# Patient Record
Sex: Female | Born: 1945
Health system: Southern US, Community
[De-identification: ages and names within clinical notes are randomized; demographics above are authoritative.]

## PROBLEM LIST (undated history)

## (undated) DIAGNOSIS — R1310 Dysphagia, unspecified: Secondary | ICD-10-CM

## (undated) DIAGNOSIS — I10 Essential (primary) hypertension: Secondary | ICD-10-CM

## (undated) DIAGNOSIS — Z9889 Other specified postprocedural states: Secondary | ICD-10-CM

## (undated) DIAGNOSIS — K219 Gastro-esophageal reflux disease without esophagitis: Secondary | ICD-10-CM

## (undated) DIAGNOSIS — R7303 Prediabetes: Secondary | ICD-10-CM

## (undated) DIAGNOSIS — R112 Nausea with vomiting, unspecified: Secondary | ICD-10-CM

## (undated) DIAGNOSIS — R7301 Impaired fasting glucose: Secondary | ICD-10-CM

## (undated) DIAGNOSIS — Z923 Personal history of irradiation: Secondary | ICD-10-CM

## (undated) DIAGNOSIS — C159 Malignant neoplasm of esophagus, unspecified: Secondary | ICD-10-CM

## (undated) DIAGNOSIS — E785 Hyperlipidemia, unspecified: Secondary | ICD-10-CM

## (undated) DIAGNOSIS — C50919 Malignant neoplasm of unspecified site of unspecified female breast: Secondary | ICD-10-CM

## (undated) HISTORY — PX: DIRECT LARYNGOSCOPY WITH BOTOX INJECTION: SHX5327

## (undated) HISTORY — DX: Gastro-esophageal reflux disease without esophagitis: K21.9

## (undated) HISTORY — PX: EYE SURGERY: SHX253

## (undated) HISTORY — DX: Hyperlipidemia, unspecified: E78.5

## (undated) HISTORY — DX: Essential (primary) hypertension: I10

## (undated) HISTORY — DX: Impaired fasting glucose: R73.01

## (undated) HISTORY — PX: ESOPHAGUS SURGERY: SHX626

## (undated) HISTORY — DX: Malignant neoplasm of esophagus, unspecified: C15.9

---

## 2011-01-14 ENCOUNTER — Other Ambulatory Visit: Payer: Self-pay | Admitting: Hematology & Oncology

## 2011-01-14 ENCOUNTER — Ambulatory Visit (HOSPITAL_BASED_OUTPATIENT_CLINIC_OR_DEPARTMENT_OTHER): Payer: Medicare Other | Admitting: Hematology & Oncology

## 2011-01-14 ENCOUNTER — Ambulatory Visit (HOSPITAL_BASED_OUTPATIENT_CLINIC_OR_DEPARTMENT_OTHER)
Admission: RE | Admit: 2011-01-14 | Discharge: 2011-01-14 | Disposition: A | Discharge status: Home / Self Care | Payer: Medicare Other | Source: Ambulatory Visit | Attending: Hematology & Oncology | Admitting: Hematology & Oncology

## 2011-01-14 DIAGNOSIS — C155 Malignant neoplasm of lower third of esophagus: Secondary | ICD-10-CM

## 2011-01-14 DIAGNOSIS — N838 Other noninflammatory disorders of ovary, fallopian tube and broad ligament: Secondary | ICD-10-CM

## 2011-01-14 DIAGNOSIS — C159 Malignant neoplasm of esophagus, unspecified: Secondary | ICD-10-CM | POA: Insufficient documentation

## 2011-01-14 DIAGNOSIS — N839 Noninflammatory disorder of ovary, fallopian tube and broad ligament, unspecified: Secondary | ICD-10-CM

## 2011-01-14 DIAGNOSIS — C160 Malignant neoplasm of cardia: Secondary | ICD-10-CM

## 2011-01-14 LAB — CBC WITH DIFFERENTIAL (CANCER CENTER ONLY)
BASO#: 0 10*3/uL (ref 0.0–0.2)
BASO%: 0.3 % (ref 0.0–2.0)
EOS%: 1.1 % (ref 0.0–7.0)
Eosinophils Absolute: 0.1 10*3/uL (ref 0.0–0.5)
HCT: 34.3 % — ABNORMAL LOW (ref 34.8–46.6)
HGB: 11.1 g/dL — ABNORMAL LOW (ref 11.6–15.9)
LYMPH#: 1.8 10*3/uL (ref 0.9–3.3)
LYMPH%: 19.2 % (ref 14.0–48.0)
MCH: 26.6 pg (ref 26.0–34.0)
MCHC: 32.4 g/dL (ref 32.0–36.0)
MCV: 82 fL (ref 81–101)
MONO#: 1 10*3/uL — ABNORMAL HIGH (ref 0.1–0.9)
MONO%: 10.5 % (ref 0.0–13.0)
NEUT#: 6.5 10*3/uL (ref 1.5–6.5)
NEUT%: 68.9 % (ref 39.6–80.0)
Platelets: 353 10*3/uL (ref 145–400)
RBC: 4.17 10*6/uL (ref 3.70–5.32)
RDW: 15.2 % (ref 11.1–15.7)
WBC: 9.5 10*3/uL (ref 3.9–10.0)

## 2011-01-14 LAB — COMPREHENSIVE METABOLIC PANEL
ALT: 16 U/L (ref 0–35)
AST: 19 U/L (ref 0–37)
Albumin: 4.5 g/dL (ref 3.5–5.2)
Alkaline Phosphatase: 50 U/L (ref 39–117)
BUN: 19 mg/dL (ref 6–23)
CO2: 26 mEq/L (ref 19–32)
Calcium: 10.3 mg/dL (ref 8.4–10.5)
Chloride: 101 mEq/L (ref 96–112)
Creatinine, Ser: 0.89 mg/dL (ref 0.40–1.20)
Glucose, Bld: 127 mg/dL — ABNORMAL HIGH (ref 70–99)
Potassium: 3.9 mEq/L (ref 3.5–5.3)
Sodium: 137 mEq/L (ref 135–145)
Total Bilirubin: 0.3 mg/dL (ref 0.3–1.2)
Total Protein: 7.5 g/dL (ref 6.0–8.3)

## 2011-01-14 LAB — PREALBUMIN: Prealbumin: 26.1 mg/dL (ref 17.0–34.0)

## 2011-01-14 LAB — CA 125: CA 125: 12.6 U/mL (ref 0.0–30.2)

## 2011-01-14 LAB — LACTATE DEHYDROGENASE: LDH: 178 U/L (ref 94–250)

## 2011-01-14 LAB — CEA: CEA: 0.7 ng/mL (ref 0.0–5.0)

## 2011-01-20 ENCOUNTER — Encounter (HOSPITAL_COMMUNITY): Payer: Self-pay

## 2011-01-20 ENCOUNTER — Encounter (HOSPITAL_COMMUNITY)
Admission: RE | Admit: 2011-01-20 | Discharge: 2011-01-20 | Disposition: A | Discharge status: Home / Self Care | Payer: Medicare Other | Source: Ambulatory Visit | Attending: Hematology & Oncology | Admitting: Hematology & Oncology

## 2011-01-20 DIAGNOSIS — C159 Malignant neoplasm of esophagus, unspecified: Secondary | ICD-10-CM | POA: Insufficient documentation

## 2011-01-20 LAB — GLUCOSE, CAPILLARY: Glucose-Capillary: 118 mg/dL — ABNORMAL HIGH (ref 70–99)

## 2011-01-20 MED ORDER — FLUDEOXYGLUCOSE F - 18 (FDG) INJECTION
16.1000 | Freq: Once | INTRAVENOUS | Status: AC | PRN
  Administered 2011-01-20: 16.1 via INTRAVENOUS

## 2011-01-22 ENCOUNTER — Encounter (HOSPITAL_BASED_OUTPATIENT_CLINIC_OR_DEPARTMENT_OTHER): Payer: Medicare Other | Admitting: Oncology

## 2011-01-22 DIAGNOSIS — C155 Malignant neoplasm of lower third of esophagus: Secondary | ICD-10-CM

## 2011-01-27 ENCOUNTER — Telehealth: Payer: Self-pay

## 2011-01-27 DIAGNOSIS — C159 Malignant neoplasm of esophagus, unspecified: Secondary | ICD-10-CM

## 2011-01-27 NOTE — Telephone Encounter (Signed)
Spoke with the pt and she is aware of the EUS and meds were reviewed.  The instructions mistakenly was noted as being on Thurs but the pt ws instructed to arrive on 01/30/11 Friday.  She will call with any further questions

## 2011-01-27 NOTE — Telephone Encounter (Signed)
Pt scheduled for EUS need to instruct and review meds

## 2011-01-29 ENCOUNTER — Ambulatory Visit: Payer: Medicare Other | Attending: Radiation Oncology | Admitting: Radiation Oncology

## 2011-01-29 DIAGNOSIS — R1110 Vomiting, unspecified: Secondary | ICD-10-CM | POA: Insufficient documentation

## 2011-01-29 DIAGNOSIS — T63481A Toxic effect of venom of other arthropod, accidental (unintentional), initial encounter: Secondary | ICD-10-CM | POA: Insufficient documentation

## 2011-01-29 DIAGNOSIS — Z510 Encounter for antineoplastic radiation therapy: Secondary | ICD-10-CM | POA: Insufficient documentation

## 2011-01-29 DIAGNOSIS — C155 Malignant neoplasm of lower third of esophagus: Secondary | ICD-10-CM | POA: Insufficient documentation

## 2011-01-29 DIAGNOSIS — R21 Rash and other nonspecific skin eruption: Secondary | ICD-10-CM | POA: Insufficient documentation

## 2011-01-29 DIAGNOSIS — F411 Generalized anxiety disorder: Secondary | ICD-10-CM | POA: Insufficient documentation

## 2011-01-29 DIAGNOSIS — T6391XA Toxic effect of contact with unspecified venomous animal, accidental (unintentional), initial encounter: Secondary | ICD-10-CM | POA: Insufficient documentation

## 2011-01-30 ENCOUNTER — Encounter: Payer: Self-pay | Admitting: Gastroenterology

## 2011-01-30 ENCOUNTER — Ambulatory Visit (HOSPITAL_COMMUNITY)
Admission: RE | Admit: 2011-01-30 | Discharge: 2011-01-30 | Disposition: A | Discharge status: Home / Self Care | Payer: Medicare Other | Source: Ambulatory Visit | Attending: Gastroenterology | Admitting: Gastroenterology

## 2011-01-30 ENCOUNTER — Encounter: Payer: Medicare Other | Admitting: Gastroenterology

## 2011-01-30 DIAGNOSIS — C159 Malignant neoplasm of esophagus, unspecified: Secondary | ICD-10-CM

## 2011-01-30 DIAGNOSIS — K449 Diaphragmatic hernia without obstruction or gangrene: Secondary | ICD-10-CM

## 2011-01-30 DIAGNOSIS — Z79899 Other long term (current) drug therapy: Secondary | ICD-10-CM | POA: Insufficient documentation

## 2011-01-30 DIAGNOSIS — C155 Malignant neoplasm of lower third of esophagus: Secondary | ICD-10-CM | POA: Insufficient documentation

## 2011-02-02 ENCOUNTER — Encounter (HOSPITAL_BASED_OUTPATIENT_CLINIC_OR_DEPARTMENT_OTHER): Payer: Medicare Other | Admitting: Oncology

## 2011-02-02 DIAGNOSIS — R1310 Dysphagia, unspecified: Secondary | ICD-10-CM

## 2011-02-02 DIAGNOSIS — D500 Iron deficiency anemia secondary to blood loss (chronic): Secondary | ICD-10-CM

## 2011-02-02 DIAGNOSIS — K922 Gastrointestinal hemorrhage, unspecified: Secondary | ICD-10-CM

## 2011-02-02 DIAGNOSIS — C160 Malignant neoplasm of cardia: Secondary | ICD-10-CM

## 2011-02-03 NOTE — Procedures (Signed)
Summary: Endoscopic Ultrasound  Patient: Briana Briana Craig Briana Craig Note: All result statuses are Final unless otherwise noted.  Tests: (1) Endoscopic Ultrasound (EUS)  EUS Endoscopic Ultrasound                             DONE     [facility]     [address] [place], [place]  [address]          ENDOSCOPIC ULTRASOUND PROCEDURE REPORT          PATIENT:  Briana Briana Craig, Briana Craig  MR#:  [id number]     BIRTHDATE:  [birth year]  GENDER:  female     ENDOSCOPIST:  Rachael Fee, MD     REFERRED BY:  Lavada Mesi. Truett Perna, M.D.     PROCEDURE DATE:  01/30/2011     PROCEDURE:  Upper EUS     ASA CLASS:  Class II     INDICATIONS:  recently diagnosed esophageal adenocarcinoma by EGD     Dr. Nance Pew; PET and CT scan show no clear metastatic disease          MEDICATIONS:  Fentanyl 100 mcg IV, Versed 7 mg IV          DESCRIPTION OF PROCEDURE:   After the risks benefits and     alternatives of the procedure were  explained, informed consent     was obtained. The patient was then placed in the left, lateral,     decubitus postion and IV sedation was administered. Throughout the     procedure, the patient's blood pressure, pulse and oxygen     saturations were monitored continuously.  Under direct     visualization, the Pentax EUS Radial [id number] endoscope was     introduced through the mouth and advanced to the stomach body.     Water was used as necessary to provide an acoustic interface.  Upon     completion of the imaging, water was removed and the patient was     sent to the recovery room in satisfactory condition.          <<PROCEDUREIMAGES>>          Endoscopic findings:     1. 8cm long, circumferential, friable, bulky mass in esophagus     from GE junction (36cm) up to 28cm from incisors     2. 2cm hiatal hernia     3. Otherwise normal stomach          EUS findings:     1. The mass above corresponded with a 11mm thick heterogeneous,     predominantly hypoechoic mass that clearly  passes into and through     the muscularis propria layer of the esophageal wall (uT3).     2. The was a round, well circumscribed 10.55mm diameter,     homogeneous lymphnode abutting the esophagus at the proximal     aspect of the mass (uN1).     3. No celiac adenopathy.          Impression:     uT3N1 8cm long, 11mm thick, circumferential esophageal     adenocarcinoma that extends from GE junction to 28cm from     incisors. She will likely benefit from neoadjuvant chemo/xrt.          ______________________________     Rachael Fee, MD          cc: Hal Neer, MD; Mancel Bale,  MD; Nance Pew, MD          n.     eSIGNED:   Rachael Fee at 01/30/2011 12:36 PM          [name], [id number]  Note: An exclamation mark (!) indicates a result that was not dispersed into the flowsheet. Document Creation Date: 01/30/2011 12:47 PM _______________________________________________________________________  (1) Order result status: Final Collection or observation date-time: 01/30/2011 12:19 Requested date-time:  Receipt date-time:  Reported date-time:  Referring Physician:   Ordering Physician: Rob Bunting [phone]) Specimen Source:  Source: Launa Grill Order Number: [id number] Lab site:

## 2011-02-05 ENCOUNTER — Encounter: Payer: Self-pay | Admitting: Gastroenterology

## 2011-02-10 ENCOUNTER — Encounter (HOSPITAL_BASED_OUTPATIENT_CLINIC_OR_DEPARTMENT_OTHER): Payer: Medicare Other | Admitting: Oncology

## 2011-02-10 ENCOUNTER — Other Ambulatory Visit: Payer: Self-pay | Admitting: Oncology

## 2011-02-10 DIAGNOSIS — C155 Malignant neoplasm of lower third of esophagus: Secondary | ICD-10-CM

## 2011-02-10 LAB — COMPREHENSIVE METABOLIC PANEL
ALT: 18 U/L (ref 0–35)
AST: 19 U/L (ref 0–37)
Albumin: 4.3 g/dL (ref 3.5–5.2)
Alkaline Phosphatase: 43 U/L (ref 39–117)
BUN: 21 mg/dL (ref 6–23)
CO2: 26 mEq/L (ref 19–32)
Calcium: 10.2 mg/dL (ref 8.4–10.5)
Chloride: 102 mEq/L (ref 96–112)
Creatinine, Ser: 0.9 mg/dL (ref 0.40–1.20)
Glucose, Bld: 121 mg/dL — ABNORMAL HIGH (ref 70–99)
Potassium: 4.3 mEq/L (ref 3.5–5.3)
Sodium: 138 mEq/L (ref 135–145)
Total Bilirubin: 0.3 mg/dL (ref 0.3–1.2)
Total Protein: 7.2 g/dL (ref 6.0–8.3)

## 2011-02-10 LAB — CEA: CEA: 0.6 ng/mL (ref 0.0–5.0)

## 2011-02-12 LAB — FERRITIN: Ferritin: 14 ng/mL (ref 10–291)

## 2011-02-16 ENCOUNTER — Encounter (HOSPITAL_BASED_OUTPATIENT_CLINIC_OR_DEPARTMENT_OTHER): Payer: Medicare Other | Admitting: Oncology

## 2011-02-16 DIAGNOSIS — C160 Malignant neoplasm of cardia: Secondary | ICD-10-CM

## 2011-02-16 DIAGNOSIS — Z5111 Encounter for antineoplastic chemotherapy: Secondary | ICD-10-CM

## 2011-02-19 ENCOUNTER — Emergency Department (HOSPITAL_COMMUNITY): Payer: Medicare Other

## 2011-02-19 ENCOUNTER — Emergency Department (HOSPITAL_COMMUNITY)
Admission: EM | Admit: 2011-02-19 | Discharge: 2011-02-19 | Disposition: A | Discharge status: Home / Self Care | Payer: Medicare Other | Attending: Emergency Medicine | Admitting: Emergency Medicine

## 2011-02-19 DIAGNOSIS — C159 Malignant neoplasm of esophagus, unspecified: Secondary | ICD-10-CM | POA: Insufficient documentation

## 2011-02-19 DIAGNOSIS — I10 Essential (primary) hypertension: Secondary | ICD-10-CM | POA: Insufficient documentation

## 2011-02-19 DIAGNOSIS — Z79899 Other long term (current) drug therapy: Secondary | ICD-10-CM | POA: Insufficient documentation

## 2011-02-19 DIAGNOSIS — R000 Tachycardia, unspecified: Secondary | ICD-10-CM | POA: Insufficient documentation

## 2011-02-19 DIAGNOSIS — R079 Chest pain, unspecified: Secondary | ICD-10-CM | POA: Insufficient documentation

## 2011-02-19 DIAGNOSIS — M542 Cervicalgia: Secondary | ICD-10-CM | POA: Insufficient documentation

## 2011-02-19 DIAGNOSIS — M899 Disorder of bone, unspecified: Secondary | ICD-10-CM | POA: Insufficient documentation

## 2011-02-19 DIAGNOSIS — K219 Gastro-esophageal reflux disease without esophagitis: Secondary | ICD-10-CM | POA: Insufficient documentation

## 2011-02-19 DIAGNOSIS — F411 Generalized anxiety disorder: Secondary | ICD-10-CM | POA: Insufficient documentation

## 2011-02-19 LAB — DIFFERENTIAL
Basophils Absolute: 0 10*3/uL (ref 0.0–0.1)
Basophils Relative: 0 % (ref 0–1)
Eosinophils Absolute: 0.2 10*3/uL (ref 0.0–0.7)
Eosinophils Relative: 2 % (ref 0–5)
Lymphocytes Relative: 9 % — ABNORMAL LOW (ref 12–46)
Lymphs Abs: 0.8 10*3/uL (ref 0.7–4.0)
Monocytes Absolute: 0.6 10*3/uL (ref 0.1–1.0)
Monocytes Relative: 7 % (ref 3–12)
Neutro Abs: 7.4 10*3/uL (ref 1.7–7.7)
Neutrophils Relative %: 82 % — ABNORMAL HIGH (ref 43–77)

## 2011-02-19 LAB — POCT I-STAT, CHEM 8
BUN: 18 mg/dL (ref 6–23)
Calcium, Ion: 1.24 mmol/L (ref 1.12–1.32)
Chloride: 102 mEq/L (ref 96–112)
Creatinine, Ser: 0.9 mg/dL (ref 0.4–1.2)
Glucose, Bld: 175 mg/dL — ABNORMAL HIGH (ref 70–99)
HCT: 44 % (ref 36.0–46.0)
Hemoglobin: 15 g/dL (ref 12.0–15.0)
Potassium: 3.9 mEq/L (ref 3.5–5.1)
Sodium: 137 mEq/L (ref 135–145)
TCO2: 25 mmol/L (ref 0–100)

## 2011-02-19 LAB — CBC
HCT: 40 % (ref 36.0–46.0)
Hemoglobin: 13.4 g/dL (ref 12.0–15.0)
MCH: 27.3 pg (ref 26.0–34.0)
MCHC: 33.5 g/dL (ref 30.0–36.0)
MCV: 81.6 fL (ref 78.0–100.0)
Platelets: 325 10*3/uL (ref 150–400)
RBC: 4.9 MIL/uL (ref 3.87–5.11)
RDW: 17.1 % — ABNORMAL HIGH (ref 11.5–15.5)
WBC: 9 10*3/uL (ref 4.0–10.5)

## 2011-02-19 LAB — BRAIN NATRIURETIC PEPTIDE: Pro B Natriuretic peptide (BNP): <30 pg/mL (ref 0.0–100.0)

## 2011-02-19 LAB — COMPREHENSIVE METABOLIC PANEL
ALT: 25 U/L (ref 0–35)
AST: 21 U/L (ref 0–37)
Albumin: 3.8 g/dL (ref 3.5–5.2)
Alkaline Phosphatase: 50 U/L (ref 39–117)
BUN: 16 mg/dL (ref 6–23)
CO2: 26 mEq/L (ref 19–32)
Calcium: 9.7 mg/dL (ref 8.4–10.5)
Chloride: 100 mEq/L (ref 96–112)
Creatinine, Ser: 0.78 mg/dL (ref 0.4–1.2)
GFR calc Af Amer: >60 mL/min (ref >60)
GFR calc non Af Amer: >60 mL/min (ref >60)
Glucose, Bld: 166 mg/dL — ABNORMAL HIGH (ref 70–99)
Potassium: 3.8 mEq/L (ref 3.5–5.1)
Sodium: 135 mEq/L (ref 135–145)
Total Bilirubin: 0.7 mg/dL (ref 0.3–1.2)
Total Protein: 7.7 g/dL (ref 6.0–8.3)

## 2011-02-19 LAB — URINALYSIS, ROUTINE W REFLEX MICROSCOPIC
Bilirubin Urine: NEGATIVE
Glucose, UA: NEGATIVE mg/dL
Hgb urine dipstick: NEGATIVE
Ketones, ur: NEGATIVE mg/dL
Nitrite: NEGATIVE
Protein, ur: NEGATIVE mg/dL
Specific Gravity, Urine: 1.011 (ref 1.005–1.030)
Urobilinogen, UA: 0.2 mg/dL (ref 0.0–1.0)
pH: 6.5 (ref 5.0–8.0)

## 2011-02-19 LAB — CK TOTAL AND CKMB (NOT AT ARMC)
CK, MB: 1.2 ng/mL (ref 0.3–4.0)
CK, MB: 1 ng/mL (ref 0.3–4.0)
Relative Index: INVALID (ref 0.0–2.5)
Relative Index: INVALID (ref 0.0–2.5)
Total CK: 32 U/L (ref 7–177)
Total CK: 27 U/L (ref 7–177)

## 2011-02-19 LAB — POCT CARDIAC MARKERS
CKMB, poc: 1.9 ng/mL (ref 1.0–8.0)
Myoglobin, poc: 46.3 ng/mL (ref 12–200)
Troponin i, poc: <0.05 ng/mL (ref 0.00–0.09)

## 2011-02-19 LAB — D-DIMER, QUANTITATIVE: D-Dimer, Quant: 0.79 ug/mL-FEU — ABNORMAL HIGH (ref 0.00–0.48)

## 2011-02-19 LAB — TROPONIN I
Troponin I: 0.01 ng/mL (ref 0.00–0.06)
Troponin I: 0.01 ng/mL (ref 0.00–0.06)

## 2011-02-19 LAB — LIPASE, BLOOD: Lipase: 122 U/L — ABNORMAL HIGH (ref 11–59)

## 2011-02-19 MED ORDER — IOHEXOL 300 MG/ML  SOLN
100.0000 mL | Freq: Once | INTRAMUSCULAR | Status: AC | PRN
  Administered 2011-02-19: 100 mL via INTRAVENOUS

## 2011-02-23 ENCOUNTER — Encounter (HOSPITAL_BASED_OUTPATIENT_CLINIC_OR_DEPARTMENT_OTHER): Payer: Medicare Other | Admitting: Oncology

## 2011-02-23 ENCOUNTER — Other Ambulatory Visit: Payer: Self-pay | Admitting: Oncology

## 2011-02-23 DIAGNOSIS — C155 Malignant neoplasm of lower third of esophagus: Secondary | ICD-10-CM

## 2011-02-23 DIAGNOSIS — Z5111 Encounter for antineoplastic chemotherapy: Secondary | ICD-10-CM

## 2011-02-23 DIAGNOSIS — C160 Malignant neoplasm of cardia: Secondary | ICD-10-CM

## 2011-02-23 DIAGNOSIS — D500 Iron deficiency anemia secondary to blood loss (chronic): Secondary | ICD-10-CM

## 2011-02-23 LAB — CBC WITH DIFFERENTIAL/PLATELET
BASO%: 0.3 % (ref 0.0–2.0)
Basophils Absolute: 0 10*3/uL (ref 0.0–0.1)
EOS%: 5.1 % (ref 0.0–7.0)
Eosinophils Absolute: 0.2 10*3/uL (ref 0.0–0.5)
HCT: 34.9 % (ref 34.8–46.6)
HGB: 11.8 g/dL (ref 11.6–15.9)
LYMPH%: 16.8 % (ref 14.0–49.7)
MCH: 27.1 pg (ref 25.1–34.0)
MCHC: 33.8 g/dL (ref 31.5–36.0)
MCV: 80 fL (ref 79.5–101.0)
MONO#: 0.4 10*3/uL (ref 0.1–0.9)
MONO%: 9.9 % (ref 0.0–14.0)
NEUT#: 2.6 10*3/uL (ref 1.5–6.5)
NEUT%: 67.9 % (ref 38.4–76.8)
Platelets: 262 10*3/uL (ref 145–400)
RBC: 4.36 10*6/uL (ref 3.70–5.45)
RDW: 16.5 % — ABNORMAL HIGH (ref 11.2–14.5)
WBC: 3.8 10*3/uL — ABNORMAL LOW (ref 3.9–10.3)
lymph#: 0.6 10*3/uL — ABNORMAL LOW (ref 0.9–3.3)
nRBC: 0 % (ref 0–0)

## 2011-02-23 LAB — COMPREHENSIVE METABOLIC PANEL
ALT: 21 U/L (ref 0–35)
AST: 23 U/L (ref 0–37)
Albumin: 3.8 g/dL (ref 3.5–5.2)
Alkaline Phosphatase: 42 U/L (ref 39–117)
BUN: 19 mg/dL (ref 6–23)
CO2: 26 mEq/L (ref 19–32)
Calcium: 9.2 mg/dL (ref 8.4–10.5)
Chloride: 103 mEq/L (ref 96–112)
Creatinine, Ser: 0.9 mg/dL (ref 0.40–1.20)
Glucose, Bld: 170 mg/dL — ABNORMAL HIGH (ref 70–99)
Potassium: 3.9 mEq/L (ref 3.5–5.3)
Sodium: 136 mEq/L (ref 135–145)
Total Bilirubin: 0.7 mg/dL (ref 0.3–1.2)
Total Protein: 7.3 g/dL (ref 6.0–8.3)

## 2011-02-26 ENCOUNTER — Encounter: Payer: Medicare Other | Admitting: Cardiothoracic Surgery

## 2011-03-02 ENCOUNTER — Other Ambulatory Visit: Payer: Self-pay | Admitting: Oncology

## 2011-03-02 ENCOUNTER — Encounter (HOSPITAL_BASED_OUTPATIENT_CLINIC_OR_DEPARTMENT_OTHER): Payer: Medicare Other | Admitting: Oncology

## 2011-03-02 DIAGNOSIS — Z5111 Encounter for antineoplastic chemotherapy: Secondary | ICD-10-CM

## 2011-03-02 DIAGNOSIS — N839 Noninflammatory disorder of ovary, fallopian tube and broad ligament, unspecified: Secondary | ICD-10-CM

## 2011-03-02 DIAGNOSIS — C160 Malignant neoplasm of cardia: Secondary | ICD-10-CM

## 2011-03-02 DIAGNOSIS — C155 Malignant neoplasm of lower third of esophagus: Secondary | ICD-10-CM

## 2011-03-02 LAB — CBC WITH DIFFERENTIAL/PLATELET
BASO%: 0.4 % (ref 0.0–2.0)
Basophils Absolute: 0 10*3/uL (ref 0.0–0.1)
EOS%: 5.1 % (ref 0.0–7.0)
Eosinophils Absolute: 0.1 10*3/uL (ref 0.0–0.5)
HCT: 34.4 % — ABNORMAL LOW (ref 34.8–46.6)
HGB: 11.4 g/dL — ABNORMAL LOW (ref 11.6–15.9)
LYMPH%: 18 % (ref 14.0–49.7)
MCH: 27.1 pg (ref 25.1–34.0)
MCHC: 33.1 g/dL (ref 31.5–36.0)
MCV: 81.7 fL (ref 79.5–101.0)
MONO#: 0.4 10*3/uL (ref 0.1–0.9)
MONO%: 15.7 % — ABNORMAL HIGH (ref 0.0–14.0)
NEUT#: 1.6 10*3/uL (ref 1.5–6.5)
NEUT%: 60.8 % (ref 38.4–76.8)
Platelets: 198 10*3/uL (ref 145–400)
RBC: 4.21 10*6/uL (ref 3.70–5.45)
RDW: 16.8 % — ABNORMAL HIGH (ref 11.2–14.5)
WBC: 2.6 10*3/uL — ABNORMAL LOW (ref 3.9–10.3)
lymph#: 0.5 10*3/uL — ABNORMAL LOW (ref 0.9–3.3)
nRBC: 0 % (ref 0–0)

## 2011-03-09 ENCOUNTER — Encounter (HOSPITAL_BASED_OUTPATIENT_CLINIC_OR_DEPARTMENT_OTHER): Payer: Medicare Other | Admitting: Oncology

## 2011-03-09 ENCOUNTER — Other Ambulatory Visit: Payer: Self-pay | Admitting: Oncology

## 2011-03-09 DIAGNOSIS — N839 Noninflammatory disorder of ovary, fallopian tube and broad ligament, unspecified: Secondary | ICD-10-CM

## 2011-03-09 DIAGNOSIS — Z5111 Encounter for antineoplastic chemotherapy: Secondary | ICD-10-CM

## 2011-03-09 DIAGNOSIS — D500 Iron deficiency anemia secondary to blood loss (chronic): Secondary | ICD-10-CM

## 2011-03-09 DIAGNOSIS — C160 Malignant neoplasm of cardia: Secondary | ICD-10-CM

## 2011-03-09 DIAGNOSIS — C155 Malignant neoplasm of lower third of esophagus: Secondary | ICD-10-CM

## 2011-03-09 LAB — CBC WITH DIFFERENTIAL/PLATELET
BASO%: 0.2 % (ref 0.0–2.0)
Basophils Absolute: 0 10*3/uL (ref 0.0–0.1)
EOS%: 2.8 % (ref 0.0–7.0)
Eosinophils Absolute: 0.1 10*3/uL (ref 0.0–0.5)
HCT: 36.4 % (ref 34.8–46.6)
HGB: 12.3 g/dL (ref 11.6–15.9)
LYMPH%: 14.3 % (ref 14.0–49.7)
MCH: 27.5 pg (ref 25.1–34.0)
MCHC: 33.8 g/dL (ref 31.5–36.0)
MCV: 81.4 fL (ref 79.5–101.0)
MONO#: 0.4 10*3/uL (ref 0.1–0.9)
MONO%: 9.9 % (ref 0.0–14.0)
NEUT#: 3.2 10*3/uL (ref 1.5–6.5)
NEUT%: 72.8 % (ref 38.4–76.8)
Platelets: 156 10*3/uL (ref 145–400)
RBC: 4.47 10*6/uL (ref 3.70–5.45)
RDW: 17.2 % — ABNORMAL HIGH (ref 11.2–14.5)
WBC: 4.4 10*3/uL (ref 3.9–10.3)
lymph#: 0.6 10*3/uL — ABNORMAL LOW (ref 0.9–3.3)
nRBC: 0 % (ref 0–0)

## 2011-03-09 LAB — COMPREHENSIVE METABOLIC PANEL
ALT: 36 U/L — ABNORMAL HIGH (ref 0–35)
AST: 31 U/L (ref 0–37)
Albumin: 3.6 g/dL (ref 3.5–5.2)
Alkaline Phosphatase: 48 U/L (ref 39–117)
BUN: 15 mg/dL (ref 6–23)
CO2: 26 mEq/L (ref 19–32)
Calcium: 9.6 mg/dL (ref 8.4–10.5)
Chloride: 101 mEq/L (ref 96–112)
Creatinine, Ser: 0.69 mg/dL (ref 0.40–1.20)
Glucose, Bld: 136 mg/dL — ABNORMAL HIGH (ref 70–99)
Potassium: 3.6 mEq/L (ref 3.5–5.3)
Sodium: 134 mEq/L — ABNORMAL LOW (ref 135–145)
Total Bilirubin: 0.3 mg/dL (ref 0.3–1.2)
Total Protein: 7.5 g/dL (ref 6.0–8.3)

## 2011-03-16 ENCOUNTER — Encounter (HOSPITAL_BASED_OUTPATIENT_CLINIC_OR_DEPARTMENT_OTHER): Payer: Medicare Other | Admitting: Oncology

## 2011-03-16 ENCOUNTER — Other Ambulatory Visit: Payer: Self-pay | Admitting: Oncology

## 2011-03-16 DIAGNOSIS — N839 Noninflammatory disorder of ovary, fallopian tube and broad ligament, unspecified: Secondary | ICD-10-CM

## 2011-03-16 DIAGNOSIS — C155 Malignant neoplasm of lower third of esophagus: Secondary | ICD-10-CM

## 2011-03-16 DIAGNOSIS — Z5111 Encounter for antineoplastic chemotherapy: Secondary | ICD-10-CM

## 2011-03-16 DIAGNOSIS — C160 Malignant neoplasm of cardia: Secondary | ICD-10-CM

## 2011-03-16 DIAGNOSIS — D509 Iron deficiency anemia, unspecified: Secondary | ICD-10-CM

## 2011-03-16 LAB — CBC WITH DIFFERENTIAL/PLATELET
BASO%: 1 % (ref 0.0–2.0)
Basophils Absolute: 0 10*3/uL (ref 0.0–0.1)
EOS%: 5.1 % (ref 0.0–7.0)
Eosinophils Absolute: 0.2 10*3/uL (ref 0.0–0.5)
HCT: 36.2 % (ref 34.8–46.6)
HGB: 12.3 g/dL (ref 11.6–15.9)
LYMPH%: 17 % (ref 14.0–49.7)
MCH: 27.5 pg (ref 25.1–34.0)
MCHC: 34 g/dL (ref 31.5–36.0)
MCV: 80.8 fL (ref 79.5–101.0)
MONO#: 0.5 10*3/uL (ref 0.1–0.9)
MONO%: 16.7 % — ABNORMAL HIGH (ref 0.0–14.0)
NEUT#: 1.8 10*3/uL (ref 1.5–6.5)
NEUT%: 60.2 % (ref 38.4–76.8)
Platelets: 109 10*3/uL — ABNORMAL LOW (ref 145–400)
RBC: 4.48 10*6/uL (ref 3.70–5.45)
RDW: 17 % — ABNORMAL HIGH (ref 11.2–14.5)
WBC: 2.9 10*3/uL — ABNORMAL LOW (ref 3.9–10.3)
lymph#: 0.5 10*3/uL — ABNORMAL LOW (ref 0.9–3.3)
nRBC: 0 % (ref 0–0)

## 2011-03-20 NOTE — Consult Note (Signed)
Briana Craig NO.:  [id number]  MEDICAL RECORD NO.:  [id number]           PATIENT TYPE:  E  LOCATION:  WLED                         FACILITY:  [facility]  PHYSICIAN:  Andreas Blower, MD       DATE OF BIRTH:  09-28-1946  DATE OF CONSULTATION:  02/19/2011 DATE OF DISCHARGE:                                CONSULTATION   PRIMARY CARE PHYSICIAN:  At [facility].  ONCOLOGIST:  Dr. Rolm Baptise.  REASON FOR CONSULTATION:  Chest pain.  HISTORY OF PRESENT ILLNESS:  Briana Craig is a 65 year old female, currently undergoing treatment for esophageal cancer who presented to [facility] Emergency Room today with complaints of chest pain.  The patient states she woke at approximately midnight last evening with midsternal chest "tightness."  The patient states pain radiated to the right side of her chest, her right side of her back and both sides of her neck.  The patient denied any associated nausea, vomiting, diaphoresis, or shortness of breath.  She denies any recent abdominal pain.  The patient states the pain lasted approximately 10 minutes and relieved without any intervention.  The patient mentioned episode to oncologist who sent her here for further evaluation.  Of note, the patient is currently undergoing weekly chemotherapy on Mondays as well as radiation, Monday through Friday for esophageal cancer.  Risk factors for acute coronary syndrome include hypertension and hypercholesterolemia.  PAST MEDICAL HISTORY: 1. Hypertension. 2. Esophageal cancer, currently undergoing chemotherapy and radiation. 3. Hypercholesterolemia. 4. Hyperglycemia. 5. GERD. 6. Osteopenia.  MEDICATIONS: 1. Ferrous sulfate 325 mg p.o. daily. 2. Protonix 40 mg p.o. daily. 3. Senokot 1 to 2 tablets p.o. q.h.s. 4. Omega-3 acid over-the-counter p.o. daily. 5. Calcium 500 mg p.o. b.i.d. 6. Lipitor 20 mg p.o. q.h.s. 7. Losartan/HCTZ 100/12.5 mg p.o. q.a.m. 8.  Multivitamins p.o. daily. 9. CoQ10 enzyme p.o. daily.  ALLERGIES: 1. CODEINE. 2. PENICILLIN.  FAMILY HISTORY:  Father deceased at age 60 with COPD and colon cancer. Mother deceased at age 40 with abdominal aortic aneurysm.  The patient has 1 brother with hypertension.  She denies any family history of coronary disease.  SOCIAL HISTORY:  The patient is married.  She is currently retired.  She and her husband just recently moved to the area from [place].  She has a remote history of tobacco abuse.  She denies any EtOH use.  REVIEW OF SYSTEMS:  As stated in HPI, otherwise negative.  PHYSICAL EXAMINATION:  VITAL SIGNS:  Blood pressure 124/80, heart rate 91, respirations 18, temperature 98.0, and O2 saturation is 95% on room air. GENERAL:  This is a well-nourished and well-developed Caucasian female, sitting on side of bed in no acute distress. HEAD:  Normocephalic and atraumatic. EYES:  Extraocular movements are intact without scleral icterus or injection. EARS, NOSE, AND THROAT:  Mucous membranes are moist.  No oropharyngeal lesions are noted. NECK:  Supple with no thyromegaly or lymphadenopathy.  No JVD or carotid bruits. CHEST:  With symmetrical movement, nontender to palpation. CARDIOVASCULAR:  S1 and S2, regular rate and rhythm.  No murmur, rub, or gallop.  No lower  extremity edema. RESPIRATORY:  Lungs sounds are clear to auscultation bilaterally.  No wheezes, rales, or crackles.  No increased work of breathing. GI:  Abdomen is soft, nontender, and nondistended with positive bowel sounds.  No appreciated masses or hepatosplenomegaly. NEUROLOGICAL:  The patient is able to move all extremities x4 without motor sensory deficit on exam. PSYCHOLOGICAL:  The patient is alert and oriented x4 with very pleasant mood and affect.  PERTINENT LABORATORY DATA:  White cell count 9.0, platelet count 325, hemoglobin 13.4, and hematocrit 40.0.  Sodium 135, potassium 3.8, chloride 100, CO2  of 26, BUN 16, creatinine 0.78, and glucose 166. Liver function tests within normal limits.  Albumin 3.8.  Point of care marker negative x1.  BNP less than 30.  Lipase 122.  Chest x-ray with no acute findings.  EKG showing sinus rhythm at 103 beats per minute with no ischemic changes.  CT angio of the chest is negative for pulmonary emboli, small nodules in the right middle lobe seen previously on recent PET scan.  ASSESSMENT AND PLAN: 1. Chest pain.  Again, the patient presents with both typical and     atypical symptoms for acute coronary syndrome.  Risk factors     include hypertension and hypercholesterolemia.  The patient's TIMI     score is quite low.  As mentioned above, EKG and cardiac point of     care markers are unremarkable at the time of admission evaluation.     Given the patient's history of cancer, it was asked that the D-     dimer and subsequent CT angio of the chest was checked to rule out     pulmonary embolism.  These tests have been negative at this time.     The patient's pain occurred approximately 12 hours prior to     admission evaluation; therefore, I would expect elevated troponin     should the patient be experiencing an ischemic event. We will  ask emergency room staff to check a set of cardiac enzymes now and     again in 6 hours to determine if any elevation.  If workup done in     the emergency room remains negative, the patient is felt stable for     discharge home with followup with primary care physician.  The     patient has been instructed to follow up with her primary care     physician in approximately 1 to 2 weeks for possible outpatient 2-D     echocardiogram.  The patient's symptoms could be related to pain     from the esophageal mass and current radiation therapy. 2. Esophageal cancer.  Continue regimen per Dr. Truett Perna. 3. History of hypertension has remained stable.  Continue medications     as prior to this evaluation. 4. Elevated  lipase.  Suspect related to chemotherapy agent.  The     patient has not had any recent nausea, vomiting, or abdominal pain. 5. Disposition.  At this time, second set of cardiac enzymes are     pending.  Again, the patient is felt medically stable for discharge     home should second set of enzymes be negative.  The patient is     instructed to follow with primary care physician in 1 to 2 weeks.     The patient and husband verify understanding of these instructions.     Cordelia Pen, NP   ______________________________ Andreas Blower, MD    LE/MEDQ  D:  02/19/2011  T:  02/19/2011  Job:  [id number]  cc:   [facility].  Electronically Signed by Cordelia Pen NP on 03/19/2011 02:23:40 PM Electronically Signed by Wardell Heath Rawn Quiroa  on 03/20/2011 08:17:00 PM

## 2011-03-23 ENCOUNTER — Encounter (HOSPITAL_BASED_OUTPATIENT_CLINIC_OR_DEPARTMENT_OTHER): Payer: Medicare Other | Admitting: Oncology

## 2011-03-23 ENCOUNTER — Other Ambulatory Visit: Payer: Self-pay | Admitting: Oncology

## 2011-03-23 DIAGNOSIS — D6959 Other secondary thrombocytopenia: Secondary | ICD-10-CM

## 2011-03-23 DIAGNOSIS — R1310 Dysphagia, unspecified: Secondary | ICD-10-CM

## 2011-03-23 DIAGNOSIS — D500 Iron deficiency anemia secondary to blood loss (chronic): Secondary | ICD-10-CM

## 2011-03-23 DIAGNOSIS — C160 Malignant neoplasm of cardia: Secondary | ICD-10-CM

## 2011-03-23 DIAGNOSIS — D509 Iron deficiency anemia, unspecified: Secondary | ICD-10-CM

## 2011-03-23 DIAGNOSIS — C155 Malignant neoplasm of lower third of esophagus: Secondary | ICD-10-CM

## 2011-03-23 DIAGNOSIS — N839 Noninflammatory disorder of ovary, fallopian tube and broad ligament, unspecified: Secondary | ICD-10-CM

## 2011-03-23 LAB — CBC WITH DIFFERENTIAL/PLATELET
BASO%: 0.4 % (ref 0.0–2.0)
Basophils Absolute: 0 10*3/uL (ref 0.0–0.1)
EOS%: 2.4 % (ref 0.0–7.0)
Eosinophils Absolute: 0.1 10*3/uL (ref 0.0–0.5)
HCT: 36.4 % (ref 34.8–46.6)
HGB: 12.6 g/dL (ref 11.6–15.9)
LYMPH%: 10.8 % — ABNORMAL LOW (ref 14.0–49.7)
MCH: 27.9 pg (ref 25.1–34.0)
MCHC: 34.6 g/dL (ref 31.5–36.0)
MCV: 80.5 fL (ref 79.5–101.0)
MONO#: 0.3 10*3/uL (ref 0.1–0.9)
MONO%: 13.3 % (ref 0.0–14.0)
NEUT#: 1.8 10*3/uL (ref 1.5–6.5)
NEUT%: 73.1 % (ref 38.4–76.8)
Platelets: 73 10*3/uL — ABNORMAL LOW (ref 145–400)
RBC: 4.52 10*6/uL (ref 3.70–5.45)
RDW: 17.2 % — ABNORMAL HIGH (ref 11.2–14.5)
WBC: 2.5 10*3/uL — ABNORMAL LOW (ref 3.9–10.3)
lymph#: 0.3 10*3/uL — ABNORMAL LOW (ref 0.9–3.3)
nRBC: 0 % (ref 0–0)

## 2011-03-25 ENCOUNTER — Other Ambulatory Visit: Payer: Self-pay | Admitting: Oncology

## 2011-03-25 ENCOUNTER — Encounter: Payer: Medicare Other | Admitting: Oncology

## 2011-03-25 LAB — CBC WITH DIFFERENTIAL/PLATELET
BASO%: 0.4 % (ref 0.0–2.0)
Basophils Absolute: 0 10*3/uL (ref 0.0–0.1)
EOS%: 2.1 % (ref 0.0–7.0)
Eosinophils Absolute: 0.1 10*3/uL (ref 0.0–0.5)
HCT: 33.2 % — ABNORMAL LOW (ref 34.8–46.6)
HGB: 11.5 g/dL — ABNORMAL LOW (ref 11.6–15.9)
LYMPH%: 18.5 % (ref 14.0–49.7)
MCH: 27.9 pg (ref 25.1–34.0)
MCHC: 34.6 g/dL (ref 31.5–36.0)
MCV: 80.6 fL (ref 79.5–101.0)
MONO#: 0.3 10*3/uL (ref 0.1–0.9)
MONO%: 14.2 % — ABNORMAL HIGH (ref 0.0–14.0)
NEUT#: 1.5 10*3/uL (ref 1.5–6.5)
NEUT%: 64.8 % (ref 38.4–76.8)
Platelets: 73 10*3/uL — ABNORMAL LOW (ref 145–400)
RBC: 4.12 10*6/uL (ref 3.70–5.45)
RDW: 17.3 % — ABNORMAL HIGH (ref 11.2–14.5)
WBC: 2.3 10*3/uL — ABNORMAL LOW (ref 3.9–10.3)
lymph#: 0.4 10*3/uL — ABNORMAL LOW (ref 0.9–3.3)
nRBC: 0 % (ref 0–0)

## 2011-04-02 ENCOUNTER — Encounter (HOSPITAL_BASED_OUTPATIENT_CLINIC_OR_DEPARTMENT_OTHER): Payer: Medicare Other | Admitting: Oncology

## 2011-04-02 ENCOUNTER — Other Ambulatory Visit: Payer: Self-pay | Admitting: Oncology

## 2011-04-02 DIAGNOSIS — Z5111 Encounter for antineoplastic chemotherapy: Secondary | ICD-10-CM

## 2011-04-02 DIAGNOSIS — C155 Malignant neoplasm of lower third of esophagus: Secondary | ICD-10-CM

## 2011-04-02 LAB — CBC WITH DIFFERENTIAL/PLATELET
BASO%: 0.5 % (ref 0.0–2.0)
Basophils Absolute: 0 10*3/uL (ref 0.0–0.1)
EOS%: 3.3 % (ref 0.0–7.0)
Eosinophils Absolute: 0.1 10*3/uL (ref 0.0–0.5)
HCT: 34.9 % (ref 34.8–46.6)
HGB: 12 g/dL (ref 11.6–15.9)
LYMPH%: 26.6 % (ref 14.0–49.7)
MCH: 28.2 pg (ref 25.1–34.0)
MCHC: 34.4 g/dL (ref 31.5–36.0)
MCV: 81.9 fL (ref 79.5–101.0)
MONO#: 0.5 10*3/uL (ref 0.1–0.9)
MONO%: 28.3 % — ABNORMAL HIGH (ref 0.0–14.0)
NEUT#: 0.8 10*3/uL — ABNORMAL LOW (ref 1.5–6.5)
NEUT%: 41.3 % (ref 38.4–76.8)
Platelets: 114 10*3/uL — ABNORMAL LOW (ref 145–400)
RBC: 4.26 10*6/uL (ref 3.70–5.45)
RDW: 18.7 % — ABNORMAL HIGH (ref 11.2–14.5)
WBC: 1.8 10*3/uL — ABNORMAL LOW (ref 3.9–10.3)
lymph#: 0.5 10*3/uL — ABNORMAL LOW (ref 0.9–3.3)

## 2011-04-08 ENCOUNTER — Other Ambulatory Visit: Payer: Self-pay | Admitting: Oncology

## 2011-04-08 ENCOUNTER — Encounter (HOSPITAL_BASED_OUTPATIENT_CLINIC_OR_DEPARTMENT_OTHER): Payer: Medicare Other | Admitting: Oncology

## 2011-04-08 DIAGNOSIS — D509 Iron deficiency anemia, unspecified: Secondary | ICD-10-CM

## 2011-04-08 DIAGNOSIS — C160 Malignant neoplasm of cardia: Secondary | ICD-10-CM

## 2011-04-08 DIAGNOSIS — N839 Noninflammatory disorder of ovary, fallopian tube and broad ligament, unspecified: Secondary | ICD-10-CM

## 2011-04-08 DIAGNOSIS — D6959 Other secondary thrombocytopenia: Secondary | ICD-10-CM

## 2011-04-08 DIAGNOSIS — I10 Essential (primary) hypertension: Secondary | ICD-10-CM

## 2011-04-08 DIAGNOSIS — D500 Iron deficiency anemia secondary to blood loss (chronic): Secondary | ICD-10-CM

## 2011-04-08 DIAGNOSIS — C155 Malignant neoplasm of lower third of esophagus: Secondary | ICD-10-CM

## 2011-04-08 LAB — CBC WITH DIFFERENTIAL/PLATELET
BASO%: 0.2 % (ref 0.0–2.0)
Basophils Absolute: 0 10*3/uL (ref 0.0–0.1)
EOS%: 1.5 % (ref 0.0–7.0)
Eosinophils Absolute: 0.1 10*3/uL (ref 0.0–0.5)
HCT: 33.5 % — ABNORMAL LOW (ref 34.8–46.6)
HGB: 11.7 g/dL (ref 11.6–15.9)
LYMPH%: 32.9 % (ref 14.0–49.7)
MCH: 28.6 pg (ref 25.1–34.0)
MCHC: 34.9 g/dL (ref 31.5–36.0)
MCV: 81.9 fL (ref 79.5–101.0)
MONO#: 1.3 10*3/uL — ABNORMAL HIGH (ref 0.1–0.9)
MONO%: 23.8 % — ABNORMAL HIGH (ref 0.0–14.0)
NEUT#: 2.3 10*3/uL (ref 1.5–6.5)
NEUT%: 41.6 % (ref 38.4–76.8)
Platelets: 177 10*3/uL (ref 145–400)
RBC: 4.09 10*6/uL (ref 3.70–5.45)
RDW: 19.6 % — ABNORMAL HIGH (ref 11.2–14.5)
WBC: 5.4 10*3/uL (ref 3.9–10.3)
lymph#: 1.8 10*3/uL (ref 0.9–3.3)

## 2011-04-23 ENCOUNTER — Ambulatory Visit
Admission: RE | Admit: 2011-04-23 | Discharge: 2011-04-23 | Disposition: A | Discharge status: Home / Self Care | Payer: Medicare Other | Source: Ambulatory Visit | Attending: Radiation Oncology | Admitting: Radiation Oncology

## 2011-06-15 ENCOUNTER — Encounter (HOSPITAL_BASED_OUTPATIENT_CLINIC_OR_DEPARTMENT_OTHER): Payer: Medicare Other | Admitting: Oncology

## 2011-06-15 DIAGNOSIS — C160 Malignant neoplasm of cardia: Secondary | ICD-10-CM

## 2011-06-15 DIAGNOSIS — D500 Iron deficiency anemia secondary to blood loss (chronic): Secondary | ICD-10-CM

## 2011-06-15 DIAGNOSIS — I10 Essential (primary) hypertension: Secondary | ICD-10-CM

## 2011-06-15 DIAGNOSIS — D6959 Other secondary thrombocytopenia: Secondary | ICD-10-CM

## 2011-07-16 ENCOUNTER — Ambulatory Visit
Admission: RE | Admit: 2011-07-16 | Discharge: 2011-07-16 | Disposition: A | Discharge status: Home / Self Care | Payer: Medicare Other | Source: Ambulatory Visit | Attending: Radiation Oncology | Admitting: Radiation Oncology

## 2011-08-12 ENCOUNTER — Encounter (HOSPITAL_COMMUNITY)
Admission: RE | Admit: 2011-08-12 | Discharge: 2011-08-12 | Disposition: A | Discharge status: Home / Self Care | Payer: Medicare Other | Source: Ambulatory Visit | Attending: Otolaryngology | Admitting: Otolaryngology

## 2011-08-12 LAB — CBC
HCT: 36.5 % (ref 36.0–46.0)
Hemoglobin: 12.1 g/dL (ref 12.0–15.0)
MCH: 26.9 pg (ref 26.0–34.0)
MCHC: 33.2 g/dL (ref 30.0–36.0)
MCV: 81.3 fL (ref 78.0–100.0)
Platelets: 235 10*3/uL (ref 150–400)
RBC: 4.49 MIL/uL (ref 3.87–5.11)
RDW: 14.8 % (ref 11.5–15.5)
WBC: 4.4 10*3/uL (ref 4.0–10.5)

## 2011-08-12 LAB — BASIC METABOLIC PANEL
BUN: 12 mg/dL (ref 6–23)
CO2: 26 mEq/L (ref 19–32)
Calcium: 10.2 mg/dL (ref 8.4–10.5)
Chloride: 105 mEq/L (ref 96–112)
Creatinine, Ser: 0.73 mg/dL (ref 0.50–1.10)
GFR calc Af Amer: >90 mL/min (ref >90)
GFR calc non Af Amer: 87 mL/min — ABNORMAL LOW (ref >90)
Glucose, Bld: 111 mg/dL — ABNORMAL HIGH (ref 70–99)
Potassium: 4.4 mEq/L (ref 3.5–5.1)
Sodium: 140 mEq/L (ref 135–145)

## 2011-08-17 ENCOUNTER — Ambulatory Visit (HOSPITAL_COMMUNITY)
Admission: RE | Admit: 2011-08-17 | Discharge: 2011-08-17 | Disposition: A | Discharge status: Home / Self Care | Payer: Medicare Other | Source: Ambulatory Visit | Attending: Otolaryngology | Admitting: Otolaryngology

## 2011-08-17 DIAGNOSIS — R498 Other voice and resonance disorders: Secondary | ICD-10-CM | POA: Insufficient documentation

## 2011-08-17 DIAGNOSIS — J3801 Paralysis of vocal cords and larynx, unilateral: Secondary | ICD-10-CM | POA: Insufficient documentation

## 2011-08-17 DIAGNOSIS — Z01812 Encounter for preprocedural laboratory examination: Secondary | ICD-10-CM | POA: Insufficient documentation

## 2011-08-17 DIAGNOSIS — Z8501 Personal history of malignant neoplasm of esophagus: Secondary | ICD-10-CM | POA: Insufficient documentation

## 2011-08-17 LAB — GLUCOSE, CAPILLARY
Glucose-Capillary: 102 mg/dL — ABNORMAL HIGH (ref 70–99)
Glucose-Capillary: 118 mg/dL — ABNORMAL HIGH (ref 70–99)

## 2011-08-20 NOTE — Op Note (Signed)
  NAMESTACI, DACK NO.:  [id number]  MEDICAL RECORD NO.:  [id number]  LOCATION:  [facility]SC                         FACILITY:  [facility]  PHYSICIAN:  Antony Contras, MD     DATE OF BIRTH:  April 14, 1946  DATE OF PROCEDURE:  08/17/2011 DATE OF DISCHARGE:                              OPERATIVE REPORT   PREOPERATIVE DIAGNOSIS:  Left vocal cord paralysis and hoarseness.  POSTOPERATIVE DIAGNOSIS:  Left vocal cord paralysis and hoarseness.  PROCEDURE:  Suspended microdirect laryngoscopy with the radius vocal fold injections.  SURGEON:  Antony Contras, MD  ANESTHESIA:  General jet Venturi ventilation.  COMPLICATIONS:  None.  INDICATION:  The patient is a 65 year old white female who underwent an esophagectomy in July for esophageal cancer that included a stomach pull- up or gastric pull-up.  Ever since surgery, she has had a hoarse voice and was found to have paralysis of her left vocal fold.  Options for treatment were discussed as well as options for further evaluation.  We agreed to proceed with a vocal fold injection, which is not necessarily prominence, but will give her a better voice while waiting to see if the recurrent nerve returns function.  Thus, she presented to the operating room for surgical management.  FINDINGS:  The vocal folds appeared normal on laryngoscopy.  A 0.5 mL was injected in the left vocal fold in two positions with two-thirds of the being posterior and one third anterior, and 0.15 mL was injected in the right fold also with two-thirds posterior and one-third anterior.  DESCRIPTION OF PROCEDURE:  The patient was identified in the holding room and informed consent having been obtained including discussion of risks, benefits, alternatives.  The patient was brought to the operative suite and put on the table in supine position.  Anesthesia was induced and the patient was maintained via mask ventilation.  The bed was turned 90 degrees from  anesthesia and the eyes were taped closed.  The patient was given intravenous steroids during the case.  A tooth guard was placed and a Storz laryngoscope was then inserted into a supraglottic position and placed in suspension on Mayo stand using a Lewy arm.  Jet ventilation was then initiated and then maintained to the case.  After suctioning the airway, a 0 degree telescope was used to make a preoperative photograph.  The radius was then injected under the operating microscope, has described above in the findings.  After this was completed, the larynx was again suctioned and the postoperative photograph was made.  The laryngoscope was then taken out of suspension and removed from the patient's mouth while suctioning the airway, and the patient returned to mask ventilation.  She was turned back to anesthesia for wake-up and was moved to the recovery room in stable condition.     Antony Contras, MD     DDB/MEDQ  D:  08/17/2011  T:  08/18/2011  Job:  [id number]  Electronically Signed by Christia Reading MD on 08/20/2011 01:42:35 PM

## 2011-09-01 ENCOUNTER — Other Ambulatory Visit: Payer: Self-pay | Admitting: Otolaryngology

## 2011-09-01 DIAGNOSIS — R1313 Dysphagia, pharyngeal phase: Secondary | ICD-10-CM

## 2011-09-02 ENCOUNTER — Ambulatory Visit
Admission: RE | Admit: 2011-09-02 | Discharge: 2011-09-02 | Disposition: A | Discharge status: Home / Self Care | Payer: Medicare Other | Source: Ambulatory Visit | Attending: Otolaryngology | Admitting: Otolaryngology

## 2011-09-02 DIAGNOSIS — R1313 Dysphagia, pharyngeal phase: Secondary | ICD-10-CM

## 2011-09-03 ENCOUNTER — Telehealth: Payer: Self-pay | Admitting: Oncology

## 2011-09-03 ENCOUNTER — Encounter (HOSPITAL_BASED_OUTPATIENT_CLINIC_OR_DEPARTMENT_OTHER): Payer: Medicare Other | Admitting: Oncology

## 2011-09-03 DIAGNOSIS — C160 Malignant neoplasm of cardia: Secondary | ICD-10-CM

## 2011-09-03 DIAGNOSIS — R109 Unspecified abdominal pain: Secondary | ICD-10-CM

## 2011-09-03 DIAGNOSIS — I10 Essential (primary) hypertension: Secondary | ICD-10-CM

## 2011-09-03 DIAGNOSIS — R1310 Dysphagia, unspecified: Secondary | ICD-10-CM

## 2011-09-03 NOTE — Telephone Encounter (Signed)
gv pt appt schedule for jan. Per 11/1 pof appt w/dr mann. S/w tre @ dr Kenna Gilbert office + because pt has a previous gi history they will need records for review 1st. Sent copy of 11/1 pof w/fax coversheet to mr to send gi notes to dr Loreta Ave ASAP for review. Pt also signed release in case we need to send for records from [facility] in [place] where she was previously seen for gi hx. Pt aware she will be contacted w/appt. LT aware.

## 2011-09-04 ENCOUNTER — Telehealth: Payer: Self-pay | Admitting: Oncology

## 2011-09-04 NOTE — Telephone Encounter (Signed)
Received call from Briana Craig @ dr Kenna Gilbert office today stating that notes have been reviewed + per dr Loreta Ave she will not be able to see pt. Briana Craig informed.

## 2011-09-08 NOTE — Progress Notes (Deleted)
CC:   Nance Pew, MD Lurline Hare, M.D. Rachael Fee, MD Denyse Dago, MD Antony Contras, MD Jyothi Elsie Amis, MD, Clementeen Graham  HISTORY:  Ms. Mondry returns as scheduled.  The jejunostomy tube was removed several months ago.  Since then, she has had nausea and intermittent pain in the epigastric region after eating.  She occasionally vomits.  She is able to tolerate very small amounts of food.  After 4-5 bites food feels like "it is getting stuck."  She has lost about 5 pounds since her last visit in August.  She has intermittent hiccups.  She underwent a barium swallow on 09/02/2011.  There was no fixed esophageal narrowing or stricture.  There was no laryngeal penetration or tracheal aspiration.  On 08/17/2011, she underwent a vocal fold injection procedure by Dr. Jenne Pane for a left vocal cord paralysis and hoarseness.  She has noted improvement in the hoarseness since that procedure.  PHYSICAL EXAMINATION:  Vital Signs:  Temperature 97.1, heart rate 80, respirations 20, blood pressure 132/81, weight 121.1 pounds (126 pounds on 07/16/2011 and 128.6 pounds on 06/15/2011).  HEENT:  Oropharynx is without thrush or ulceration.  Neck:  No palpable cervical, supraclavicular or axillary lymph nodes.  Lungs:  Clear.  No wheezes or rales.  Heart:  Regular cardiac rhythm.  Abdomen:  Soft.  Fullness at the right upper abdomen.  Liver edge not palpable.  She has a midline abdominal scar.  To the left of the scar, there are 2 distinct areas of erythema on the skin.  The erythema is flat.  There is no induration. Each area measures 1.5 to 2 cm.  Extremities: Without edema.  LABORATORIES:  None.  IMPRESSION AND PLAN: 1. 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     A staging CT and PET scan revealed no evidence for distant     metastatic disease.  An endoscopic ultrasound on 01/30/2011     confirmed a uT3, uN1 tumor.  She began radiation and concurrent     weekly  Taxol/carboplatin chemotherapy on 02/16/2011.  She completed     the course of radiation on 03/25/2011.  The last chemotherapy was     given 03/16/2011.  She is status post an esophagogastrectomy on     05/14/2011 by Dr. Ewing Schlein at [facility].  Per Dr. Marlane Hatcher office note     dated 07/02/2011, pathology showed a 0.7 cm adenocarcinoma with     signet ring cell features, high grade.  All lymph nodes negative     and margins negative (T3 N0). 2. History of iron-deficiency anemia secondary to gastrointestinal     blood loss from the esophagus cancer. 3. History of solid dysphagia, improved following neoadjuvant therapy.     Over the past several months, she has developed recurrent solid     dysphagia.  She requests a referral to Dr. Loreta Ave for evaluation. 4. Odynophagia secondary to the esophagus tumor, improved following     neoadjuvant therapy. 5. History of hypertension. 6. Right ovarian cyst. 7. Skin rash following chemotherapy at week number 4 and week number 5     on Taxol/carboplatin.  We felt the skin rash was most likely     related to carboplatin though the rash could have also been due to     Taxol.  Both Taxol and carboplatin are now listed as allergies. 8. History of thrombocytopenia secondary to chemotherapy and     radiation. 9. History of subcutaneous emphysema at the upper chest bilaterally  following esophagogastrectomy. 10.Left vocal cord paralysis and hoarseness status post radius vocal     fold injections by Dr. Jenne Pane 08/17/2011. 11.Nausea and epigastric discomfort after eating.  As noted above, she     is also experiencing dysphagia.  We are making a referral to Dr.     Loreta Ave.  We are also making a referral to Zenovia Jarred, RD, LDN,     dietitian at the cancer center.  DISPOSITION:  Ms. Lisle continues to follow up with Dr. Ewing Schlein.  We are making a referral to Dr. Loreta Ave as above.  Ms. Freeze will return for a followup visit with Dr. Truett Perna on 11/12/2011.  She will  contact the office in the interim with any problems. The patient was seen with Dr. Truett Perna.    ______________________________ Arnaldo Natal, NP Dictating For Ladene Artist, M.D. LCT/MEDQ  D:  09/03/2011  T:  09/05/2011  Job:  [id number]

## 2011-09-09 NOTE — Progress Notes (Unsigned)
CC:   Lenin Peters, MD Stacy Wentworth, M.D. Daniel P. Jacobs, MD Boyce Keltner D'Amico, MD Dwight D Bates, MD Jyothi Nat Mann, MD, FACG  HISTORY:  Ms. Counterman returns as scheduled.  The jejunostomy tube was removed several months ago.  Since then, she has had nausea and intermittent pain in the epigastric region after eating.  She occasionally vomits.  She is able to tolerate very small amounts of food.  After 4-5 bites food feels like "it is getting stuck."  She has lost about 5 pounds since her last visit in August.  She has intermittent hiccups.  She underwent a barium swallow on 09/02/2011.  There was no fixed esophageal narrowing or stricture.  There was no laryngeal penetration or tracheal aspiration.  On 08/17/2011, she underwent a vocal fold injection procedure by Dr. Bates for a left vocal cord paralysis and hoarseness.  She has noted improvement in the hoarseness since that procedure.  PHYSICAL EXAMINATION:  Vital Signs:  Temperature 97.1, heart rate 80, respirations 20, blood pressure 132/81, weight 121.1 pounds (126 pounds on 07/16/2011 and 128.6 pounds on 06/15/2011).  HEENT:  Oropharynx is without thrush or ulceration.  Neck:  No palpable cervical, supraclavicular or axillary lymph nodes.  Lungs:  Clear.  No wheezes or rales.  Heart:  Regular cardiac rhythm.  Abdomen:  Soft.  Fullness at the right upper abdomen.  Liver edge not palpable.  She has a midline abdominal scar.  To the left of the scar, there are 2 distinct areas of erythema on the skin.  The erythema is flat.  There is no induration. Each area measures 1.5 to 2 cm.  Extremities: Without edema.  LABORATORIES:  None.  IMPRESSION AND PLAN: 1. 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     A staging CT and PET scan revealed no evidence for distant     metastatic disease.  An endoscopic ultrasound on 01/30/2011     confirmed a uT3, uN1 tumor.  She began radiation and concurrent     weekly  Taxol/carboplatin chemotherapy on 02/16/2011.  She completed     the course of radiation on 03/25/2011.  The last chemotherapy was     given 03/16/2011.  She is status post an esophagogastrectomy on     05/14/2011 by Dr. D'Amico at [facility].  Per Dr. D'Amico's office note     dated 07/02/2011, pathology showed a 0.7 cm adenocarcinoma with     signet ring cell features, high grade.  All lymph nodes negative     and margins negative (T3 N0). 2. History of iron-deficiency anemia secondary to gastrointestinal     blood loss from the esophagus cancer. 3. History of solid dysphagia, improved following neoadjuvant therapy.     Over the past several months, she has developed recurrent solid     dysphagia.  She requests a referral to Dr. Mann for evaluation. 4. Odynophagia secondary to the esophagus tumor, improved following     neoadjuvant therapy. 5. History of hypertension. 6. Right ovarian cyst. 7. Skin rash following chemotherapy at week number 4 and week number 5     on Taxol/carboplatin.  We felt the skin rash was most likely     related to carboplatin though the rash could have also been due to     Taxol.  Both Taxol and carboplatin are now listed as allergies. 8. History of thrombocytopenia secondary to chemotherapy and     radiation. 9. History of subcutaneous emphysema at the upper chest bilaterally       following esophagogastrectomy. 10.Left vocal cord paralysis and hoarseness status post radius vocal     fold injections by Dr. Bates 08/17/2011. 11.Nausea and epigastric discomfort after eating.  As noted above, she     is also experiencing dysphagia.  We are making a referral to Dr.     Mann.  We are also making a referral to Barbara Neff, RD, LDN,     dietitian at the cancer center.  DISPOSITION:  Ms. Lamere continues to follow up with Dr. D'Amico.  We are making a referral to Dr. Mann as above.  Ms. Scheid will return for a followup visit with Dr. Sherrill on 11/12/2011.  She will  contact the office in the interim with any problems. The patient was seen with Dr. Sherrill.    ______________________________ Onie Hayashi C Louisa Favaro, NP Dictating For Gary B Sherrill, M.D. LCT/MEDQ  D:  09/03/2011  T:  09/05/2011  Job:  [id number] 

## 2011-11-12 ENCOUNTER — Telehealth: Payer: Self-pay | Admitting: Oncology

## 2011-11-12 ENCOUNTER — Ambulatory Visit (HOSPITAL_BASED_OUTPATIENT_CLINIC_OR_DEPARTMENT_OTHER): Payer: Medicare Other | Admitting: Oncology

## 2011-11-12 ENCOUNTER — Telehealth: Payer: Self-pay

## 2011-11-12 VITALS — BP 130/79 | HR 77 | Temp 97.1°F | Ht 62.5 in | Wt 111.9 lb

## 2011-11-12 DIAGNOSIS — R1310 Dysphagia, unspecified: Secondary | ICD-10-CM | POA: Diagnosis not present

## 2011-11-12 DIAGNOSIS — C160 Malignant neoplasm of cardia: Secondary | ICD-10-CM | POA: Diagnosis not present

## 2011-11-12 DIAGNOSIS — D500 Iron deficiency anemia secondary to blood loss (chronic): Secondary | ICD-10-CM

## 2011-11-12 DIAGNOSIS — Z8719 Personal history of other diseases of the digestive system: Secondary | ICD-10-CM

## 2011-11-12 MED ORDER — PANTOPRAZOLE SODIUM 40 MG PO TBEC: 40.0000 mg | DELAYED_RELEASE_TABLET | Freq: Every day | ORAL | Status: DC

## 2011-11-12 NOTE — Telephone Encounter (Signed)
gve the pt her April 2013 appt calendar. Pt is aware she will be contacted with the appt to see dr Christella Hartigan if dr Truett Perna can fax over her records from dr Noe Gens office to dr Christella Hartigan for him to review prior to seeing the pt.

## 2011-11-12 NOTE — Telephone Encounter (Signed)
Briana Craig, She needs to be seen in next few days at our office. I have a spot tomorrow afternoon. If that doesn't work then sometime early next week (extender?) for dysphagia, history of esophageal cancer, resected last year.  Thanks   [name] take care of this.   ----- Message ----- From: Lucile Shutters, MD Sent: 11/12/2011 2:50 PM To: Rob Bunting, MD  [name], She has solid dysphagia. Ct at [facility] concerning for aspiration.  Will you see her to consider the need for an EGD?  Thanks,  Brad  Pt has been given an appointment for tomorrow on 11/13/11 at 2 pm.  Pt has been notified. She is calling to have her previous records sent here from [facility]

## 2011-11-12 NOTE — Progress Notes (Signed)
OFFICE PROGRESS NOTE   INTERVAL HISTORY:   She continues to have solid dysphagia. She clears the throat frequently and feels that food is "sticking" at the upper chest. She denies fever and shortness of breath. She does not have a significant cough.  She saw Dr. Ewing Schlein on 10/20/2011 and a CT of the chest revealed no evidence of recurrent cancer. Clusters of treat in bud opacities throughout the lungs were felt to represent aspiration. She is maintaining her weight by taking 3-4 cans of Ensure per day.   Objective:  Vital signs in last 24 hours:  Blood pressure 130/79, pulse 77, temperature 97.1 F (36.2 C), temperature source Oral, height 5' 2.5" (1.588 m), weight 111 lb 14.4 oz (50.758 kg). weight 121 pounds on 09/03/2011.    HEENT: Neck without mass Lymphatics: No cervical, supraclavicular, or axillary nodes Resp: Lungs clear bilaterally. No respiratory distress. Cardio: Regular rate and rhythm GI: No hepatomegaly. No mass Vascular: No leg edema     Medications: I have reviewed the patient's current medications.  Assessment/Plan: 1. 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  A staging CT and PET scan revealed no evidence for distant metastatic disease.  An endoscopic ultrasound on 01/30/2011 confirmed a uT3, uN1 tumor.  She began radiation and concurrent weekly Taxol/carboplatin chemotherapy on 02/16/2011.  She completed the course of radiation on 03/25/2011.  The last chemotherapy was given 03/16/2011.  She is status post an esophagogastrectomy on 05/14/2011 by Dr. Ewing Schlein at [facility].  Per Dr. Marlane Hatcher office note dated 07/02/2011, pathology showed a 0.7 cm adenocarcinoma with signet ring cell features, high grade.  All lymph nodes negative and margins negative (T3 N0).                 -restaging CT 10/20/2011 at [facility] showed no evidence of recurrent esophagus cancer. 2. History of iron-deficiency anemia secondary to gastrointestinal blood loss from the  esophagus cancer. 3. History of solid dysphagia, improved following neoadjuvant therapy.  Over the past several months, she has developed recurrent solid dysphagia.   4. Odynophagia secondary to the esophagus tumor, improved following neoadjuvant therapy. 5. History of hypertension. 6. Right ovarian cyst. 7. Skin rash following chemotherapy with week number 4 and week number 5 of Taxol/carboplatin.  We felt the skin rash was most likely related to carboplatin though the rash could have also been due to Taxol.  Both Taxol and carboplatin are now listed as allergies. 8. History of thrombocytopenia secondary to chemotherapy and radiation. 9. History of subcutaneous emphysema at the upper chest bilaterally following esophagogastrectomy. 10. Left vocal cord paralysis and hoarseness status post radius vocal fold injections by Dr. Jenne Pane 08/17/2011. 11. CT 10/20/2011 at [facility] with pulmonary parenchymal opacities concerning for aspiration   Disposition:  She remains in clinical remission from esophagus cancer. She has lost 10 pounds compared to when she was here in November of 2012. This is most likely related to the persistent solid dysphagia. No laryngeal penetration or tracheal aspiration were seen on a barium swallow 09/02/2011. There was no esophageal narrowing or stricture.  There is a question of aspiration on the 10/20/2011 CT. She knows to contact us for a fever, shortness of breath, or an increased amount of coughing. We will make a referral to Dr. Christella Hartigan to consider the indication for an upper endoscopy.  She will return for an office visit here in 4 months. She will contact us in the interim as needed.   Lucile Shutters, MD  11/12/2011  2:37 PM

## 2011-11-13 ENCOUNTER — Encounter: Payer: Self-pay | Admitting: Gastroenterology

## 2011-11-13 ENCOUNTER — Ambulatory Visit (INDEPENDENT_AMBULATORY_CARE_PROVIDER_SITE_OTHER): Payer: Medicare Other | Admitting: Gastroenterology

## 2011-11-13 DIAGNOSIS — C159 Malignant neoplasm of esophagus, unspecified: Secondary | ICD-10-CM | POA: Insufficient documentation

## 2011-11-13 DIAGNOSIS — R1310 Dysphagia, unspecified: Secondary | ICD-10-CM | POA: Diagnosis not present

## 2011-11-13 NOTE — Patient Instructions (Addendum)
You will be set up for an upper endoscopy next Tuesday morning at [facility] (7:30) for dysphagia. Keep taking protonix every day.  20-30 min before a meal. A copy of this information will be made available to Dr. Truett Perna.

## 2011-11-13 NOTE — Progress Notes (Signed)
Review of pertinent gastrointestinal problems: 1. 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 A staging CT and PET scan revealed no evidence for distant metastatic disease. An endoscopic ultrasound on 01/30/2011 confirmed a uT3, uN1 tumor. She began radiation and concurrent weekly Taxol/carboplatin chemotherapy on 02/16/2011. She completed the course of radiation on 03/25/2011. The last chemotherapy was given 03/16/2011. She is status post an esophagogastrectomy on 05/14/2011 by Dr. Ewing Schlein at [facility]. Per Dr. Marlane Hatcher office note dated 07/02/2011, pathology showed a 0.7 cm adenocarcinoma with signet ring cell features, high grade. All lymph nodes negative and margins negative (T3 N0). -restaging CT 10/20/2011 at [facility] showed no evidence of recurrent esophagus cancer   HPI: This is a   very pleasant 66 year old woman whom I last saw about 7 or 8 months ago at the time of an endoscopic ultrasound as a staging workup for her esophageal adenocarcinoma. She had surgery July 2012, esophagogastrectomy. Recently she has had difficulty with dysphasia.  October 2012 barium esophagram showed the anastomosis in her chest without fixed stricture.  Food seems to go down slowly.  Sounds like she had left recurrent laryngeal damage during treatment.  Had ENT inject near vocal cord 09/2010.  Feels like water goes down wrong tube sometimes.    Usually has globus sensation.  Sleeps on extra pillows.  Restarted protonix this week on advice of Dr. Truett Perna.  Has pyrosis at night.  She has stopped losing weight in the past month.  Probably lost 20-30 pounds around time of surgery.     Review of systems: Pertinent positive and negative review of systems were noted in the above HPI section. Complete review of systems was performed and was otherwise normal.    Past Medical History  Diagnosis Date  . Esophageal cancer   . Hypertension   . Hyperlipemia   . GERD (gastroesophageal reflux  disease)     Past Surgical History  Procedure Date  . Esophagus surgery     Current Outpatient Prescriptions  Medication Sig Dispense Refill  . CALCIUM-VITAMIN D PO Take by mouth. Takes the chewable once daily      . Multiple Vitamins-Minerals (MULTIVITAMIN PO) Takes chewable by mouth once daily      . Omega-3 Fatty Acids (FISH OIL PO) Take by mouth. Takes powder form once daily 2000      . pantoprazole (PROTONIX) 40 MG tablet Once daily or as needed        Allergies as of 11/13/2011 - Review Complete 11/13/2011  Allergen Reaction Noted  . Codeine  11/13/2011  . Compazine  11/13/2011  . Penicillins  11/13/2011    Family History  Problem Relation Age of Onset  . Colon cancer Neg Hx     History   Social History  . Marital Status: Married    Spouse Name: N/A    Number of Children: N/A  . Years of Education: N/A   Occupational History  . Retired    Social History Main Topics  . Smoking status: Former Games developer  . Smokeless tobacco: Never Used  . Alcohol Use: No  . Drug Use: No  . Sexually Active: Not on file   Other Topics Concern  . Not on file   Social History Narrative   Caffeine daily        Physical Exam: Wt 112 lb (50.803 kg) Constitutional: generally well-appearing Psychiatric: alert and oriented x3 Eyes: extraocular movements intact Mouth: oral pharynx moist, no lesions Neck: supple no lymphadenopathy Cardiovascular: heart regular rate and rhythm Lungs:  clear to auscultation bilaterally Abdomen: soft, nontender, nondistended, no obvious ascites, no peritoneal signs, normal bowel sounds, large midline scar Extremities: no lower extremity edema bilaterally Skin: no lesions on visible extremities    Assessment and plan: 66 y.o. female with  dysphasia, GERD, globus sensation following esophagogastrectomy July 2012 for esophageal adenocarcinoma  I did splint her that following eye surgery such as the one she had it is probably not reasonable to  expect that she will ever have normal eating again. That being said currently she is pretty miserable from dysphasia, GERD. I suspect this is an anastomotic issue, perhaps edema, perhaps ulceration. Local recurrence would be unusual at this early state but that can also occur. We will proceed with EGD at her soonest convenience. Hopefully early next week. She will stay on Protonix and I explained to her that she will need to be on a stronger antiacid medicine such as this for the rest of her life.

## 2011-11-16 ENCOUNTER — Encounter (HOSPITAL_COMMUNITY): Payer: Self-pay

## 2011-11-17 ENCOUNTER — Ambulatory Visit (HOSPITAL_COMMUNITY): Admit: 2011-11-17 | Payer: Self-pay | Admitting: Gastroenterology

## 2011-11-17 ENCOUNTER — Encounter (HOSPITAL_COMMUNITY): Payer: Self-pay

## 2011-11-17 ENCOUNTER — Encounter (HOSPITAL_COMMUNITY): Payer: Self-pay | Admitting: *Deleted

## 2011-11-17 ENCOUNTER — Encounter (HOSPITAL_COMMUNITY)
Admission: RE | Disposition: A | Discharge status: Home / Self Care | Payer: Self-pay | Source: Ambulatory Visit | Attending: Gastroenterology

## 2011-11-17 ENCOUNTER — Ambulatory Visit (HOSPITAL_COMMUNITY)
Admission: RE | Admit: 2011-11-17 | Discharge: 2011-11-17 | Disposition: A | Discharge status: Home / Self Care | Payer: Medicare Other | Source: Ambulatory Visit | Attending: Gastroenterology | Admitting: Gastroenterology

## 2011-11-17 DIAGNOSIS — I10 Essential (primary) hypertension: Secondary | ICD-10-CM | POA: Diagnosis not present

## 2011-11-17 DIAGNOSIS — C159 Malignant neoplasm of esophagus, unspecified: Secondary | ICD-10-CM | POA: Insufficient documentation

## 2011-11-17 DIAGNOSIS — E785 Hyperlipidemia, unspecified: Secondary | ICD-10-CM | POA: Diagnosis not present

## 2011-11-17 DIAGNOSIS — R1310 Dysphagia, unspecified: Secondary | ICD-10-CM | POA: Diagnosis not present

## 2011-11-17 DIAGNOSIS — K219 Gastro-esophageal reflux disease without esophagitis: Secondary | ICD-10-CM | POA: Insufficient documentation

## 2011-11-17 DIAGNOSIS — Z79899 Other long term (current) drug therapy: Secondary | ICD-10-CM | POA: Insufficient documentation

## 2011-11-17 HISTORY — DX: Nausea with vomiting, unspecified: R11.2

## 2011-11-17 HISTORY — DX: Other specified postprocedural states: Z98.890

## 2011-11-17 HISTORY — PX: ESOPHAGOGASTRODUODENOSCOPY: SHX5428

## 2011-11-17 SURGERY — EGD (ESOPHAGOGASTRODUODENOSCOPY)
Anesthesia: Moderate Sedation

## 2011-11-17 SURGERY — EGD (ESOPHAGOGASTRODUODENOSCOPY)
Anesthesia: Moderate Sedation | Laterality: Left

## 2011-11-17 MED ORDER — FAMOTIDINE 20 MG PO TABS: 20.0000 mg | ORAL_TABLET | Freq: Every day | ORAL | Status: DC

## 2011-11-17 MED ORDER — ONDANSETRON HCL 4 MG/2ML IJ SOLN
INTRAMUSCULAR | Status: DC | PRN
  Administered 2011-11-17: 4 mg via INTRAVENOUS

## 2011-11-17 MED ORDER — ONDANSETRON HCL 4 MG/2ML IJ SOLN
INTRAMUSCULAR | Status: AC
  Filled 2011-11-17: qty 2

## 2011-11-17 MED ORDER — BUTAMBEN-TETRACAINE-BENZOCAINE 2-2-14 % EX AERO
INHALATION_SPRAY | CUTANEOUS | Status: DC | PRN
  Administered 2011-11-17: 2 via TOPICAL

## 2011-11-17 MED ORDER — PANTOPRAZOLE SODIUM 40 MG PO TBEC: 40.0000 mg | DELAYED_RELEASE_TABLET | Freq: Two times a day (BID) | ORAL | Status: DC

## 2011-11-17 MED ORDER — FENTANYL NICU IV SYRINGE 50 MCG/ML
INJECTION | INTRAMUSCULAR | Status: DC | PRN
  Administered 2011-11-17 (×3): 25 ug via INTRAVENOUS

## 2011-11-17 MED ORDER — SODIUM CHLORIDE 0.9 % IV SOLN
Freq: Once | INTRAVENOUS | Status: AC
  Administered 2011-11-17: 500 mL via INTRAVENOUS

## 2011-11-17 MED ORDER — DIPHENHYDRAMINE HCL 50 MG/ML IJ SOLN
INTRAMUSCULAR | Status: AC
  Filled 2011-11-17: qty 1

## 2011-11-17 MED ORDER — MIDAZOLAM HCL 10 MG/2ML IJ SOLN
INTRAMUSCULAR | Status: AC
  Filled 2011-11-17: qty 2

## 2011-11-17 MED ORDER — MIDAZOLAM HCL 10 MG/2ML IJ SOLN
INTRAMUSCULAR | Status: DC | PRN
  Administered 2011-11-17 (×2): 2.5 mg via INTRAVENOUS

## 2011-11-17 MED ORDER — FENTANYL CITRATE 0.05 MG/ML IJ SOLN
INTRAMUSCULAR | Status: AC
  Filled 2011-11-17: qty 2

## 2011-11-17 NOTE — Op Note (Signed)
[facility] [address] [place], [place]  [address]  ENDOSCOPY PROCEDURE REPORT  PATIENT:  Briana, Craig  MR#:  [id number] BIRTHDATE:  [birth year], 65 yrs. old  GENDER:  female ENDOSCOPIST:  Rachael Fee, MD Referred by:  Lavada Mesi Truett Perna, M.D. PROCEDURE DATE:  11/17/2011 PROCEDURE:  EGD with balloon dilatation ASA CLASS:  Class II INDICATIONS:  esophageal adenocarcinoma, resected 2012 following neoadjuvant treatment.  now with dysphagia MEDICATIONS:  Fentanyl 75 mcg IV, Versed 5 mg IV, Zofran 4 mg IV TOPICAL ANESTHETIC:  Cetacaine Spray  DESCRIPTION OF PROCEDURE:   After the risks benefits and alternatives of the procedure were thoroughly explained, informed consent was obtained.  The  endoscope was introduced through the mouth and advanced to the second portion of the duodenum, without limitations.  The instrument was slowly withdrawn as the mucosa was fully examined. <<PROCEDUREIMAGES>>  The gastro-esophageal anastomosis is 18-19cm from incisors (proximal esophagus). There is no neoplastic mucosa at the site but there are some visible sutures, staples. The lumen through the anastomosis narrows very slightly (appearing like a mucosal ring). This was dilated with 20mm CRE TTS balloon but it didn't feel that even this largest size balloon dilated the anastomosis (balloon freely moved through the anastomosis) (see image3 and image4). Otherwise the examination was normal (see image2).    Retroflexed views revealed not done.    The scope was then withdrawn from the patient and the procedure completed. COMPLICATIONS:  None  ENDOSCOPIC IMPRESSION: 1) GE anastomosis is very proximal (18-19cm from incisors) and so there is a signficant amount of stomach in he chest. There was visible suture, staples at the rim like anastomosis which had a lumen >6mm and could not be dilated with the 20mm balloon.  I suspect the majority of her symptoms are related to GERD which  is unavoidable after this surgery. 2) Otherwise normal  post esophagectomy/gastric pull up anatomy.   RECOMMENDATIONS: Maximum medical acid reduction.  Will increase omeprazole o twice daily and add bedtime H2 blocker.  ______________________________ Rachael Fee, MD  cc: Dr. Ewing Schlein at [facility]  n. eSIGNED:   Rachael Fee at 11/17/2011 08:16 AM  Briana Craig, [id number]

## 2011-11-17 NOTE — Interval H&P Note (Signed)
History and Physical Interval Note:  11/17/2011 7:18 AM  Briana Craig  has presented today for surgery, with the diagnosis of Esophageal cancer [150.9] Dysphagia [787.20]  The various methods of treatment have been discussed with the patient and family. After consideration of risks, benefits and other options for treatment, the patient has consented to  Procedure(s): ESOPHAGOGASTRODUODENOSCOPY (EGD) as a surgical intervention .  The patients' history has been reviewed, patient examined, no change in status, stable for surgery.  I have reviewed the patients' chart and labs.  Questions were answered to the patient's satisfaction.     Rob Bunting

## 2011-11-17 NOTE — H&P (View-Only) (Signed)
Review of pertinent gastrointestinal problems: 1. 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 A staging CT and PET scan revealed no evidence for distant metastatic disease. An endoscopic ultrasound on 01/30/2011 confirmed a uT3, uN1 tumor. She began radiation and concurrent weekly Taxol/carboplatin chemotherapy on 02/16/2011. She completed the course of radiation on 03/25/2011. The last chemotherapy was given 03/16/2011. She is status post an esophagogastrectomy on 05/14/2011 by Dr. D'Amico at [facility]. Per Dr. D'Amico's office note dated 07/02/2011, pathology showed a 0.7 cm adenocarcinoma with signet ring cell features, high grade. All lymph nodes negative and margins negative (T3 N0). -restaging CT 10/20/2011 at [facility] showed no evidence of recurrent esophagus cancer   HPI: This is a   very pleasant 65-year-old woman whom I last saw about 7 or 8 months ago at the time of an endoscopic ultrasound as a staging workup for her esophageal adenocarcinoma. She had surgery July 2012, esophagogastrectomy. Recently she has had difficulty with dysphasia.  October 2012 barium esophagram showed the anastomosis in her chest without fixed stricture.  Food seems to go down slowly.  Sounds like she had left recurrent laryngeal damage during treatment.  Had ENT inject near vocal cord 09/2010.  Feels like water goes down wrong tube sometimes.    Usually has globus sensation.  Sleeps on extra pillows.  Restarted protonix this week on advice of Dr. Sherrill.  Has pyrosis at night.  She has stopped losing weight in the past month.  Probably lost 20-30 pounds around time of surgery.     Review of systems: Pertinent positive and negative review of systems were noted in the above HPI section. Complete review of systems was performed and was otherwise normal.    Past Medical History  Diagnosis Date  . Esophageal cancer   . Hypertension   . Hyperlipemia   . GERD (gastroesophageal reflux  disease)     Past Surgical History  Procedure Date  . Esophagus surgery     Current Outpatient Prescriptions  Medication Sig Dispense Refill  . CALCIUM-VITAMIN D PO Take by mouth. Takes the chewable once daily      . Multiple Vitamins-Minerals (MULTIVITAMIN PO) Takes chewable by mouth once daily      . Omega-3 Fatty Acids (FISH OIL PO) Take by mouth. Takes powder form once daily 2000      . pantoprazole (PROTONIX) 40 MG tablet Once daily or as needed        Allergies as of 11/13/2011 - Review Complete 11/13/2011  Allergen Reaction Noted  . Codeine  11/13/2011  . Compazine  11/13/2011  . Penicillins  11/13/2011    Family History  Problem Relation Age of Onset  . Colon cancer Neg Hx     History   Social History  . Marital Status: Married    Spouse Name: N/A    Number of Children: N/A  . Years of Education: N/A   Occupational History  . Retired    Social History Main Topics  . Smoking status: Former Smoker  . Smokeless tobacco: Never Used  . Alcohol Use: No  . Drug Use: No  . Sexually Active: Not on file   Other Topics Concern  . Not on file   Social History Narrative   Caffeine daily        Physical Exam: Wt 112 lb (50.803 kg) Constitutional: generally well-appearing Psychiatric: alert and oriented x3 Eyes: extraocular movements intact Mouth: oral pharynx moist, no lesions Neck: supple no lymphadenopathy Cardiovascular: heart regular rate and rhythm Lungs:   clear to auscultation bilaterally Abdomen: soft, nontender, nondistended, no obvious ascites, no peritoneal signs, normal bowel sounds, large midline scar Extremities: no lower extremity edema bilaterally Skin: no lesions on visible extremities    Assessment and plan: 65 y.o. female with  dysphasia, GERD, globus sensation following esophagogastrectomy July 2012 for esophageal adenocarcinoma  I did splint her that following eye surgery such as the one she had it is probably not reasonable to  expect that she will ever have normal eating again. That being said currently she is pretty miserable from dysphasia, GERD. I suspect this is an anastomotic issue, perhaps edema, perhaps ulceration. Local recurrence would be unusual at this early state but that can also occur. We will proceed with EGD at her soonest convenience. Hopefully early next week. She will stay on Protonix and I explained to her that she will need to be on a stronger antiacid medicine such as this for the rest of her life.     

## 2011-11-18 ENCOUNTER — Encounter (HOSPITAL_COMMUNITY): Payer: Self-pay | Admitting: Gastroenterology

## 2011-11-18 ENCOUNTER — Encounter (HOSPITAL_COMMUNITY): Payer: Self-pay

## 2011-11-19 ENCOUNTER — Telehealth: Payer: Self-pay | Admitting: Gastroenterology

## 2011-11-19 MED ORDER — PANTOPRAZOLE SODIUM 40 MG PO TBEC: 40.0000 mg | DELAYED_RELEASE_TABLET | Freq: Two times a day (BID) | ORAL | Status: DC

## 2011-11-19 NOTE — Telephone Encounter (Signed)
Pt would like protonix sent to mail order pharmacy,  This was sent and pt thanked me for calling

## 2011-12-01 ENCOUNTER — Telehealth: Payer: Self-pay | Admitting: Gastroenterology

## 2011-12-01 NOTE — Telephone Encounter (Signed)
Pt is having her pharmacy send a prior auth form

## 2012-01-12 ENCOUNTER — Telehealth: Payer: Self-pay | Admitting: Oncology

## 2012-01-12 NOTE — Telephone Encounter (Signed)
lmonvm adviisng the pt of her r/s appt from 03/15/2012 to 03/22/2012 due to the md is on pal week of 03/15/2012

## 2012-01-13 DIAGNOSIS — C159 Malignant neoplasm of esophagus, unspecified: Secondary | ICD-10-CM | POA: Insufficient documentation

## 2012-01-14 ENCOUNTER — Ambulatory Visit: Admission: RE | Admit: 2012-01-14 | Payer: Medicare Other | Source: Ambulatory Visit | Admitting: Radiation Oncology

## 2012-01-19 DIAGNOSIS — E119 Type 2 diabetes mellitus without complications: Secondary | ICD-10-CM | POA: Diagnosis not present

## 2012-01-19 DIAGNOSIS — D539 Nutritional anemia, unspecified: Secondary | ICD-10-CM | POA: Diagnosis not present

## 2012-01-19 DIAGNOSIS — E782 Mixed hyperlipidemia: Secondary | ICD-10-CM | POA: Diagnosis not present

## 2012-01-19 DIAGNOSIS — D649 Anemia, unspecified: Secondary | ICD-10-CM | POA: Diagnosis not present

## 2012-01-19 DIAGNOSIS — Z79899 Other long term (current) drug therapy: Secondary | ICD-10-CM | POA: Diagnosis not present

## 2012-02-11 ENCOUNTER — Ambulatory Visit
Admission: RE | Admit: 2012-02-11 | Discharge: 2012-02-11 | Disposition: A | Discharge status: Home / Self Care | Payer: Medicare Other | Source: Ambulatory Visit | Attending: Radiation Oncology | Admitting: Radiation Oncology

## 2012-02-11 ENCOUNTER — Encounter: Payer: Self-pay | Admitting: Radiation Oncology

## 2012-02-11 VITALS — BP 143/89 | HR 69 | Temp 97.3°F | Resp 18 | Wt 107.6 lb

## 2012-02-11 DIAGNOSIS — C159 Malignant neoplasm of esophagus, unspecified: Secondary | ICD-10-CM

## 2012-02-11 NOTE — Progress Notes (Signed)
HERE FOR FU OF ESOPHAGEAL CA.  SWALLOWING IS A LOT BETTER PER PT, EATING SOLIDS WITHOUT PROBLEMS.  ALSO SAYS SHE CAN DRINK LIQUIDS WITHOUT "CHOKING" ANYMORE.  NO PAIN BUT DOES HAVE OCCASIONAL NAUSEA.  HAS SOME TROUBLE WITH DAIRY PRODUCTS MAKING HER NAUSEATED SOMETIMES.  TASTE IS OK

## 2012-02-11 NOTE — Progress Notes (Signed)
  Radiation Oncology         [phone] ________________________________  Name: Briana Craig MRN: [id number]  Date: 02/11/2012  DOB: 01-Oct-1946  Follow-Up Visit Note  CC: [place], PA, PA  Ladene Artist, MD  Diagnosis:    Interval Since Last Radiation:  1 year  Narrative:  The patient returns today for routine follow-up.  She is feeling well and swallowing a lot better. Some things seem to happen about month ago and she can mostly what she wants. She sees Dr. Elnita Maxwell next month and has an appointment Dr. Stormy Card on June 6. She scheduled for a PET scan at that time. Her weight has stabilized and she usually runs between 104 and 107 pounds. Her energy and appetite are great.                              ALLERGIES:  is allergic to taxol; codeine; compazine; and penicillins.  Meds: Current Outpatient Prescriptions  Medication Sig Dispense Refill  . co-enzyme Q-10 30 MG capsule Take 30 mg by mouth 3 (three) times daily.      Marland Kitchen ezetimibe (ZETIA) 10 MG tablet Take 10 mg by mouth daily.      Marland Kitchen CALCIUM-VITAMIN D PO Take by mouth. Takes the chewable once daily      . famotidine (PEPCID) 20 MG tablet Take 1 tablet (20 mg total) by mouth at bedtime.  30 tablet  11  . Multiple Vitamins-Minerals (MULTIVITAMIN PO) Takes chewable by mouth once daily      . Omega-3 Fatty Acids (FISH OIL PO) Take by mouth. Takes powder form once daily 2000      . pantoprazole (PROTONIX) 40 MG tablet Take 1 tablet (40 mg total) by mouth 2 (two) times daily before a meal. Once daily or as needed  180 tablet  3    Physical Findings: The patient is in no acute distress. Patient is alert and oriented.   weight is 107 lb 9.6 oz (48.807 kg). Her oral temperature is 97.3 F (36.3 C). Her blood pressure is 143/89 and her pulse is 69. Her respiration is 18. .  No significant changes.  Lab Findings: Lab Results  Component Value Date   WBC 4.4 08/12/2011   HGB 12.1 08/12/2011   HCT 36.5 08/12/2011   MCV 81.3  08/12/2011   PLT 235 08/12/2011      Radiographic Findings: No results found.  Impression: .  Locally advanced esophageal cancer with no evidence of disease  Plan:  She looks great. I think that she is recovered from the acute effects of treatment and is adjusting to her post esophagectomy lifestyle. I have released her from followup with me. She is regular scheduled followup with Dr. Truett Perna and Dr. Stormy Card. She knows she can always call with any questions or concerns.   _____________________________________

## 2012-02-17 DIAGNOSIS — M19049 Primary osteoarthritis, unspecified hand: Secondary | ICD-10-CM | POA: Diagnosis not present

## 2012-02-17 DIAGNOSIS — G5600 Carpal tunnel syndrome, unspecified upper limb: Secondary | ICD-10-CM | POA: Diagnosis not present

## 2012-03-15 ENCOUNTER — Ambulatory Visit: Payer: Medicare Other | Admitting: Nurse Practitioner

## 2012-03-22 ENCOUNTER — Telehealth: Payer: Self-pay | Admitting: Oncology

## 2012-03-22 ENCOUNTER — Ambulatory Visit (HOSPITAL_BASED_OUTPATIENT_CLINIC_OR_DEPARTMENT_OTHER): Payer: Medicare Other | Admitting: Nurse Practitioner

## 2012-03-22 VITALS — BP 130/83 | HR 81 | Temp 97.2°F | Ht 62.5 in | Wt 108.5 lb

## 2012-03-22 DIAGNOSIS — C159 Malignant neoplasm of esophagus, unspecified: Secondary | ICD-10-CM

## 2012-03-22 NOTE — Progress Notes (Signed)
OFFICE PROGRESS NOTE  Interval history:  Briana Craig returns as scheduled. She underwent an EGD by Dr. Christella Hartigan on 11/17/2011. The GE anastomosis was very proximal and there was a significant amount stomach in the chest. There were visible sutures, staples at the right anastomosis which had a lumen greater than 20 mm. Dr. Christella Hartigan suspected the majority of her symptoms were related to GERD. Omeprazole was increased to twice daily and a bedtime H2 blocker was added.  Briana Craig feels well. She is swallowing without difficulty. She continues to note mild hoarseness. She is eating small frequent meals throughout the day. She has a good appetite. She denies pain. No shortness of breath, cough, fever.   Objective: Blood pressure 130/83, pulse 81, temperature 97.2 F (36.2 C), temperature source Oral, height 5' 2.5" (1.588 m), weight 108 lb 8 oz (49.215 kg).  Oropharynx is without thrush or ulceration. No palpable cervical, supraclavicular or axillary lymph nodes. Lungs are clear. No wheezes or rales. Regular cardiac rhythm. Abdomen is soft and nontender. No hepatomegaly. Extremities are without edema. Calves are soft and nontender.  Lab Results: Lab Results  Component Value Date   WBC 4.4 08/12/2011   HGB 12.1 08/12/2011   HCT 36.5 08/12/2011   MCV 81.3 08/12/2011   PLT 235 08/12/2011    Chemistry:    Chemistry      Component Value Date/Time   NA 140 08/12/2011 1016   K 4.4 08/12/2011 1016   CL 105 08/12/2011 1016   CO2 26 08/12/2011 1016   BUN 12 08/12/2011 1016   CREATININE 0.73 08/12/2011 1016      Component Value Date/Time   CALCIUM 10.2 08/12/2011 1016   ALKPHOS 48 03/09/2011 0838   AST 31 03/09/2011 0838   ALT 36* 03/09/2011 0838   BILITOT 0.3 03/09/2011 0838       Studies/Results: No results found.  Medications: I have reviewed the patient's current medications.  Assessment/Plan:  1. 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 A staging CT and PET scan  revealed no evidence for distant metastatic disease. An endoscopic ultrasound on 01/30/2011 confirmed a uT3, uN1 tumor. She began radiation and concurrent weekly Taxol/carboplatin chemotherapy on 02/16/2011. She completed the course of radiation on 03/25/2011. The last chemotherapy was given 03/16/2011. She is status post an esophagogastrectomy on 05/14/2011 by Dr. Ewing Schlein at [facility]. Per Dr. Marlane Hatcher office note dated 07/02/2011, pathology showed a 0.7 cm adenocarcinoma with signet ring cell features, high grade. All lymph nodes negative and margins negative (T3 N0). Restaging CT 10/20/2011 at [facility] showed no evidence of recurrent esophagus cancer. 2. History of iron-deficiency anemia secondary to gastrointestinal blood loss from the esophagus cancer. 3. History of solid dysphagia, improved following neoadjuvant therapy.  4. Odynophagia secondary to the esophagus tumor, improved following neoadjuvant therapy. 5. History of hypertension. 6. Right ovarian cyst. 7. Skin rash following chemotherapy with week number 4 and week number 5 of Taxol/carboplatin. We felt the skin rash was most likely related to carboplatin though the rash could have also been due to Taxol. Both Taxol and carboplatin are now listed as allergies. 8. History of thrombocytopenia secondary to chemotherapy and radiation. 9. History of subcutaneous emphysema at the upper chest bilaterally following esophagogastrectomy. 10. Left vocal cord paralysis and hoarseness status post radius vocal fold injections by Dr. Jenne Pane 08/17/2011. 11. CT 10/20/2011 at [facility] with pulmonary parenchymal opacities concerning for aspiration. 12. EGD on 11/17/2011. The GE anastomosis very proximal; significant amount of stomach in the chest; visible sutures, staples  at the rim like anastomosis. 13. Probable GERD. She continues omeprazole and Pepcid.  Disposition-Ms. Adrian appears stable. She remains in clinical remission from the esophagus cancer. She will return  for a followup visit in 6 months. She will contact the office in the interim with any problems. She has a followup visit with Dr. Ewing Schlein and a CT scan at [facility] in June.  Plan reviewed with Dr. Truett Perna.  Briana Craig ANP/GNP-BC

## 2012-03-22 NOTE — Telephone Encounter (Signed)
appts made and printed for pt aom °

## 2012-03-23 DIAGNOSIS — M19049 Primary osteoarthritis, unspecified hand: Secondary | ICD-10-CM | POA: Diagnosis not present

## 2012-04-07 DIAGNOSIS — C159 Malignant neoplasm of esophagus, unspecified: Secondary | ICD-10-CM | POA: Diagnosis not present

## 2012-04-20 DIAGNOSIS — D649 Anemia, unspecified: Secondary | ICD-10-CM | POA: Diagnosis not present

## 2012-04-20 DIAGNOSIS — R0602 Shortness of breath: Secondary | ICD-10-CM | POA: Diagnosis not present

## 2012-04-20 DIAGNOSIS — E782 Mixed hyperlipidemia: Secondary | ICD-10-CM | POA: Diagnosis not present

## 2012-04-20 DIAGNOSIS — D539 Nutritional anemia, unspecified: Secondary | ICD-10-CM | POA: Diagnosis not present

## 2012-04-20 DIAGNOSIS — E119 Type 2 diabetes mellitus without complications: Secondary | ICD-10-CM | POA: Diagnosis not present

## 2012-04-20 DIAGNOSIS — Z79899 Other long term (current) drug therapy: Secondary | ICD-10-CM | POA: Diagnosis not present

## 2012-05-19 DIAGNOSIS — H2510 Age-related nuclear cataract, unspecified eye: Secondary | ICD-10-CM | POA: Diagnosis not present

## 2012-05-19 DIAGNOSIS — H40019 Open angle with borderline findings, low risk, unspecified eye: Secondary | ICD-10-CM | POA: Diagnosis not present

## 2012-06-13 DIAGNOSIS — H40019 Open angle with borderline findings, low risk, unspecified eye: Secondary | ICD-10-CM | POA: Diagnosis not present

## 2012-06-13 DIAGNOSIS — H2510 Age-related nuclear cataract, unspecified eye: Secondary | ICD-10-CM | POA: Diagnosis not present

## 2012-06-17 DIAGNOSIS — H2510 Age-related nuclear cataract, unspecified eye: Secondary | ICD-10-CM | POA: Diagnosis not present

## 2012-06-17 DIAGNOSIS — H2589 Other age-related cataract: Secondary | ICD-10-CM | POA: Diagnosis not present

## 2012-07-14 DIAGNOSIS — H2510 Age-related nuclear cataract, unspecified eye: Secondary | ICD-10-CM | POA: Diagnosis not present

## 2012-07-21 DIAGNOSIS — Z79899 Other long term (current) drug therapy: Secondary | ICD-10-CM | POA: Diagnosis not present

## 2012-07-21 DIAGNOSIS — R5381 Other malaise: Secondary | ICD-10-CM | POA: Diagnosis not present

## 2012-07-21 DIAGNOSIS — E119 Type 2 diabetes mellitus without complications: Secondary | ICD-10-CM | POA: Diagnosis not present

## 2012-07-21 DIAGNOSIS — D539 Nutritional anemia, unspecified: Secondary | ICD-10-CM | POA: Diagnosis not present

## 2012-07-21 DIAGNOSIS — R5383 Other fatigue: Secondary | ICD-10-CM | POA: Diagnosis not present

## 2012-07-21 DIAGNOSIS — R0602 Shortness of breath: Secondary | ICD-10-CM | POA: Diagnosis not present

## 2012-07-21 DIAGNOSIS — E782 Mixed hyperlipidemia: Secondary | ICD-10-CM | POA: Diagnosis not present

## 2012-08-08 DIAGNOSIS — Z23 Encounter for immunization: Secondary | ICD-10-CM | POA: Diagnosis not present

## 2012-08-12 DIAGNOSIS — H2589 Other age-related cataract: Secondary | ICD-10-CM | POA: Diagnosis not present

## 2012-08-12 DIAGNOSIS — H2510 Age-related nuclear cataract, unspecified eye: Secondary | ICD-10-CM | POA: Diagnosis not present

## 2012-09-20 ENCOUNTER — Ambulatory Visit (HOSPITAL_BASED_OUTPATIENT_CLINIC_OR_DEPARTMENT_OTHER): Payer: Medicare Other | Admitting: Oncology

## 2012-09-20 ENCOUNTER — Telehealth: Payer: Self-pay | Admitting: Oncology

## 2012-09-20 ENCOUNTER — Other Ambulatory Visit: Payer: Self-pay | Admitting: *Deleted

## 2012-09-20 VITALS — BP 135/89 | HR 83 | Temp 97.3°F | Resp 20 | Ht 62.5 in | Wt 108.0 lb

## 2012-09-20 DIAGNOSIS — C159 Malignant neoplasm of esophagus, unspecified: Secondary | ICD-10-CM

## 2012-09-20 DIAGNOSIS — N83209 Unspecified ovarian cyst, unspecified side: Secondary | ICD-10-CM

## 2012-09-20 DIAGNOSIS — R21 Rash and other nonspecific skin eruption: Secondary | ICD-10-CM

## 2012-09-20 DIAGNOSIS — C153 Malignant neoplasm of upper third of esophagus: Secondary | ICD-10-CM

## 2012-09-20 MED ORDER — PANTOPRAZOLE SODIUM 40 MG PO TBEC: 40.0000 mg | DELAYED_RELEASE_TABLET | Freq: Two times a day (BID) | ORAL | Status: DC

## 2012-09-20 MED ORDER — FAMOTIDINE 20 MG PO TABS: 20.0000 mg | ORAL_TABLET | Freq: Every day | ORAL | Status: DC

## 2012-09-20 NOTE — Progress Notes (Signed)
   [facility]    OFFICE PROGRESS NOTE   INTERVAL HISTORY:   She returns as scheduled. She feels well. Occasional dysphagia when eating certain foods fast. No other complaint. She saw Dr. Ewing Schlein in June and a restaging CT of the chest revealed increased scattered tree and bud opacities raising the possibility of recurrent aspiration. A 0.9 cm nodular opacity in the lingula was also felt to likely represent aspiration or infection.  Objective:  Vital signs in last 24 hours:  Blood pressure 135/89, pulse 83, temperature 97.3 F (36.3 C), temperature source Oral, resp. rate 20, height 5' 2.5" (1.588 m), weight 108 lb (48.988 kg).    HEENT: Neck without mass Lymphatics: No cervical, supraclavicular, or axillary nodes Resp: Scattered end inspiratory rhonchi, good air movement bilaterally, no respiratory distress Cardio: Regular rate and rhythm GI: No hepatomegaly, nontender, no mass Vascular: No leg edema      Medications: I have reviewed the patient's current medications.  Assessment/Plan: 1. 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 A staging CT and PET scan revealed no evidence for distant metastatic disease. An endoscopic ultrasound on 01/30/2011 confirmed a uT3, uN1 tumor. She began radiation and concurrent weekly Taxol/carboplatin chemotherapy on 02/16/2011. She completed the course of radiation on 03/25/2011. The last chemotherapy was given 03/16/2011. She is status post an esophagogastrectomy on 05/14/2011 by Dr. Ewing Schlein at [facility]. Per Dr. Marlane Hatcher office note dated 07/02/2011, pathology showed a 0.7 cm adenocarcinoma with signet ring cell features, high grade. All lymph nodes negative and margins negative (T3 N0). Restaging CT 10/20/2011 at [facility] showed no evidence of recurrent esophagus cancer. CT at [facility] on 04/07/2012 revealed evidence of aspiration/inflammation with a 0.9 cm nodular opacity in the lingula 2. History of iron-deficiency  anemia secondary to gastrointestinal blood loss from the esophagus cancer. 3. History of solid dysphagia, improved following neoadjuvant therapy.  4. Odynophagia secondary to the esophagus tumor, improved following neoadjuvant therapy. 5. History of hypertension. 6. Right ovarian cyst. 7. Skin rash following chemotherapy with week number 4 and week number 5 of Taxol/carboplatin. We felt the skin rash was most likely related to carboplatin though the rash could have also been due to Taxol. Both Taxol and carboplatin are now listed as allergies. 8. History of thrombocytopenia secondary to chemotherapy and radiation. 9. History of subcutaneous emphysema at the upper chest bilaterally following esophagogastrectomy. 10. Left vocal cord paralysis and hoarseness status post radius vocal fold injections by Dr. Jenne Pane 08/17/2011. Improved. 11. CT 10/20/2011 at [facility] with pulmonary parenchymal opacities concerning for aspiration. Increased on a [facility] CT 04/07/2012  12. EGD on 11/17/2011. The GE anastomosis very proximal; significant amount of stomach in the chest; visible sutures, staples at the rim like anastomosis. 13. Probable GERD. She continues omeprazole and Pepcid.  Disposition:  She remains in clinical remission from the esophagus cancer. She is scheduled for an office visit and restaging CT at [facility] within the next few weeks. She will be sure a copy of the CT report is forwarded to [place].  Ms. Poma will return for an office visit in 6 months.   Thornton Papas, MD  09/20/2012  12:08 PM

## 2012-09-20 NOTE — Telephone Encounter (Signed)
appt made and printed for pt aom °

## 2012-10-06 DIAGNOSIS — C159 Malignant neoplasm of esophagus, unspecified: Secondary | ICD-10-CM | POA: Diagnosis not present

## 2012-10-06 DIAGNOSIS — R918 Other nonspecific abnormal finding of lung field: Secondary | ICD-10-CM | POA: Diagnosis not present

## 2012-10-06 DIAGNOSIS — Z9889 Other specified postprocedural states: Secondary | ICD-10-CM | POA: Diagnosis not present

## 2012-10-20 DIAGNOSIS — E782 Mixed hyperlipidemia: Secondary | ICD-10-CM | POA: Diagnosis not present

## 2012-10-20 DIAGNOSIS — D539 Nutritional anemia, unspecified: Secondary | ICD-10-CM | POA: Diagnosis not present

## 2012-10-20 DIAGNOSIS — I10 Essential (primary) hypertension: Secondary | ICD-10-CM | POA: Diagnosis not present

## 2012-10-20 DIAGNOSIS — E119 Type 2 diabetes mellitus without complications: Secondary | ICD-10-CM | POA: Diagnosis not present

## 2012-10-20 DIAGNOSIS — Z79899 Other long term (current) drug therapy: Secondary | ICD-10-CM | POA: Diagnosis not present

## 2012-11-03 ENCOUNTER — Other Ambulatory Visit (HOSPITAL_COMMUNITY): Payer: Self-pay | Admitting: Gastroenterology

## 2012-12-24 IMAGING — US US PELV - US TRANSVAGINAL
1 series · 14 of 25 positions shown · non-contrast
Comparison: None.

CLINICAL DATA: Right ovarian mass seen on outside CT scan.



[Series 1: us pelv - us transvaginal · 0.30mm/px · 14 of 49 slices shown]
[im 1/49]
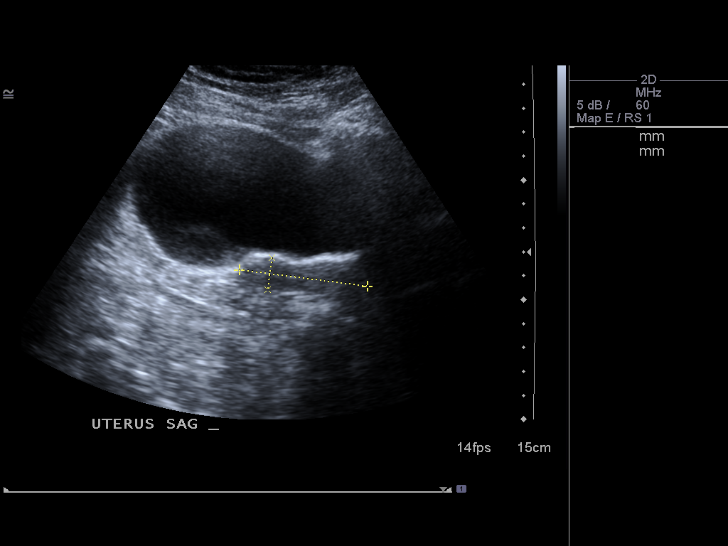
[im 5/49]
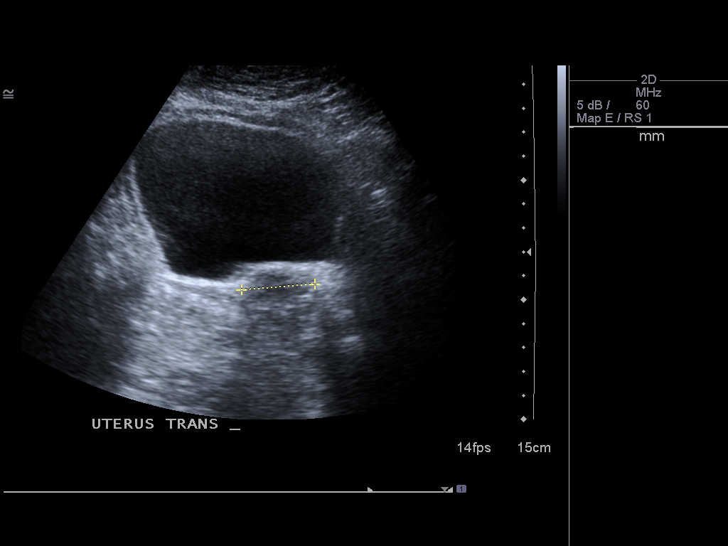
[im 9/49]
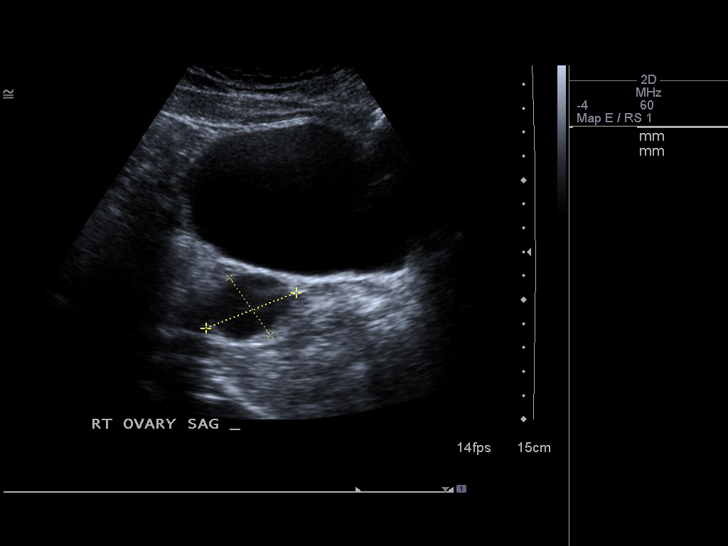
[im 13/49]
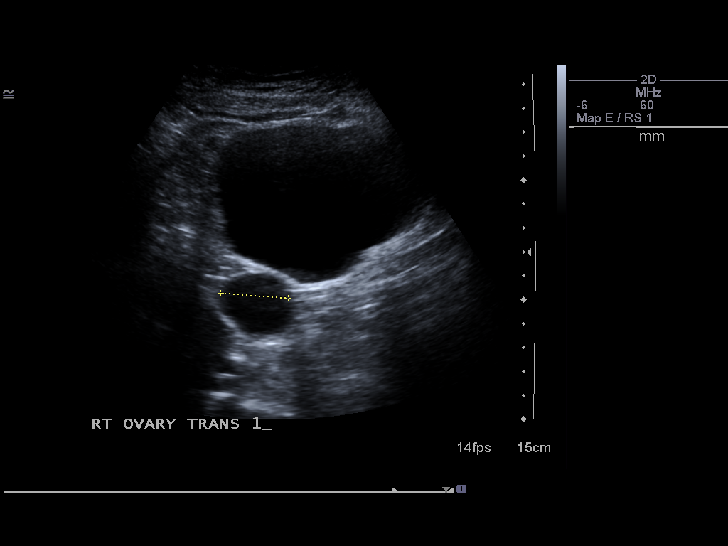
[im 17/49]
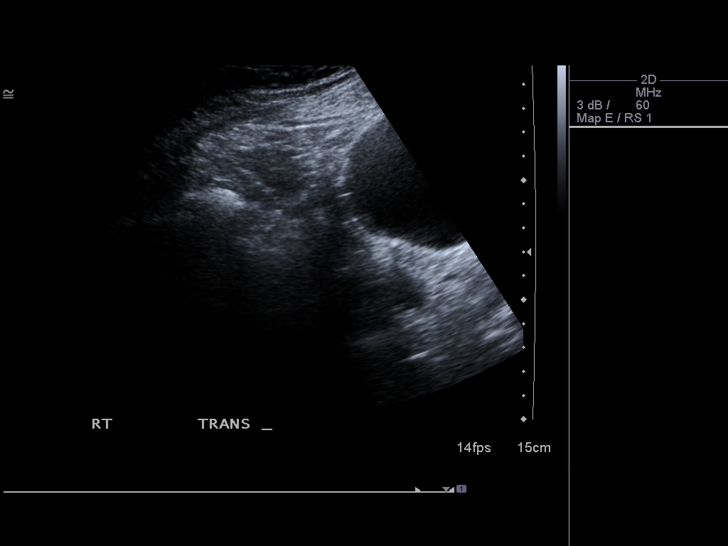
[im 19/49]
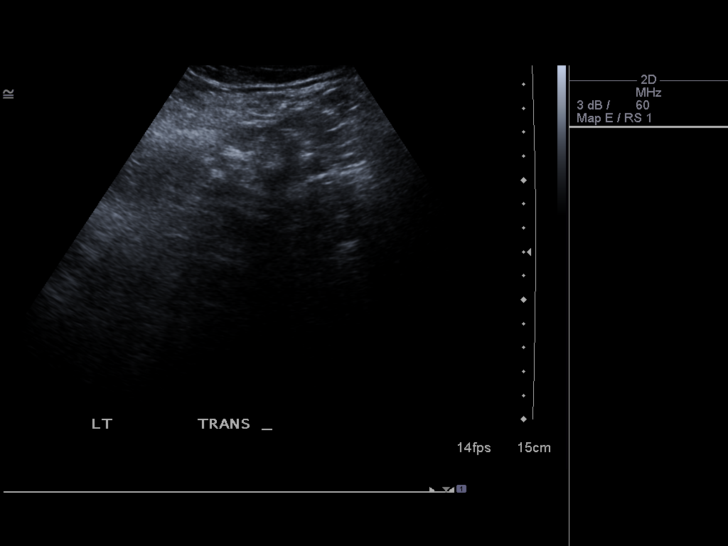
[im 23/49]
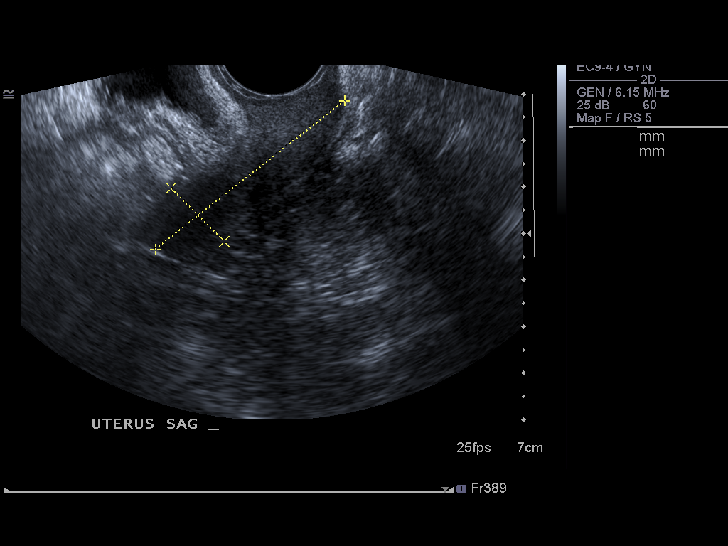
[im 27/49]
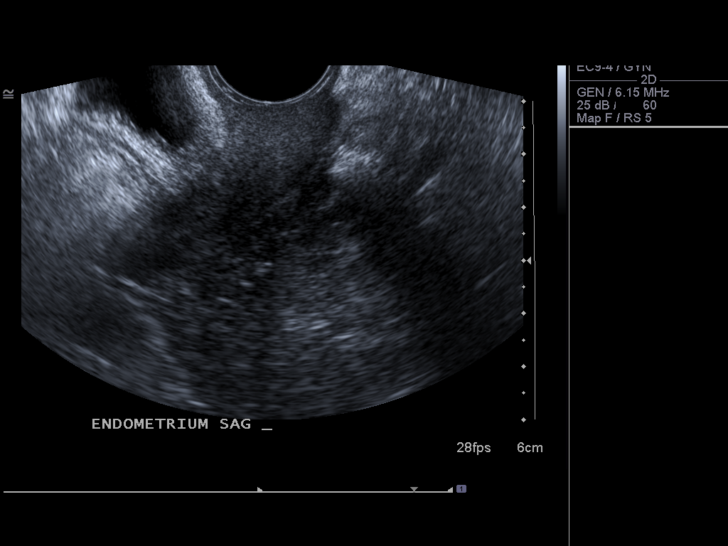
[im 31/49]
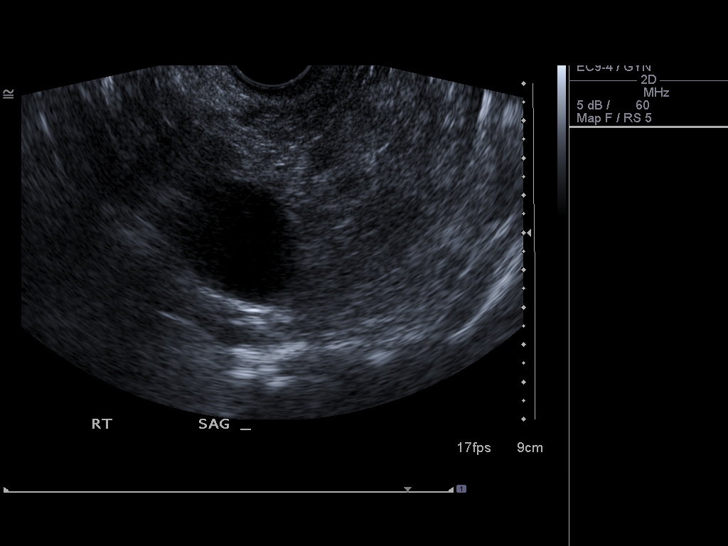
[im 33/49]
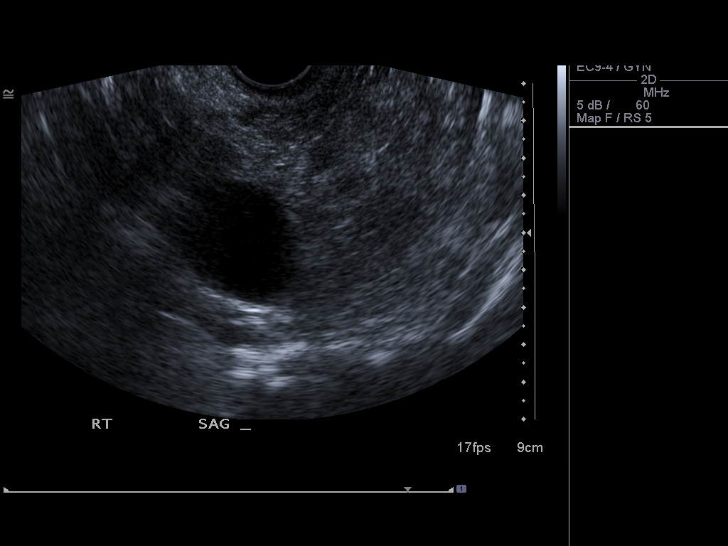
[im 37/49]
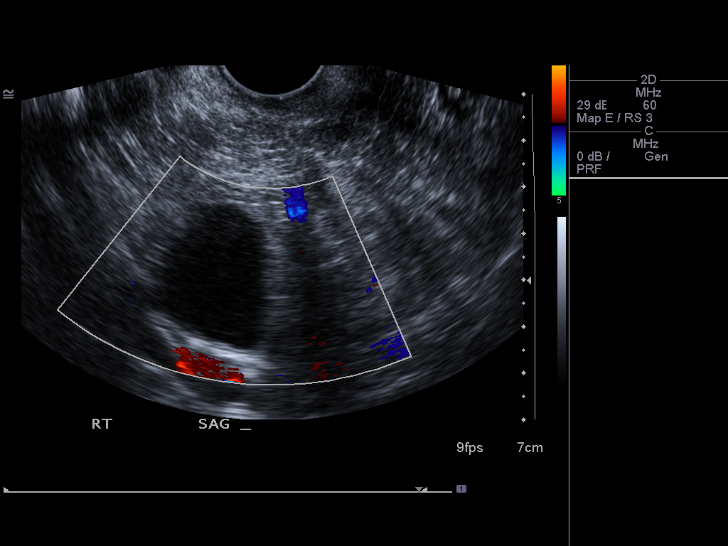
[im 41/49]
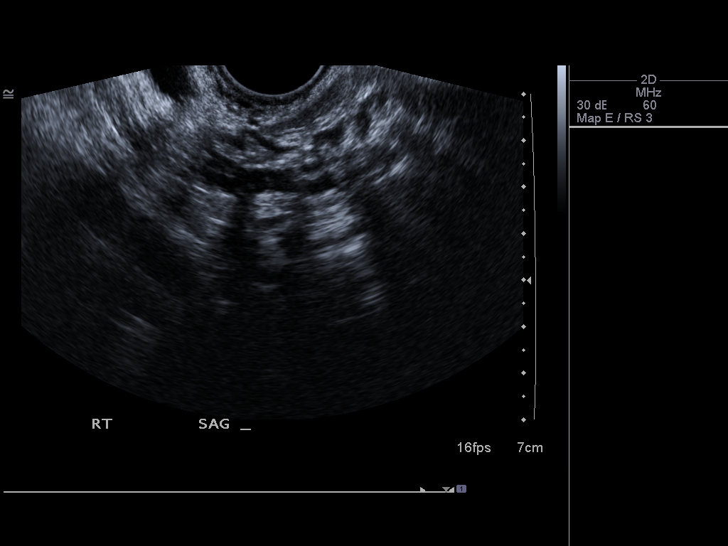
[im 45/49]
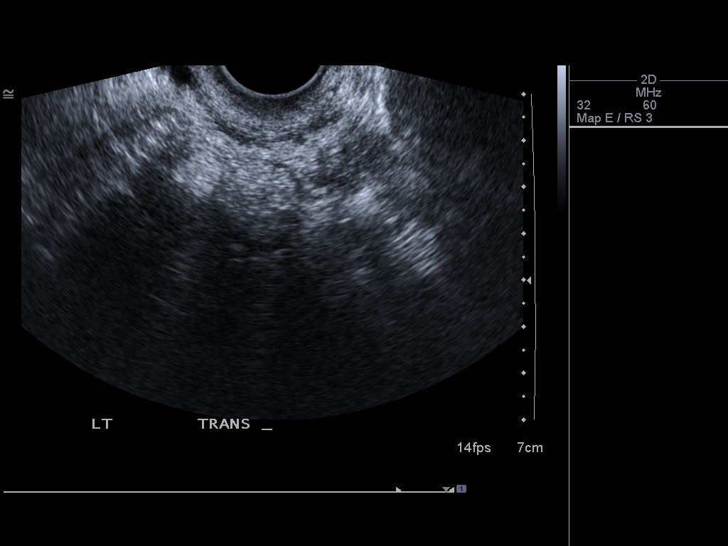
[im 49/49]
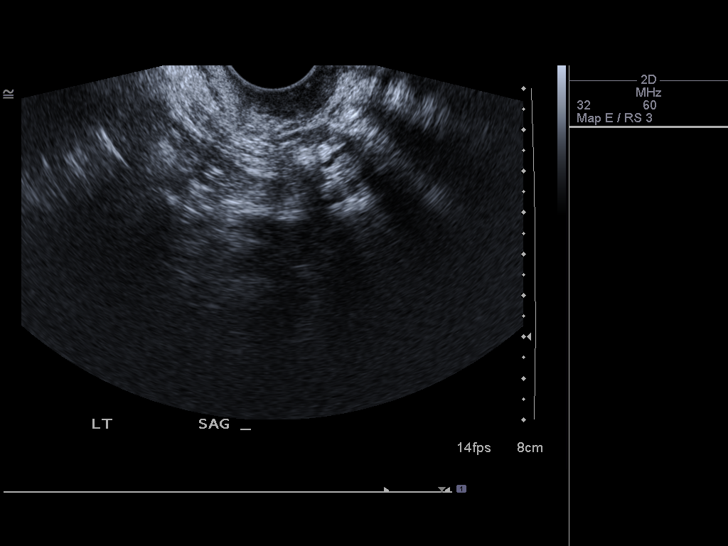

[14 of 25 positions shown; findings below may reference images not displayed]

FINDINGS: Uterus:  Normal.  5.2 x 1.6 x 2.7 cm.  No fibroids or other
uterine masses identified.

Endometrium:  2 mm in thickness.  Normal.

Right ovary:  There is a 3.4 x 2.1 x 2.5 cm hypoechoic lesion in
the right ovary with no internal septations or solid nodules.  This
most likely represents a benign cyst. The overall size of the
right ovary is 4.1 x 3.0 x 3.3 cm.

Left ovary:  Not visualized.
Other findings:  No free fluid.
IMPRESSION: Simple appearing cyst in the right ovary, most likely benign.  I
recommend follow-up transvaginal ultrasound in 2 months.

## 2012-12-26 DIAGNOSIS — H2510 Age-related nuclear cataract, unspecified eye: Secondary | ICD-10-CM | POA: Diagnosis not present

## 2012-12-26 DIAGNOSIS — H40019 Open angle with borderline findings, low risk, unspecified eye: Secondary | ICD-10-CM | POA: Diagnosis not present

## 2012-12-27 DIAGNOSIS — J309 Allergic rhinitis, unspecified: Secondary | ICD-10-CM | POA: Diagnosis not present

## 2012-12-27 DIAGNOSIS — I10 Essential (primary) hypertension: Secondary | ICD-10-CM | POA: Diagnosis not present

## 2012-12-27 DIAGNOSIS — J069 Acute upper respiratory infection, unspecified: Secondary | ICD-10-CM | POA: Diagnosis not present

## 2012-12-27 DIAGNOSIS — E119 Type 2 diabetes mellitus without complications: Secondary | ICD-10-CM | POA: Diagnosis not present

## 2012-12-30 IMAGING — PT NM PET TUM IMG INITIAL (PI) SKULL BASE T - THIGH
1 of 6 series · 1 of 25 positions shown · non-contrast
Comparison: Outside prior studies are not available for
comparison.  Please refer to outside diagnostic CT 01/06/2011 for
further evaluation.

CLINICAL DATA: Initial treatment strategy for esophageal cancer.
Staging.

NUCLEAR MEDICINE PET CT INITIAL (PI) SKULL BASE TO THIGH
TECHNIQUE: 16.1 mCi F-18 FDG was injected intravenously via the
right antecubital IV site.  Full-ring PET imaging was performed
from the skull base through the mid-thighs 100  minutes after
injection.  CT data was obtained and used for attenuation
correction and anatomic localization only.  (This was not acquired
as a diagnostic CT examination.)
Fasting Blood Glucose:  118

[Series 2: ct images · axial · 3.8mm · 0.98mm/px · 1 of 223 slices shown]
[im 223/223  brain]
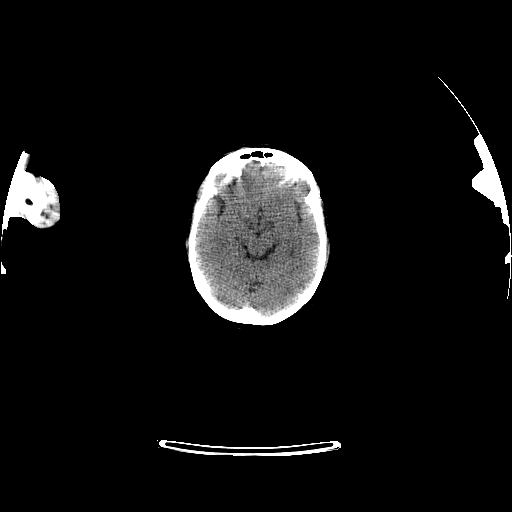

[1 of 25 positions shown; findings below may reference images not displayed]

FINDINGS: There is diffusely abnormal F D G uptake associated with
mass-like distal esophageal wall thickening, S U V maximum 16.9.

Physiologic uptake is noted within the base of brain and nondilated
renal collecting systems and bowel.  No other area of abnormal
uptake is seen in the neck, chest, abdomen, or pelvis. Areas of non
mass-like F D G uptake adjacent to endplate degenerative changes
most likely represent Schmorl's node formation in the thoracolumbar
spine.

Review of CT images obtained for attenuation correction purposes
demonstrates 0.8 cm pretracheal lymph node.  No mediastinal or
hilar lymphadenopathy.  No pericardial or pleural effusion.  5 mm
right lower lobe pleural parenchymal nodular opacity noted on image
89.  Motion artifact degrades imaging of the lungs.  Suggestion of
tree in bud nodular opacities peripherally within the basilar
segment right upper lobe.  Within the abdomen, 6 mm gastrohepatic
lymph node identified image 108.  Other small perigastric nodes
also identified.  No adrenal mass.  Liver is somewhat small in size
with a suggestion of minimally nodular contour.  Gallstone
identified at the gallbladder fundus image 1 of 37, gallbladder
otherwise unremarkable.  Atherosclerotic aortic calcification
without aneurysm.  3.3 cm right adnexal/ovarian cyst incidentally
noted.  Uterus and left ovary unremarkable. Scattered colonic
diverticuli noted without evidence for diverticulitis.
IMPRESSION: Malignant range F D G uptake associated with distal esophageal mass-
like wall thickening, compatible with the provided history of
primary esophageal cancer.  No FDG avid metastatic disease is
identified in the neck, chest, abdomen, or pelvis.

Please refer to outside CT report for further evaluation of
anatomic structures.  The current examination demonstrates a right
lower lobe pulmonary nodule.  If there has not been dedicated chest
CT imaging elsewhere, chest CT with IV contrast is recommended.
Suggestion of tree in bud nodular right upper lobe pulmonary
parenchymal opacities likely indicates small airways infectious
etiology.  Appearance would be atypical for metastasis.

Small gastrohepatic/perigastric lymph nodes.  No focal abnormal F D
G uptake is noted, but these may be below the size threshold for
detection by PET CT.

Small liver with suggestion of minimally nodular contour, does the
patient have a history of cirrhosis?

Right ovarian cyst, for which pelvic ultrasound is recommended for
internal characterization and exclusion of soft tissue component
but could be occult on this exam.

## 2013-01-09 ENCOUNTER — Other Ambulatory Visit: Payer: Self-pay | Admitting: Orthopedic Surgery

## 2013-01-09 DIAGNOSIS — M65839 Other synovitis and tenosynovitis, unspecified forearm: Secondary | ICD-10-CM | POA: Diagnosis not present

## 2013-01-09 DIAGNOSIS — M19049 Primary osteoarthritis, unspecified hand: Secondary | ICD-10-CM | POA: Diagnosis not present

## 2013-01-09 DIAGNOSIS — M65849 Other synovitis and tenosynovitis, unspecified hand: Secondary | ICD-10-CM | POA: Diagnosis not present

## 2013-01-09 DIAGNOSIS — M25532 Pain in left wrist: Secondary | ICD-10-CM

## 2013-01-12 ENCOUNTER — Ambulatory Visit
Admission: RE | Admit: 2013-01-12 | Discharge: 2013-01-12 | Disposition: A | Discharge status: Home / Self Care | Payer: Medicare Other | Source: Ambulatory Visit | Attending: Orthopedic Surgery | Admitting: Orthopedic Surgery

## 2013-01-12 DIAGNOSIS — R609 Edema, unspecified: Secondary | ICD-10-CM | POA: Diagnosis not present

## 2013-01-12 DIAGNOSIS — M65839 Other synovitis and tenosynovitis, unspecified forearm: Secondary | ICD-10-CM | POA: Diagnosis not present

## 2013-01-12 DIAGNOSIS — M65849 Other synovitis and tenosynovitis, unspecified hand: Secondary | ICD-10-CM | POA: Diagnosis not present

## 2013-01-12 DIAGNOSIS — M25532 Pain in left wrist: Secondary | ICD-10-CM

## 2013-01-18 DIAGNOSIS — Z79899 Other long term (current) drug therapy: Secondary | ICD-10-CM | POA: Diagnosis not present

## 2013-01-18 DIAGNOSIS — E782 Mixed hyperlipidemia: Secondary | ICD-10-CM | POA: Diagnosis not present

## 2013-01-18 DIAGNOSIS — D539 Nutritional anemia, unspecified: Secondary | ICD-10-CM | POA: Diagnosis not present

## 2013-01-18 DIAGNOSIS — D649 Anemia, unspecified: Secondary | ICD-10-CM | POA: Diagnosis not present

## 2013-01-18 DIAGNOSIS — E119 Type 2 diabetes mellitus without complications: Secondary | ICD-10-CM | POA: Diagnosis not present

## 2013-01-20 DIAGNOSIS — M19049 Primary osteoarthritis, unspecified hand: Secondary | ICD-10-CM | POA: Diagnosis not present

## 2013-01-20 DIAGNOSIS — M65839 Other synovitis and tenosynovitis, unspecified forearm: Secondary | ICD-10-CM | POA: Diagnosis not present

## 2013-01-20 DIAGNOSIS — M65849 Other synovitis and tenosynovitis, unspecified hand: Secondary | ICD-10-CM | POA: Diagnosis not present

## 2013-01-20 DIAGNOSIS — M6740 Ganglion, unspecified site: Secondary | ICD-10-CM | POA: Diagnosis not present

## 2013-01-23 ENCOUNTER — Encounter (HOSPITAL_BASED_OUTPATIENT_CLINIC_OR_DEPARTMENT_OTHER): Payer: Self-pay | Admitting: *Deleted

## 2013-01-23 NOTE — Progress Notes (Signed)
No labs needed Had esoph ca-surgery-had tx for paralyzed lt vocal coed, now better-less dysphagia

## 2013-01-24 ENCOUNTER — Other Ambulatory Visit: Payer: Self-pay | Admitting: Orthopedic Surgery

## 2013-01-26 ENCOUNTER — Encounter (HOSPITAL_BASED_OUTPATIENT_CLINIC_OR_DEPARTMENT_OTHER): Payer: Self-pay | Admitting: *Deleted

## 2013-01-26 ENCOUNTER — Ambulatory Visit (HOSPITAL_BASED_OUTPATIENT_CLINIC_OR_DEPARTMENT_OTHER)
Admission: RE | Admit: 2013-01-26 | Discharge: 2013-01-26 | Disposition: A | Discharge status: Home / Self Care | Payer: Medicare Other | Source: Ambulatory Visit | Attending: Orthopedic Surgery | Admitting: Orthopedic Surgery

## 2013-01-26 ENCOUNTER — Encounter (HOSPITAL_BASED_OUTPATIENT_CLINIC_OR_DEPARTMENT_OTHER): Payer: Self-pay | Admitting: Certified Registered Nurse Anesthetist

## 2013-01-26 ENCOUNTER — Ambulatory Visit (HOSPITAL_BASED_OUTPATIENT_CLINIC_OR_DEPARTMENT_OTHER): Payer: Medicare Other | Admitting: Certified Registered Nurse Anesthetist

## 2013-01-26 ENCOUNTER — Encounter (HOSPITAL_BASED_OUTPATIENT_CLINIC_OR_DEPARTMENT_OTHER)
Admission: RE | Disposition: A | Discharge status: Home / Self Care | Payer: Self-pay | Source: Ambulatory Visit | Attending: Orthopedic Surgery

## 2013-01-26 DIAGNOSIS — M6530 Trigger finger, unspecified finger: Secondary | ICD-10-CM | POA: Diagnosis not present

## 2013-01-26 DIAGNOSIS — E785 Hyperlipidemia, unspecified: Secondary | ICD-10-CM | POA: Insufficient documentation

## 2013-01-26 DIAGNOSIS — I10 Essential (primary) hypertension: Secondary | ICD-10-CM | POA: Diagnosis not present

## 2013-01-26 DIAGNOSIS — M25539 Pain in unspecified wrist: Secondary | ICD-10-CM | POA: Diagnosis not present

## 2013-01-26 DIAGNOSIS — K219 Gastro-esophageal reflux disease without esophagitis: Secondary | ICD-10-CM | POA: Insufficient documentation

## 2013-01-26 DIAGNOSIS — M65839 Other synovitis and tenosynovitis, unspecified forearm: Secondary | ICD-10-CM | POA: Insufficient documentation

## 2013-01-26 DIAGNOSIS — G8918 Other acute postprocedural pain: Secondary | ICD-10-CM | POA: Diagnosis not present

## 2013-01-26 DIAGNOSIS — M6740 Ganglion, unspecified site: Secondary | ICD-10-CM | POA: Insufficient documentation

## 2013-01-26 DIAGNOSIS — M65849 Other synovitis and tenosynovitis, unspecified hand: Secondary | ICD-10-CM | POA: Diagnosis not present

## 2013-01-26 HISTORY — DX: Dysphagia, unspecified: R13.10

## 2013-01-26 HISTORY — PX: GANGLION CYST EXCISION: SHX1691

## 2013-01-26 LAB — POCT HEMOGLOBIN-HEMACUE: Hemoglobin: 12.7 g/dL (ref 12.0–15.0)

## 2013-01-26 SURGERY — EXCISION, GANGLION CYST, WRIST
Anesthesia: General | Site: Wrist | Laterality: Left | Wound class: Clean

## 2013-01-26 MED ORDER — ONDANSETRON HCL 4 MG/2ML IJ SOLN
INTRAMUSCULAR | Status: DC | PRN
  Administered 2013-01-26: 4 mg via INTRAVENOUS

## 2013-01-26 MED ORDER — PROPOFOL 10 MG/ML IV BOLUS
INTRAVENOUS | Status: DC | PRN
  Administered 2013-01-26: 200 mg via INTRAVENOUS

## 2013-01-26 MED ORDER — MIDAZOLAM HCL 2 MG/2ML IJ SOLN
1.0000 mg | INTRAMUSCULAR | Status: DC | PRN
  Administered 2013-01-26: 2 mg via INTRAVENOUS

## 2013-01-26 MED ORDER — HYDROCODONE-ACETAMINOPHEN 5-325 MG PO TABS: ORAL_TABLET | ORAL | Status: DC

## 2013-01-26 MED ORDER — VANCOMYCIN HCL IN DEXTROSE 1-5 GM/200ML-% IV SOLN
1000.0000 mg | INTRAVENOUS | Status: AC
  Administered 2013-01-26: 1000 mg via INTRAVENOUS

## 2013-01-26 MED ORDER — FENTANYL CITRATE 0.05 MG/ML IJ SOLN
50.0000 ug | INTRAMUSCULAR | Status: DC | PRN
  Administered 2013-01-26: 50 ug via INTRAVENOUS

## 2013-01-26 MED ORDER — LACTATED RINGERS IV SOLN
INTRAVENOUS | Status: DC
  Administered 2013-01-26: 09:00:00 via INTRAVENOUS

## 2013-01-26 MED ORDER — EPHEDRINE SULFATE 50 MG/ML IJ SOLN
INTRAMUSCULAR | Status: DC | PRN
  Administered 2013-01-26 (×2): 10 mg via INTRAVENOUS

## 2013-01-26 MED ORDER — CHLORHEXIDINE GLUCONATE 4 % EX LIQD: 60.0000 mL | Freq: Once | CUTANEOUS | Status: DC

## 2013-01-26 MED ORDER — BUPIVACAINE-EPINEPHRINE PF 0.5-1:200000 % IJ SOLN
INTRAMUSCULAR | Status: DC | PRN
  Administered 2013-01-26: 24 mL

## 2013-01-26 MED ORDER — LIDOCAINE HCL (CARDIAC) 20 MG/ML IV SOLN
INTRAVENOUS | Status: DC | PRN
  Administered 2013-01-26: 30 mg via INTRAVENOUS

## 2013-01-26 MED ORDER — TRAMADOL HCL 50 MG PO TABS: ORAL_TABLET | ORAL | Status: DC

## 2013-01-26 MED ORDER — DEXAMETHASONE SODIUM PHOSPHATE 10 MG/ML IJ SOLN
INTRAMUSCULAR | Status: DC | PRN
  Administered 2013-01-26: 10 mg

## 2013-01-26 SURGICAL SUPPLY — 47 items
BANDAGE ELASTIC 3 VELCRO ST LF (GAUZE/BANDAGES/DRESSINGS) ×2 IMPLANT
BANDAGE GAUZE ELAST BULKY 4 IN (GAUZE/BANDAGES/DRESSINGS) ×2 IMPLANT
BENZOIN TINCTURE PRP APPL 2/3 (GAUZE/BANDAGES/DRESSINGS) IMPLANT
BLADE MINI RND TIP GREEN BEAV (BLADE) IMPLANT
BLADE SURG 15 STRL LF DISP TIS (BLADE) ×2 IMPLANT
BLADE SURG 15 STRL SS (BLADE) ×2
BNDG ELASTIC 2 VLCR STRL LF (GAUZE/BANDAGES/DRESSINGS) IMPLANT
BNDG ESMARK 4X9 LF (GAUZE/BANDAGES/DRESSINGS) ×2 IMPLANT
CHLORAPREP W/TINT 26ML (MISCELLANEOUS) ×2 IMPLANT
CLOTH BEACON ORANGE TIMEOUT ST (SAFETY) ×2 IMPLANT
CORDS BIPOLAR (ELECTRODE) ×2 IMPLANT
COVER MAYO STAND STRL (DRAPES) ×2 IMPLANT
COVER TABLE BACK 60X90 (DRAPES) ×2 IMPLANT
CUFF TOURNIQUET SINGLE 18IN (TOURNIQUET CUFF) ×2 IMPLANT
DRAPE EXTREMITY T 121X128X90 (DRAPE) ×2 IMPLANT
DRAPE SURG 17X23 STRL (DRAPES) ×2 IMPLANT
DRSG PAD ABDOMINAL 8X10 ST (GAUZE/BANDAGES/DRESSINGS) IMPLANT
GAUZE XEROFORM 1X8 LF (GAUZE/BANDAGES/DRESSINGS) ×2 IMPLANT
GLOVE BIO SURGEON STRL SZ 6.5 (GLOVE) ×2 IMPLANT
GLOVE BIO SURGEON STRL SZ7.5 (GLOVE) ×2 IMPLANT
GLOVE BIOGEL PI IND STRL 7.0 (GLOVE) ×2 IMPLANT
GLOVE BIOGEL PI INDICATOR 7.0 (GLOVE) ×2
GLOVE ECLIPSE 6.5 STRL STRAW (GLOVE) ×2 IMPLANT
GLOVE EXAM NITRILE EXT CUFF MD (GLOVE) ×2 IMPLANT
GOWN PREVENTION PLUS XLARGE (GOWN DISPOSABLE) ×2 IMPLANT
GOWN PREVENTION PLUS XXLARGE (GOWN DISPOSABLE) ×2 IMPLANT
NEEDLE HYPO 25X1 1.5 SAFETY (NEEDLE) IMPLANT
NS IRRIG 1000ML POUR BTL (IV SOLUTION) ×2 IMPLANT
PACK BASIN DAY SURGERY FS (CUSTOM PROCEDURE TRAY) ×2 IMPLANT
PAD CAST 3X4 CTTN HI CHSV (CAST SUPPLIES) ×1 IMPLANT
PADDING CAST ABS 4INX4YD NS (CAST SUPPLIES)
PADDING CAST ABS COTTON 4X4 ST (CAST SUPPLIES) IMPLANT
PADDING CAST COTTON 3X4 STRL (CAST SUPPLIES) ×1
SLEEVE SCD COMPRESS KNEE MED (MISCELLANEOUS) ×2 IMPLANT
SLING ULTRA II LARGE (SOFTGOODS) ×2 IMPLANT
SPLINT PLASTER CAST XFAST 3X15 (CAST SUPPLIES) ×10 IMPLANT
SPLINT PLASTER XTRA FASTSET 3X (CAST SUPPLIES) ×10
SPONGE GAUZE 4X4 12PLY (GAUZE/BANDAGES/DRESSINGS) ×2 IMPLANT
STOCKINETTE 4X48 STRL (DRAPES) ×2 IMPLANT
STRIP CLOSURE SKIN 1/2X4 (GAUZE/BANDAGES/DRESSINGS) IMPLANT
SUT ETHILON 4 0 PS 2 18 (SUTURE) ×2 IMPLANT
SUT MNCRL AB 4-0 PS2 18 (SUTURE) IMPLANT
SUT VIC AB 4-0 P2 18 (SUTURE) ×2 IMPLANT
SYR BULB 3OZ (MISCELLANEOUS) ×2 IMPLANT
SYR CONTROL 10ML LL (SYRINGE) IMPLANT
TOWEL OR 17X24 6PK STRL BLUE (TOWEL DISPOSABLE) ×2 IMPLANT
UNDERPAD 30X30 INCONTINENT (UNDERPADS AND DIAPERS) ×2 IMPLANT

## 2013-01-26 NOTE — Transfer of Care (Signed)
Immediate Anesthesia Transfer of Care Note  Patient: Briana Craig  Procedure(s) Performed: Procedure(s): LEFT FLEXOR CARPI RADIALIS  RELEASE AND SCAPHO TRAPEZIAL TRAPEZOID DEBRIDEMENT  (Left)  Patient Location: PACU  Anesthesia Type:GA combined with regional for post-op pain  Level of Consciousness: awake and patient cooperative  Airway & Oxygen Therapy: Patient Spontanous Breathing and Patient connected to face mask oxygen  Post-op Assessment: Report given to PACU RN and Post -op Vital signs reviewed and stable  Post vital signs: Reviewed and stable  Complications: No apparent anesthesia complications

## 2013-01-26 NOTE — H&P (Signed)
  Briana Craig is an 67 y.o. female.   Chief Complaint: left fcr tendonitis HPI: 67 yo rhd female with pain in the fcr tendon of the left wrist and volar ganglion.  These are bothersome to her.  She has tried splinting with continued pain.  She wishes to have a surgical release of the fcr tendon sheath and excision of the ganglion.  Past Medical History  Diagnosis Date  . Hypertension   . Hyperlipemia   . GERD (gastroesophageal reflux disease)   . PONV (postoperative nausea and vomiting)   . Dysphagia   . Esophageal cancer     Past Surgical History  Procedure Laterality Date  . Esophagus surgery    . Esophagogastroduodenoscopy  11/17/2011    Procedure: ESOPHAGOGASTRODUODENOSCOPY (EGD);  Surgeon: Rob Bunting, MD;  Location: [facility];  Service: Endoscopy;  Laterality: N/A;  . Eye surgery      both cataracts  . Direct laryngoscopy with botox injection      tx paralysed vocal cord    Family History  Problem Relation Age of Onset  . Colon cancer Neg Hx    Social History:  reports that she quit smoking about 25 years ago. She has never used smokeless tobacco. She reports that she does not drink alcohol. Her drug history is not on file.  Allergies:  Allergies  Allergen Reactions  . Taxol (Paclitaxel) Hives and Itching  . Carboplatin Hives and Itching  . Codeine   . Compazine   . Penicillins     Medications Prior to Admission  Medication Sig Dispense Refill  . CALCIUM-VITAMIN D PO Take by mouth. Takes the chewable once daily      . ezetimibe (ZETIA) 10 MG tablet Take 10 mg by mouth daily.      . famotidine (PEPCID) 20 MG tablet Take 1 tablet (20 mg total) by mouth at bedtime.  30 tablet  11  . Multiple Vitamins-Minerals (MULTIVITAMIN PO) Takes chewable by mouth once daily      . Omega-3 Fatty Acids (FISH OIL PO) Take by mouth. Takes powder form once daily 2000      . pantoprazole (PROTONIX) 40 MG tablet Take 1 tablet (40 mg total) by mouth 2 (two) times daily before a  meal. Once daily or as needed  180 tablet  3    No results found for this or any previous visit (from the past 48 hour(s)).  No results found.   A comprehensive review of systems was negative except for: Eyes: positive for contacts/glasses  Height 5' 2.5" (1.588 m), weight 49.896 kg (110 lb).  General appearance: alert, cooperative and appears stated age Head: Normocephalic, without obvious abnormality, atraumatic Neck: supple, symmetrical, trachea midline Resp: clear to auscultation bilaterally Cardio: regular rate and rhythm GI: non tender Extremities: intact sensation and capillary refill all digits.  +epl/fpl/io.  ttp over fcr tendon. Pulses: 2+ and symmetric Skin: Skin color, texture, turgor normal. No rashes or lesions Neurologic: Grossly normal Incision/Wound: na  Assessment/Plan Left wrist fcr tendonitis and volar ganglion.  Non operative and operative treatment options were discussed with the patient and patient wishes to proceed with operative treatment. Risks, benefits, and alternatives of surgery were discussed and the patient agrees with the plan of care.   Roxie Kreeger R 01/26/2013, 8:42 AM

## 2013-01-26 NOTE — Anesthesia Preprocedure Evaluation (Signed)
Anesthesia Evaluation  Patient identified by MRN, date of birth, ID band Patient awake    Reviewed: Allergy & Precautions, H&P , NPO status , Patient's Chart, lab work & pertinent test results  History of Anesthesia Complications (+) PONV  Airway Mallampati: I TM Distance: >3 FB Neck ROM: Full    Dental  (+) Teeth Intact and Dental Advisory Given   Pulmonary  breath sounds clear to auscultation        Cardiovascular hypertension, Pt. on medications Rhythm:Regular Rate:Normal     Neuro/Psych    GI/Hepatic   Endo/Other    Renal/GU      Musculoskeletal   Abdominal   Peds  Hematology   Anesthesia Other Findings Pt feels that Zofran is enough 'extra' medicine for her possible PONV.  Reproductive/Obstetrics                           Anesthesia Physical Anesthesia Plan  ASA: II  Anesthesia Plan: General   Post-op Pain Management:    Induction: Intravenous  Airway Management Planned: LMA  Additional Equipment:   Intra-op Plan:   Post-operative Plan: Extubation in OR  Informed Consent: I have reviewed the patients History and Physical, chart, labs and discussed the procedure including the risks, benefits and alternatives for the proposed anesthesia with the patient or authorized representative who has indicated his/her understanding and acceptance.   Dental advisory given  Plan Discussed with: CRNA, Anesthesiologist and Surgeon  Anesthesia Plan Comments:         Anesthesia Quick Evaluation

## 2013-01-26 NOTE — Progress Notes (Signed)
Assisted Dr. Crews with left, ultrasound guided, interscalene  block. Side rails up, monitors on throughout procedure. See vital signs in flow sheet. Tolerated Procedure well. 

## 2013-01-26 NOTE — Op Note (Signed)
[id number] 

## 2013-01-26 NOTE — Anesthesia Postprocedure Evaluation (Signed)
  Anesthesia Post-op Note  Patient: Briana Craig  Procedure(s) Performed: Procedure(s): LEFT FLEXOR CARPI RADIALIS  RELEASE AND SCAPHO TRAPEZIAL TRAPEZOID DEBRIDEMENT  (Left)  Patient Location: PACU  Anesthesia Type:GA combined with regional for post-op pain  Level of Consciousness: awake, alert  and oriented  Airway and Oxygen Therapy: Patient Spontanous Breathing and Patient connected to face mask oxygen  Post-op Pain: none  Post-op Assessment: Post-op Vital signs reviewed  Post-op Vital Signs: Reviewed  Complications: No apparent anesthesia complications

## 2013-01-26 NOTE — Anesthesia Procedure Notes (Addendum)
Anesthesia Regional Block:  Supraclavicular block  Pre-Anesthetic Checklist: ,, timeout performed, Correct Patient, Correct Site, Correct Laterality, Correct Procedure, Correct Position, site marked, Risks and benefits discussed,  Surgical consent,  Pre-op evaluation,  At surgeon's request and post-op pain management  Laterality: Left and Upper  Prep: chloraprep       Needles:  Injection technique: Single-shot  Needle Type: Echogenic Needle     Needle Length: 5cm 5 cm Needle Gauge: 21    Additional Needles:  Procedures: ultrasound guided (picture in chart) Supraclavicular block Narrative:  Start time: 01/26/2013 9:45 AM End time: 01/26/2013 9:52 AM Injection made incrementally with aspirations every 5 mL.  Performed by: Personally  Anesthesiologist: Sheldon Silvan  Supraclavicular block Procedure Name: LMA Insertion Performed by: Karyna Bessler D Pre-anesthesia Checklist: Patient identified, Emergency Drugs available, Suction available and Patient being monitored Patient Re-evaluated:Patient Re-evaluated prior to inductionOxygen Delivery Method: Circle System Utilized Preoxygenation: Pre-oxygenation with 100% oxygen Intubation Type: IV induction Ventilation: Mask ventilation without difficulty LMA: LMA inserted LMA Size: 4.0 Number of attempts: 1 Airway Equipment and Method: bite block Placement Confirmation: positive ETCO2 Tube secured with: Tape Dental Injury: Teeth and Oropharynx as per pre-operative assessment

## 2013-01-26 NOTE — Brief Op Note (Signed)
01/26/2013  11:00 AM  PATIENT:  Briana Craig  67 y.o. female  PRE-OPERATIVE DIAGNOSIS:  LEFT FLEXOR CARPI RADIALIS TENDONITIS AND SCAPHO TRAPEZIAL TRAPEZOID ARTHRITIS VOLAR GANGLION CYST  POST-OPERATIVE DIAGNOSIS:  LEFT FLEXOR CARPI RADIALIS TENDONITIS AND SCAPHO TRAPEZIAL TRAPEZOID ARTHRITIS VOLAR GANGLION CYST  PROCEDURE:  Procedure(s): LEFT FLEXOR CARPI RADIALIS  RELEASE AND SCAPHO TRAPEZIAL TRAPEZOID DEBRIDEMENT  (Left)  SURGEON:  Surgeon(s) and Role:    * Tami Ribas, MD - Primary  PHYSICIAN ASSISTANT:   ASSISTANTS: none   ANESTHESIA:   regional and general  EBL:  Total I/O In: 1000 [I.V.:1000] Out: -   BLOOD ADMINISTERED:none  DRAINS: none   LOCAL MEDICATIONS USED:  NONE  SPECIMEN:  No Specimen  DISPOSITION OF SPECIMEN:  N/A  COUNTS:  YES  TOURNIQUET:   Total Tourniquet Time Documented: Upper Arm (Left) - 28 minutes Total: Upper Arm (Left) - 28 minutes   DICTATION: .Other Dictation: Dictation Number [id number]  PLAN OF CARE: Discharge to home after PACU  PATIENT DISPOSITION:  PACU - hemodynamically stable.

## 2013-01-27 ENCOUNTER — Encounter (HOSPITAL_BASED_OUTPATIENT_CLINIC_OR_DEPARTMENT_OTHER): Payer: Self-pay | Admitting: Orthopedic Surgery

## 2013-01-27 NOTE — Op Note (Signed)
NAMEGREYDIS, Briana Craig NO.:  [id number]  MEDICAL RECORD NO.:  [id number]  LOCATION:                                 FACILITY:  PHYSICIAN:  Betha Loa, MD             DATE OF BIRTH:  DATE OF PROCEDURE:  01/26/2013 DATE OF DISCHARGE:                              OPERATIVE REPORT   PREOPERATIVE DIAGNOSIS:  Left flexor carpi radialis tendinosis and ganglion cyst.  POSTOPERATIVE DIAGNOSIS:  Left flexor carpi radialis tendinosis and ganglion cyst.  PROCEDURE:  Decompression of left FCR.  SURGEON:  Betha Loa, MD.  ASSISTANT:  None.  ANESTHESIA:  General with regional.  IV FLUIDS:  Per anesthesia flow sheet.  ESTIMATED BLOOD LOSS:  Minimal.  COMPLICATIONS:  None.  SPECIMENS:  None.  TOURNIQUET TIME:  20 minutes.  DISPOSITION:  Stable to PACU.  INDICATIONS:  Briana Craig is a 67 year old, right-hand dominant, female has been having pain over the left FCR.  She has tried splinting, anti- inflammatories without relief.  She would like her release of the FCR tendon sheath.  She also notes a mass in the area that she would like to excise as well.  Risks, benefits, alternatives of the surgery were discussed including risk of blood loss, infection, damage to nerves, vessels, tendons, ligaments, bone; failure of surgery; need for additional surgery, complications with wound healing, continued pain. She voiced understanding of these risks and elected to proceed.  OPERATIVE COURSE:  After being identified preoperatively by myself, the patient and I agreed upon procedure and site of procedure.  Surgical site was marked.  The risks, benefits, and alternatives of surgery were reviewed and she wished to proceed.  Surgical consent had been signed. She was given preoperative antibiotic prophylaxis.  She was given a regional block by anesthesia in preoperative holding.  She was transferred to the operating room, placed on the operating table in supine position with  left upper extremity on arm board.  General anesthesia was induced by anesthesiologist.  Left upper extremity was prepped and draped in normal sterile orthopedic fashion.  Surgical pause was performed between surgeons, anesthesia, and operating staff, and all were in agreement as to the patient, procedure, and site of procedure. Tourniquet at the proximal aspect of extremity was inflated to 250 mmHg after exsanguination of the limb with an Esmarch bandage.  A volar Henry- type incision was used centered over the FCR tendon sheath and just onto the thenar eminence.  This was carried into subcutaneous tissues by spreading technique.  Bipolar electrocautery was used to obtain hemostasis.  The FCR tendon was identified.  There was a ganglion coming from within the sheath.  The sheath was released.  Care was taken to protect the superficial branch of the radial artery.  The sheath was released to the distal edge of the trapezial ridge.  The ganglion seemed to be coming from the STT joint.  There was a hole in the capsule in this area.  The rongeurs were used to debride any sharp osteophytes. The FCR tendon was frayed.  Approximately half of the tendon had ruptured.  This was debrided.  The wound was copiously irrigated with sterile saline.  A 4-0 Vicryl suture was used to repair the hole in the capsule.  This provided soft tissue between the tendon and the carpus. The skin was then closed with 4-0 nylon in a horizontal mattress fashion.  The wound was dressed with sterile Xeroform, 4x4s, and wrapped with a Kerlix bandage.  A volar splint was placed and wrapped with Kerlix and Ace bandage.  The operative drapes were broken down. Tourniquet was deflated at 28 minutes.  Fingertips were pink with brisk capillary refill after deflation tourniquet.  Operative drapes were broken down.  The patient was awoken from anesthesia safely.  She was transferred back to stretcher and taken to PACU in stable  condition.  I will see her back in the office in 1 week for postoperative followup.  I will give her Norco 5/325, 1-2 p.o. q.6 hours p.r.n. pain, dispensed #40.     Betha Loa, MD     KK/MEDQ  D:  01/26/2013  T:  01/27/2013  Job:  [id number]

## 2013-03-23 ENCOUNTER — Telehealth: Payer: Self-pay | Admitting: Oncology

## 2013-03-23 ENCOUNTER — Ambulatory Visit (HOSPITAL_BASED_OUTPATIENT_CLINIC_OR_DEPARTMENT_OTHER): Payer: Medicare Other | Admitting: Nurse Practitioner

## 2013-03-23 VITALS — BP 132/81 | HR 72 | Temp 97.7°F | Resp 17 | Ht 62.0 in | Wt 116.6 lb

## 2013-03-23 DIAGNOSIS — R21 Rash and other nonspecific skin eruption: Secondary | ICD-10-CM | POA: Diagnosis not present

## 2013-03-23 DIAGNOSIS — R911 Solitary pulmonary nodule: Secondary | ICD-10-CM | POA: Diagnosis not present

## 2013-03-23 DIAGNOSIS — C159 Malignant neoplasm of esophagus, unspecified: Secondary | ICD-10-CM

## 2013-03-23 DIAGNOSIS — C155 Malignant neoplasm of lower third of esophagus: Secondary | ICD-10-CM

## 2013-03-23 NOTE — Progress Notes (Signed)
OFFICE PROGRESS NOTE  Interval history:  Briana Craig returns as scheduled. She feels well. She has a good appetite. She is gaining weight. Good energy level. No dysphagia. She has occasional nausea if she eats "too much or too fast". Bowels moving regularly. No pain except related to recent surgery on the left wrist. No shortness of breath or cough.   Objective: Blood pressure 132/81, pulse 72, temperature 97.7 F (36.5 C), temperature source Oral, resp. rate 17, height 5\' 2"  (1.575 m), weight 116 lb 9.6 oz (52.889 kg).  Oropharynx is without thrush or ulceration. No palpable cervical, supraclavicular, axillary or inguinal lymph nodes. Faint inspiratory rales at both lung bases. Regular cardiac rhythm. Abdomen soft. No hepatomegaly. Extremities without edema.  Lab Results: Lab Results  Component Value Date   WBC 4.4 08/12/2011   HGB 12.7 01/26/2013   HCT 36.5 08/12/2011   MCV 81.3 08/12/2011   PLT 235 08/12/2011    Chemistry:    Chemistry      Component Value Date/Time   NA 140 08/12/2011 1016   K 4.4 08/12/2011 1016   CL 105 08/12/2011 1016   CO2 26 08/12/2011 1016   BUN 12 08/12/2011 1016   CREATININE 0.73 08/12/2011 1016      Component Value Date/Time   CALCIUM 10.2 08/12/2011 1016   ALKPHOS 48 03/09/2011 0838   AST 31 03/09/2011 0838   ALT 36* 03/09/2011 0838   BILITOT 0.3 03/09/2011 0838       Studies/Results: No results found.  Medications: I have reviewed the patient's current medications.  Assessment/Plan:  1. 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 A staging CT and PET scan revealed no evidence for distant metastatic disease. An endoscopic ultrasound on 01/30/2011 confirmed a uT3, uN1 tumor. She began radiation and concurrent weekly Taxol/carboplatin chemotherapy on 02/16/2011. She completed the course of radiation on 03/25/2011. The last chemotherapy was given 03/16/2011. She is status post an esophagogastrectomy on 05/14/2011 by Dr. Ewing Schlein  at [facility]. Per Dr. Marlane Hatcher office note dated 07/02/2011, pathology showed a 0.7 cm adenocarcinoma with signet ring cell features, high grade. All lymph nodes negative and margins negative (T3 N0). Restaging CT 10/20/2011 at [facility] showed no evidence of recurrent esophagus cancer. CT at [facility] on 04/07/2012 revealed evidence of aspiration/inflammation with a 0.9 cm nodular opacity in the lingula. Restaging chest CT at [facility] on 10/06/2012 showed no evidence of locally recurrent or metastatic disease. Redemonstrated bilateral scattered pulmonary nodules with areas of clustered nodules. Some had decreased in size whereas others had increased. 2. History of iron-deficiency anemia secondary to gastrointestinal blood loss from the esophagus cancer. 3. History of solid dysphagia, improved following neoadjuvant therapy.  4. Odynophagia secondary to the esophagus tumor, improved following neoadjuvant therapy. 5. History of hypertension. 6. Right ovarian cyst. 7. Skin rash following chemotherapy with week number 4 and week number 5 of Taxol/carboplatin. We felt the skin rash was most likely related to carboplatin though the rash could have also been due to Taxol. Both Taxol and carboplatin are now listed as allergies. 8. History of thrombocytopenia secondary to chemotherapy and radiation. 9. History of subcutaneous emphysema at the upper chest bilaterally following esophagogastrectomy. 10. Left vocal cord paralysis and hoarseness status post radius vocal fold injections by Dr. Jenne Pane 08/17/2011. Improved. 11. CT 10/20/2011 at [facility] with pulmonary parenchymal opacities concerning for aspiration. Increased on a [facility] CT 04/07/2012.  12. EGD on 11/17/2011. The GE anastomosis very proximal; significant amount of stomach in the chest; visible sutures, staples at  the rim like anastomosis. 13. Probable GERD. She continues omeprazole and.  Disposition-Ms. Tafoya appears well. She remains in clinical remission from the esophagus  cancer. She continues followup with Dr. Ewing Schlein at [facility]. She will return for a followup visit here in 6 months. She will contact the office in the interim with any problems.  Plan reviewed with Dr. Truett Perna.  Lonna Cobb ANP/GNP-BC

## 2013-04-20 DIAGNOSIS — Z09 Encounter for follow-up examination after completed treatment for conditions other than malignant neoplasm: Secondary | ICD-10-CM | POA: Diagnosis not present

## 2013-04-20 DIAGNOSIS — C159 Malignant neoplasm of esophagus, unspecified: Secondary | ICD-10-CM | POA: Diagnosis not present

## 2013-04-20 DIAGNOSIS — Z8501 Personal history of malignant neoplasm of esophagus: Secondary | ICD-10-CM | POA: Diagnosis not present

## 2013-04-20 DIAGNOSIS — R918 Other nonspecific abnormal finding of lung field: Secondary | ICD-10-CM | POA: Diagnosis not present

## 2013-04-24 ENCOUNTER — Encounter: Payer: Self-pay | Admitting: Family Medicine

## 2013-04-24 ENCOUNTER — Ambulatory Visit (INDEPENDENT_AMBULATORY_CARE_PROVIDER_SITE_OTHER): Payer: Medicare Other | Admitting: Family Medicine

## 2013-04-24 VITALS — BP 142/83 | HR 81 | Temp 98.3°F | Resp 16 | Ht 62.5 in | Wt 117.0 lb

## 2013-04-24 DIAGNOSIS — R7301 Impaired fasting glucose: Secondary | ICD-10-CM

## 2013-04-24 DIAGNOSIS — Z5181 Encounter for therapeutic drug level monitoring: Secondary | ICD-10-CM | POA: Diagnosis not present

## 2013-04-24 DIAGNOSIS — E785 Hyperlipidemia, unspecified: Secondary | ICD-10-CM

## 2013-04-24 DIAGNOSIS — R5381 Other malaise: Secondary | ICD-10-CM | POA: Diagnosis not present

## 2013-04-24 DIAGNOSIS — K219 Gastro-esophageal reflux disease without esophagitis: Secondary | ICD-10-CM

## 2013-04-24 DIAGNOSIS — E559 Vitamin D deficiency, unspecified: Secondary | ICD-10-CM | POA: Diagnosis not present

## 2013-04-24 DIAGNOSIS — R5383 Other fatigue: Secondary | ICD-10-CM

## 2013-04-24 MED ORDER — FAMOTIDINE 20 MG PO TABS: 20.0000 mg | ORAL_TABLET | Freq: Two times a day (BID) | ORAL | Status: DC

## 2013-04-24 MED ORDER — EZETIMIBE 10 MG PO TABS: 10.0000 mg | ORAL_TABLET | Freq: Every day | ORAL | Status: DC

## 2013-04-24 NOTE — Progress Notes (Deleted)
  Subjective:    Patient ID: Briana Craig, female    DOB: 1946/08/27, 67 y.o.   MRN: [id number]  HPI    Review of Systems     Objective:   Physical Exam        Assessment & Plan:

## 2013-04-24 NOTE — Progress Notes (Signed)
       Patient ID: Briana Craig, female    DOB: [birth year], 67 y.o.   MRN: [id number]  HPI   Briana Craig is here today to get established with our office.  She is in need of a couple of medication refills.    She is currently taking Zetia 10 mg which she feels has worked well for her.  She would like to have her lipids and and A1C checked.  Review of Systems  Constitutional: Positive for fatigue. Negative for unexpected weight change.  HENT: Negative.   Respiratory: Negative for shortness of breath.   Cardiovascular: Negative for chest pain, palpitations and leg swelling.  Gastrointestinal:       GERD symptoms   Endocrine: Negative for polydipsia, polyphagia and polyuria.  Genitourinary: Negative for difficulty urinating.  Neurological: Negative.   Psychiatric/Behavioral: Negative.     Past Medical History  Diagnosis Date  . Hypertension   . Hyperlipemia   . GERD (gastroesophageal reflux disease)   . PONV (postoperative nausea and vomiting)   . Dysphagia   . Esophageal cancer    Past Surgical History  Procedure Laterality Date  . Esophagus surgery    . Esophagogastroduodenoscopy  11/17/2011    Procedure: ESOPHAGOGASTRODUODENOSCOPY (EGD);  Surgeon: Rob Bunting, MD;  Location: [facility];  Service: Endoscopy;  Laterality: N/A;  . Eye surgery      both cataracts  . Direct laryngoscopy with botox injection      tx paralysed vocal cord  . Ganglion cyst excision Left 01/26/2013    Procedure: LEFT FLEXOR CARPI RADIALIS  RELEASE AND SCAPHO TRAPEZIAL TRAPEZOID DEBRIDEMENT ;  Surgeon: Tami Ribas, MD;  Location: [facility];  Service: Orthopedics;  Laterality: Left;    Family History  Problem Relation Age of Onset  . Colon cancer Neg Hx   . Alcohol abuse Sister   . Hyperlipidemia Sister   . Hypertension Sister    History   Social History Narrative   Marital Status:  Married Dorene Sorrow)   Children:  None    Pets: Dog (1)    Living Situation: Lives with husband.    Occupation:  Retired Loss adjuster, chartered)    Education: Master's Degree    Tobacco Use/Exposure:  Formal Smoker    Alcohol Use:  Occasional   Drug Use:  None   Diet:  Regular   Exercise:  Walking 1 mile 5 times per week.    Hobbies:  Reading                 Objective:   Physical Exam  Vitals reviewed. Constitutional: She appears well-nourished. No distress.  HENT:  Head: Normocephalic.  Eyes: No scleral icterus.  Neck: No thyromegaly present.  Cardiovascular: Normal rate, regular rhythm and normal heart sounds.   Pulmonary/Chest: Effort normal and breath sounds normal.  Abdominal: There is no tenderness.  Musculoskeletal: She exhibits no edema and no tenderness.  Neurological: She is alert.  Skin: Skin is warm and dry.  Psychiatric: She has a normal mood and affect. Her behavior is normal. Judgment and thought content normal.          Assessment & Plan:

## 2013-04-25 DIAGNOSIS — Z5181 Encounter for therapeutic drug level monitoring: Secondary | ICD-10-CM | POA: Diagnosis not present

## 2013-04-25 DIAGNOSIS — E559 Vitamin D deficiency, unspecified: Secondary | ICD-10-CM | POA: Diagnosis not present

## 2013-04-25 DIAGNOSIS — R5383 Other fatigue: Secondary | ICD-10-CM | POA: Diagnosis not present

## 2013-04-25 DIAGNOSIS — R5381 Other malaise: Secondary | ICD-10-CM | POA: Diagnosis not present

## 2013-04-25 DIAGNOSIS — E785 Hyperlipidemia, unspecified: Secondary | ICD-10-CM | POA: Diagnosis not present

## 2013-04-25 DIAGNOSIS — K219 Gastro-esophageal reflux disease without esophagitis: Secondary | ICD-10-CM | POA: Diagnosis not present

## 2013-04-25 DIAGNOSIS — R7301 Impaired fasting glucose: Secondary | ICD-10-CM | POA: Diagnosis not present

## 2013-04-25 LAB — LIPID PANEL
Cholesterol: 226 mg/dL — ABNORMAL HIGH (ref 0–200)
HDL: 80 mg/dL (ref >39)
LDL Cholesterol: 122 mg/dL — ABNORMAL HIGH (ref 0–99)
Total CHOL/HDL Ratio: 2.8 Ratio
Triglycerides: 122 mg/dL (ref <150)
VLDL: 24 mg/dL (ref 0–40)

## 2013-04-25 LAB — CBC WITH DIFFERENTIAL/PLATELET
Basophils Absolute: 0 10*3/uL (ref 0.0–0.1)
Basophils Relative: 1 % (ref 0–1)
Eosinophils Absolute: 0.2 10*3/uL (ref 0.0–0.7)
Eosinophils Relative: 4 % (ref 0–5)
HCT: 39.4 % (ref 36.0–46.0)
Hemoglobin: 13.3 g/dL (ref 12.0–15.0)
Lymphocytes Relative: 28 % (ref 12–46)
Lymphs Abs: 1.3 10*3/uL (ref 0.7–4.0)
MCH: 28.6 pg (ref 26.0–34.0)
MCHC: 33.8 g/dL (ref 30.0–36.0)
MCV: 84.7 fL (ref 78.0–100.0)
Monocytes Absolute: 0.5 10*3/uL (ref 0.1–1.0)
Monocytes Relative: 11 % (ref 3–12)
Neutro Abs: 2.7 10*3/uL (ref 1.7–7.7)
Neutrophils Relative %: 56 % (ref 43–77)
Platelets: 244 10*3/uL (ref 150–400)
RBC: 4.65 MIL/uL (ref 3.87–5.11)
RDW: 15.8 % — ABNORMAL HIGH (ref 11.5–15.5)
WBC: 4.8 10*3/uL (ref 4.0–10.5)

## 2013-04-25 LAB — HEMOGLOBIN A1C
Hgb A1c MFr Bld: 6.3 % — ABNORMAL HIGH (ref <5.7)
Mean Plasma Glucose: 134 mg/dL — ABNORMAL HIGH (ref <117)

## 2013-04-26 LAB — COMPLETE METABOLIC PANEL WITH GFR
ALT: 14 U/L (ref 0–35)
AST: 19 U/L (ref 0–37)
Albumin: 4.4 g/dL (ref 3.5–5.2)
Alkaline Phosphatase: 60 U/L (ref 39–117)
BUN: 27 mg/dL — ABNORMAL HIGH (ref 6–23)
CO2: 27 mEq/L (ref 19–32)
Calcium: 9.7 mg/dL (ref 8.4–10.5)
Chloride: 106 mEq/L (ref 96–112)
Creat: 1.05 mg/dL (ref 0.50–1.10)
GFR, Est African American: 63 mL/min
GFR, Est Non African American: 55 mL/min — ABNORMAL LOW
Glucose, Bld: 114 mg/dL — ABNORMAL HIGH (ref 70–99)
Potassium: 4.6 mEq/L (ref 3.5–5.3)
Sodium: 140 mEq/L (ref 135–145)
Total Bilirubin: 0.6 mg/dL (ref 0.3–1.2)
Total Protein: 7.2 g/dL (ref 6.0–8.3)

## 2013-04-26 LAB — TSH: TSH: 2.147 u[IU]/mL (ref 0.350–4.500)

## 2013-04-26 LAB — H. PYLORI ANTIBODY, IGG: H Pylori IgG: 0.51 {ISR}

## 2013-04-26 LAB — VITAMIN D 25 HYDROXY (VIT D DEFICIENCY, FRACTURES): Vit D, 25-Hydroxy: 51 ng/mL (ref 30–89)

## 2013-05-01 ENCOUNTER — Encounter: Payer: Self-pay | Admitting: Family Medicine

## 2013-05-01 ENCOUNTER — Ambulatory Visit (INDEPENDENT_AMBULATORY_CARE_PROVIDER_SITE_OTHER): Payer: Medicare Other | Admitting: Family Medicine

## 2013-05-01 VITALS — BP 127/77 | HR 67 | Wt 117.0 lb

## 2013-05-01 DIAGNOSIS — Z23 Encounter for immunization: Secondary | ICD-10-CM | POA: Diagnosis not present

## 2013-05-01 DIAGNOSIS — R5383 Other fatigue: Secondary | ICD-10-CM

## 2013-05-01 DIAGNOSIS — E785 Hyperlipidemia, unspecified: Secondary | ICD-10-CM | POA: Diagnosis not present

## 2013-05-01 DIAGNOSIS — R7301 Impaired fasting glucose: Secondary | ICD-10-CM | POA: Diagnosis not present

## 2013-05-01 DIAGNOSIS — R5381 Other malaise: Secondary | ICD-10-CM

## 2013-05-01 MED ORDER — TETANUS-DIPHTH-ACELL PERTUSSIS 5-2.5-18.5 LF-MCG/0.5 IM SUSP: 0.5000 mL | Freq: Once | INTRAMUSCULAR | Status: DC

## 2013-05-01 NOTE — Patient Instructions (Addendum)
1)  Blood Sugar - Continue to watch your diet and exercise.  You may add some of the supplements listed below.  Look into the Reli On Glucometer for strips that are more affordable.    Complementary and Alternative Medical Therapies for Diabetes Complementary and alternative medicines are health care practices or products that are not always accepted as part of routine medicine. Complementary medicine is used along with routine medicine (medical therapy). Alternative medicine can sometimes be used instead of routine medicine. Some people use these methods to treat diabetes. While some of these therapies may be effective, others may not be. Some may even be harmful. Patients using these methods need to tell their caregiver. It is important to let your caregivers know what you are doing. Some of these therapies are discussed below. For more information, talk with your caregiver. THERAPIES Acupuncture Acupuncture is done by a professional who inserts needles into certain points on the skin. Some scientists believe that this triggers the release of the body's natural painkillers. It has been shown to relieve long-term (chronic) pain. This may help patients with painful nerve damage caused by diabetes. Biofeedback Biofeedback helps a person become more aware of the body's response to pain. It also helps you learn to deal with the pain. This alternative therapy focuses on relaxation and stress-reduction techniques. Thinking of peaceful mental images (guided imagery) is one technique. Some people believe these images can ease their condition. MEDICATIONS Chromium Several studies report that chromium supplements may improve diabetes control. Chromium helps insulin improve its action. Research is not yet certain. Supplements have not been recommended or approved. Caution is needed if you have kidney (renal) problems. Ginseng There are several types of ginseng plants. American ginseng is used for diabetes  studies. Those studies have shown some glucose-lowering effects. Those effects have been seen with fasting and after-meal blood glucose levels. They have also been seen in A1c levels (average blood glucose levels over a 67-month period). More long-term studies are needed before recommendations for use of ginseng can be made. Magnesium Experts have studied the relationship between magnesium and diabetes for many years. But it is not yet fully understood. Studies suggest that a low amount of magnesium may make blood glucose control worse in type 2 diabetes. Research also shows that a low amount may contribute to certain diabetes complications. One study showed that people who consume more magnesium had less risk of type 2 diabetes. Eating whole grains, nuts, and green leafy vegetables raises the magnesium level. Vanadium Vanadium is a compound found in tiny amounts in plants and animals. Early studies showed that vanadium improved blood glucose levels in animals with type 1 and type 2 diabetes. One study found that when given vanadium, those with diabetes were able to decrease their insulin dosage. Researchers still need to learn how it works in the body to discover any side effects, and to find safe dosages. Cinnamon There have been a couple of studies that seem to indicate cinnamon decreases insulin resistance and increases insulin production. By doing so, it may lower blood glucose. Exact doses are unknown, but it may work best when used in combination with other diabetes medicines. Document Released: 08/16/2007 Document Revised: 01/11/2012 Document Reviewed: 08/29/2009 [facility] Patient Information 2014 [place], [place].

## 2013-05-01 NOTE — Progress Notes (Signed)
  Subjective:    Patient ID: Briana Craig, female    DOB: August 02, 1946, 67 y.o.   MRN: [id number]  HPI  Briana Craig is here today to go over her most recent lab results.  She has done well since her last office visit.  She does not have any medical complaints today.   Review of Systems  Constitutional: Negative.   Gastrointestinal: Negative.  Negative for abdominal pain.  Psychiatric/Behavioral: Negative.     Past Medical History  Diagnosis Date  . Hypertension   . Hyperlipemia   . GERD (gastroesophageal reflux disease)   . PONV (postoperative nausea and vomiting)   . Dysphagia   . Esophageal cancer    Family History  Problem Relation Age of Onset  . Colon cancer Neg Hx   . Alcohol abuse Sister   . Hyperlipidemia Sister   . Hypertension Sister    History   Social History Narrative   Marital Status:  Married Dorene Sorrow)   Children:  None    Pets: Dog (01)    Living Situation: Lives with  Dorene Sorrow   Occupation:  Retired Loss adjuster, chartered)    Education: IT sales professional    Tobacco Use/Exposure:  Formal Smoker    Alcohol Use:  Occasional   Drug Use:  None   Diet:  Regular   Exercise:  Walking 1 mile 5 times per week.    Hobbies:  Reading                  Objective:   Physical Exam        Assessment & Plan:

## 2013-05-30 ENCOUNTER — Encounter: Payer: Self-pay | Admitting: Family Medicine

## 2013-05-30 DIAGNOSIS — R7301 Impaired fasting glucose: Secondary | ICD-10-CM | POA: Insufficient documentation

## 2013-05-30 DIAGNOSIS — R5381 Other malaise: Secondary | ICD-10-CM | POA: Insufficient documentation

## 2013-05-30 DIAGNOSIS — Z23 Encounter for immunization: Secondary | ICD-10-CM | POA: Insufficient documentation

## 2013-05-30 DIAGNOSIS — R5383 Other fatigue: Secondary | ICD-10-CM | POA: Insufficient documentation

## 2013-05-30 DIAGNOSIS — E559 Vitamin D deficiency, unspecified: Secondary | ICD-10-CM | POA: Insufficient documentation

## 2013-05-30 DIAGNOSIS — K219 Gastro-esophageal reflux disease without esophagitis: Secondary | ICD-10-CM | POA: Insufficient documentation

## 2013-05-30 DIAGNOSIS — Z5181 Encounter for therapeutic drug level monitoring: Secondary | ICD-10-CM | POA: Insufficient documentation

## 2013-05-30 DIAGNOSIS — E785 Hyperlipidemia, unspecified: Secondary | ICD-10-CM | POA: Insufficient documentation

## 2013-05-30 NOTE — Assessment & Plan Note (Signed)
Refilled her Zetia and checking a lipid panel.

## 2013-05-30 NOTE — Assessment & Plan Note (Signed)
Checking a CMET and A1c.

## 2013-05-30 NOTE — Assessment & Plan Note (Signed)
Labs were normal

## 2013-05-30 NOTE — Assessment & Plan Note (Signed)
Checking a TSH and CBC.   

## 2013-05-30 NOTE — Assessment & Plan Note (Signed)
She is to work harder on her diet and exercise.

## 2013-05-30 NOTE — Assessment & Plan Note (Signed)
Checking a Vitamin D level.   

## 2013-05-30 NOTE — Assessment & Plan Note (Signed)
Elevated for now.

## 2013-05-30 NOTE — Assessment & Plan Note (Addendum)
She was given a prescription for Pepcid to take as needed in addition to the Protonix.  We're also checking her for H. Pylori.

## 2013-06-27 DIAGNOSIS — Z961 Presence of intraocular lens: Secondary | ICD-10-CM | POA: Diagnosis not present

## 2013-06-27 DIAGNOSIS — H40019 Open angle with borderline findings, low risk, unspecified eye: Secondary | ICD-10-CM | POA: Diagnosis not present

## 2013-07-28 DIAGNOSIS — Z23 Encounter for immunization: Secondary | ICD-10-CM | POA: Diagnosis not present

## 2013-09-21 ENCOUNTER — Ambulatory Visit (HOSPITAL_BASED_OUTPATIENT_CLINIC_OR_DEPARTMENT_OTHER): Payer: Medicare Other | Admitting: Oncology

## 2013-09-21 ENCOUNTER — Telehealth: Payer: Self-pay | Admitting: Oncology

## 2013-09-21 VITALS — BP 133/79 | HR 73 | Temp 98.3°F | Resp 18 | Ht 62.5 in | Wt 120.9 lb

## 2013-09-21 DIAGNOSIS — C159 Malignant neoplasm of esophagus, unspecified: Secondary | ICD-10-CM

## 2013-09-21 DIAGNOSIS — C153 Malignant neoplasm of upper third of esophagus: Secondary | ICD-10-CM | POA: Diagnosis not present

## 2013-09-21 DIAGNOSIS — I10 Essential (primary) hypertension: Secondary | ICD-10-CM

## 2013-09-21 NOTE — Telephone Encounter (Signed)
Gave pt appt for md only on November 2015

## 2013-09-21 NOTE — Progress Notes (Signed)
   [facility]    OFFICE PROGRESS NOTE   INTERVAL HISTORY:   Briana Craig returns as scheduled. She feels well. Good appetite and energy level. No dyspnea or consistent cough. She occasionally coughs after drinking liquids she underwent a restaging CT at [facility] on 04/20/2013. No evidence of recurrent esophagus cancer. Stable scattered mixed centrilobular and tree and bud nodules.  Objective:  Vital signs in last 24 hours:  Blood pressure 133/79, pulse 73, temperature 98.3 F (36.8 C), temperature source Oral, resp. rate 18, height 5' 2.5" (1.588 m), weight 120 lb 14.4 oz (54.84 kg).    HEENT: Neck without mass Lymphatics: No cervical or supraclavicular nodes.? Less than 1 cm mobile high left axillary node. Resp: Lungs clear bilaterally Cardio: Regular rate and rhythm GI: No hepatomegaly, nontender, no mass Vascular: No leg edema Breasts: Bilateral breast without mass      Medications: I have reviewed the patient's current medications.  Assessment/Plan: 1. 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 A staging CT and PET scan revealed no evidence for distant metastatic disease. An endoscopic ultrasound on 01/30/2011 confirmed a uT3, uN1 tumor. She began radiation and concurrent weekly Taxol/carboplatin chemotherapy on 02/16/2011. She completed the course of radiation on 03/25/2011. The last chemotherapy was given 03/16/2011. She is status post an esophagogastrectomy on 05/14/2011 by Dr. Ewing Schlein at [facility]. Per Dr. Marlane Hatcher office note dated 07/02/2011, pathology showed a 0.7 cm adenocarcinoma with signet ring cell features, high grade. All lymph nodes negative and margins negative (T3 N0). Restaging CT 10/20/2011 at [facility] showed no evidence of recurrent esophagus cancer. CT at [facility] on 04/07/2012 revealed evidence of aspiration/inflammation with a 0.9 cm nodular opacity in the lingula. Restaging chest CT at [facility] on 10/06/2012 showed no evidence of locally  recurrent or metastatic disease. Restaging CT at [facility] on 04/20/2013 without evidence of recurrent esophagus cancer. 2. History of iron-deficiency anemia secondary to gastrointestinal blood loss from the esophagus cancer. 3. History of solid dysphagia, improved following neoadjuvant therapy.  4. Odynophagia secondary to the esophagus tumor, improved following neoadjuvant therapy. 5. History of hypertension. 6. Right ovarian cyst. 7. Skin rash following chemotherapy with week number 4 and week number 5 of Taxol/carboplatin. We felt the skin rash was most likely related to carboplatin though the rash could have also been due to Taxol. Both Taxol and carboplatin are now listed as allergies. 8. History of thrombocytopenia secondary to chemotherapy and radiation. 9. History of subcutaneous emphysema at the upper chest bilaterally following esophagogastrectomy. 10. Left vocal cord paralysis and hoarseness status post radius vocal fold injections by Dr. Jenne Pane 08/17/2011. Improved. 11. CT 10/20/2011 at [facility] with pulmonary parenchymal opacities concerning for aspiration. Increased on a [facility] CT 04/07/2012, stable 04/20/2013 12. EGD on 11/17/2011. The GE anastomosis very proximal; significant amount of stomach in the chest; visible sutures, staples at the rim like anastomosis. 13. Probable GERD. She continues omeprazole and Pepcid.  Disposition:  [name] remains in clinical remission from esophagus cancer. She will see Dr. Ewing Schlein in June of 2015. She will return for an office visit here in one year. [name] will contact us in the interim for new symptoms. She will seek medical attention for shortness of breath or a consistent cough.   Thornton Papas, MD  09/21/2013  10:11 AM

## 2013-10-18 ENCOUNTER — Ambulatory Visit (INDEPENDENT_AMBULATORY_CARE_PROVIDER_SITE_OTHER): Payer: Medicare Other | Admitting: Family Medicine

## 2013-10-18 ENCOUNTER — Encounter: Payer: Self-pay | Admitting: Family Medicine

## 2013-10-18 VITALS — BP 131/88 | HR 81 | Resp 16 | Wt 117.0 lb

## 2013-10-18 DIAGNOSIS — R7301 Impaired fasting glucose: Secondary | ICD-10-CM

## 2013-10-18 DIAGNOSIS — M25519 Pain in unspecified shoulder: Secondary | ICD-10-CM

## 2013-10-18 DIAGNOSIS — E119 Type 2 diabetes mellitus without complications: Secondary | ICD-10-CM

## 2013-10-18 DIAGNOSIS — M25511 Pain in right shoulder: Secondary | ICD-10-CM

## 2013-10-18 LAB — POCT GLYCOSYLATED HEMOGLOBIN (HGB A1C): Hemoglobin A1C: 6.5

## 2013-10-18 MED ORDER — DICLOFENAC SODIUM 1 % TD GEL: 4.0000 g | Freq: Four times a day (QID) | TRANSDERMAL | Status: DC

## 2013-10-18 MED ORDER — KETOROLAC TROMETHAMINE 60 MG/2ML IM SOLN
60.0000 mg | Freq: Once | INTRAMUSCULAR | Status: AC
  Administered 2013-10-18: 60 mg via INTRAMUSCULAR

## 2013-10-18 MED ORDER — SODIUM CHLORIDE 0.9 % IV SOLN
125.0000 mg | Freq: Once | INTRAVENOUS | Status: AC
  Administered 2013-10-18: 125 mg via INTRAMUSCULAR

## 2013-10-18 MED ORDER — METFORMIN HCL 500 MG PO TABS: 500.0000 mg | ORAL_TABLET | Freq: Two times a day (BID) | ORAL | Status: DC

## 2013-10-18 NOTE — Progress Notes (Signed)
Subjective:    Patient ID: Briana Craig, female    DOB: 17-Aug-1946, 67 y.o.   MRN: [id number]  Briana Craig is here today to discuss the conditions listed below:   Shoulder Pain  The pain is present in the right shoulder, right arm, right wrist and right hand. This is a new problem. The current episode started more than 1 month ago. There has been no history of extremity trauma. The problem has been gradually worsening. The pain is at a severity of 7/10. The pain is severe. Associated symptoms include numbness. Associated symptoms comments: Middle finger. The symptoms are aggravated by activity. She has tried acetaminophen for the symptoms.    2)  IFG:  She needs her A1C rechecked.     Review of Systems  Constitutional: Negative for activity change, fatigue and unexpected weight change.  HENT: Negative.   Eyes: Negative.   Respiratory: Negative for shortness of breath.   Cardiovascular: Negative for chest pain, palpitations and leg swelling.  Gastrointestinal: Negative for diarrhea and constipation.  Endocrine: Negative.   Genitourinary: Negative for difficulty urinating.  Musculoskeletal: Negative.   Skin: Negative.   Neurological: Positive for numbness.  Hematological: Negative for adenopathy. Does not bruise/bleed easily.  Psychiatric/Behavioral: Negative for sleep disturbance and dysphoric mood. The patient is not nervous/anxious.      Past Medical History  Diagnosis Date  . Hypertension   . Hyperlipemia   . GERD (gastroesophageal reflux disease)   . PONV (postoperative nausea and vomiting)   . Dysphagia   . Esophageal cancer      Past Surgical History  Procedure Laterality Date  . Esophagus surgery    . Esophagogastroduodenoscopy  11/17/2011    Procedure: ESOPHAGOGASTRODUODENOSCOPY (EGD);  Surgeon: Rob Bunting, MD;  Location: [facility];  Service: Endoscopy;  Laterality: N/A;  . Eye surgery      both cataracts  . Direct laryngoscopy with botox injection      tx  paralysed vocal cord  . Ganglion cyst excision Left 01/26/2013    Procedure: LEFT FLEXOR CARPI RADIALIS  RELEASE AND SCAPHO TRAPEZIAL TRAPEZOID DEBRIDEMENT ;  Surgeon: Tami Ribas, MD;  Location: [facility];  Service: Orthopedics;  Laterality: Left;     History   Social History Narrative   Marital Status:  Married Dorene Sorrow)   Children:  None    Pets: Dog (1)    Living Situation: Lives with husband.   Occupation:  Retired Loss adjuster, chartered)    Education: Master's Degree    Tobacco Use/Exposure:  Formal Smoker    Alcohol Use:  Occasional   Drug Use:  None   Diet:  Regular   Exercise:  Walking 1 mile 5 times per week.    Hobbies:  Reading                  Family History  Problem Relation Age of Onset  . Colon cancer Neg Hx   . Alcohol abuse Sister   . Hyperlipidemia Sister   . Hypertension Sister      Current Outpatient Prescriptions on File Prior to Visit  Medication Sig Dispense Refill  . CALCIUM-VITAMIN D PO Take 600 mg by mouth daily.       . cetirizine (ZYRTEC) 10 MG tablet Take 10 mg by mouth as needed for allergies.      Marland Kitchen ezetimibe (ZETIA) 10 MG tablet Take 1 tablet (10 mg total) by mouth daily.  90 tablet  3  . famotidine (PEPCID) 20 MG tablet Take  1 tablet (20 mg total) by mouth 2 (two) times daily.  180 tablet  3  . fluticasone (FLONASE) 50 MCG/ACT nasal spray Place 2 sprays into the nose daily as needed.       . Multiple Vitamins-Minerals (MULTIVITAMIN PO) Take 1 tablet by mouth daily.       . Omega-3 Fatty Acids (FISH OIL PO) Take 1,200 mg by mouth daily.        No current facility-administered medications on file prior to visit.     Allergies  Allergen Reactions  . Taxol [Paclitaxel] Hives and Itching  . Tramadol     "Sweaty, shaky and faint"  . Carboplatin Hives and Itching  . Codeine   . Compazine   . Penicillins      Immunization History  Administered Date(s) Administered  . Tdap 05/01/2013      Objective:   Physical Exam  Vitals  reviewed. Constitutional: She is oriented to person, place, and time.  Eyes: Conjunctivae are normal. Pupils are equal, round, and reactive to light. No scleral icterus.  Neck: Neck supple. No thyromegaly present.  Cardiovascular: Normal rate, regular rhythm and normal heart sounds.   Pulmonary/Chest: Effort normal and breath sounds normal.  Musculoskeletal: She exhibits no edema and no tenderness.  Lymphadenopathy:    She has no cervical adenopathy.  Neurological: She is alert and oriented to person, place, and time.  Skin: Skin is warm and dry.  Psychiatric: She has a normal mood and affect. Her behavior is normal. Judgment and thought content normal.      Assessment & Plan:    Briana Craig was seen today for shoulder pain and blood sugar problem.  Diagnoses and associated orders for this visit:  Type II or unspecified type diabetes mellitus without mention of complication, not stated as uncontrolled - POCT glycosylated hemoglobin (Hb A1C) - metFORMIN (GLUCOPHAGE) 500 MG tablet; Take 1 tablet (500 mg total) by mouth 2 (two) times daily with a meal.  Right shoulder pain - diclofenac sodium (VOLTAREN) 1 % GEL; Apply 4 g topically 4 (four) times daily. - ketorolac (TORADOL) injection 60 mg; Inject 2 mLs (60 mg total) into the muscle once. - methylPREDNISolone sodium succinate (SOLU-MEDROL) 130 mg in sodium chloride 0.9 % 50 mL IVPB; Inject 130 mg into the muscle once.

## 2013-10-18 NOTE — Patient Instructions (Signed)
1)  Shoulder Pain - Apply the Voltaren Gel 4 grams up to 4 x per day along with Tylenol 1000 mg up to 3 x per day.  You received injections of Solu-Medrol and Toradol.  You have an appointment with Dr. Ranell Patrick on January 8 th at 4 pm - be there at 3:30 to check in.  Consider wearing a sling to rest your right shoulder/arm.  If after doing these things you still have trouble then call [facility] to see if you can get worked in to see Dr. Ranell Patrick' PA.      Impingement Syndrome, Rotator Cuff, Bursitis with Rehab Impingement syndrome is a condition that involves inflammation of the tendons of the rotator cuff and the subacromial bursa, that causes pain in the shoulder. The rotator cuff consists of four tendons and muscles that control much of the shoulder and upper arm function. The subacromial bursa is a fluid filled sac that helps reduce friction between the rotator cuff and one of the bones of the shoulder (acromion). Impingement syndrome is usually an overuse injury that causes swelling of the bursa (bursitis), swelling of the tendon (tendonitis), and/or a tear of the tendon (strain). Strains are classified into three categories. Grade 1 strains cause pain, but the tendon is not lengthened. Grade 2 strains include a lengthened ligament, due to the ligament being stretched or partially ruptured. With grade 2 strains there is still function, although the function may be decreased. Grade 3 strains include a complete tear of the tendon or muscle, and function is usually impaired. SYMPTOMS   Pain around the shoulder, often at the outer portion of the upper arm.  Pain that gets worse with shoulder function, especially when reaching overhead or lifting.  Sometimes, aching when not using the arm.  Pain that wakes you up at night.  Sometimes, tenderness, swelling, warmth, or redness over the affected area.  Loss of strength.  Limited motion of the shoulder, especially reaching behind the back (to the  back pocket or to unhook bra) or across your body.  Crackling sound (crepitation) when moving the arm.  Biceps tendon pain and inflammation (in the front of the shoulder). Worse when bending the elbow or lifting. CAUSES  Impingement syndrome is often an overuse injury, in which chronic (repetitive) motions cause the tendons or bursa to become inflamed. A strain occurs when a force is paced on the tendon or muscle that is greater than it can withstand. Common mechanisms of injury include: Stress from sudden increase in duration, frequency, or intensity of training.  Direct hit (trauma) to the shoulder.  Aging, erosion of the tendon with normal use.  Bony bump on shoulder (acromial spur). RISK INCREASES WITH:  Contact sports (football, wrestling, boxing).  Throwing sports (baseball, tennis, volleyball).  Weightlifting and bodybuilding.  Heavy labor.  Previous injury to the rotator cuff, including impingement.  Poor shoulder strength and flexibility.  Failure to warm up properly before activity.  Inadequate protective equipment.  Old age.  Bony bump on shoulder (acromial spur). PREVENTION   Warm up and stretch properly before activity.  Allow for adequate recovery between workouts.  Maintain physical fitness:  Strength, flexibility, and endurance.  Cardiovascular fitness.  Learn and use proper exercise technique. PROGNOSIS  If treated properly, impingement syndrome usually goes away within 6 weeks. Sometimes surgery is required.  RELATED COMPLICATIONS   Longer healing time if not properly treated, or if not given enough time to heal.  Recurring symptoms, that result in a chronic  condition.  Shoulder stiffness, frozen shoulder, or loss of motion.  Rotator cuff tendon tear.  Recurring symptoms, especially if activity is resumed too soon, with overuse, with a direct blow, or when using poor technique. TREATMENT  Treatment first involves the use of ice and  medicine, to reduce pain and inflammation. The use of strengthening and stretching exercises may help reduce pain with activity. These exercises may be performed at home or with a therapist. If non-surgical treatment is unsuccessful after more than 6 months, surgery may be advised. After surgery and rehabilitation, activity is usually possible in 3 months.  MEDICATION  If pain medicine is needed, nonsteroidal anti-inflammatory medicines (aspirin and ibuprofen), or other minor pain relievers (acetaminophen), are often advised.  Do not take pain medicine for 7 days before surgery.  Prescription pain relievers may be given, if your caregiver thinks they are needed. Use only as directed and only as much as you need.  Corticosteroid injections may be given by your caregiver. These injections should be reserved for the most serious cases, because they may only be given a certain number of times. HEAT AND COLD  Cold treatment (icing) should be applied for 10 to 15 minutes every 2 to 3 hours for inflammation and pain, and immediately after activity that aggravates your symptoms. Use ice packs or an ice massage.  Heat treatment may be used before performing stretching and strengthening activities prescribed by your caregiver, physical therapist, or athletic trainer. Use a heat pack or a warm water soak. SEEK MEDICAL CARE IF:   Symptoms get worse or do not improve in 4 to 6 weeks, despite treatment.  New, unexplained symptoms develop. (Drugs used in treatment may produce side effects.) EXERCISES  RANGE OF MOTION (ROM) AND STRETCHING EXERCISES - Impingement Syndrome (Rotator Cuff  Tendinitis, Bursitis) These exercises may help you when beginning to rehabilitate your injury. Your symptoms may go away with or without further involvement from your physician, physical therapist or athletic trainer. While completing these exercises, remember:   Restoring tissue flexibility helps normal motion to return to  the joints. This allows healthier, less painful movement and activity.  An effective stretch should be held for at least 30 seconds.  A stretch should never be painful. You should only feel a gentle lengthening or release in the stretched tissue. STRETCH  Flexion, Standing  Stand with good posture. With an underhand grip on your right / left hand, and an overhand grip on the opposite hand, grasp a broomstick or cane so that your hands are a little more than shoulder width apart.  Keeping your right / left elbow straight and shoulder muscles relaxed, push the stick with your opposite hand, to raise your right / left arm in front of your body and then overhead. Raise your arm until you feel a stretch in your right / left shoulder, but before you have increased shoulder pain.  Try to avoid shrugging your right / left shoulder as your arm rises, by keeping your shoulder blade tucked down and toward your mid-back spine. Hold for __________ seconds.  Slowly return to the starting position. Repeat __________ times. Complete this exercise __________ times per day. STRETCH  Abduction, Supine  Lie on your back. With an underhand grip on your right / left hand and an overhand grip on the opposite hand, grasp a broomstick or cane so that your hands are a little more than shoulder width apart.  Keeping your right / left elbow straight and your shoulder muscles  relaxed, push the stick with your opposite hand, to raise your right / left arm out to the side of your body and then overhead. Raise your arm until you feel a stretch in your right / left shoulder, but before you have increased shoulder pain.  Try to avoid shrugging your right / left shoulder as your arm rises, by keeping your shoulder blade tucked down and toward your mid-back spine. Hold for __________ seconds.  Slowly return to the starting position. Repeat __________ times. Complete this exercise __________ times per day. ROM  Flexion,  Active-Assisted  Lie on your back. You may bend your knees for comfort.  Grasp a broomstick or cane so your hands are about shoulder width apart. Your right / left hand should grip the end of the stick, so that your hand is positioned "thumbs-up," as if you were about to shake hands.  Using your healthy arm to lead, raise your right / left arm overhead, until you feel a gentle stretch in your shoulder. Hold for __________ seconds.  Use the stick to assist in returning your right / left arm to its starting position. Repeat __________ times. Complete this exercise __________ times per day.  ROM - Internal Rotation, Supine   Lie on your back on a firm surface. Place your right / left elbow about 60 degrees away from your side. Elevate your elbow with a folded towel, so that the elbow and shoulder are the same height.  Using a broomstick or cane and your strong arm, pull your right / left hand toward your body until you feel a gentle stretch, but no increase in your shoulder pain. Keep your shoulder and elbow in place throughout the exercise.  Hold for __________ seconds. Slowly return to the starting position. Repeat __________ times. Complete this exercise __________ times per day. STRETCH - Internal Rotation  Place your right / left hand behind your back, palm up.  Throw a towel or belt over your opposite shoulder. Grasp the towel with your right / left hand.  While keeping an upright posture, gently pull up on the towel, until you feel a stretch in the front of your right / left shoulder.  Avoid shrugging your right / left shoulder as your arm rises, by keeping your shoulder blade tucked down and toward your mid-back spine.  Hold for __________ seconds. Release the stretch, by lowering your healthy hand. Repeat __________ times. Complete this exercise __________ times per day. ROM - Internal Rotation   Using an underhand grip, grasp a stick behind your back with both hands.  While  standing upright with good posture, slide the stick up your back until you feel a mild stretch in the front of your shoulder.  Hold for __________ seconds. Slowly return to your starting position. Repeat __________ times. Complete this exercise __________ times per day.  STRETCH  Posterior Shoulder Capsule   Stand or sit with good posture. Grasp your right / left elbow and draw it across your chest, keeping it at the same height as your shoulder.  Pull your elbow, so your upper arm comes in closer to your chest. Pull until you feel a gentle stretch in the back of your shoulder.  Hold for __________ seconds. Repeat __________ times. Complete this exercise __________ times per day. STRENGTHENING EXERCISES - Impingement Syndrome (Rotator Cuff Tendinitis, Bursitis) These exercises may help you when beginning to rehabilitate your injury. They may resolve your symptoms with or without further involvement from your physician, physical therapist  or Event organiser. While completing these exercises, remember:  Muscles can gain both the endurance and the strength needed for everyday activities through controlled exercises.  Complete these exercises as instructed by your physician, physical therapist or athletic trainer. Increase the resistance and repetitions only as guided.  You may experience muscle soreness or fatigue, but the pain or discomfort you are trying to eliminate should never worsen during these exercises. If this pain does get worse, stop and make sure you are following the directions exactly. If the pain is still present after adjustments, discontinue the exercise until you can discuss the trouble with your clinician.  During your recovery, avoid activity or exercises which involve actions that place your injured hand or elbow above your head or behind your back or head. These positions stress the tissues which you are trying to heal. STRENGTH - Scapular Depression and Adduction   With  good posture, sit on a firm chair. Support your arms in front of you, with pillows, arm rests, or on a table top. Have your elbows in line with the sides of your body.  Gently draw your shoulder blades down and toward your mid-back spine. Gradually increase the tension, without tensing the muscles along the top of your shoulders and the back of your neck.  Hold for __________ seconds. Slowly release the tension and relax your muscles completely before starting the next repetition.  After you have practiced this exercise, remove the arm support and complete the exercise in standing as well as sitting position. Repeat __________ times. Complete this exercise __________ times per day.  STRENGTH - Shoulder Abductors, Isometric  With good posture, stand or sit about 4-6 inches from a wall, with your right / left side facing the wall.  Bend your right / left elbow. Gently press your right / left elbow into the wall. Increase the pressure gradually, until you are pressing as hard as you can, without shrugging your shoulder or increasing any shoulder discomfort.  Hold for __________ seconds.  Release the tension slowly. Relax your shoulder muscles completely before you begin the next repetition. Repeat __________ times. Complete this exercise __________ times per day.  STRENGTH - External Rotators, Isometric  Keep your right / left elbow at your side and bend it 90 degrees.  Step into a door frame so that the outside of your right / left wrist can press against the door frame without your upper arm leaving your side.  Gently press your right / left wrist into the door frame, as if you were trying to swing the back of your hand away from your stomach. Gradually increase the tension, until you are pressing as hard as you can, without shrugging your shoulder or increasing any shoulder discomfort.  Hold for __________ seconds.  Release the tension slowly. Relax your shoulder muscles completely before  you begin the next repetition. Repeat __________ times. Complete this exercise __________ times per day.  STRENGTH - Supraspinatus   Stand or sit with good posture. Grasp a __________ weight, or an exercise band or tubing, so that your hand is "thumbs-up," like you are shaking hands.  Slowly lift your right / left arm in a "V" away from your thigh, diagonally into the space between your side and straight ahead. Lift your hand to shoulder height or as far as you can, without increasing any shoulder pain. At first, many people do not lift their hands above shoulder height.  Avoid shrugging your right / left shoulder as your arm  rises, by keeping your shoulder blade tucked down and toward your mid-back spine.  Hold for __________ seconds. Control the descent of your hand, as you slowly return to your starting position. Repeat __________ times. Complete this exercise __________ times per day.  STRENGTH - External Rotators  Secure a rubber exercise band or tubing to a fixed object (table, pole) so that it is at the same height as your right / left elbow when you are standing or sitting on a firm surface.  Stand or sit so that the secured exercise band is at your uninjured side.  Bend your right / left elbow 90 degrees. Place a folded towel or small pillow under your right / left arm, so that your elbow is a few inches away from your side.  Keeping the tension on the exercise band, pull it away from your body, as if pivoting on your elbow. Be sure to keep your body steady, so that the movement is coming only from your rotating shoulder.  Hold for __________ seconds. Release the tension in a controlled manner, as you return to the starting position. Repeat __________ times. Complete this exercise __________ times per day.  STRENGTH - Internal Rotators   Secure a rubber exercise band or tubing to a fixed object (table, pole) so that it is at the same height as your right / left elbow when you are  standing or sitting on a firm surface.  Stand or sit so that the secured exercise band is at your right / left side.  Bend your elbow 90 degrees. Place a folded towel or small pillow under your right / left arm so that your elbow is a few inches away from your side.  Keeping the tension on the exercise band, pull it across your body, toward your stomach. Be sure to keep your body steady, so that the movement is coming only from your rotating shoulder.  Hold for __________ seconds. Release the tension in a controlled manner, as you return to the starting position. Repeat __________ times. Complete this exercise __________ times per day.  STRENGTH - Scapular Protractors, Standing   Stand arms length away from a wall. Place your hands on the wall, keeping your elbows straight.  Begin by dropping your shoulder blades down and toward your mid-back spine.  To strengthen your protractors, keep your shoulder blades down, but slide them forward on your rib cage. It will feel as if you are lifting the back of your rib cage away from the wall. This is a subtle motion and can be challenging to complete. Ask your caregiver for further instruction, if you are not sure you are doing the exercise correctly.  Hold for __________ seconds. Slowly return to the starting position, resting the muscles completely before starting the next repetition. Repeat __________ times. Complete this exercise __________ times per day. STRENGTH - Scapular Protractors, Supine  Lie on your back on a firm surface. Extend your right / left arm straight into the air while holding a __________ weight in your hand.  Keeping your head and back in place, lift your shoulder off the floor.  Hold for __________ seconds. Slowly return to the starting position, and allow your muscles to relax completely before starting the next repetition. Repeat __________ times. Complete this exercise __________ times per day. STRENGTH - Scapular  Protractors, Quadruped  Get onto your hands and knees, with your shoulders directly over your hands (or as close as you can be, comfortably).  Keeping your elbows  locked, lift the back of your rib cage up into your shoulder blades, so your mid-back rounds out. Keep your neck muscles relaxed.  Hold this position for __________ seconds. Slowly return to the starting position and allow your muscles to relax completely before starting the next repetition. Repeat __________ times. Complete this exercise __________ times per day.  STRENGTH - Scapular Retractors  Secure a rubber exercise band or tubing to a fixed object (table, pole), so that it is at the height of your shoulders when you are either standing, or sitting on a firm armless chair.  With a palm down grip, grasp an end of the band in each hand. Straighten your elbows and lift your hands straight in front of you, at shoulder height. Step back, away from the secured end of the band, until it becomes tense.  Squeezing your shoulder blades together, draw your elbows back toward your sides, as you bend them. Keep your upper arms lifted away from your body throughout the exercise.  Hold for __________ seconds. Slowly ease the tension on the band, as you reverse the directions and return to the starting position. Repeat __________ times. Complete this exercise __________ times per day. STRENGTH - Shoulder Extensors   Secure a rubber exercise band or tubing to a fixed object (table, pole) so that it is at the height of your shoulders when you are either standing, or sitting on a firm armless chair.  With a thumbs-up grip, grasp an end of the band in each hand. Straighten your elbows and lift your hands straight in front of you, at shoulder height. Step back, away from the secured end of the band, until it becomes tense.  Squeezing your shoulder blades together, pull your hands down to the sides of your thighs. Do not allow your hands to go  behind you.  Hold for __________ seconds. Slowly ease the tension on the band, as you reverse the directions and return to the starting position. Repeat __________ times. Complete this exercise __________ times per day.  STRENGTH - Scapular Retractors and External Rotators   Secure a rubber exercise band or tubing to a fixed object (table, pole) so that it is at the height as your shoulders, when you are either standing, or sitting on a firm armless chair.  With a palm down grip, grasp an end of the band in each hand. Bend your elbows 90 degrees and lift your elbows to shoulder height, at your sides. Step back, away from the secured end of the band, until it becomes tense.  Squeezing your shoulder blades together, rotate your shoulders so that your upper arms and elbows remain stationary, but your fists travel upward to head height.  Hold for __________ seconds. Slowly ease the tension on the band, as you reverse the directions and return to the starting position. Repeat __________ times. Complete this exercise __________ times per day.  STRENGTH - Scapular Retractors and External Rotators, Rowing   Secure a rubber exercise band or tubing to a fixed object (table, pole) so that it is at the height of your shoulders, when you are either standing, or sitting on a firm armless chair.  With a palm down grip, grasp an end of the band in each hand. Straighten your elbows and lift your hands straight in front of you, at shoulder height. Step back, away from the secured end of the band, until it becomes tense.  Step 1: Squeeze your shoulder blades together. Bending your elbows, draw your hands  to your chest, as if you are rowing a boat. At the end of this motion, your hands and elbow should be at shoulder height and your elbows should be out to your sides.  Step 2: Rotate your shoulders, to raise your hands above your head. Your forearms should be vertical and your upper arms should be  horizontal.  Hold for __________ seconds. Slowly ease the tension on the band, as you reverse the directions and return to the starting position. Repeat __________ times. Complete this exercise __________ times per day.  STRENGTH  Scapular Depressors  Find a sturdy chair without wheels, such as a dining room chair.  Keeping your feet on the floor, and your hands on the chair arms, lift your bottom up from the seat, and lock your elbows.  Keeping your elbows straight, allow gravity to pull your body weight down. Your shoulders will rise toward your ears.  Raise your body against gravity by drawing your shoulder blades down your back, shortening the distance between your shoulders and ears. Although your feet should always maintain contact with the floor, your feet should progressively support less body weight, as you get stronger.  Hold for __________ seconds. In a controlled and slow manner, lower your body weight to begin the next repetition. Repeat __________ times. Complete this exercise __________ times per day.  Document Released: 10/19/2005 Document Revised: 01/11/2012 Document Reviewed: 01/31/2009 [facility] Patient Information 2014 [place], [place].

## 2013-10-31 ENCOUNTER — Other Ambulatory Visit: Payer: Medicare Other

## 2013-11-09 DIAGNOSIS — M25519 Pain in unspecified shoulder: Secondary | ICD-10-CM | POA: Diagnosis not present

## 2013-12-16 ENCOUNTER — Other Ambulatory Visit: Payer: Self-pay | Admitting: Oncology

## 2013-12-29 DIAGNOSIS — H40019 Open angle with borderline findings, low risk, unspecified eye: Secondary | ICD-10-CM | POA: Diagnosis not present

## 2013-12-30 ENCOUNTER — Encounter (HOSPITAL_BASED_OUTPATIENT_CLINIC_OR_DEPARTMENT_OTHER): Payer: Self-pay | Admitting: Emergency Medicine

## 2013-12-30 ENCOUNTER — Emergency Department (HOSPITAL_BASED_OUTPATIENT_CLINIC_OR_DEPARTMENT_OTHER)
Admission: EM | Admit: 2013-12-30 | Discharge: 2013-12-30 | Disposition: A | Discharge status: Home / Self Care | Payer: Medicare Other | Attending: Emergency Medicine | Admitting: Emergency Medicine

## 2013-12-30 DIAGNOSIS — Z87891 Personal history of nicotine dependence: Secondary | ICD-10-CM | POA: Diagnosis not present

## 2013-12-30 DIAGNOSIS — I10 Essential (primary) hypertension: Secondary | ICD-10-CM | POA: Diagnosis not present

## 2013-12-30 DIAGNOSIS — K219 Gastro-esophageal reflux disease without esophagitis: Secondary | ICD-10-CM | POA: Insufficient documentation

## 2013-12-30 DIAGNOSIS — IMO0002 Reserved for concepts with insufficient information to code with codable children: Secondary | ICD-10-CM | POA: Insufficient documentation

## 2013-12-30 DIAGNOSIS — Z880 Allergy status to penicillin: Secondary | ICD-10-CM | POA: Insufficient documentation

## 2013-12-30 DIAGNOSIS — N390 Urinary tract infection, site not specified: Secondary | ICD-10-CM | POA: Diagnosis not present

## 2013-12-30 DIAGNOSIS — Z8501 Personal history of malignant neoplasm of esophagus: Secondary | ICD-10-CM | POA: Insufficient documentation

## 2013-12-30 DIAGNOSIS — E785 Hyperlipidemia, unspecified: Secondary | ICD-10-CM | POA: Diagnosis not present

## 2013-12-30 DIAGNOSIS — Z79899 Other long term (current) drug therapy: Secondary | ICD-10-CM | POA: Insufficient documentation

## 2013-12-30 LAB — URINALYSIS, ROUTINE W REFLEX MICROSCOPIC
Bilirubin Urine: NEGATIVE
Glucose, UA: NEGATIVE mg/dL
Ketones, ur: NEGATIVE mg/dL
Nitrite: NEGATIVE
Protein, ur: 100 mg/dL — AB
Specific Gravity, Urine: 1.009 (ref 1.005–1.030)
Urobilinogen, UA: 0.2 mg/dL (ref 0.0–1.0)
pH: 6 (ref 5.0–8.0)

## 2013-12-30 LAB — URINE MICROSCOPIC-ADD ON

## 2013-12-30 MED ORDER — PHENAZOPYRIDINE HCL 200 MG PO TABS: 200.0000 mg | ORAL_TABLET | Freq: Three times a day (TID) | ORAL | Status: DC | PRN

## 2013-12-30 MED ORDER — CEPHALEXIN 500 MG PO CAPS: 500.0000 mg | ORAL_CAPSULE | Freq: Four times a day (QID) | ORAL | Status: DC

## 2013-12-30 NOTE — ED Notes (Signed)
MD at bedside. 

## 2013-12-30 NOTE — Discharge Instructions (Signed)
Urinary Tract Infection  Urinary tract infections (UTIs) can develop anywhere along your urinary tract. Your urinary tract is your body's drainage system for removing wastes and extra water. Your urinary tract includes two kidneys, two ureters, a bladder, and a urethra. Your kidneys are a pair of bean-shaped organs. Each kidney is about the size of your fist. They are located below your ribs, one on each side of your spine.  CAUSES  Infections are caused by microbes, which are microscopic organisms, including fungi, viruses, and bacteria. These organisms are so small that they can only be seen through a microscope. Bacteria are the microbes that most commonly cause UTIs.  SYMPTOMS   Symptoms of UTIs may vary by age and gender of the patient and by the location of the infection. Symptoms in young women typically include a frequent and intense urge to urinate and a painful, burning feeling in the bladder or urethra during urination. Older women and men are more likely to be tired, shaky, and weak and have muscle aches and abdominal pain. A fever may mean the infection is in your kidneys. Other symptoms of a kidney infection include pain in your back or sides below the ribs, nausea, and vomiting.  DIAGNOSIS  To diagnose a UTI, your caregiver will ask you about your symptoms. Your caregiver also will ask to provide a urine sample. The urine sample will be tested for bacteria and white blood cells. White blood cells are made by your body to help fight infection.  TREATMENT   Typically, UTIs can be treated with medication. Because most UTIs are caused by a bacterial infection, they usually can be treated with the use of antibiotics. The choice of antibiotic and length of treatment depend on your symptoms and the type of bacteria causing your infection.  HOME CARE INSTRUCTIONS   If you were prescribed antibiotics, take them exactly as your caregiver instructs you. Finish the medication even if you feel better after you  have only taken some of the medication.   Drink enough water and fluids to keep your urine clear or pale yellow.   Avoid caffeine, tea, and carbonated beverages. They tend to irritate your bladder.   Empty your bladder often. Avoid holding urine for long periods of time.   Empty your bladder before and after sexual intercourse.   After a bowel movement, women should cleanse from front to back. Use each tissue only once.  SEEK MEDICAL CARE IF:    You have back pain.   You develop a fever.   Your symptoms do not begin to resolve within 3 days.  SEEK IMMEDIATE MEDICAL CARE IF:    You have severe back pain or lower abdominal pain.   You develop chills.   You have nausea or vomiting.   You have continued burning or discomfort with urination.  MAKE SURE YOU:    Understand these instructions.   Will watch your condition.   Will get help right away if you are not doing well or get worse.  Document Released: 07/29/2005 Document Revised: 04/19/2012 Document Reviewed: 11/27/2011  [employer] Patient Information 2014 [employer].

## 2013-12-30 NOTE — ED Notes (Signed)
Patient here with dysuria and hematuria x 1 day, denies back pain, no nausea. Denies fever. Reports frequent urination as well

## 2013-12-30 NOTE — ED Provider Notes (Signed)
CSN: [id number]     Arrival date & time 12/30/13  5462 History   First MD Initiated Contact with Patient 12/30/13 1001     Chief Complaint  Patient presents with  . Dysuria     (Consider location/radiation/quality/duration/timing/severity/associated sxs/prior Treatment) HPI Comments: Patient presents to the ER for evaluation of urinary frequency, dysuria, hematuria which began yesterday. She has not had any nausea, fever. She denies back pain. She has had similar symptoms in the past with urinary tract infections.  Patient is a 68 y.o. female presenting with dysuria.  Dysuria   Past Medical History  Diagnosis Date  . Hypertension   . Hyperlipemia   . GERD (gastroesophageal reflux disease)   . PONV (postoperative nausea and vomiting)   . Dysphagia   . Esophageal cancer    Past Surgical History  Procedure Laterality Date  . Esophagus surgery    . Esophagogastroduodenoscopy  11/17/2011    Procedure: ESOPHAGOGASTRODUODENOSCOPY (EGD);  Surgeon: Owens Loffler, MD;  Location: [facility];  Service: Endoscopy;  Laterality: N/A;  . Eye surgery      both cataracts  . Direct laryngoscopy with botox injection      tx paralysed vocal cord  . Ganglion cyst excision Left 01/26/2013    Procedure: LEFT FLEXOR CARPI RADIALIS  RELEASE AND SCAPHO TRAPEZIAL TRAPEZOID DEBRIDEMENT ;  Surgeon: Tennis Must, MD;  Location: [place];  Service: Orthopedics;  Laterality: Left;   Family History  Problem Relation Age of Onset  . Colon cancer Neg Hx   . Alcohol abuse Sister   . Hyperlipidemia Sister   . Hypertension Sister    History  Substance Use Topics  . Smoking status: Former Smoker    Types: Cigarettes    Quit date: 08/30/1987  . Smokeless tobacco: Never Used  . Alcohol Use: No   OB History   Grav Para Term Preterm Abortions TAB SAB Ect Mult Living                 Review of Systems  Genitourinary: Positive for dysuria, frequency and hematuria.  All other systems  reviewed and are negative.      Allergies  Taxol; Tramadol; Carboplatin; Codeine; Compazine; and Penicillins  Home Medications   Current Outpatient Rx  Name  Route  Sig  Dispense  Refill  . CALCIUM-VITAMIN D PO   Oral   Take 600 mg by mouth daily.          . cetirizine (ZYRTEC) 10 MG tablet   Oral   Take 10 mg by mouth as needed for allergies.         Marland Kitchen diclofenac sodium (VOLTAREN) 1 % GEL   Topical   Apply 4 g topically 4 (four) times daily.   10 Tube   2   . ezetimibe (ZETIA) 10 MG tablet   Oral   Take 1 tablet (10 mg total) by mouth daily.   90 tablet   3   . famotidine (PEPCID) 20 MG tablet   Oral   Take 1 tablet (20 mg total) by mouth 2 (two) times daily.   180 tablet   3   . fluticasone (FLONASE) 50 MCG/ACT nasal spray   Nasal   Place 2 sprays into the nose daily as needed.          . metFORMIN (GLUCOPHAGE) 500 MG tablet   Oral   Take 1 tablet (500 mg total) by mouth 2 (two) times daily with a meal.   60  tablet   3   . Multiple Vitamins-Minerals (MULTIVITAMIN PO)   Oral   Take 1 tablet by mouth daily.          . Omega-3 Fatty Acids (FISH OIL PO)   Oral   Take 1,200 mg by mouth daily.          . pantoprazole (PROTONIX) 40 MG tablet      TAKE 1 TABLET TWICE A DAY   180 tablet   2    BP 158/91  Pulse 98  Temp(Src) 99.2 F (37.3 C)  Resp 18  SpO2 98% Physical Exam  Constitutional: She is oriented to person, place, and time. She appears well-developed and well-nourished. No distress.  HENT:  Head: Normocephalic and atraumatic.  Right Ear: Hearing normal.  Left Ear: Hearing normal.  Nose: Nose normal.  Mouth/Throat: Oropharynx is clear and moist and mucous membranes are normal.  Eyes: Conjunctivae and EOM are normal. Pupils are equal, round, and reactive to light.  Neck: Normal range of motion. Neck supple.  Cardiovascular: Regular rhythm, S1 normal and S2 normal.  Exam reveals no gallop and no friction rub.   No murmur  heard. Pulmonary/Chest: Effort normal and breath sounds normal. No respiratory distress. She exhibits no tenderness.  Abdominal: Soft. Normal appearance and bowel sounds are normal. There is no hepatosplenomegaly. There is no tenderness. There is no rebound, no guarding, no tenderness at McBurney's point and negative Murphy's sign. No hernia.  Musculoskeletal: Normal range of motion.  Neurological: She is alert and oriented to person, place, and time. She has normal strength. No cranial nerve deficit or sensory deficit. Coordination normal. GCS eye subscore is 4. GCS verbal subscore is 5. GCS motor subscore is 6.  Skin: Skin is warm, dry and intact. No rash noted. No cyanosis.  Psychiatric: She has a normal mood and affect. Her speech is normal and behavior is normal. Thought content normal.    ED Course  Procedures (including critical care time) Labs Review Labs Reviewed  URINALYSIS, ROUTINE W REFLEX MICROSCOPIC   Imaging Review No results found.   EKG Interpretation None      MDM   Final diagnoses:  None    Patient presents to the ER for evaluation of hematuria, dysuria, urinary frequency. She does not have any symptoms of pyelonephritis. Urinalysis, however, is obviously consistent with infection. The patient will be treated with Keflex, Pyridium. Return if symptoms worsen, especially fever and vomiting.    Orpah Greek, MD 12/30/13 1026

## 2014-01-25 ENCOUNTER — Ambulatory Visit (INDEPENDENT_AMBULATORY_CARE_PROVIDER_SITE_OTHER): Payer: Medicare Other | Admitting: Family Medicine

## 2014-01-25 ENCOUNTER — Encounter: Payer: Self-pay | Admitting: Family Medicine

## 2014-01-25 VITALS — BP 128/75 | HR 79 | Resp 16 | Wt 117.0 lb

## 2014-01-25 DIAGNOSIS — R300 Dysuria: Secondary | ICD-10-CM | POA: Insufficient documentation

## 2014-01-25 DIAGNOSIS — R7301 Impaired fasting glucose: Secondary | ICD-10-CM | POA: Insufficient documentation

## 2014-01-25 LAB — POCT URINALYSIS DIPSTICK
Bilirubin, UA: NEGATIVE
Blood, UA: NEGATIVE
Glucose, UA: NEGATIVE
Ketones, UA: NEGATIVE
Leukocytes, UA: NEGATIVE
Nitrite, UA: NEGATIVE
Protein, UA: NEGATIVE
Spec Grav, UA: 1.01
Urobilinogen, UA: NEGATIVE
pH, UA: 5

## 2014-01-25 LAB — POCT GLYCOSYLATED HEMOGLOBIN (HGB A1C): Hemoglobin A1C: 6.4

## 2014-01-25 NOTE — Patient Instructions (Addendum)
1)  Bladder Infection - Your urine is clear.    2)  Impaired Fasting Glucose - Stay on the metformin 500 mg in the evening.  Your A1c has decreased from 6.5% to 6.4%.  You might consider taking 2 metformin on days that you are going to be having a lot of carbs/sugar in your diet.

## 2014-01-25 NOTE — Progress Notes (Signed)
Subjective:    Patient ID: Briana Craig, female    DOB: [birth year], 68 y.o.   MRN: [id number]  HPI  Briana Craig is here today to follow up on her recent visit to the [facility].  She was seen on 12/30/13 for UTI symptoms.  Her urinalysis was positive for blood and white cells and she was given a round of Keflex and Pyridium and was instructed to return to her PCP.  She says that all of her urinary symptoms have resolved.   She would like to have her A1C rechecked today.     Review of Systems  Genitourinary: Negative for dysuria, urgency, frequency and difficulty urinating.     Past Medical History  Diagnosis Date  . Hypertension   . Hyperlipemia   . GERD (gastroesophageal reflux disease)   . PONV (postoperative nausea and vomiting)   . Dysphagia   . Esophageal cancer   . Impaired fasting blood sugar      Past Surgical History  Procedure Laterality Date  . Esophagus surgery    . Esophagogastroduodenoscopy  11/17/2011    Procedure: ESOPHAGOGASTRODUODENOSCOPY (EGD);  Surgeon: Owens Loffler, MD;  Location: [facility];  Service: Endoscopy;  Laterality: N/A;  . Eye surgery      both cataracts  . Direct laryngoscopy with botox injection      tx paralysed vocal cord  . Ganglion cyst excision Left 01/26/2013    Procedure: LEFT FLEXOR CARPI RADIALIS  RELEASE AND SCAPHO TRAPEZIAL TRAPEZOID DEBRIDEMENT ;  Surgeon: Tennis Must, MD;  Location: [place];  Service: Orthopedics;  Laterality: Left;     History   Social History Narrative   Marital Status:  Married Briana Craig)   Children:  None    Pets: Dog (1)    Living Situation: Lives with husband.   Occupation:  Retired Visual merchandiser)    Education: Master's Degree    Tobacco Use/Exposure:  Formal Smoker    Alcohol Use:  Occasional   Drug Use:  None   Diet:  Regular   Exercise:  Walking 1 mile 5 times per week.    Hobbies:  Reading                  Family History  Problem Relation Age of Onset  . Colon cancer  Neg Hx   . Alcohol abuse Sister   . Hyperlipidemia Sister   . Hypertension Sister   . AAA (abdominal aortic aneurysm) Mother   . COPD Father      Current Outpatient Prescriptions on File Prior to Visit  Medication Sig Dispense Refill  . CALCIUM-VITAMIN D PO Take 600 mg by mouth daily.       . cetirizine (ZYRTEC) 10 MG tablet Take 10 mg by mouth as needed for allergies.      Marland Kitchen ezetimibe (ZETIA) 10 MG tablet Take 1 tablet (10 mg total) by mouth daily.  90 tablet  3  . famotidine (PEPCID) 20 MG tablet Take 1 tablet (20 mg total) by mouth 2 (two) times daily.  180 tablet  3  . fluticasone (FLONASE) 50 MCG/ACT nasal spray Place 2 sprays into the nose daily as needed.       . metFORMIN (GLUCOPHAGE) 500 MG tablet Take 1 tablet (500 mg total) by mouth 2 (two) times daily with a meal.  60 tablet  3  . Multiple Vitamins-Minerals (MULTIVITAMIN PO) Take 1 tablet by mouth daily.       . Omega-3 Fatty Acids (FISH OIL  PO) Take 1,200 mg by mouth daily.       . pantoprazole (PROTONIX) 40 MG tablet TAKE 1 TABLET TWICE A DAY  180 tablet  2   No current facility-administered medications on file prior to visit.     Allergies  Allergen Reactions  . Taxol [Paclitaxel] Hives and Itching  . Tramadol     "Sweaty, shaky and faint"  . Carboplatin Hives and Itching  . Codeine   . Compazine   . Penicillins      Immunization History  Administered Date(s) Administered  . Tdap 05/01/2013      Objective:   Physical Exam  Vitals reviewed. Constitutional: She appears well-nourished. No distress.  Cardiovascular: Normal rate and regular rhythm.   Pulmonary/Chest: Effort normal and breath sounds normal.  Abdominal: There is no tenderness.  Skin: No rash noted.      Assessment & Plan:   Briana Craig was seen today for follow-up.  Diagnoses and associated orders for this visit:  IFG (impaired fasting glucose) Comments: Her A1c is slightly improved from 6.5% to 6.4%.   - POCT glycosylated hemoglobin (Hb  A1C)  Dysuria Comments: Her urinary symptoms have resolved.   - POCT urinalysis dipstick   TIME SPENT "FACE TO FACE" WITH PATIENT -  15 MINS

## 2014-04-13 ENCOUNTER — Other Ambulatory Visit: Payer: Self-pay | Admitting: *Deleted

## 2014-04-13 DIAGNOSIS — E785 Hyperlipidemia, unspecified: Secondary | ICD-10-CM

## 2014-04-16 ENCOUNTER — Other Ambulatory Visit: Payer: Medicare Other

## 2014-04-16 DIAGNOSIS — E785 Hyperlipidemia, unspecified: Secondary | ICD-10-CM | POA: Diagnosis not present

## 2014-04-16 LAB — LIPID PANEL
Cholesterol: 235 mg/dL — ABNORMAL HIGH (ref 0–200)
HDL: 88 mg/dL (ref >39)
LDL Cholesterol: 124 mg/dL — ABNORMAL HIGH (ref 0–99)
Total CHOL/HDL Ratio: 2.7 Ratio
Triglycerides: 114 mg/dL (ref <150)
VLDL: 23 mg/dL (ref 0–40)

## 2014-04-16 LAB — COMPLETE METABOLIC PANEL WITH GFR
ALT: 17 U/L (ref 0–35)
AST: 23 U/L (ref 0–37)
Albumin: 4.3 g/dL (ref 3.5–5.2)
Alkaline Phosphatase: 52 U/L (ref 39–117)
BUN: 22 mg/dL (ref 6–23)
CO2: 27 mEq/L (ref 19–32)
Calcium: 10 mg/dL (ref 8.4–10.5)
Chloride: 104 mEq/L (ref 96–112)
Creat: 1.07 mg/dL (ref 0.50–1.10)
GFR, Est African American: 61 mL/min
GFR, Est Non African American: 53 mL/min — ABNORMAL LOW
Glucose, Bld: 116 mg/dL — ABNORMAL HIGH (ref 70–99)
Potassium: 4.7 mEq/L (ref 3.5–5.3)
Sodium: 140 mEq/L (ref 135–145)
Total Bilirubin: 0.7 mg/dL (ref 0.2–1.2)
Total Protein: 7.3 g/dL (ref 6.0–8.3)

## 2014-04-23 ENCOUNTER — Ambulatory Visit (INDEPENDENT_AMBULATORY_CARE_PROVIDER_SITE_OTHER): Payer: Medicare Other | Admitting: Family Medicine

## 2014-04-23 ENCOUNTER — Encounter: Payer: Self-pay | Admitting: Family Medicine

## 2014-04-23 VITALS — BP 142/91 | HR 80 | Resp 16 | Ht 61.5 in | Wt 116.0 lb

## 2014-04-23 DIAGNOSIS — R7301 Impaired fasting glucose: Secondary | ICD-10-CM

## 2014-04-23 DIAGNOSIS — E785 Hyperlipidemia, unspecified: Secondary | ICD-10-CM | POA: Diagnosis not present

## 2014-04-23 DIAGNOSIS — K219 Gastro-esophageal reflux disease without esophagitis: Secondary | ICD-10-CM | POA: Diagnosis not present

## 2014-04-23 MED ORDER — FAMOTIDINE 20 MG PO TABS: 20.0000 mg | ORAL_TABLET | Freq: Two times a day (BID) | ORAL | Status: DC

## 2014-04-23 MED ORDER — EZETIMIBE 10 MG PO TABS: 10.0000 mg | ORAL_TABLET | Freq: Every day | ORAL | Status: DC

## 2014-04-23 NOTE — Progress Notes (Signed)
Subjective:    Patient ID: Briana Craig, female    DOB: Mar 03, 1946, 68 y.o.   MRN: [id number]  HPI   Briana Craig is here today to go over her recent lab results. She is needing some medication refills. She has been doing well since her last visit. She will be following up with her oncologist at [facility] this week for her 3 year CT scan.   1)  Hyperlipidemia:  She is doing well on the Zetia and the fish oil. She is needing refill.  2)  GERD:  She takes Protonix and Pepcid. She is needing refills on both these.   3)  IFG:  She stopped taking the metformin about 5 weeks ago because she stayed too nauseated on it.     Review of Systems  Constitutional: Negative for activity change, appetite change and fatigue.  Cardiovascular: Negative for chest pain, palpitations and leg swelling.  Endocrine: Negative for polydipsia, polyphagia and polyuria.  Psychiatric/Behavioral: Negative for behavioral problems. The patient is not nervous/anxious.   All other systems reviewed and are negative.   Past Medical History  Diagnosis Date  . Hypertension   . Hyperlipemia   . GERD (gastroesophageal reflux disease)   . PONV (postoperative nausea and vomiting)   . Dysphagia   . Esophageal cancer   . Impaired fasting blood sugar      Past Surgical History  Procedure Laterality Date  . Esophagus surgery    . Esophagogastroduodenoscopy  11/17/2011    Procedure: ESOPHAGOGASTRODUODENOSCOPY (EGD);  Surgeon: Owens Loffler, MD;  Location: [facility];  Service: Endoscopy;  Laterality: N/A;  . Eye surgery      both cataracts  . Direct laryngoscopy with botox injection      tx paralysed vocal cord  . Ganglion cyst excision Left 01/26/2013    Procedure: LEFT FLEXOR CARPI RADIALIS  RELEASE AND SCAPHO TRAPEZIAL TRAPEZOID DEBRIDEMENT ;  Surgeon: Tennis Must, MD;  Location: [place];  Service: Orthopedics;  Laterality: Left;     History   Social History Narrative   Marital Status:  Married  Sonia Side)   Children:  None    Pets: Dog (1)    Living Situation: Lives with husband.   Occupation:  Retired Visual merchandiser)    Education: Master's Degree    Tobacco Use/Exposure:  Formal Smoker    Alcohol Use:  Occasional   Drug Use:  None   Diet:  Regular   Exercise:  Walking 1 mile 5 times per week.    Hobbies:  Reading                  Family History  Problem Relation Age of Onset  . Colon cancer Neg Hx   . Alcohol abuse Sister   . Hyperlipidemia Sister   . Hypertension Sister   . AAA (abdominal aortic aneurysm) Mother   . COPD Father      Current Outpatient Prescriptions on File Prior to Visit  Medication Sig Dispense Refill  . CALCIUM-VITAMIN D PO Take 600 mg by mouth daily.       . cetirizine (ZYRTEC) 10 MG tablet Take 10 mg by mouth as needed for allergies.      . fluticasone (FLONASE) 50 MCG/ACT nasal spray Place 2 sprays into the nose daily as needed.       . Multiple Vitamins-Minerals (MULTIVITAMIN PO) Take 1 tablet by mouth daily.       . Omega-3 Fatty Acids (FISH OIL PO) Take 1,200 mg by  mouth daily.       . pantoprazole (PROTONIX) 40 MG tablet TAKE 1 TABLET TWICE A DAY  180 tablet  2   No current facility-administered medications on file prior to visit.     Allergies  Allergen Reactions  . Taxol [Paclitaxel] Hives and Itching  . Tramadol     "Sweaty, shaky and faint"  . Carboplatin Hives and Itching  . Codeine   . Compazine   . Penicillins      Immunization History  Administered Date(s) Administered  . Tdap 05/01/2013      Objective:   Physical Exam  Vitals reviewed. Constitutional: She appears well-nourished. No distress.  Cardiovascular: Normal rate and regular rhythm.   Pulmonary/Chest: Effort normal and breath sounds normal.  Abdominal: There is no tenderness.  Skin: No rash noted.  Psychiatric: She has a normal mood and affect. Her behavior is normal. Judgment and thought content normal.       Assessment & Plan:    Briana Craig was seen  today for medication management.  Diagnoses and associated orders for this visit:  Other and unspecified hyperlipidemia - ezetimibe (ZETIA) 10 MG tablet; Take 1 tablet (10 mg total) by mouth daily.  Impaired fasting glucose Comments: She is to work harder on limiting her sugar/carb intake.    Gastroesophageal reflux disease without esophagitis - famotidine (PEPCID) 20 MG tablet; Take 1 tablet (20 mg total) by mouth 2 (two) times daily.   TIME SPENT "FACE TO FACE" WITH PATIENT -  30 MINS

## 2014-04-26 DIAGNOSIS — R918 Other nonspecific abnormal finding of lung field: Secondary | ICD-10-CM | POA: Diagnosis not present

## 2014-04-26 DIAGNOSIS — J984 Other disorders of lung: Secondary | ICD-10-CM | POA: Diagnosis not present

## 2014-04-26 DIAGNOSIS — C159 Malignant neoplasm of esophagus, unspecified: Secondary | ICD-10-CM | POA: Diagnosis not present

## 2014-04-26 DIAGNOSIS — Z8501 Personal history of malignant neoplasm of esophagus: Secondary | ICD-10-CM | POA: Diagnosis not present

## 2014-04-26 DIAGNOSIS — Z09 Encounter for follow-up examination after completed treatment for conditions other than malignant neoplasm: Secondary | ICD-10-CM | POA: Diagnosis not present

## 2014-06-27 DIAGNOSIS — H40019 Open angle with borderline findings, low risk, unspecified eye: Secondary | ICD-10-CM | POA: Diagnosis not present

## 2014-08-16 DIAGNOSIS — Z23 Encounter for immunization: Secondary | ICD-10-CM | POA: Diagnosis not present

## 2014-09-03 ENCOUNTER — Encounter: Payer: Self-pay | Admitting: Family Medicine

## 2014-09-03 ENCOUNTER — Ambulatory Visit (INDEPENDENT_AMBULATORY_CARE_PROVIDER_SITE_OTHER): Payer: Medicare Other | Admitting: Family Medicine

## 2014-09-03 VITALS — BP 126/72 | HR 81 | Temp 98.0°F | Wt 120.4 lb

## 2014-09-03 DIAGNOSIS — Z23 Encounter for immunization: Secondary | ICD-10-CM

## 2014-09-03 DIAGNOSIS — C159 Malignant neoplasm of esophagus, unspecified: Secondary | ICD-10-CM

## 2014-09-03 DIAGNOSIS — E785 Hyperlipidemia, unspecified: Secondary | ICD-10-CM | POA: Diagnosis not present

## 2014-09-03 NOTE — Progress Notes (Signed)
Subjective:    Patient ID: Briana Craig, female    DOB: 1946/10/18, 68 y.o.   MRN: [id number]  HPI Pt is here with her husband to establish care.  She is 4 yrs out with esophageal CA stage 3.  She is followed by [facility] and [facility]. Pt is requesting labs be done because they have not been done in a while.  No new complaints.     Review of Systems    as above  Past Medical History  Diagnosis Date  . Hypertension   . Hyperlipemia   . GERD (gastroesophageal reflux disease)   . PONV (postoperative nausea and vomiting)   . Dysphagia   . Esophageal cancer   . Impaired fasting blood sugar    History   Social History  . Marital Status: Married    Spouse Name: Sonia Side    Number of Children: 0  . Years of Education: 18   Occupational History  . RETIRED  Other   Social History Main Topics  . Smoking status: Former Smoker    Types: Cigarettes    Quit date: 08/30/1987  . Smokeless tobacco: Never Used  . Alcohol Use: No  . Drug Use: Not on file  . Sexual Activity:    Partners: Male    Birth Control/ Protection: Post-menopausal   Other Topics Concern  . Not on file   Social History Narrative   Marital Status:  Married Sonia Side)   Children:  None    Pets: Dog (1)    Living Situation: Lives with husband.   Occupation:  Retired Visual merchandiser)    Education: Master's Degree    Tobacco Use/Exposure:  Formal Smoker    Alcohol Use:  Occasional   Drug Use:  None   Diet:  Regular   Exercise:  Walking 1 mile 5 times per week.    Hobbies:  Reading                Family History  Problem Relation Age of Onset  . Colon cancer Neg Hx   . Alcohol abuse Sister   . Hyperlipidemia Sister   . Hypertension Sister   . AAA (abdominal aortic aneurysm) Mother   . COPD Father    Current Outpatient Prescriptions  Medication Sig Dispense Refill  . CALCIUM-VITAMIN D PO Take 600 mg by mouth daily.     . cetirizine (ZYRTEC) 10 MG tablet Take 10 mg by mouth as needed for allergies.     Marland Kitchen ezetimibe (ZETIA) 10 MG tablet Take 1 tablet (10 mg total) by mouth daily. 90 tablet 3  . famotidine (PEPCID) 20 MG tablet Take 1 tablet (20 mg total) by mouth 2 (two) times daily. 180 tablet 3  . fluticasone (FLONASE) 50 MCG/ACT nasal spray Place 2 sprays into the nose daily as needed.     . Multiple Vitamins-Minerals (MULTIVITAMIN PO) Take 1 tablet by mouth daily.     . Omega-3 Fatty Acids (FISH OIL PO) Take 1,200 mg by mouth daily.     . pantoprazole (PROTONIX) 40 MG tablet TAKE 1 TABLET TWICE A DAY 180 tablet 2   No current facility-administered medications for this visit.   Past Surgical History  Procedure Laterality Date  . Esophagus surgery    . Esophagogastroduodenoscopy  11/17/2011    Procedure: ESOPHAGOGASTRODUODENOSCOPY (EGD);  Surgeon: Owens Loffler, MD;  Location: [facility];  Service: Endoscopy;  Laterality: N/A;  . Eye surgery      both cataracts  . Direct laryngoscopy  with botox injection      tx paralysed vocal cord  . Ganglion cyst excision Left 01/26/2013    Procedure: LEFT FLEXOR CARPI RADIALIS  RELEASE AND SCAPHO TRAPEZIAL TRAPEZOID DEBRIDEMENT ;  Surgeon: Tennis Must, MD;  Location: [place];  Service: Orthopedics;  Laterality: Left;   Allergies  Allergen Reactions  . Taxol [Paclitaxel] Hives and Itching  . Tramadol     "Sweaty, shaky and faint"  . Carboplatin Hives and Itching  . Codeine   . Compazine   . Penicillins     Objective:   Physical Exam BP 126/72 mmHg  Pulse 81  Temp(Src) 98 F (36.7 C) (Oral)  Wt 120 lb 6.4 oz (54.613 kg)  SpO2 99% General appearance: alert, cooperative, appears stated age and no distress Throat: lips, mucosa, and tongue normal; teeth and gums normal Neck: no adenopathy, supple, symmetrical, trachea midline and thyroid not enlarged, symmetric, no tenderness/mass/nodules Lungs: clear to auscultation bilaterally Heart: S1, S2 normal Extremities: extremities normal, atraumatic, no cyanosis or  edema        Assessment & Plan:  1. Hyperlipidemia Check labs Pt is on no meds - Basic metabolic panel; Future - CBC with Differential; Future - Hemoglobin A1c; Future - Hepatic function panel; Future - Lipid panel; Future - POCT urinalysis dipstick; Future  2. Esophageal cancer Per oncology --- [facility] and [facility] - Basic metabolic panel; Future - CBC with Differential; Future - Hemoglobin A1c; Future - Hepatic function panel; Future - Lipid panel; Future - POCT urinalysis dipstick; Future  3. Hyperglycemia-- con't diet      Watch sugars and starches

## 2014-09-03 NOTE — Progress Notes (Signed)
Pre visit review using our clinic review tool, if applicable. No additional management support is needed unless otherwise documented below in the visit note. 

## 2014-09-03 NOTE — Progress Notes (Signed)
Pt tolerated injection well.  No signs of reaction leaving the clinic.

## 2014-09-03 NOTE — Patient Instructions (Signed)

## 2014-09-04 ENCOUNTER — Telehealth: Payer: Self-pay | Admitting: Oncology

## 2014-09-04 NOTE — Telephone Encounter (Signed)
s/w pt pt re new appt for 11/24 moved from 11/20.

## 2014-09-21 ENCOUNTER — Ambulatory Visit: Payer: Medicare Other | Admitting: Oncology

## 2014-09-25 ENCOUNTER — Ambulatory Visit (HOSPITAL_BASED_OUTPATIENT_CLINIC_OR_DEPARTMENT_OTHER): Payer: Medicare Other | Admitting: Oncology

## 2014-09-25 ENCOUNTER — Telehealth: Payer: Self-pay | Admitting: Oncology

## 2014-09-25 VITALS — BP 139/84 | HR 78 | Temp 97.8°F | Resp 19 | Ht 61.5 in | Wt 121.0 lb

## 2014-09-25 DIAGNOSIS — Z8501 Personal history of malignant neoplasm of esophagus: Secondary | ICD-10-CM

## 2014-09-25 DIAGNOSIS — C159 Malignant neoplasm of esophagus, unspecified: Secondary | ICD-10-CM

## 2014-09-25 NOTE — Telephone Encounter (Signed)
gv and printed appt sched and avs for pt for NOV 2016

## 2014-09-25 NOTE — Progress Notes (Signed)
  [place] OFFICE PROGRESS NOTE   Diagnosis: Esophagus cancer  INTERVAL HISTORY:   Briana Craig returns as scheduled. She feels well. Good appetite. No significant dysphagia. She has reflux when supine. A restaging chest CT at [facility] on 04/26/2014 revealed 2 new pulmonary nodules felt to potentially related to underlying infection/aspiration. She is scheduled for a follow-up CT next month.  Objective:  Vital signs in last 24 hours:  Blood pressure 139/84, pulse 78, temperature 97.8 F (36.6 C), temperature source Oral, resp. rate 19, height 5' 1.5" (1.562 m), weight 121 lb (54.885 kg).    HEENT: Neck without mass Lymphatics: No cervical, supraclavicular, or axillary nodes Resp: Lungs clear bilaterally Cardio: Regular rate and rhythm GI: No hepatomegaly, nontender, no mass Vascular: No leg edema   Medications: I have reviewed the patient's current medications.  Assessment/Plan: 1. 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 A staging CT and PET scan revealed no evidence for distant metastatic disease. An endoscopic ultrasound on 01/30/2011 confirmed a uT3, uN1 tumor. She began radiation and concurrent weekly Taxol/carboplatin chemotherapy on 02/16/2011. She completed the course of radiation on 03/25/2011. The last chemotherapy was given 03/16/2011. She is status post an esophagogastrectomy on 05/14/2011 by Dr. Elenor Quinones at [facility]. Per Dr. Florentina Jenny office note dated 07/02/2011, pathology showed a 0.7 cm adenocarcinoma with signet ring cell features, high grade. All lymph nodes negative and margins negative (T3 N0). Restaging CT 10/20/2011 at [facility] showed no evidence of recurrent esophagus cancer. CT at [facility] on 04/07/2012 revealed evidence of aspiration/inflammation with a 0.9 cm nodular opacity in the lingula. Restaging chest CT at [facility] on 10/06/2012 showed no evidence of locally recurrent or metastatic disease. Restaging CT at [facility] on 04/20/2013 without  evidence of recurrent esophagus cancer.  Chest CT at [facility] 04/26/2014 with stable treated in bud opacities, 2 new nodules 2. History of iron-deficiency anemia secondary to gastrointestinal blood loss from the esophagus cancer. 3. History of solid dysphagia, improved following neoadjuvant therapy.  4. Odynophagia secondary to the esophagus tumor, improved following neoadjuvant therapy. 5. History of hypertension. 6. Right ovarian cyst. 7. Skin rash following chemotherapy with week number 4 and week number 5 of Taxol/carboplatin. We felt the skin rash was most likely related to carboplatin though the rash could have also been due to Taxol. Both Taxol and carboplatin are now listed as allergies. 8. History of thrombocytopenia secondary to chemotherapy and radiation. 9. History of subcutaneous emphysema at the upper chest bilaterally following esophagogastrectomy. 10. Left vocal cord paralysis and hoarseness status post radius vocal fold injections by Dr. Redmond Baseman 08/17/2011. Improved. 11. CT 10/20/2011 at [facility] with pulmonary parenchymal opacities concerning for aspiration. Increased on a [facility] CT 04/07/2012, stable 04/20/2013 12. EGD on 11/17/2011. The GE anastomosis very proximal; significant amount of stomach in the chest; visible sutures, staples at the rim like anastomosis. 13. Probable GERD. She continues Protonix and Pepcid.   Disposition:  Ms. Karapetian remains in clinical remission from esophagus cancer. She will see Dr. Elenor Quinones with a repeat CT next month. She will contact us if there are significant findings on the CT. Ms. Perdue would like to continue follow-up at the [facility]. She will be scheduled for a return visit in one year.  Betsy Coder, MD  09/25/2014  3:17 PM

## 2014-10-18 DIAGNOSIS — Z9889 Other specified postprocedural states: Secondary | ICD-10-CM | POA: Diagnosis not present

## 2014-10-18 DIAGNOSIS — C159 Malignant neoplasm of esophagus, unspecified: Secondary | ICD-10-CM | POA: Diagnosis not present

## 2014-10-18 DIAGNOSIS — Z8501 Personal history of malignant neoplasm of esophagus: Secondary | ICD-10-CM | POA: Diagnosis not present

## 2014-10-18 DIAGNOSIS — R918 Other nonspecific abnormal finding of lung field: Secondary | ICD-10-CM | POA: Diagnosis not present

## 2014-10-18 DIAGNOSIS — I2510 Atherosclerotic heart disease of native coronary artery without angina pectoris: Secondary | ICD-10-CM | POA: Diagnosis not present

## 2014-10-18 DIAGNOSIS — J984 Other disorders of lung: Secondary | ICD-10-CM | POA: Diagnosis not present

## 2014-10-22 ENCOUNTER — Telehealth: Payer: Self-pay | Admitting: Family Medicine

## 2014-10-22 DIAGNOSIS — K219 Gastro-esophageal reflux disease without esophagitis: Secondary | ICD-10-CM

## 2014-10-22 MED ORDER — FAMOTIDINE 20 MG PO TABS: 20.0000 mg | ORAL_TABLET | Freq: Two times a day (BID) | ORAL | Status: DC

## 2014-10-22 NOTE — Telephone Encounter (Signed)
Pt states to sent it [facility]. Thank you.

## 2014-10-22 NOTE — Telephone Encounter (Signed)
Caller name: Ireanna Finlayson Relation to pt: Call back number: [phone] Pharmacy:  Reason for call: Pt came in office asking for rx Famotidine Tabs 20 mg 1 tablet twice a day. Pt states has only 1 wk supply left. Pt states has appt set for 12-06-14. Please advice.

## 2014-10-22 NOTE — Telephone Encounter (Signed)
Rx sent      KP 

## 2014-11-13 ENCOUNTER — Other Ambulatory Visit (INDEPENDENT_AMBULATORY_CARE_PROVIDER_SITE_OTHER): Payer: Medicare Other

## 2014-11-13 DIAGNOSIS — I10 Essential (primary) hypertension: Secondary | ICD-10-CM

## 2014-11-13 DIAGNOSIS — Z79899 Other long term (current) drug therapy: Secondary | ICD-10-CM

## 2014-11-13 DIAGNOSIS — C159 Malignant neoplasm of esophagus, unspecified: Secondary | ICD-10-CM

## 2014-11-13 DIAGNOSIS — E785 Hyperlipidemia, unspecified: Secondary | ICD-10-CM | POA: Diagnosis not present

## 2014-11-13 LAB — HEPATIC FUNCTION PANEL
ALT: 15 U/L (ref 0–35)
AST: 23 U/L (ref 0–37)
Albumin: 4 g/dL (ref 3.5–5.2)
Alkaline Phosphatase: 49 U/L (ref 39–117)
Bilirubin, Direct: 0 mg/dL (ref 0.0–0.3)
Total Bilirubin: 0.6 mg/dL (ref 0.2–1.2)
Total Protein: 7.2 g/dL (ref 6.0–8.3)

## 2014-11-13 LAB — BASIC METABOLIC PANEL
BUN: 20 mg/dL (ref 6–23)
CO2: 24 mEq/L (ref 19–32)
Calcium: 9.4 mg/dL (ref 8.4–10.5)
Chloride: 107 mEq/L (ref 96–112)
Creatinine, Ser: 1.1 mg/dL (ref 0.4–1.2)
GFR: 55.06 mL/min — ABNORMAL LOW (ref >60.00)
Glucose, Bld: 109 mg/dL — ABNORMAL HIGH (ref 70–99)
Potassium: 4.2 mEq/L (ref 3.5–5.1)
Sodium: 137 mEq/L (ref 135–145)

## 2014-11-13 LAB — CBC WITH DIFFERENTIAL/PLATELET
Basophils Absolute: 0 10*3/uL (ref 0.0–0.1)
Basophils Relative: 0.6 % (ref 0.0–3.0)
Eosinophils Absolute: 0.2 10*3/uL (ref 0.0–0.7)
Eosinophils Relative: 3.8 % (ref 0.0–5.0)
HCT: 39.2 % (ref 36.0–46.0)
Hemoglobin: 12.9 g/dL (ref 12.0–15.0)
Lymphocytes Relative: 31.8 % (ref 12.0–46.0)
Lymphs Abs: 1.7 10*3/uL (ref 0.7–4.0)
MCHC: 33 g/dL (ref 30.0–36.0)
MCV: 89.6 fl (ref 78.0–100.0)
Monocytes Absolute: 0.7 10*3/uL (ref 0.1–1.0)
Monocytes Relative: 13 % — ABNORMAL HIGH (ref 3.0–12.0)
Neutro Abs: 2.7 10*3/uL (ref 1.4–7.7)
Neutrophils Relative %: 50.8 % (ref 43.0–77.0)
Platelets: 233 10*3/uL (ref 150.0–400.0)
RBC: 4.37 Mil/uL (ref 3.87–5.11)
RDW: 15.1 % (ref 11.5–15.5)
WBC: 5.4 10*3/uL (ref 4.0–10.5)

## 2014-11-13 LAB — LIPID PANEL
Cholesterol: 213 mg/dL — ABNORMAL HIGH (ref 0–200)
HDL: 77.3 mg/dL (ref >39.00)
LDL Cholesterol: 119 mg/dL — ABNORMAL HIGH (ref 0–99)
NonHDL: 135.7
Total CHOL/HDL Ratio: 3
Triglycerides: 83 mg/dL (ref 0.0–149.0)
VLDL: 16.6 mg/dL (ref 0.0–40.0)

## 2014-11-13 LAB — HEMOGLOBIN A1C: Hgb A1c MFr Bld: 7.1 % — ABNORMAL HIGH (ref 4.6–6.5)

## 2014-12-06 ENCOUNTER — Encounter: Payer: Self-pay | Admitting: Family Medicine

## 2014-12-06 ENCOUNTER — Telehealth: Payer: Self-pay | Admitting: Family Medicine

## 2014-12-06 ENCOUNTER — Ambulatory Visit (INDEPENDENT_AMBULATORY_CARE_PROVIDER_SITE_OTHER): Payer: Medicare Other | Admitting: Family Medicine

## 2014-12-06 VITALS — BP 128/88 | HR 71 | Temp 98.1°F | Wt 119.2 lb

## 2014-12-06 DIAGNOSIS — E785 Hyperlipidemia, unspecified: Secondary | ICD-10-CM

## 2014-12-06 DIAGNOSIS — IMO0002 Reserved for concepts with insufficient information to code with codable children: Secondary | ICD-10-CM

## 2014-12-06 DIAGNOSIS — E1165 Type 2 diabetes mellitus with hyperglycemia: Secondary | ICD-10-CM

## 2014-12-06 DIAGNOSIS — E1151 Type 2 diabetes mellitus with diabetic peripheral angiopathy without gangrene: Secondary | ICD-10-CM | POA: Insufficient documentation

## 2014-12-06 MED ORDER — GLUCOSE BLOOD VI STRP: ORAL_STRIP | Status: DC

## 2014-12-06 NOTE — Progress Notes (Signed)
Pre visit review using our clinic review tool, if applicable. No additional management support is needed unless otherwise documented below in the visit note. 

## 2014-12-06 NOTE — Assessment & Plan Note (Signed)
Recheck labs in april

## 2014-12-06 NOTE — Assessment & Plan Note (Signed)
Recheck labs in end march/ April con't to check glucose few x a week

## 2014-12-06 NOTE — Telephone Encounter (Signed)
Caller name: Vicente Males from [facility] [address] Relation to pt: Call back number: [phone] Pharmacy:  Reason for call:   Need ICD10 dx code for one touch ultra test strips

## 2014-12-06 NOTE — Progress Notes (Signed)
   Subjective:    Patient ID: Briana Craig, female    DOB: 09-Dec-1945, 69 y.o.   MRN: [id number]  HPI  Patient here for dm , htn, and cholesterol f/u.  Past Medical History  Diagnosis Date  . Hypertension   . Hyperlipemia   . GERD (gastroesophageal reflux disease)   . PONV (postoperative nausea and vomiting)   . Dysphagia   . Esophageal cancer   . Impaired fasting blood sugar     Review of Systems  Constitutional: Positive for fatigue. Negative for activity change, appetite change and unexpected weight change.  Respiratory: Negative for cough and shortness of breath.   Cardiovascular: Negative for chest pain, palpitations and leg swelling.  Psychiatric/Behavioral: Negative for suicidal ideas, hallucinations, behavioral problems, confusion, sleep disturbance, self-injury, dysphoric mood, decreased concentration and agitation. The patient is not nervous/anxious and is not hyperactive.        Objective:    Physical Exam  Constitutional: She is oriented to person, place, and time. She appears well-developed and well-nourished.  HENT:  Head: Normocephalic and atraumatic.  Eyes: Conjunctivae and EOM are normal.  Neck: Normal range of motion. Neck supple. No JVD present. Carotid bruit is not present. No thyromegaly present.  Cardiovascular: Normal rate, regular rhythm and normal heart sounds.   No murmur heard. Pulmonary/Chest: Effort normal and breath sounds normal. No respiratory distress. She has no wheezes. She has no rales. She exhibits no tenderness.  Musculoskeletal: She exhibits no edema or tenderness.  Sensory exam of the foot is normal, tested with the monofilament. Good pulses, no lesions or ulcers, good peripheral pulses.   Neurological: She is alert and oriented to person, place, and time.  Psychiatric: She has a normal mood and affect. Her behavior is normal. Judgment and thought content normal.    BP 128/88 mmHg  Pulse 71  Temp(Src) 98.1 F (36.7 C) (Oral)   Wt 119 lb 3.2 oz (54.069 kg)  SpO2 98% Wt Readings from Last 3 Encounters:  12/06/14 119 lb 3.2 oz (54.069 kg)  09/25/14 121 lb (54.885 kg)  09/03/14 120 lb 6.4 oz (54.613 kg)     Lab Results  Component Value Date   WBC 5.4 11/13/2014   HGB 12.9 11/13/2014   HCT 39.2 11/13/2014   PLT 233.0 11/13/2014   GLUCOSE 109* 11/13/2014   CHOL 213* 11/13/2014   TRIG 83.0 11/13/2014   HDL 77.30 11/13/2014   LDLCALC 119* 11/13/2014   ALT 15 11/13/2014   AST 23 11/13/2014   NA 137 11/13/2014   K 4.2 11/13/2014   CL 107 11/13/2014   CREATININE 1.1 11/13/2014   BUN 20 11/13/2014   CO2 24 11/13/2014   TSH 2.147 04/24/2013   HGBA1C 7.1* 11/13/2014    No results found.     Assessment & Plan:   Problem List Items Addressed This Visit    Hyperlipidemia    Recheck labs in april      Relevant Orders   Hepatic function panel   Lipid panel   Microalbumin / creatinine urine ratio   POCT urinalysis dipstick    Other Visit Diagnoses    Diabetes mellitus type II, uncontrolled    -  Primary    Relevant Orders    Basic metabolic panel    Hemoglobin A1c    Microalbumin / creatinine urine ratio    POCT urinalysis dipstick        Garnet Koyanagi, DO

## 2014-12-06 NOTE — Telephone Encounter (Signed)
Rx re-faxed    KP 

## 2014-12-06 NOTE — Patient Instructions (Signed)
Diabetes and Standards of Medical Care Diabetes is complicated. You may find that your diabetes team includes a dietitian, nurse, diabetes educator, eye doctor, and more. To help everyone know what is going on and to help you get the care you deserve, the following schedule of care was developed to help keep you on track. Below are the tests, exams, vaccines, medicines, education, and plans you will need. HbA1c test This test shows how well you have controlled your glucose over the past 2-3 months. It is used to see if your diabetes management plan needs to be adjusted.   It is performed at least 2 times a year if you are meeting treatment goals.  It is performed 4 times a year if therapy has changed or if you are not meeting treatment goals. Blood pressure test  This test is performed at every routine medical visit. The goal is less than 140/90 mm Hg for most people, but 130/80 mm Hg in some cases. Ask your health care provider about your goal. Dental exam  Follow up with the dentist regularly. Eye exam  If you are diagnosed with type 1 diabetes as a child, get an exam upon reaching the age of 37 years or older and have had diabetes for 3-5 years. Yearly eye exams are recommended after that initial eye exam.  If you are diagnosed with type 1 diabetes as an adult, get an exam within 5 years of diagnosis and then yearly.  If you are diagnosed with type 2 diabetes, get an exam as soon as possible after the diagnosis and then yearly. Foot care exam  Visual foot exams are performed at every routine medical visit. The exams check for cuts, injuries, or other problems with the feet.  A comprehensive foot exam should be done yearly. This includes visual inspection as well as assessing foot pulses and testing for loss of sensation.  Check your feet nightly for cuts, injuries, or other problems with your feet. Tell your health care provider if anything is not healing. Kidney function test (urine  microalbumin)  This test is performed once a year.  Type 1 diabetes: The first test is performed 5 years after diagnosis.  Type 2 diabetes: The first test is performed at the time of diagnosis.  A serum creatinine and estimated glomerular filtration rate (eGFR) test is done once a year to assess the level of chronic kidney disease (CKD), if present. Lipid profile (cholesterol, HDL, LDL, triglycerides)  Performed every 5 years for most people.  The goal for LDL is less than 100 mg/dL. If you are at high risk, the goal is less than 70 mg/dL.  The goal for HDL is 40 mg/dL-50 mg/dL for men and 50 mg/dL-60 mg/dL for women. An HDL cholesterol of 60 mg/dL or higher gives some protection against heart disease.  The goal for triglycerides is less than 150 mg/dL. Influenza vaccine, pneumococcal vaccine, and hepatitis B vaccine  The influenza vaccine is recommended yearly.  It is recommended that people with diabetes who are over 24 years old get the pneumonia vaccine. In some cases, two separate shots may be given. Ask your health care provider if your pneumonia vaccination is up to date.  The hepatitis B vaccine is also recommended for adults with diabetes. Diabetes self-management education  Education is recommended at diagnosis and ongoing as needed. Treatment plan  Your treatment plan is reviewed at every medical visit. Document Released: 08/16/2009 Document Revised: 03/05/2014 Document Reviewed: 03/21/2013 [facility] Patient Information 2015 [place],  [employer]. This information is not intended to replace advice given to you by your health care provider. Make sure you discuss any questions you have with your health care provider.  

## 2014-12-25 DIAGNOSIS — H40023 Open angle with borderline findings, high risk, bilateral: Secondary | ICD-10-CM | POA: Diagnosis not present

## 2015-02-04 ENCOUNTER — Other Ambulatory Visit (INDEPENDENT_AMBULATORY_CARE_PROVIDER_SITE_OTHER): Payer: Medicare Other

## 2015-02-04 DIAGNOSIS — E785 Hyperlipidemia, unspecified: Secondary | ICD-10-CM

## 2015-02-04 DIAGNOSIS — IMO0002 Reserved for concepts with insufficient information to code with codable children: Secondary | ICD-10-CM

## 2015-02-04 DIAGNOSIS — E1165 Type 2 diabetes mellitus with hyperglycemia: Secondary | ICD-10-CM

## 2015-02-04 LAB — LIPID PANEL
Cholesterol: 220 mg/dL — ABNORMAL HIGH (ref 0–200)
HDL: 82 mg/dL (ref >39.00)
LDL Cholesterol: 120 mg/dL — ABNORMAL HIGH (ref 0–99)
NonHDL: 138
Total CHOL/HDL Ratio: 3
Triglycerides: 92 mg/dL (ref 0.0–149.0)
VLDL: 18.4 mg/dL (ref 0.0–40.0)

## 2015-02-04 LAB — BASIC METABOLIC PANEL
BUN: 21 mg/dL (ref 6–23)
CO2: 29 mEq/L (ref 19–32)
Calcium: 9.7 mg/dL (ref 8.4–10.5)
Chloride: 105 mEq/L (ref 96–112)
Creatinine, Ser: 0.96 mg/dL (ref 0.40–1.20)
GFR: 61.01 mL/min (ref >60.00)
Glucose, Bld: 104 mg/dL — ABNORMAL HIGH (ref 70–99)
Potassium: 4 mEq/L (ref 3.5–5.1)
Sodium: 139 mEq/L (ref 135–145)

## 2015-02-04 LAB — MICROALBUMIN / CREATININE URINE RATIO
Creatinine,U: 105.8 mg/dL
Microalb Creat Ratio: 0.7 mg/g (ref 0.0–30.0)
Microalb, Ur: <0.7 mg/dL (ref 0.0–1.9)

## 2015-02-04 LAB — HEPATIC FUNCTION PANEL
ALT: 13 U/L (ref 0–35)
AST: 18 U/L (ref 0–37)
Albumin: 4 g/dL (ref 3.5–5.2)
Alkaline Phosphatase: 47 U/L (ref 39–117)
Bilirubin, Direct: 0.1 mg/dL (ref 0.0–0.3)
Total Bilirubin: 0.6 mg/dL (ref 0.2–1.2)
Total Protein: 7.3 g/dL (ref 6.0–8.3)

## 2015-02-04 LAB — HEMOGLOBIN A1C: Hgb A1c MFr Bld: 6.9 % — ABNORMAL HIGH (ref 4.6–6.5)

## 2015-02-11 ENCOUNTER — Other Ambulatory Visit: Payer: Self-pay

## 2015-02-11 MED ORDER — SIMVASTATIN 20 MG PO TABS: 20.0000 mg | ORAL_TABLET | Freq: Every day | ORAL | Status: DC

## 2015-02-13 ENCOUNTER — Telehealth: Payer: Self-pay | Admitting: Family Medicine

## 2015-02-13 DIAGNOSIS — E785 Hyperlipidemia, unspecified: Secondary | ICD-10-CM

## 2015-02-13 NOTE — Telephone Encounter (Signed)
Caller name:Mirsky Mayling Relation to BT:DVVO Call back [place]:  Reason for call: pt would like for you to give her a call, states she has a question regarding her lab results on my chart

## 2015-02-13 NOTE — Telephone Encounter (Signed)
Discussed with patient and she stated that she advised you that she would not take as statin along with the Zetia, she wants to know which one you would like for her to take. She said she has changed her diet and is doing everything she could to get her cholesterol down and wanted to know if you recommend that she see a nutritionist. Please advise     KP

## 2015-02-13 NOTE — Telephone Encounter (Signed)
Will defer to PCP on this.

## 2015-02-14 NOTE — Telephone Encounter (Signed)
She is at increase d risk for MI and stroke----if genetic --- diet will only help so much.  Ok to refer to nutrition

## 2015-02-15 NOTE — Telephone Encounter (Signed)
The patient has been made aware adn she wanted to know since she will not be taking both which do you prefer Zetia or Zocor?

## 2015-02-18 NOTE — Telephone Encounter (Signed)
zocor  

## 2015-02-18 NOTE — Telephone Encounter (Signed)
Patient has been made aware and verbalized understanding.     KP

## 2015-02-25 ENCOUNTER — Encounter: Payer: Self-pay | Admitting: *Deleted

## 2015-02-25 ENCOUNTER — Encounter: Payer: Medicare Other | Attending: Family Medicine | Admitting: *Deleted

## 2015-02-25 DIAGNOSIS — Z713 Dietary counseling and surveillance: Secondary | ICD-10-CM | POA: Diagnosis not present

## 2015-02-25 DIAGNOSIS — E785 Hyperlipidemia, unspecified: Secondary | ICD-10-CM | POA: Diagnosis not present

## 2015-02-25 NOTE — Progress Notes (Signed)
  Medical Nutrition Therapy:  Appt start time: 1030 end time:  1130.   Assessment:  Primary concerns today: Pt referred for hyperlipidemia (Total cholesterol: 220, HDL 82, LDL 120)  Pt here with husband. Pt wants to know about foods she can eat to decrease her cholesterol. Pt states she becomes hypoglycemic in the afternoon/evenings. Pt and husband eats mostly at home they bake, broil, grill. Eats out a few times a week but will make healthy substitutions.  Pt frustrated because she eats very healthy despite the high cholesterol.  Pt and husband do not eat much bread.  Pt has limitations to foods she can eat due to esophogeal surgery, she get full very quickly and has to avoid certain dry foods that will stick.  Preferred Learning Style:   No preference indicated   Learning Readiness:   Change in progress   MEDICATIONS: see list   DIETARY INTAKE:  Usual eating pattern includes 2 meals and 1-2 snacks per day.  Everyday foods include grilled shrimp, salmon, and tuna fish.  Usually grill, baked very little fried, use olive oil when frying.  Eat cheese (sargents, brie, port wine cheese, shredded cheese)  Avoided foods include really sweet foods, french fries/fried foods.    24-hr recall:  B ( AM): 1/2 cup oatmeal w/ fruit and a boiled egg and coffee  Snk ( AM): none L ( PM): meat, vegetable, starch Snk ( PM): apple OR popcorn OR yogurt OR peanut butter and celery OR pretzels OR grapes D ( PM): snacks because of reflux Snk ( PM):  Beverages: water, coffee, un sweet tea  Eats out a couple times a week ([employer], [employer]era)  Usual physical activity: walk a little over a mile almost every day about 20-30 minutes.  Walked 4 miles/day and hand weights pre cancer  Estimated energy needs: 1600-1800 calories 180-200g carbohydrates 120-135 g protein 44-50 g fat  Progress Towards Goal(s):  In progress.   Nutritional Diagnosis:  NI-5.7.1 Inadequate protein intake As related to high  carbohydrate snacks with out pairing with protein .  As evidenced by reactive hypoglycemia and inadequate satitey .    Intervention:  Nutrition counseling provided.  Discussed increasing fiber and decreasing saturated fats.  Discussed adding in proteins with snacks to control blood sugars.  Talked about adding more physical activity, adding in weight training and balance.   -Pair carbohydrate snacks with a protein choice -Add an extra walk a day and do some weights -Choose low-fat cheeses and high fiber foods -Consider talking with doctor about a different diabetes medicine   Teaching Method Utilized:  Auditory  Handouts given during visit include:  1 5g Carb snack sheet  Barriers to learning/adherence to lifestyle change: none  Demonstrated degree of understanding via:  Teach Back   Monitoring/Evaluation:  Dietary intake, exercise, HbA1c, lipid profile and body weight prn.

## 2015-02-25 NOTE — Patient Instructions (Signed)
-  Pair carbohydrate snacks with a protein choice -Add an extra walk a day and do some weights -Choose low-fat cheeses and high fiber foods -Consider talking with doctor about a different diabetes medicine

## 2015-04-04 ENCOUNTER — Ambulatory Visit (INDEPENDENT_AMBULATORY_CARE_PROVIDER_SITE_OTHER): Payer: Medicare Other | Admitting: Medical

## 2015-04-04 ENCOUNTER — Encounter: Payer: Self-pay | Admitting: Medical

## 2015-04-04 VITALS — BP 152/90 | HR 86 | Temp 97.7°F | Ht 61.5 in | Wt 119.4 lb

## 2015-04-04 DIAGNOSIS — N390 Urinary tract infection, site not specified: Secondary | ICD-10-CM | POA: Diagnosis not present

## 2015-04-04 DIAGNOSIS — R82998 Other abnormal findings in urine: Secondary | ICD-10-CM

## 2015-04-04 DIAGNOSIS — N3001 Acute cystitis with hematuria: Secondary | ICD-10-CM

## 2015-04-04 LAB — POCT URINALYSIS DIPSTICK
Bilirubin, UA: NEGATIVE
Blood, UA: POSITIVE
Glucose, UA: NEGATIVE
Ketones, UA: NEGATIVE
Nitrite, UA: NEGATIVE
Protein, UA: 30
Spec Grav, UA: <=1.005
Urobilinogen, UA: 0.2
pH, UA: 5

## 2015-04-04 MED ORDER — PHENAZOPYRIDINE HCL 200 MG PO TABS: 200.0000 mg | ORAL_TABLET | Freq: Three times a day (TID) | ORAL | Status: DC | PRN

## 2015-04-04 MED ORDER — CIPROFLOXACIN HCL 500 MG PO TABS: 500.0000 mg | ORAL_TABLET | Freq: Two times a day (BID) | ORAL | Status: DC

## 2015-04-04 NOTE — Patient Instructions (Addendum)
UTI (urinary tract infection) Your appear to have a urinary tract infection. I am prescribing cipro  antibiotic for the probable infection. Hydrate well. I am sending out a urine culture. During the interim if your signs and symptoms worsen rather than improving please notify us. We will notify your when the culture results are back.  Follow up in 7 days or as needed.     Follow up in 7-10 days. I want to repeat urine to see if blood still present post uti tx. If still present on dip would get microscopy to confirm.  Depending on how she does may refer to urologist(if blood persisted post infection.) Note hx of smoking year back.

## 2015-04-04 NOTE — Assessment & Plan Note (Signed)
Your appear to have a urinary tract infection. I am prescribing cipro  antibiotic for the probable infection. Hydrate well. I am sending out a urine culture. During the interim if your signs and symptoms worsen rather than improving please notify us. We will notify your when the culture results are back.  Follow up in 7 days or as needed. 

## 2015-04-04 NOTE — Progress Notes (Signed)
Pre visit review using our clinic review tool, if applicable. No additional management support is needed unless otherwise documented below in the visit note. 

## 2015-04-04 NOTE — Progress Notes (Signed)
Subjective:    Patient ID: Briana Craig, female    DOB: July 07, 1946, 69 y.o.   MRN: [id number]  HPI  Pt in today reporting urinary symptoms.  Dysuria- this am. Frequent urination- today more frequently. Hesitancy-no Suprapubic pressure-yes today when woke. Fever-no chills-no Nausea-no Vomiting-no CVA pain-no History of UTI-4 uti in life.  Gross hematuria-yes per pt.   Hx of smoking many years ago. Stopped in 1985.    Review of Systems  Constitutional: Negative for fever, chills and fatigue.  Respiratory: Negative for cough, chest tightness, shortness of breath and wheezing.   Cardiovascular: Negative for chest pain and palpitations.  Genitourinary: Positive for dysuria, urgency and frequency. Negative for decreased urine volume.  Musculoskeletal: Negative for back pain.  Neurological: Negative for dizziness and headaches.  Hematological: Negative for adenopathy. Does not bruise/bleed easily.   Past Medical History  Diagnosis Date  . Hypertension   . Hyperlipemia   . GERD (gastroesophageal reflux disease)   . PONV (postoperative nausea and vomiting)   . Dysphagia   . Esophageal cancer   . Impaired fasting blood sugar     History   Social History  . Marital Status: Married    Spouse Name: Sonia Side  . Number of Children: 0  . Years of Education: 18   Occupational History  . RETIRED  Other   Social History Main Topics  . Smoking status: Former Smoker    Types: Cigarettes    Quit date: 08/30/1987  . Smokeless tobacco: Never Used  . Alcohol Use: No  . Drug Use: Not on file  . Sexual Activity:    Partners: Male    Birth Control/ Protection: Post-menopausal   Other Topics Concern  . Not on file   Social History Narrative   Marital Status:  Married Sonia Side)   Children:  None    Pets: Dog (1)    Living Situation: Lives with husband.   Occupation:  Retired Visual merchandiser)    Education: Master's Degree    Tobacco Use/Exposure:  Formal Smoker    Alcohol Use:   Occasional   Drug Use:  None   Diet:  Regular   Exercise:  Walking 1 mile 5 times per week.    Hobbies:  Reading                 Past Surgical History  Procedure Laterality Date  . Esophagus surgery    . Esophagogastroduodenoscopy  11/17/2011    Procedure: ESOPHAGOGASTRODUODENOSCOPY (EGD);  Surgeon: Owens Loffler, MD;  Location: [facility];  Service: Endoscopy;  Laterality: N/A;  . Eye surgery      both cataracts  . Direct laryngoscopy with botox injection      tx paralysed vocal cord  . Ganglion cyst excision Left 01/26/2013    Procedure: LEFT FLEXOR CARPI RADIALIS  RELEASE AND SCAPHO TRAPEZIAL TRAPEZOID DEBRIDEMENT ;  Surgeon: Tennis Must, MD;  Location: [facility];  Service: Orthopedics;  Laterality: Left;    Family History  Problem Relation Age of Onset  . Colon cancer Neg Hx   . Alcohol abuse Sister   . AAA (abdominal aortic aneurysm) Mother   . COPD Father     Allergies  Allergen Reactions  . Taxol [Paclitaxel] Hives and Itching  . Tramadol     "Sweaty, shaky and faint"  . Carboplatin Hives and Itching  . Codeine   . Compazine   . Compazine  [Prochlorperazine Maleate] Other (See Comments)    Severe chest pain  .  Penicillins     Current Outpatient Prescriptions on File Prior to Visit  Medication Sig Dispense Refill  . CALCIUM-VITAMIN D PO Take 600 mg by mouth daily.     . cetirizine (ZYRTEC) 10 MG tablet Take 10 mg by mouth as needed for allergies.    . famotidine (PEPCID) 20 MG tablet Take 1 tablet (20 mg total) by mouth 2 (two) times daily. 180 tablet 3  . fluticasone (FLONASE) 50 MCG/ACT nasal spray Place 2 sprays into the nose daily as needed.     Marland Kitchen glucose blood test strip Use as instructed--  Check glucose qd-- one touch ultra mini 100 each 12  . Multiple Vitamins-Minerals (MULTIVITAMIN PO) Take 1 tablet by mouth daily.     . Omega-3 Fatty Acids (FISH OIL PO) Take 1,200 mg by mouth daily.     . pantoprazole (PROTONIX) 40 MG tablet TAKE  1 TABLET TWICE A DAY 180 tablet 2  . simvastatin (ZOCOR) 20 MG tablet Take 1 tablet (20 mg total) by mouth at bedtime. 30 tablet 2   No current facility-administered medications on file prior to visit.    BP 152/90 mmHg  Pulse 86  Temp(Src) 97.7 F (36.5 C) (Oral)  Ht 5' 1.5" (1.562 m)  Wt 119 lb 6.4 oz (54.159 kg)  BMI 22.20 kg/m2  SpO2 99%       Objective:   Physical Exam   General  Mental Status- Alert. Orientation- Orientation x 4.   Skin General:- Normal. Moisture- Dry. Temperature- Warm.  HEENT Head- normal.  Neck Neck- Supple.  Heart Ausculation-RRR  Lungs Ausculation- Clear, even, unlabored bilaterlly.    Abdomen Palpation/Percussion: Palpation and Percussion of the abdomen reveal- suprapubic faint Tender, No Rebound tenderness, No Rigidity(guarding), No Palpable abdominal masses and No jar tenderness. No suprapubic tenderness. Liver:-Normal. Spleen:- Normal. Other Characteristics- No Costovertebral angle tenderness- Left or Costovertebral angle tenderness- Right.  Auscultation: Auscultation of the abdomen reveals- Bowel Sounds normal.  Back- no cva tenderness.     Assessment & Plan:

## 2015-04-05 LAB — URINE CULTURE
Colony Count: NO GROWTH
Organism ID, Bacteria: NO GROWTH

## 2015-04-08 ENCOUNTER — Encounter: Payer: Self-pay | Admitting: Family Medicine

## 2015-04-08 ENCOUNTER — Ambulatory Visit (INDEPENDENT_AMBULATORY_CARE_PROVIDER_SITE_OTHER): Payer: Medicare Other | Admitting: Family Medicine

## 2015-04-08 VITALS — BP 128/78 | HR 99 | Temp 98.2°F | Resp 18 | Ht 62.0 in | Wt 117.0 lb

## 2015-04-08 DIAGNOSIS — R300 Dysuria: Secondary | ICD-10-CM | POA: Diagnosis not present

## 2015-04-08 DIAGNOSIS — R319 Hematuria, unspecified: Secondary | ICD-10-CM | POA: Diagnosis not present

## 2015-04-08 LAB — POCT URINALYSIS DIPSTICK
Bilirubin, UA: NEGATIVE
Blood, UA: NEGATIVE
Glucose, UA: NEGATIVE
Ketones, UA: NEGATIVE
Leukocytes, UA: NEGATIVE
Nitrite, UA: NEGATIVE
Protein, UA: NEGATIVE
Spec Grav, UA: 1.015
Urobilinogen, UA: 2
pH, UA: 6

## 2015-04-08 NOTE — Progress Notes (Signed)
Patient ID: Briana Craig, female    DOB: 04-08-1946  Age: 69 y.o. MRN: [id number]    Subjective:  Subjective HPI CELISSE CIULLA presents for f/u UTI and hematuria.    Review of Systems  Constitutional: Negative for diaphoresis, appetite change, fatigue and unexpected weight change.  Eyes: Negative for pain, redness and visual disturbance.  Respiratory: Negative for cough, chest tightness, shortness of breath and wheezing.   Cardiovascular: Negative for chest pain, palpitations and leg swelling.  Endocrine: Negative for cold intolerance, heat intolerance, polydipsia, polyphagia and polyuria.  Genitourinary: Negative for dysuria, frequency, hematuria, decreased urine volume and difficulty urinating.  Neurological: Negative for dizziness, light-headedness, numbness and headaches.    History Past Medical History  Diagnosis Date  . Hypertension   . Hyperlipemia   . GERD (gastroesophageal reflux disease)   . PONV (postoperative nausea and vomiting)   . Dysphagia   . Esophageal cancer   . Impaired fasting blood sugar     She has past surgical history that includes Esophagus surgery; Esophagogastroduodenoscopy (11/17/2011); Eye surgery; Direct laryngoscopy with botox injection; and Ganglion cyst excision (Left, 01/26/2013).   Her family history includes AAA (abdominal aortic aneurysm) in her mother; Alcohol abuse in her sister; COPD in her father. There is no history of Colon cancer.She reports that she quit smoking about 27 years ago. Her smoking use included Cigarettes. She has never used smokeless tobacco. She reports that she does not drink alcohol. Her drug history is not on file.  Current Outpatient Prescriptions on File Prior to Visit  Medication Sig Dispense Refill  . CALCIUM-VITAMIN D PO Take 600 mg by mouth daily.     . cetirizine (ZYRTEC) 10 MG tablet Take 10 mg by mouth as needed for allergies.    . ciprofloxacin (CIPRO) 500 MG tablet Take 1 tablet (500 mg total) by mouth 2  (two) times daily. 14 tablet 0  . famotidine (PEPCID) 20 MG tablet Take 1 tablet (20 mg total) by mouth 2 (two) times daily. 180 tablet 3  . fluticasone (FLONASE) 50 MCG/ACT nasal spray Place 2 sprays into the nose daily as needed.     Marland Kitchen glucose blood test strip Use as instructed--  Check glucose qd-- one touch ultra mini 100 each 12  . Multiple Vitamins-Minerals (MULTIVITAMIN PO) Take 1 tablet by mouth daily.     . Omega-3 Fatty Acids (FISH OIL PO) Take 1,200 mg by mouth daily.     . pantoprazole (PROTONIX) 40 MG tablet TAKE 1 TABLET TWICE A DAY 180 tablet 2  . simvastatin (ZOCOR) 20 MG tablet Take 1 tablet (20 mg total) by mouth at bedtime. 30 tablet 2   No current facility-administered medications on file prior to visit.     Objective:  Objective Physical Exam  Constitutional: She is oriented to person, place, and time. She appears well-developed and well-nourished.  Abdominal: Soft. She exhibits no distension. There is no tenderness. There is no rebound, no guarding and no CVA tenderness.  Genitourinary: Pelvic exam was performed with patient supine. There is no rash, tenderness, lesion or injury on the right labia. There is no rash, tenderness, lesion or injury on the left labia. No erythema or tenderness in the vagina. No vaginal discharge found.  Neurological: She is alert and oriented to person, place, and time.  Psychiatric: She has a normal mood and affect. Her behavior is normal. Judgment and thought content normal.   BP 128/78 mmHg  Pulse 99  Temp(Src) 98.2 F (36.8 C) (  Oral)  Resp 18  Ht 5\' 2"  (1.575 m)  Wt 117 lb (53.071 kg)  BMI 21.39 kg/m2  SpO2 96% Wt Readings from Last 3 Encounters:  04/08/15 117 lb (53.071 kg)  04/04/15 119 lb 6.4 oz (54.159 kg)  12/06/14 119 lb 3.2 oz (54.069 kg)     Lab Results  Component Value Date   WBC 5.4 11/13/2014   HGB 12.9 11/13/2014   HCT 39.2 11/13/2014   PLT 233.0 11/13/2014   GLUCOSE 104* 02/04/2015   CHOL 220* 02/04/2015     TRIG 92.0 02/04/2015   HDL 82.00 02/04/2015   LDLCALC 120* 02/04/2015   ALT 13 02/04/2015   AST 18 02/04/2015   NA 139 02/04/2015   K 4.0 02/04/2015   CL 105 02/04/2015   CREATININE 0.96 02/04/2015   BUN 21 02/04/2015   CO2 29 02/04/2015   TSH 2.147 04/24/2013   HGBA1C 6.9* 02/04/2015   MICROALBUR <0.7 02/04/2015    No results found.   Assessment & Plan:  Plan I have discontinued Ms. Diego's phenazopyridine. I am also having her maintain her CALCIUM-VITAMIN D PO, Multiple Vitamins-Minerals (MULTIVITAMIN PO), Omega-3 Fatty Acids (FISH OIL PO), cetirizine, fluticasone, pantoprazole, famotidine, glucose blood, simvastatin, and ciprofloxacin.  No orders of the defined types were placed in this encounter.    Problem List Items Addressed This Visit    Hematuria - Primary    Resolved Urine sent for culture If returns -- may need urology referral      Relevant Orders   POCT urinalysis dipstick (Completed)   Dysuria   Relevant Orders   POCT urinalysis dipstick (Completed)      Follow-up: Return if symptoms worsen or fail to improve.  Garnet Koyanagi, DO

## 2015-04-08 NOTE — Patient Instructions (Signed)

## 2015-04-08 NOTE — Assessment & Plan Note (Signed)
Resolved Urine sent for culture If returns -- may need urology referral

## 2015-04-10 ENCOUNTER — Encounter: Payer: Self-pay | Admitting: Family Medicine

## 2015-05-10 ENCOUNTER — Other Ambulatory Visit: Payer: Self-pay | Admitting: Family Medicine

## 2015-05-13 ENCOUNTER — Ambulatory Visit: Payer: BLUE CROSS/BLUE SHIELD | Admitting: Family Medicine

## 2015-05-13 ENCOUNTER — Telehealth: Payer: Self-pay | Admitting: *Deleted

## 2015-05-13 NOTE — Telephone Encounter (Signed)
"  I need a refill on Pantoprazole I get refilled by Dr. Benay Spice.  My PCP will not refill until I say I'm no longer under Dr. Gearldine Shown care.  This needs to go to [facility].  Please call me at [phone] so I'll know it was sent."   Next F/U 09-24-2015.

## 2015-05-13 NOTE — Telephone Encounter (Signed)
ok 

## 2015-05-14 ENCOUNTER — Other Ambulatory Visit: Payer: Self-pay | Admitting: *Deleted

## 2015-05-14 DIAGNOSIS — C159 Malignant neoplasm of esophagus, unspecified: Secondary | ICD-10-CM

## 2015-05-14 MED ORDER — PANTOPRAZOLE SODIUM 40 MG PO TBEC: 40.0000 mg | DELAYED_RELEASE_TABLET | Freq: Two times a day (BID) | ORAL | Status: DC

## 2015-06-10 DIAGNOSIS — R300 Dysuria: Secondary | ICD-10-CM | POA: Diagnosis not present

## 2015-06-10 DIAGNOSIS — R312 Other microscopic hematuria: Secondary | ICD-10-CM | POA: Diagnosis not present

## 2015-06-26 DIAGNOSIS — H40023 Open angle with borderline findings, high risk, bilateral: Secondary | ICD-10-CM | POA: Diagnosis not present

## 2015-08-08 ENCOUNTER — Other Ambulatory Visit: Payer: Self-pay | Admitting: Family Medicine

## 2015-08-13 DIAGNOSIS — Z23 Encounter for immunization: Secondary | ICD-10-CM | POA: Diagnosis not present

## 2015-09-08 ENCOUNTER — Other Ambulatory Visit: Payer: Self-pay | Admitting: Family Medicine

## 2015-09-24 ENCOUNTER — Ambulatory Visit (HOSPITAL_BASED_OUTPATIENT_CLINIC_OR_DEPARTMENT_OTHER): Payer: Medicare Other | Admitting: Oncology

## 2015-09-24 VITALS — BP 144/81 | HR 73 | Temp 98.2°F | Resp 16 | Ht 62.0 in | Wt 118.4 lb

## 2015-09-24 DIAGNOSIS — C159 Malignant neoplasm of esophagus, unspecified: Secondary | ICD-10-CM | POA: Diagnosis not present

## 2015-09-24 NOTE — Progress Notes (Signed)
[place] OFFICE PROGRESS NOTE   Diagnosis: Esophagus cancer  INTERVAL HISTORY:   Briana Craig returns as scheduled. She saw Dr. Elenor Quinones in December 2015. A CT revealed scattered pulmonary nodules, not significant change, felt to be secondary to chronic aspiration or prior infection. She is scheduled for a follow-up appointment at [facility] next month. She feels well. Good appetite and energy level. No dysphagia. She has reflux symptoms on we went supine. She sleeps on extra pillows and this helps.  She reports being up-to-date on the influenza and pneumonia vaccines.  Objective:  Vital signs in last 24 hours:  Blood pressure 144/81, pulse 73, temperature 98.2 F (36.8 C), temperature source Oral, resp. rate 16, height 5\' 2"  (1.575 m), weight 118 lb 6.4 oz (53.706 kg), SpO2 100 %.    HEENT: Neck without mass Lymphatics: No cervical, supra-clavicular, or axillary nodes Resp: Lungs with coarse rhonchi at the posterior basis, no respiratory distress Cardio: Regular rate and rhythm GI: No hepatomegaly, no mass, nontender Vascular: No leg edema   Lab Results:  Lab Results  Component Value Date   WBC 5.4 11/13/2014   HGB 12.9 11/13/2014   HCT 39.2 11/13/2014   MCV 89.6 11/13/2014   PLT 233.0 11/13/2014   NEUTROABS 2.7 11/13/2014     Medications: I have reviewed the patient's current medications.  Assessment/Plan: 1. 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 A staging CT and PET scan revealed no evidence for distant metastatic disease. An endoscopic ultrasound on 01/30/2011 confirmed a uT3, uN1 tumor. She began radiation and concurrent weekly Taxol/carboplatin chemotherapy on 02/16/2011. She completed the course of radiation on 03/25/2011. The last chemotherapy was given 03/16/2011. She is status post an esophagogastrectomy on 05/14/2011 by Dr. Elenor Quinones at [facility]. Per Dr. Florentina Jenny office note dated 07/02/2011, pathology showed a 0.7 cm  adenocarcinoma with signet ring cell features, high grade. All lymph nodes negative and margins negative (T3 N0). Restaging CT 10/20/2011 at [facility] showed no evidence of recurrent esophagus cancer. CT at [facility] on 04/07/2012 revealed evidence of aspiration/inflammation with a 0.9 cm nodular opacity in the lingula. Restaging chest CT at [facility] on 10/06/2012 showed no evidence of locally recurrent or metastatic disease. Restaging CT at [facility] on 10/18/2014 without evidence of recurrent esophagus cancer.  Chest CT at [facility] 04/26/2014 with stable treated in bud opacities, 2 new nodules 2. History of iron-deficiency anemia secondary to gastrointestinal blood loss from the esophagus cancer. 3. History of solid dysphagia, improved following neoadjuvant therapy.  4. Odynophagia secondary to the esophagus tumor, improved following neoadjuvant therapy. 5. History of hypertension. 6. Right ovarian cyst. 7. Skin rash following chemotherapy with week number 4 and week number 5 of Taxol/carboplatin. We felt the skin rash was most likely related to carboplatin though the rash could have also been due to Taxol. Both Taxol and carboplatin are now listed as allergies. 8. History of thrombocytopenia secondary to chemotherapy and radiation. 9. History of subcutaneous emphysema at the upper chest bilaterally following esophagogastrectomy. 10. Left vocal cord paralysis and hoarseness status post radius vocal fold injections by Dr. Redmond Baseman 08/17/2011. Improved. 11. CT 10/20/2011 at [facility] with pulmonary parenchymal opacities concerning for aspiration. Increased on a [facility] CT 04/07/2012, stable 04/20/2013 12. EGD on 11/17/2011. The GE anastomosis very proximal; significant amount of stomach in the chest; visible sutures, staples at the rim like anastomosis. 13. GERD. She continues Protonix and Pepcid.    Disposition:  [name] remains in clinical remission from esophagus cancer. She will return for  an office visit in one year. She  is scheduled to see Dr. Elenor Quinones with a restaging CT next month.  Briana Coder, MD  09/24/2015  3:40 PM

## 2015-10-07 ENCOUNTER — Telehealth: Payer: Self-pay | Admitting: Oncology

## 2015-10-07 NOTE — Telephone Encounter (Signed)
apointments completed by AR per 11/22 pof. Left message for patient confirming appointment and mailed schedule.

## 2015-10-17 DIAGNOSIS — Z9049 Acquired absence of other specified parts of digestive tract: Secondary | ICD-10-CM | POA: Diagnosis not present

## 2015-10-17 DIAGNOSIS — C159 Malignant neoplasm of esophagus, unspecified: Secondary | ICD-10-CM | POA: Diagnosis not present

## 2015-10-17 DIAGNOSIS — K769 Liver disease, unspecified: Secondary | ICD-10-CM | POA: Diagnosis not present

## 2015-10-17 DIAGNOSIS — Z8501 Personal history of malignant neoplasm of esophagus: Secondary | ICD-10-CM | POA: Diagnosis not present

## 2015-10-17 DIAGNOSIS — Z08 Encounter for follow-up examination after completed treatment for malignant neoplasm: Secondary | ICD-10-CM | POA: Diagnosis not present

## 2015-10-17 DIAGNOSIS — Z85118 Personal history of other malignant neoplasm of bronchus and lung: Secondary | ICD-10-CM | POA: Diagnosis not present

## 2015-10-17 DIAGNOSIS — K432 Incisional hernia without obstruction or gangrene: Secondary | ICD-10-CM | POA: Diagnosis not present

## 2015-10-17 DIAGNOSIS — R918 Other nonspecific abnormal finding of lung field: Secondary | ICD-10-CM | POA: Diagnosis not present

## 2015-11-10 ENCOUNTER — Other Ambulatory Visit: Payer: Self-pay | Admitting: Family Medicine

## 2015-11-10 DIAGNOSIS — E785 Hyperlipidemia, unspecified: Secondary | ICD-10-CM

## 2015-11-10 DIAGNOSIS — E119 Type 2 diabetes mellitus without complications: Secondary | ICD-10-CM

## 2015-11-11 ENCOUNTER — Encounter: Payer: Medicare Other | Admitting: Family Medicine

## 2015-11-11 NOTE — Telephone Encounter (Signed)
Please schedule a follow up apt/ Fasting and forward back to me.      KP

## 2015-11-12 NOTE — Telephone Encounter (Signed)
Jan 23 at 11:30   KP

## 2015-11-12 NOTE — Telephone Encounter (Signed)
Patient would like physical due to her physical being canceled due to the snow may I use a hospital follow up slot ?

## 2015-11-12 NOTE — Telephone Encounter (Signed)
Patient scheduled for physical for 11/25/15 at 11:30am and stated she has only 4 pills left (simvastatin)

## 2015-11-25 ENCOUNTER — Other Ambulatory Visit: Payer: Self-pay | Admitting: Family Medicine

## 2015-11-25 ENCOUNTER — Encounter: Payer: Self-pay | Admitting: Family Medicine

## 2015-11-25 ENCOUNTER — Ambulatory Visit (INDEPENDENT_AMBULATORY_CARE_PROVIDER_SITE_OTHER): Payer: Medicare Other | Admitting: Family Medicine

## 2015-11-25 ENCOUNTER — Other Ambulatory Visit (INDEPENDENT_AMBULATORY_CARE_PROVIDER_SITE_OTHER): Payer: Medicare Other

## 2015-11-25 VITALS — BP 122/82 | HR 78 | Temp 97.8°F | Ht 62.0 in | Wt 117.6 lb

## 2015-11-25 DIAGNOSIS — E1151 Type 2 diabetes mellitus with diabetic peripheral angiopathy without gangrene: Secondary | ICD-10-CM

## 2015-11-25 DIAGNOSIS — Z1159 Encounter for screening for other viral diseases: Secondary | ICD-10-CM

## 2015-11-25 DIAGNOSIS — I10 Essential (primary) hypertension: Secondary | ICD-10-CM | POA: Diagnosis not present

## 2015-11-25 DIAGNOSIS — Z0000 Encounter for general adult medical examination without abnormal findings: Secondary | ICD-10-CM

## 2015-11-25 DIAGNOSIS — C159 Malignant neoplasm of esophagus, unspecified: Secondary | ICD-10-CM

## 2015-11-25 DIAGNOSIS — E119 Type 2 diabetes mellitus without complications: Secondary | ICD-10-CM | POA: Diagnosis not present

## 2015-11-25 DIAGNOSIS — E2839 Other primary ovarian failure: Secondary | ICD-10-CM

## 2015-11-25 DIAGNOSIS — E785 Hyperlipidemia, unspecified: Secondary | ICD-10-CM | POA: Diagnosis not present

## 2015-11-25 LAB — COMPREHENSIVE METABOLIC PANEL
ALT: 17 U/L (ref 0–35)
AST: 22 U/L (ref 0–37)
Albumin: 4.4 g/dL (ref 3.5–5.2)
Alkaline Phosphatase: 52 U/L (ref 39–117)
BUN: 22 mg/dL (ref 6–23)
CO2: 29 mEq/L (ref 19–32)
Calcium: 9.6 mg/dL (ref 8.4–10.5)
Chloride: 101 mEq/L (ref 96–112)
Creatinine, Ser: 1.02 mg/dL (ref 0.40–1.20)
GFR: 56.76 mL/min — ABNORMAL LOW (ref >60.00)
Glucose, Bld: 119 mg/dL — ABNORMAL HIGH (ref 70–99)
Potassium: 4.1 mEq/L (ref 3.5–5.1)
Sodium: 138 mEq/L (ref 135–145)
Total Bilirubin: 0.7 mg/dL (ref 0.2–1.2)
Total Protein: 7.6 g/dL (ref 6.0–8.3)

## 2015-11-25 LAB — CBC WITH DIFFERENTIAL/PLATELET
Basophils Absolute: 0 10*3/uL (ref 0.0–0.1)
Basophils Relative: 0.4 % (ref 0.0–3.0)
Eosinophils Absolute: 0.1 10*3/uL (ref 0.0–0.7)
Eosinophils Relative: 1.3 % (ref 0.0–5.0)
HCT: 40 % (ref 36.0–46.0)
Hemoglobin: 13.1 g/dL (ref 12.0–15.0)
Lymphocytes Relative: 20.1 % (ref 12.0–46.0)
Lymphs Abs: 1.2 10*3/uL (ref 0.7–4.0)
MCHC: 32.8 g/dL (ref 30.0–36.0)
MCV: 90.9 fl (ref 78.0–100.0)
Monocytes Absolute: 0.6 10*3/uL (ref 0.1–1.0)
Monocytes Relative: 10.7 % (ref 3.0–12.0)
Neutro Abs: 4 10*3/uL (ref 1.4–7.7)
Neutrophils Relative %: 67.5 % (ref 43.0–77.0)
Platelets: 239 10*3/uL (ref 150.0–400.0)
RBC: 4.4 Mil/uL (ref 3.87–5.11)
RDW: 15 % (ref 11.5–15.5)
WBC: 5.9 10*3/uL (ref 4.0–10.5)

## 2015-11-25 LAB — HEMOGLOBIN A1C: Hgb A1c MFr Bld: 6.7 % — ABNORMAL HIGH (ref 4.6–6.5)

## 2015-11-25 LAB — LIPID PANEL
Cholesterol: 204 mg/dL — ABNORMAL HIGH (ref 0–200)
HDL: 98.4 mg/dL (ref >39.00)
LDL Cholesterol: 92 mg/dL (ref 0–99)
NonHDL: 105.49
Total CHOL/HDL Ratio: 2
Triglycerides: 66 mg/dL (ref 0.0–149.0)
VLDL: 13.2 mg/dL (ref 0.0–40.0)

## 2015-11-25 LAB — MICROALBUMIN / CREATININE URINE RATIO
Creatinine,U: 20 mg/dL
Microalb Creat Ratio: 3.5 mg/g (ref 0.0–30.0)
Microalb, Ur: <0.7 mg/dL (ref 0.0–1.9)

## 2015-11-25 MED ORDER — SIMVASTATIN 20 MG PO TABS: ORAL_TABLET | ORAL | Status: DC

## 2015-11-25 NOTE — Assessment & Plan Note (Signed)
Per onc and GI

## 2015-11-25 NOTE — Progress Notes (Signed)
Pre visit review using our clinic review tool, if applicable. No additional management support is needed unless otherwise documented below in the visit note. 

## 2015-11-25 NOTE — Patient Instructions (Signed)
Preventive Care for Adults, Female A healthy lifestyle and preventive care can promote health and wellness. Preventive health guidelines for women include the following key practices.  A routine yearly physical is a good way to check with your health care provider about your health and preventive screening. It is a chance to share any concerns and updates on your health and to receive a thorough exam.  Visit your dentist for a routine exam and preventive care every 6 months. Brush your teeth twice a day and floss once a day. Good oral hygiene prevents tooth decay and gum disease.  The frequency of eye exams is based on your age, health, family medical history, use of contact lenses, and other factors. Follow your health care provider's recommendations for frequency of eye exams.  Eat a healthy diet. Foods like vegetables, fruits, whole grains, low-fat dairy products, and lean protein foods contain the nutrients you need without too many calories. Decrease your intake of foods high in solid fats, added sugars, and salt. Eat the right amount of calories for you.Get information about a proper diet from your health care provider, if necessary.  Regular physical exercise is one of the most important things you can do for your health. Most adults should get at least 150 minutes of moderate-intensity exercise (any activity that increases your heart rate and causes you to sweat) each week. In addition, most adults need muscle-strengthening exercises on 2 or more days a week.  Maintain a healthy weight. The body mass index (BMI) is a screening tool to identify possible weight problems. It provides an estimate of body fat based on height and weight. Your health care provider can find your BMI and can help you achieve or maintain a healthy weight.For adults 20 years and older:  A BMI below 18.5 is considered underweight.  A BMI of 18.5 to 24.9 is normal.  A BMI of 25 to 29.9 is considered overweight.  A  BMI of 30 and above is considered obese.  Maintain normal blood lipids and cholesterol levels by exercising and minimizing your intake of saturated fat. Eat a balanced diet with plenty of fruit and vegetables. Blood tests for lipids and cholesterol should begin at age 45 and be repeated every 5 years. If your lipid or cholesterol levels are high, you are over 50, or you are at high risk for heart disease, you may need your cholesterol levels checked more frequently.Ongoing high lipid and cholesterol levels should be treated with medicines if diet and exercise are not working.  If you smoke, find out from your health care provider how to quit. If you do not use tobacco, do not start.  Lung cancer screening is recommended for adults aged 45-80 years who are at high risk for developing lung cancer because of a history of smoking. A yearly low-dose CT scan of the lungs is recommended for people who have at least a 30-pack-year history of smoking and are a current smoker or have quit within the past 15 years. A pack year of smoking is smoking an average of 1 pack of cigarettes a day for 1 year (for example: 1 pack a day for 30 years or 2 packs a day for 15 years). Yearly screening should continue until the smoker has stopped smoking for at least 15 years. Yearly screening should be stopped for people who develop a health problem that would prevent them from having lung cancer treatment.  If you are pregnant, do not drink alcohol. If you are  breastfeeding, be very cautious about drinking alcohol. If you are not pregnant and choose to drink alcohol, do not have more than 1 drink per day. One drink is considered to be 12 ounces (355 mL) of beer, 5 ounces (148 mL) of wine, or 1.5 ounces (44 mL) of liquor.  Avoid use of street drugs. Do not share needles with anyone. Ask for help if you need support or instructions about stopping the use of drugs.  High blood pressure causes heart disease and increases the risk  of stroke. Your blood pressure should be checked at least every 1 to 2 years. Ongoing high blood pressure should be treated with medicines if weight loss and exercise do not work.  If you are 55-79 years old, ask your health care provider if you should take aspirin to prevent strokes.  Diabetes screening is done by taking a blood sample to check your blood glucose level after you have not eaten for a certain period of time (fasting). If you are not overweight and you do not have risk factors for diabetes, you should be screened once every 3 years starting at age 45. If you are overweight or obese and you are 40-70 years of age, you should be screened for diabetes every year as part of your cardiovascular risk assessment.  Breast cancer screening is essential preventive care for women. You should practice "breast self-awareness." This means understanding the normal appearance and feel of your breasts and may include breast self-examination. Any changes detected, no matter how small, should be reported to a health care provider. Women in their 20s and 30s should have a clinical breast exam (CBE) by a health care provider as part of a regular health exam every 1 to 3 years. After age 40, women should have a CBE every year. Starting at age 40, women should consider having a mammogram (breast X-ray test) every year. Women who have a family history of breast cancer should talk to their health care provider about genetic screening. Women at a high risk of breast cancer should talk to their health care providers about having an MRI and a mammogram every year.  Breast cancer gene (BRCA)-related cancer risk assessment is recommended for women who have family members with BRCA-related cancers. BRCA-related cancers include breast, ovarian, tubal, and peritoneal cancers. Having family members with these cancers may be associated with an increased risk for harmful changes (mutations) in the breast cancer genes BRCA1 and  BRCA2. Results of the assessment will determine the need for genetic counseling and BRCA1 and BRCA2 testing.  Your health care provider may recommend that you be screened regularly for cancer of the pelvic organs (ovaries, uterus, and vagina). This screening involves a pelvic examination, including checking for microscopic changes to the surface of your cervix (Pap test). You may be encouraged to have this screening done every 3 years, beginning at age 21.  For women ages 30-65, health care providers may recommend pelvic exams and Pap testing every 3 years, or they may recommend the Pap and pelvic exam, combined with testing for human papilloma virus (HPV), every 5 years. Some types of HPV increase your risk of cervical cancer. Testing for HPV may also be done on women of any age with unclear Pap test results.  Other health care providers may not recommend any screening for nonpregnant women who are considered low risk for pelvic cancer and who do not have symptoms. Ask your health care provider if a screening pelvic exam is right for   you.  If you have had past treatment for cervical cancer or a condition that could lead to cancer, you need Pap tests and screening for cancer for at least 20 years after your treatment. If Pap tests have been discontinued, your risk factors (such as having a new sexual partner) need to be reassessed to determine if screening should resume. Some women have medical problems that increase the chance of getting cervical cancer. In these cases, your health care provider may recommend more frequent screening and Pap tests.  Colorectal cancer can be detected and often prevented. Most routine colorectal cancer screening begins at the age of 50 years and continues through age 75 years. However, your health care provider may recommend screening at an earlier age if you have risk factors for colon cancer. On a yearly basis, your health care provider may provide home test kits to check  for hidden blood in the stool. Use of a small camera at the end of a tube, to directly examine the colon (sigmoidoscopy or colonoscopy), can detect the earliest forms of colorectal cancer. Talk to your health care provider about this at age 50, when routine screening begins. Direct exam of the colon should be repeated every 5-10 years through age 75 years, unless early forms of precancerous polyps or small growths are found.  People who are at an increased risk for hepatitis B should be screened for this virus. You are considered at high risk for hepatitis B if:  You were born in a country where hepatitis B occurs often. Talk with your health care provider about which countries are considered high risk.  Your parents were born in a high-risk country and you have not received a shot to protect against hepatitis B (hepatitis B vaccine).  You have HIV or AIDS.  You use needles to inject street drugs.  You live with, or have sex with, someone who has hepatitis B.  You get hemodialysis treatment.  You take certain medicines for conditions like cancer, organ transplantation, and autoimmune conditions.  Hepatitis C blood testing is recommended for all people born from 1945 through 1965 and any individual with known risks for hepatitis C.  Practice safe sex. Use condoms and avoid high-risk sexual practices to reduce the spread of sexually transmitted infections (STIs). STIs include gonorrhea, chlamydia, syphilis, trichomonas, herpes, HPV, and human immunodeficiency virus (HIV). Herpes, HIV, and HPV are viral illnesses that have no cure. They can result in disability, cancer, and death.  You should be screened for sexually transmitted illnesses (STIs) including gonorrhea and chlamydia if:  You are sexually active and are younger than 24 years.  You are older than 24 years and your health care provider tells you that you are at risk for this type of infection.  Your sexual activity has changed  since you were last screened and you are at an increased risk for chlamydia or gonorrhea. Ask your health care provider if you are at risk.  If you are at risk of being infected with HIV, it is recommended that you take a prescription medicine daily to prevent HIV infection. This is called preexposure prophylaxis (PrEP). You are considered at risk if:  You are sexually active and do not regularly use condoms or know the HIV status of your partner(s).  You take drugs by injection.  You are sexually active with a partner who has HIV.  Talk with your health care provider about whether you are at high risk of being infected with HIV. If   you choose to begin PrEP, you should first be tested for HIV. You should then be tested every 3 months for as long as you are taking PrEP.  Osteoporosis is a disease in which the bones lose minerals and strength with aging. This can result in serious bone fractures or breaks. The risk of osteoporosis can be identified using a bone density scan. Women ages 67 years and over and women at risk for fractures or osteoporosis should discuss screening with their health care providers. Ask your health care provider whether you should take a calcium supplement or vitamin D to reduce the rate of osteoporosis.  Menopause can be associated with physical symptoms and risks. Hormone replacement therapy is available to decrease symptoms and risks. You should talk to your health care provider about whether hormone replacement therapy is right for you.  Use sunscreen. Apply sunscreen liberally and repeatedly throughout the day. You should seek shade when your shadow is shorter than you. Protect yourself by wearing long sleeves, pants, a wide-brimmed hat, and sunglasses year round, whenever you are outdoors.  Once a month, do a whole body skin exam, using a mirror to look at the skin on your back. Tell your health care provider of new moles, moles that have irregular borders, moles that  are larger than a pencil eraser, or moles that have changed in shape or color.  Stay current with required vaccines (immunizations).  Influenza vaccine. All adults should be immunized every year.  Tetanus, diphtheria, and acellular pertussis (Td, Tdap) vaccine. Pregnant women should receive 1 dose of Tdap vaccine during each pregnancy. The dose should be obtained regardless of the length of time since the last dose. Immunization is preferred during the 27th-36th week of gestation. An adult who has not previously received Tdap or who does not know her vaccine status should receive 1 dose of Tdap. This initial dose should be followed by tetanus and diphtheria toxoids (Td) booster doses every 10 years. Adults with an unknown or incomplete history of completing a 3-dose immunization series with Td-containing vaccines should begin or complete a primary immunization series including a Tdap dose. Adults should receive a Td booster every 10 years.  Varicella vaccine. An adult without evidence of immunity to varicella should receive 2 doses or a second dose if she has previously received 1 dose. Pregnant females who do not have evidence of immunity should receive the first dose after pregnancy. This first dose should be obtained before leaving the health care facility. The second dose should be obtained 4-8 weeks after the first dose.  Human papillomavirus (HPV) vaccine. Females aged 13-26 years who have not received the vaccine previously should obtain the 3-dose series. The vaccine is not recommended for use in pregnant females. However, pregnancy testing is not needed before receiving a dose. If a female is found to be pregnant after receiving a dose, no treatment is needed. In that case, the remaining doses should be delayed until after the pregnancy. Immunization is recommended for any person with an immunocompromised condition through the age of 61 years if she did not get any or all doses earlier. During the  3-dose series, the second dose should be obtained 4-8 weeks after the first dose. The third dose should be obtained 24 weeks after the first dose and 16 weeks after the second dose.  Zoster vaccine. One dose is recommended for adults aged 30 years or older unless certain conditions are present.  Measles, mumps, and rubella (MMR) vaccine. Adults born  before 1957 generally are considered immune to measles and mumps. Adults born in 1957 or later should have 1 or more doses of MMR vaccine unless there is a contraindication to the vaccine or there is laboratory evidence of immunity to each of the three diseases. A routine second dose of MMR vaccine should be obtained at least 28 days after the first dose for students attending postsecondary schools, health care workers, or international travelers. People who received inactivated measles vaccine or an unknown type of measles vaccine during 1963-1967 should receive 2 doses of MMR vaccine. People who received inactivated mumps vaccine or an unknown type of mumps vaccine before 1979 and are at high risk for mumps infection should consider immunization with 2 doses of MMR vaccine. For females of childbearing age, rubella immunity should be determined. If there is no evidence of immunity, females who are not pregnant should be vaccinated. If there is no evidence of immunity, females who are pregnant should delay immunization until after pregnancy. Unvaccinated health care workers born before 1957 who lack laboratory evidence of measles, mumps, or rubella immunity or laboratory confirmation of disease should consider measles and mumps immunization with 2 doses of MMR vaccine or rubella immunization with 1 dose of MMR vaccine.  Pneumococcal 13-valent conjugate (PCV13) vaccine. When indicated, a person who is uncertain of his immunization history and has no record of immunization should receive the PCV13 vaccine. All adults 65 years of age and older should receive this  vaccine. An adult aged 19 years or older who has certain medical conditions and has not been previously immunized should receive 1 dose of PCV13 vaccine. This PCV13 should be followed with a dose of pneumococcal polysaccharide (PPSV23) vaccine. Adults who are at high risk for pneumococcal disease should obtain the PPSV23 vaccine at least 8 weeks after the dose of PCV13 vaccine. Adults older than 72 years of age who have normal immune system function should obtain the PPSV23 vaccine dose at least 1 year after the dose of PCV13 vaccine.  Pneumococcal polysaccharide (PPSV23) vaccine. When PCV13 is also indicated, PCV13 should be obtained first. All adults aged 65 years and older should be immunized. An adult younger than age 65 years who has certain medical conditions should be immunized. Any person who resides in a nursing home or long-term care facility should be immunized. An adult smoker should be immunized. People with an immunocompromised condition and certain other conditions should receive both PCV13 and PPSV23 vaccines. People with human immunodeficiency virus (HIV) infection should be immunized as soon as possible after diagnosis. Immunization during chemotherapy or radiation therapy should be avoided. Routine use of PPSV23 vaccine is not recommended for American Indians, [place] Natives, or people younger than 65 years unless there are medical conditions that require PPSV23 vaccine. When indicated, people who have unknown immunization and have no record of immunization should receive PPSV23 vaccine. One-time revaccination 5 years after the first dose of PPSV23 is recommended for people aged 19-64 years who have chronic kidney failure, nephrotic syndrome, asplenia, or immunocompromised conditions. People who received 1-2 doses of PPSV23 before age 65 years should receive another dose of PPSV23 vaccine at age 65 years or later if at least 5 years have passed since the previous dose. Doses of PPSV23 are not  needed for people immunized with PPSV23 at or after age 65 years.  Meningococcal vaccine. Adults with asplenia or persistent complement component deficiencies should receive 2 doses of quadrivalent meningococcal conjugate (MenACWY-D) vaccine. The doses should be obtained   at least 2 months apart. Microbiologists working with certain meningococcal bacteria, [place] recruits, people at risk during an outbreak, and people who travel to or live in countries with a high rate of meningitis should be immunized. A first-year college student up through age 34 years who is living in a residence hall should receive a dose if she did not receive a dose on or after her 16th birthday. Adults who have certain high-risk conditions should receive one or more doses of vaccine.  Hepatitis A vaccine. Adults who wish to be protected from this disease, have certain high-risk conditions, work with hepatitis A-infected animals, work in hepatitis A research labs, or travel to or work in countries with a high rate of hepatitis A should be immunized. Adults who were previously unvaccinated and who anticipate close contact with an international adoptee during the first 60 days after arrival in the [place] from a country with a high rate of hepatitis A should be immunized.  Hepatitis B vaccine. Adults who wish to be protected from this disease, have certain high-risk conditions, may be exposed to blood or other infectious body fluids, are household contacts or sex partners of hepatitis B positive people, are clients or workers in certain care facilities, or travel to or work in countries with a high rate of hepatitis B should be immunized.  Haemophilus influenzae type b (Hib) vaccine. A previously unvaccinated person with asplenia or sickle cell disease or having a scheduled splenectomy should receive 1 dose of Hib vaccine. Regardless of previous immunization, a recipient of a hematopoietic stem cell transplant should receive a  3-dose series 6-12 months after her successful transplant. Hib vaccine is not recommended for adults with HIV infection. Preventive Services / Frequency Ages 35 to 4 years  Blood pressure check.** / Every 3-5 years.  Lipid and cholesterol check.** / Every 5 years beginning at age 60.  Clinical breast exam.** / Every 3 years for women in their 71s and 10s.  BRCA-related cancer risk assessment.** / For women who have family members with a BRCA-related cancer (breast, ovarian, tubal, or peritoneal cancers).  Pap test.** / Every 2 years from ages 76 through 26. Every 3 years starting at age 61 through age 76 or 93 with a history of 3 consecutive normal Pap tests.  HPV screening.** / Every 3 years from ages 37 through ages 60 to 51 with a history of 3 consecutive normal Pap tests.  Hepatitis C blood test.** / For any individual with known risks for hepatitis C.  Skin self-exam. / Monthly.  Influenza vaccine. / Every year.  Tetanus, diphtheria, and acellular pertussis (Tdap, Td) vaccine.** / Consult your health care provider. Pregnant women should receive 1 dose of Tdap vaccine during each pregnancy. 1 dose of Td every 10 years.  Varicella vaccine.** / Consult your health care provider. Pregnant females who do not have evidence of immunity should receive the first dose after pregnancy.  HPV vaccine. / 3 doses over 6 months, if 93 and younger. The vaccine is not recommended for use in pregnant females. However, pregnancy testing is not needed before receiving a dose.  Measles, mumps, rubella (MMR) vaccine.** / You need at least 1 dose of MMR if you were born in 1957 or later. You may also need a 2nd dose. For females of childbearing age, rubella immunity should be determined. If there is no evidence of immunity, females who are not pregnant should be vaccinated. If there is no evidence of immunity, females who are  pregnant should delay immunization until after pregnancy.  Pneumococcal  13-valent conjugate (PCV13) vaccine.** / Consult your health care provider.  Pneumococcal polysaccharide (PPSV23) vaccine.** / 1 to 2 doses if you smoke cigarettes or if you have certain conditions.  Meningococcal vaccine.** / 1 dose if you are age 68 to 8 years and a Market researcher living in a residence hall, or have one of several medical conditions, you need to get vaccinated against meningococcal disease. You may also need additional booster doses.  Hepatitis A vaccine.** / Consult your health care provider.  Hepatitis B vaccine.** / Consult your health care provider.  Haemophilus influenzae type b (Hib) vaccine.** / Consult your health care provider. Ages 7 to 53 years  Blood pressure check.** / Every year.  Lipid and cholesterol check.** / Every 5 years beginning at age 25 years.  Lung cancer screening. / Every year if you are aged 11-80 years and have a 30-pack-year history of smoking and currently smoke or have quit within the past 15 years. Yearly screening is stopped once you have quit smoking for at least 15 years or develop a health problem that would prevent you from having lung cancer treatment.  Clinical breast exam.** / Every year after age 48 years.  BRCA-related cancer risk assessment.** / For women who have family members with a BRCA-related cancer (breast, ovarian, tubal, or peritoneal cancers).  Mammogram.** / Every year beginning at age 41 years and continuing for as long as you are in good health. Consult with your health care provider.  Pap test.** / Every 3 years starting at age 65 years through age 37 or 70 years with a history of 3 consecutive normal Pap tests.  HPV screening.** / Every 3 years from ages 72 years through ages 60 to 40 years with a history of 3 consecutive normal Pap tests.  Fecal occult blood test (FOBT) of stool. / Every year beginning at age 21 years and continuing until age 5 years. You may not need to do this test if you get  a colonoscopy every 10 years.  Flexible sigmoidoscopy or colonoscopy.** / Every 5 years for a flexible sigmoidoscopy or every 10 years for a colonoscopy beginning at age 35 years and continuing until age 48 years.  Hepatitis C blood test.** / For all people born from [birth year] through 1965 and any individual with known risks for hepatitis C.  Skin self-exam. / Monthly.  Influenza vaccine. / Every year.  Tetanus, diphtheria, and acellular pertussis (Tdap/Td) vaccine.** / Consult your health care provider. Pregnant women should receive 1 dose of Tdap vaccine during each pregnancy. 1 dose of Td every 10 years.  Varicella vaccine.** / Consult your health care provider. Pregnant females who do not have evidence of immunity should receive the first dose after pregnancy.  Zoster vaccine.** / 1 dose for adults aged 30 years or older.  Measles, mumps, rubella (MMR) vaccine.** / You need at least 1 dose of MMR if you were born in 1957 or later. You may also need a second dose. For females of childbearing age, rubella immunity should be determined. If there is no evidence of immunity, females who are not pregnant should be vaccinated. If there is no evidence of immunity, females who are pregnant should delay immunization until after pregnancy.  Pneumococcal 13-valent conjugate (PCV13) vaccine.** / Consult your health care provider.  Pneumococcal polysaccharide (PPSV23) vaccine.** / 1 to 2 doses if you smoke cigarettes or if you have certain conditions.  Meningococcal vaccine.** /  Consult your health care provider.  Hepatitis A vaccine.** / Consult your health care provider.  Hepatitis B vaccine.** / Consult your health care provider.  Haemophilus influenzae type b (Hib) vaccine.** / Consult your health care provider. Ages 64 years and over  Blood pressure check.** / Every year.  Lipid and cholesterol check.** / Every 5 years beginning at age 23 years.  Lung cancer screening. / Every year if you  are aged 16-80 years and have a 30-pack-year history of smoking and currently smoke or have quit within the past 15 years. Yearly screening is stopped once you have quit smoking for at least 15 years or develop a health problem that would prevent you from having lung cancer treatment.  Clinical breast exam.** / Every year after age 74 years.  BRCA-related cancer risk assessment.** / For women who have family members with a BRCA-related cancer (breast, ovarian, tubal, or peritoneal cancers).  Mammogram.** / Every year beginning at age 44 years and continuing for as long as you are in good health. Consult with your health care provider.  Pap test.** / Every 3 years starting at age 58 years through age 22 or 39 years with 3 consecutive normal Pap tests. Testing can be stopped between 65 and 70 years with 3 consecutive normal Pap tests and no abnormal Pap or HPV tests in the past 10 years.  HPV screening.** / Every 3 years from ages 64 years through ages 70 or 61 years with a history of 3 consecutive normal Pap tests. Testing can be stopped between 65 and 70 years with 3 consecutive normal Pap tests and no abnormal Pap or HPV tests in the past 10 years.  Fecal occult blood test (FOBT) of stool. / Every year beginning at age 40 years and continuing until age 27 years. You may not need to do this test if you get a colonoscopy every 10 years.  Flexible sigmoidoscopy or colonoscopy.** / Every 5 years for a flexible sigmoidoscopy or every 10 years for a colonoscopy beginning at age 7 years and continuing until age 32 years.  Hepatitis C blood test.** / For all people born from [birth year] through 1965 and any individual with known risks for hepatitis C.  Osteoporosis screening.** / A one-time screening for women ages 30 years and over and women at risk for fractures or osteoporosis.  Skin self-exam. / Monthly.  Influenza vaccine. / Every year.  Tetanus, diphtheria, and acellular pertussis (Tdap/Td)  vaccine.** / 1 dose of Td every 10 years.  Varicella vaccine.** / Consult your health care provider.  Zoster vaccine.** / 1 dose for adults aged 35 years or older.  Pneumococcal 13-valent conjugate (PCV13) vaccine.** / Consult your health care provider.  Pneumococcal polysaccharide (PPSV23) vaccine.** / 1 dose for all adults aged 46 years and older.  Meningococcal vaccine.** / Consult your health care provider.  Hepatitis A vaccine.** / Consult your health care provider.  Hepatitis B vaccine.** / Consult your health care provider.  Haemophilus influenzae type b (Hib) vaccine.** / Consult your health care provider. ** Family history and personal history of risk and conditions may change your health care provider's recommendations.   This information is not intended to replace advice given to you by your health care provider. Make sure you discuss any questions you have with your health care provider.   Document Released: 12/15/2001 Document Revised: 11/09/2014 Document Reviewed: 03/16/2011 Elsevier Interactive Patient Education [employer].

## 2015-11-25 NOTE — Progress Notes (Signed)
Subjective:   Briana Craig is a 70 y.o. female who presents for Medicare Annual (Subsequent) preventive examination.  Review of Systems:   Review of Systems  Constitutional: Negative for activity change, appetite change and fatigue.  HENT: Negative for hearing loss, congestion, tinnitus and ear discharge.   Eyes: Negative for visual disturbance (see optho q1y -- vision corrected to 20/20 with glasses).  Respiratory: Negative for cough, chest tightness and shortness of breath.   Cardiovascular: Negative for chest pain, palpitations and leg swelling.  Gastrointestinal: Negative for abdominal pain, diarrhea, constipation and abdominal distention.  Genitourinary: Negative for urgency, frequency, decreased urine volume and difficulty urinating.  Musculoskeletal: Negative for back pain, arthralgias and gait problem.  Skin: Negative for color change, pallor and rash.  Neurological: Negative for dizziness, light-headedness, numbness and headaches.  Hematological: Negative for adenopathy. Does not bruise/bleed easily.  Psychiatric/Behavioral: Negative for suicidal ideas, confusion, sleep disturbance, self-injury, dysphoric mood, decreased concentration and agitation.  Pt is able to read and write and can do all ADLs No risk for falling No abuse/ violence in home           Objective:     Vitals: BP 122/82 mmHg  Pulse 78  Temp(Src) 97.8 F (36.6 C) (Oral)  Ht 5\' 2"  (1.575 m)  Wt 117 lb 9.6 oz (53.343 kg)  BMI 21.50 kg/m2  SpO2 96% BP 122/82 mmHg  Pulse 78  Temp(Src) 97.8 F (36.6 C) (Oral)  Ht 5\' 2"  (1.575 m)  Wt 117 lb 9.6 oz (53.343 kg)  BMI 21.50 kg/m2  SpO2 96% General appearance: alert, cooperative, appears stated age and no distress Head: Normocephalic, without obvious abnormality, atraumatic Eyes: negative findings: lids and lashes normal, conjunctivae and sclerae normal and pupils equal, round, reactive to light and accomodation Ears: normal TM's and external ear  canals both ears Nose: Nares normal. Septum midline. Mucosa normal. No drainage or sinus tenderness. Throat: lips, mucosa, and tongue normal; teeth and gums normal Neck: no adenopathy, supple, symmetrical, trachea midline and thyroid not enlarged, symmetric, no tenderness/mass/nodules Back: symmetric, no curvature. ROM normal. No CVA tenderness. Lungs: clear to auscultation bilaterally Breasts: normal appearance, no masses or tenderness Heart: regular rate and rhythm, S1, S2 normal, no murmur, click, rub or gallop Abdomen: soft, non-tender; bowel sounds normal; no masses,  no organomegaly Pelvic: not indicated; post-menopausal, no abnormal Pap smears in past Extremities: extremities normal, atraumatic, no cyanosis or edema Pulses: 2+ and symmetric Skin: Skin color, texture, turgor normal. No rashes or lesions Lymph nodes: Cervical, supraclavicular, and axillary nodes normal. Neurologic: Alert and oriented X 3, normal strength and tone. Normal symmetric reflexes. Normal coordination and gait Psych- no depression, no anxiety Tobacco History  Smoking status  . Former Smoker  . Types: Cigarettes  . Quit date: 08/30/1987  Smokeless tobacco  . Never Used     Counseling given: Not Answered   Past Medical History  Diagnosis Date  . Hypertension   . Hyperlipemia   . GERD (gastroesophageal reflux disease)   . PONV (postoperative nausea and vomiting)   . Dysphagia   . Esophageal cancer ([place])   . Impaired fasting blood sugar    Past Surgical History  Procedure Laterality Date  . Esophagus surgery    . Esophagogastroduodenoscopy  11/17/2011    Procedure: ESOPHAGOGASTRODUODENOSCOPY (EGD);  Surgeon: Owens Loffler, MD;  Location: [facility];  Service: Endoscopy;  Laterality: N/A;  . Eye surgery      both cataracts  . Direct laryngoscopy with  botox injection      tx paralysed vocal cord  . Ganglion cyst excision Left 01/26/2013    Procedure: LEFT FLEXOR CARPI RADIALIS  RELEASE AND  SCAPHO TRAPEZIAL TRAPEZOID DEBRIDEMENT ;  Surgeon: Tennis Must, MD;  Location: [facility];  Service: Orthopedics;  Laterality: Left;   Family History  Problem Relation Age of Onset  . Colon cancer Neg Hx   . Alcohol abuse Sister   . AAA (abdominal aortic aneurysm) Mother   . COPD Father    History  Sexual Activity  . Sexual Activity:  . Partners: Male  . Birth Control/ Protection: Post-menopausal    Outpatient Encounter Prescriptions as of 11/25/2015  Medication Sig  . CALCIUM-VITAMIN D PO Take 600 mg by mouth daily.   . cetirizine (ZYRTEC) 10 MG tablet Take 10 mg by mouth as needed for allergies.  . famotidine (PEPCID) 20 MG tablet Take 1 tablet (20 mg total) by mouth 2 (two) times daily. (Patient taking differently: Take 20 mg by mouth daily. )  . fluticasone (FLONASE) 50 MCG/ACT nasal spray Place 2 sprays into the nose daily as needed.   Marland Kitchen glucose blood test strip Use as instructed--  Check glucose qd-- one touch ultra mini  . Multiple Vitamins-Minerals (MULTIVITAMIN PO) Take 1 tablet by mouth daily.   . Omega-3 Fatty Acids (FISH OIL PO) Take 1,200 mg by mouth daily.   . pantoprazole (PROTONIX) 40 MG tablet Take 1 tablet (40 mg total) by mouth 2 (two) times daily. (Patient taking differently: Take 40 mg by mouth daily. )  . simvastatin (ZOCOR) 20 MG tablet TAKE 1 TABLET (20 MG TOTAL) BY MOUTH DAILY AT 6 PM. REPEAT LABS ARE DUE NOW  . [DISCONTINUED] simvastatin (ZOCOR) 20 MG tablet TAKE 1 TABLET (20 MG TOTAL) BY MOUTH DAILY AT 6 PM. REPEAT LABS ARE DUE NOW   No facility-administered encounter medications on file as of 11/25/2015.    Activities of Daily Living In your present state of health, do you have any difficulty performing the following activities: 11/25/2015 11/25/2015  Hearing? N N  Vision? N N  Difficulty concentrating or making decisions? N N  Walking or climbing stairs? N N  Dressing or bathing? N N  Doing errands, shopping? N N    Patient Care  Team: Rosalita Chessman, DO as PCP - General (Family Medicine) Melida Quitter, MD as Consulting Physician (Otolaryngology) Ladell Pier, MD as Consulting Physician (Oncology)    Assessment:    cpe Exercise Activities and Dietary recommendations    Goals    None     Fall Risk Fall Risk  11/25/2015 11/25/2015 02/25/2015 12/06/2014 09/25/2014  Falls in the past year? No No No No No   Depression Screen PHQ 2/9 Scores 11/25/2015 11/25/2015 02/25/2015 12/06/2014  PHQ - 2 Score 0 0 0 0     Cognitive Testing mmse 30/30  Immunization History  Administered Date(s) Administered  . Influenza, High Dose Seasonal PF 08/16/2014, 08/13/2015  . Influenza-Unspecified 08/13/2015  . Pneumococcal Conjugate-13 09/03/2014  . Pneumococcal Polysaccharide-23 12/03/2010  . Tdap 05/01/2013   Screening Tests Health Maintenance  Topic Date Due  . Hepatitis C Screening  May 21, 1946  . FOOT EXAM  10/12/1954  . DEXA SCAN  02/23/2016 (Originally 10/12/2009)  . MAMMOGRAM  11/24/2016 (Originally 10/12/1994)  . URINE MICROALBUMIN  02/04/2016  . HEMOGLOBIN A1C  05/24/2016  . OPHTHALMOLOGY EXAM  05/24/2016  . INFLUENZA VACCINE  06/02/2016  . COLONOSCOPY  01/04/2021  . TETANUS/TDAP  05/02/2023  . ZOSTAVAX  Addressed  . PNA vac Low Risk Adult  Completed      Plan:   see AVS During the course of the visit the patient was educated and counseled about the following appropriate screening and preventive services:   Vaccines to include Pneumoccal, Influenza, Hepatitis B, Td, Zostavax, HCV  Electrocardiogram  Cardiovascular Disease  Colorectal cancer screening  Bone density screening  Diabetes screening  Glaucoma screening  Mammography/PAP  Nutrition counseling   Patient Instructions (the written plan) was given to the patient.  1. Estrogen deficiency  - DG Bone Density; Future  2. Preventative health care  - DG Bone Density; Future  3. Need for hepatitis C screening test  - Hepatitis C  antibody  4. DM (diabetes mellitus) type II controlled peripheral vascular disorder (HCC) Diet controlled - CBC with Differential/Platelet - POCT urinalysis dipstick - Microalbumin / creatinine urine ratio  5. Hyperlipidemia con't pravastatin Check labs - DG Bone Density; Future - CBC with Differential/Platelet - simvastatin (ZOCOR) 20 MG tablet; TAKE 1 TABLET (20 MG TOTAL) BY MOUTH DAILY AT 6 PM. REPEAT LABS ARE DUE NOW  Dispense: 90 tablet; Refill: 1  6. Essential hypertension  - DG Bone Density; Future - CBC with Differential/Platelet  Garnet Koyanagi, DO  11/25/2015

## 2015-11-26 LAB — HEPATITIS C ANTIBODY: HCV Ab: NEGATIVE

## 2015-11-29 ENCOUNTER — Ambulatory Visit (HOSPITAL_BASED_OUTPATIENT_CLINIC_OR_DEPARTMENT_OTHER)
Admission: RE | Admit: 2015-11-29 | Discharge: 2015-11-29 | Disposition: A | Discharge status: Home / Self Care | Payer: Medicare Other | Source: Ambulatory Visit | Attending: Family Medicine | Admitting: Family Medicine

## 2015-11-29 DIAGNOSIS — M8580 Other specified disorders of bone density and structure, unspecified site: Secondary | ICD-10-CM | POA: Insufficient documentation

## 2015-11-29 DIAGNOSIS — E785 Hyperlipidemia, unspecified: Secondary | ICD-10-CM | POA: Insufficient documentation

## 2015-11-29 DIAGNOSIS — I10 Essential (primary) hypertension: Secondary | ICD-10-CM | POA: Insufficient documentation

## 2015-11-29 DIAGNOSIS — E2839 Other primary ovarian failure: Secondary | ICD-10-CM | POA: Insufficient documentation

## 2015-11-29 DIAGNOSIS — M810 Age-related osteoporosis without current pathological fracture: Secondary | ICD-10-CM | POA: Diagnosis present

## 2015-11-29 DIAGNOSIS — Z0000 Encounter for general adult medical examination without abnormal findings: Secondary | ICD-10-CM | POA: Insufficient documentation

## 2015-11-29 DIAGNOSIS — Z87891 Personal history of nicotine dependence: Secondary | ICD-10-CM | POA: Insufficient documentation

## 2015-11-29 DIAGNOSIS — M8589 Other specified disorders of bone density and structure, multiple sites: Secondary | ICD-10-CM | POA: Diagnosis not present

## 2015-12-18 DIAGNOSIS — H40023 Open angle with borderline findings, high risk, bilateral: Secondary | ICD-10-CM | POA: Diagnosis not present

## 2015-12-20 DIAGNOSIS — M9902 Segmental and somatic dysfunction of thoracic region: Secondary | ICD-10-CM | POA: Diagnosis not present

## 2015-12-20 DIAGNOSIS — M542 Cervicalgia: Secondary | ICD-10-CM | POA: Diagnosis not present

## 2015-12-20 DIAGNOSIS — M9904 Segmental and somatic dysfunction of sacral region: Secondary | ICD-10-CM | POA: Diagnosis not present

## 2015-12-20 DIAGNOSIS — M9901 Segmental and somatic dysfunction of cervical region: Secondary | ICD-10-CM | POA: Diagnosis not present

## 2015-12-23 DIAGNOSIS — M542 Cervicalgia: Secondary | ICD-10-CM | POA: Diagnosis not present

## 2015-12-23 DIAGNOSIS — M9901 Segmental and somatic dysfunction of cervical region: Secondary | ICD-10-CM | POA: Diagnosis not present

## 2015-12-23 DIAGNOSIS — M9902 Segmental and somatic dysfunction of thoracic region: Secondary | ICD-10-CM | POA: Diagnosis not present

## 2015-12-23 DIAGNOSIS — M9904 Segmental and somatic dysfunction of sacral region: Secondary | ICD-10-CM | POA: Diagnosis not present

## 2015-12-25 DIAGNOSIS — M542 Cervicalgia: Secondary | ICD-10-CM | POA: Diagnosis not present

## 2015-12-25 DIAGNOSIS — M9901 Segmental and somatic dysfunction of cervical region: Secondary | ICD-10-CM | POA: Diagnosis not present

## 2015-12-25 DIAGNOSIS — M9904 Segmental and somatic dysfunction of sacral region: Secondary | ICD-10-CM | POA: Diagnosis not present

## 2015-12-25 DIAGNOSIS — M9902 Segmental and somatic dysfunction of thoracic region: Secondary | ICD-10-CM | POA: Diagnosis not present

## 2015-12-27 DIAGNOSIS — M9902 Segmental and somatic dysfunction of thoracic region: Secondary | ICD-10-CM | POA: Diagnosis not present

## 2015-12-27 DIAGNOSIS — M9904 Segmental and somatic dysfunction of sacral region: Secondary | ICD-10-CM | POA: Diagnosis not present

## 2015-12-27 DIAGNOSIS — M9901 Segmental and somatic dysfunction of cervical region: Secondary | ICD-10-CM | POA: Diagnosis not present

## 2015-12-27 DIAGNOSIS — M542 Cervicalgia: Secondary | ICD-10-CM | POA: Diagnosis not present

## 2015-12-30 DIAGNOSIS — M9904 Segmental and somatic dysfunction of sacral region: Secondary | ICD-10-CM | POA: Diagnosis not present

## 2015-12-30 DIAGNOSIS — M542 Cervicalgia: Secondary | ICD-10-CM | POA: Diagnosis not present

## 2015-12-30 DIAGNOSIS — M9901 Segmental and somatic dysfunction of cervical region: Secondary | ICD-10-CM | POA: Diagnosis not present

## 2015-12-30 DIAGNOSIS — M9902 Segmental and somatic dysfunction of thoracic region: Secondary | ICD-10-CM | POA: Diagnosis not present

## 2016-01-01 DIAGNOSIS — M9901 Segmental and somatic dysfunction of cervical region: Secondary | ICD-10-CM | POA: Diagnosis not present

## 2016-01-01 DIAGNOSIS — M9904 Segmental and somatic dysfunction of sacral region: Secondary | ICD-10-CM | POA: Diagnosis not present

## 2016-01-01 DIAGNOSIS — M542 Cervicalgia: Secondary | ICD-10-CM | POA: Diagnosis not present

## 2016-01-01 DIAGNOSIS — M9902 Segmental and somatic dysfunction of thoracic region: Secondary | ICD-10-CM | POA: Diagnosis not present

## 2016-01-03 DIAGNOSIS — M9904 Segmental and somatic dysfunction of sacral region: Secondary | ICD-10-CM | POA: Diagnosis not present

## 2016-01-03 DIAGNOSIS — M9901 Segmental and somatic dysfunction of cervical region: Secondary | ICD-10-CM | POA: Diagnosis not present

## 2016-01-03 DIAGNOSIS — M9902 Segmental and somatic dysfunction of thoracic region: Secondary | ICD-10-CM | POA: Diagnosis not present

## 2016-01-03 DIAGNOSIS — M542 Cervicalgia: Secondary | ICD-10-CM | POA: Diagnosis not present

## 2016-01-06 DIAGNOSIS — M9904 Segmental and somatic dysfunction of sacral region: Secondary | ICD-10-CM | POA: Diagnosis not present

## 2016-01-06 DIAGNOSIS — M542 Cervicalgia: Secondary | ICD-10-CM | POA: Diagnosis not present

## 2016-01-06 DIAGNOSIS — M9901 Segmental and somatic dysfunction of cervical region: Secondary | ICD-10-CM | POA: Diagnosis not present

## 2016-01-06 DIAGNOSIS — M9902 Segmental and somatic dysfunction of thoracic region: Secondary | ICD-10-CM | POA: Diagnosis not present

## 2016-01-08 DIAGNOSIS — M9904 Segmental and somatic dysfunction of sacral region: Secondary | ICD-10-CM | POA: Diagnosis not present

## 2016-01-08 DIAGNOSIS — M542 Cervicalgia: Secondary | ICD-10-CM | POA: Diagnosis not present

## 2016-01-08 DIAGNOSIS — M9901 Segmental and somatic dysfunction of cervical region: Secondary | ICD-10-CM | POA: Diagnosis not present

## 2016-01-08 DIAGNOSIS — M9902 Segmental and somatic dysfunction of thoracic region: Secondary | ICD-10-CM | POA: Diagnosis not present

## 2016-01-13 DIAGNOSIS — M9901 Segmental and somatic dysfunction of cervical region: Secondary | ICD-10-CM | POA: Diagnosis not present

## 2016-01-13 DIAGNOSIS — M542 Cervicalgia: Secondary | ICD-10-CM | POA: Diagnosis not present

## 2016-01-13 DIAGNOSIS — M9902 Segmental and somatic dysfunction of thoracic region: Secondary | ICD-10-CM | POA: Diagnosis not present

## 2016-01-13 DIAGNOSIS — M9904 Segmental and somatic dysfunction of sacral region: Secondary | ICD-10-CM | POA: Diagnosis not present

## 2016-01-15 DIAGNOSIS — M9901 Segmental and somatic dysfunction of cervical region: Secondary | ICD-10-CM | POA: Diagnosis not present

## 2016-01-15 DIAGNOSIS — M9902 Segmental and somatic dysfunction of thoracic region: Secondary | ICD-10-CM | POA: Diagnosis not present

## 2016-01-15 DIAGNOSIS — M9904 Segmental and somatic dysfunction of sacral region: Secondary | ICD-10-CM | POA: Diagnosis not present

## 2016-01-15 DIAGNOSIS — M542 Cervicalgia: Secondary | ICD-10-CM | POA: Diagnosis not present

## 2016-01-20 DIAGNOSIS — M9902 Segmental and somatic dysfunction of thoracic region: Secondary | ICD-10-CM | POA: Diagnosis not present

## 2016-01-20 DIAGNOSIS — M542 Cervicalgia: Secondary | ICD-10-CM | POA: Diagnosis not present

## 2016-01-20 DIAGNOSIS — M9904 Segmental and somatic dysfunction of sacral region: Secondary | ICD-10-CM | POA: Diagnosis not present

## 2016-01-20 DIAGNOSIS — M9901 Segmental and somatic dysfunction of cervical region: Secondary | ICD-10-CM | POA: Diagnosis not present

## 2016-01-22 DIAGNOSIS — M542 Cervicalgia: Secondary | ICD-10-CM | POA: Diagnosis not present

## 2016-01-22 DIAGNOSIS — M9902 Segmental and somatic dysfunction of thoracic region: Secondary | ICD-10-CM | POA: Diagnosis not present

## 2016-01-22 DIAGNOSIS — M9904 Segmental and somatic dysfunction of sacral region: Secondary | ICD-10-CM | POA: Diagnosis not present

## 2016-01-22 DIAGNOSIS — M9901 Segmental and somatic dysfunction of cervical region: Secondary | ICD-10-CM | POA: Diagnosis not present

## 2016-01-27 DIAGNOSIS — M9901 Segmental and somatic dysfunction of cervical region: Secondary | ICD-10-CM | POA: Diagnosis not present

## 2016-01-27 DIAGNOSIS — M9902 Segmental and somatic dysfunction of thoracic region: Secondary | ICD-10-CM | POA: Diagnosis not present

## 2016-01-27 DIAGNOSIS — M9904 Segmental and somatic dysfunction of sacral region: Secondary | ICD-10-CM | POA: Diagnosis not present

## 2016-01-27 DIAGNOSIS — M542 Cervicalgia: Secondary | ICD-10-CM | POA: Diagnosis not present

## 2016-01-29 DIAGNOSIS — M9902 Segmental and somatic dysfunction of thoracic region: Secondary | ICD-10-CM | POA: Diagnosis not present

## 2016-01-29 DIAGNOSIS — M542 Cervicalgia: Secondary | ICD-10-CM | POA: Diagnosis not present

## 2016-01-29 DIAGNOSIS — M9901 Segmental and somatic dysfunction of cervical region: Secondary | ICD-10-CM | POA: Diagnosis not present

## 2016-01-29 DIAGNOSIS — M9904 Segmental and somatic dysfunction of sacral region: Secondary | ICD-10-CM | POA: Diagnosis not present

## 2016-02-03 DIAGNOSIS — M9902 Segmental and somatic dysfunction of thoracic region: Secondary | ICD-10-CM | POA: Diagnosis not present

## 2016-02-03 DIAGNOSIS — M9904 Segmental and somatic dysfunction of sacral region: Secondary | ICD-10-CM | POA: Diagnosis not present

## 2016-02-03 DIAGNOSIS — M542 Cervicalgia: Secondary | ICD-10-CM | POA: Diagnosis not present

## 2016-02-03 DIAGNOSIS — M9901 Segmental and somatic dysfunction of cervical region: Secondary | ICD-10-CM | POA: Diagnosis not present

## 2016-02-05 DIAGNOSIS — M9904 Segmental and somatic dysfunction of sacral region: Secondary | ICD-10-CM | POA: Diagnosis not present

## 2016-02-05 DIAGNOSIS — M542 Cervicalgia: Secondary | ICD-10-CM | POA: Diagnosis not present

## 2016-02-05 DIAGNOSIS — M9901 Segmental and somatic dysfunction of cervical region: Secondary | ICD-10-CM | POA: Diagnosis not present

## 2016-02-05 DIAGNOSIS — M9902 Segmental and somatic dysfunction of thoracic region: Secondary | ICD-10-CM | POA: Diagnosis not present

## 2016-02-10 DIAGNOSIS — M542 Cervicalgia: Secondary | ICD-10-CM | POA: Diagnosis not present

## 2016-02-10 DIAGNOSIS — M9904 Segmental and somatic dysfunction of sacral region: Secondary | ICD-10-CM | POA: Diagnosis not present

## 2016-02-10 DIAGNOSIS — M9901 Segmental and somatic dysfunction of cervical region: Secondary | ICD-10-CM | POA: Diagnosis not present

## 2016-02-10 DIAGNOSIS — M9902 Segmental and somatic dysfunction of thoracic region: Secondary | ICD-10-CM | POA: Diagnosis not present

## 2016-02-12 DIAGNOSIS — M9902 Segmental and somatic dysfunction of thoracic region: Secondary | ICD-10-CM | POA: Diagnosis not present

## 2016-02-12 DIAGNOSIS — M9904 Segmental and somatic dysfunction of sacral region: Secondary | ICD-10-CM | POA: Diagnosis not present

## 2016-02-12 DIAGNOSIS — M542 Cervicalgia: Secondary | ICD-10-CM | POA: Diagnosis not present

## 2016-02-12 DIAGNOSIS — M9901 Segmental and somatic dysfunction of cervical region: Secondary | ICD-10-CM | POA: Diagnosis not present

## 2016-02-19 DIAGNOSIS — M542 Cervicalgia: Secondary | ICD-10-CM | POA: Diagnosis not present

## 2016-02-19 DIAGNOSIS — M9901 Segmental and somatic dysfunction of cervical region: Secondary | ICD-10-CM | POA: Diagnosis not present

## 2016-02-19 DIAGNOSIS — M9904 Segmental and somatic dysfunction of sacral region: Secondary | ICD-10-CM | POA: Diagnosis not present

## 2016-02-19 DIAGNOSIS — M9902 Segmental and somatic dysfunction of thoracic region: Secondary | ICD-10-CM | POA: Diagnosis not present

## 2016-02-26 DIAGNOSIS — M9901 Segmental and somatic dysfunction of cervical region: Secondary | ICD-10-CM | POA: Diagnosis not present

## 2016-02-26 DIAGNOSIS — M542 Cervicalgia: Secondary | ICD-10-CM | POA: Diagnosis not present

## 2016-02-26 DIAGNOSIS — M9902 Segmental and somatic dysfunction of thoracic region: Secondary | ICD-10-CM | POA: Diagnosis not present

## 2016-02-26 DIAGNOSIS — M9904 Segmental and somatic dysfunction of sacral region: Secondary | ICD-10-CM | POA: Diagnosis not present

## 2016-03-06 DIAGNOSIS — M9902 Segmental and somatic dysfunction of thoracic region: Secondary | ICD-10-CM | POA: Diagnosis not present

## 2016-03-06 DIAGNOSIS — M542 Cervicalgia: Secondary | ICD-10-CM | POA: Diagnosis not present

## 2016-03-06 DIAGNOSIS — M9901 Segmental and somatic dysfunction of cervical region: Secondary | ICD-10-CM | POA: Diagnosis not present

## 2016-03-06 DIAGNOSIS — M9904 Segmental and somatic dysfunction of sacral region: Secondary | ICD-10-CM | POA: Diagnosis not present

## 2016-03-18 DIAGNOSIS — M9901 Segmental and somatic dysfunction of cervical region: Secondary | ICD-10-CM | POA: Diagnosis not present

## 2016-03-18 DIAGNOSIS — M542 Cervicalgia: Secondary | ICD-10-CM | POA: Diagnosis not present

## 2016-03-18 DIAGNOSIS — M9902 Segmental and somatic dysfunction of thoracic region: Secondary | ICD-10-CM | POA: Diagnosis not present

## 2016-03-18 DIAGNOSIS — M9904 Segmental and somatic dysfunction of sacral region: Secondary | ICD-10-CM | POA: Diagnosis not present

## 2016-03-25 DIAGNOSIS — M542 Cervicalgia: Secondary | ICD-10-CM | POA: Diagnosis not present

## 2016-03-25 DIAGNOSIS — M9904 Segmental and somatic dysfunction of sacral region: Secondary | ICD-10-CM | POA: Diagnosis not present

## 2016-03-25 DIAGNOSIS — M9901 Segmental and somatic dysfunction of cervical region: Secondary | ICD-10-CM | POA: Diagnosis not present

## 2016-03-25 DIAGNOSIS — M9902 Segmental and somatic dysfunction of thoracic region: Secondary | ICD-10-CM | POA: Diagnosis not present

## 2016-04-01 DIAGNOSIS — M9902 Segmental and somatic dysfunction of thoracic region: Secondary | ICD-10-CM | POA: Diagnosis not present

## 2016-04-01 DIAGNOSIS — M542 Cervicalgia: Secondary | ICD-10-CM | POA: Diagnosis not present

## 2016-04-01 DIAGNOSIS — M9904 Segmental and somatic dysfunction of sacral region: Secondary | ICD-10-CM | POA: Diagnosis not present

## 2016-04-01 DIAGNOSIS — M9901 Segmental and somatic dysfunction of cervical region: Secondary | ICD-10-CM | POA: Diagnosis not present

## 2016-04-02 ENCOUNTER — Telehealth: Payer: Self-pay | Admitting: Family Medicine

## 2016-04-02 DIAGNOSIS — K219 Gastro-esophageal reflux disease without esophagitis: Secondary | ICD-10-CM

## 2016-04-02 MED ORDER — FAMOTIDINE 20 MG PO TABS: 20.0000 mg | ORAL_TABLET | Freq: Two times a day (BID) | ORAL | Status: DC

## 2016-04-02 NOTE — Telephone Encounter (Signed)
Relation to [id number] Call back [phone] Pharmacy: [facility], [place] [phone] (Phone) [phone] (Fax)         Reason for call:  Patient requesting a refill famotidine (PEPCID) 20 MG tablet. Patient would like PCP to send a few pills to retail  [id number] - [place], [place] - [address] AT [address] [phone] (Phone) [phone] (Fax)      And the rest to mail order.

## 2016-04-02 NOTE — Telephone Encounter (Signed)
Rx faxed to both places.             KP

## 2016-04-08 DIAGNOSIS — M9904 Segmental and somatic dysfunction of sacral region: Secondary | ICD-10-CM | POA: Diagnosis not present

## 2016-04-08 DIAGNOSIS — M9901 Segmental and somatic dysfunction of cervical region: Secondary | ICD-10-CM | POA: Diagnosis not present

## 2016-04-08 DIAGNOSIS — M9902 Segmental and somatic dysfunction of thoracic region: Secondary | ICD-10-CM | POA: Diagnosis not present

## 2016-04-08 DIAGNOSIS — M542 Cervicalgia: Secondary | ICD-10-CM | POA: Diagnosis not present

## 2016-04-15 ENCOUNTER — Other Ambulatory Visit: Payer: Self-pay | Admitting: Family Medicine

## 2016-04-22 DIAGNOSIS — M9904 Segmental and somatic dysfunction of sacral region: Secondary | ICD-10-CM | POA: Diagnosis not present

## 2016-04-22 DIAGNOSIS — M9902 Segmental and somatic dysfunction of thoracic region: Secondary | ICD-10-CM | POA: Diagnosis not present

## 2016-04-22 DIAGNOSIS — M542 Cervicalgia: Secondary | ICD-10-CM | POA: Diagnosis not present

## 2016-04-22 DIAGNOSIS — M9901 Segmental and somatic dysfunction of cervical region: Secondary | ICD-10-CM | POA: Diagnosis not present

## 2016-04-23 ENCOUNTER — Other Ambulatory Visit: Payer: Self-pay | Admitting: Family Medicine

## 2016-05-06 DIAGNOSIS — M9902 Segmental and somatic dysfunction of thoracic region: Secondary | ICD-10-CM | POA: Diagnosis not present

## 2016-05-06 DIAGNOSIS — M9904 Segmental and somatic dysfunction of sacral region: Secondary | ICD-10-CM | POA: Diagnosis not present

## 2016-05-06 DIAGNOSIS — M542 Cervicalgia: Secondary | ICD-10-CM | POA: Diagnosis not present

## 2016-05-06 DIAGNOSIS — M9901 Segmental and somatic dysfunction of cervical region: Secondary | ICD-10-CM | POA: Diagnosis not present

## 2016-05-11 ENCOUNTER — Other Ambulatory Visit (INDEPENDENT_AMBULATORY_CARE_PROVIDER_SITE_OTHER): Payer: Medicare Other

## 2016-05-11 ENCOUNTER — Other Ambulatory Visit: Payer: Self-pay

## 2016-05-11 DIAGNOSIS — E119 Type 2 diabetes mellitus without complications: Secondary | ICD-10-CM | POA: Diagnosis not present

## 2016-05-11 DIAGNOSIS — E785 Hyperlipidemia, unspecified: Secondary | ICD-10-CM

## 2016-05-11 LAB — COMPREHENSIVE METABOLIC PANEL
ALT: 17 U/L (ref 0–35)
AST: 22 U/L (ref 0–37)
Albumin: 4.2 g/dL (ref 3.5–5.2)
Alkaline Phosphatase: 43 U/L (ref 39–117)
BUN: 23 mg/dL (ref 6–23)
CO2: 27 mEq/L (ref 19–32)
Calcium: 9.8 mg/dL (ref 8.4–10.5)
Chloride: 105 mEq/L (ref 96–112)
Creatinine, Ser: 1.09 mg/dL (ref 0.40–1.20)
GFR: 52.51 mL/min — ABNORMAL LOW (ref >60.00)
Glucose, Bld: 111 mg/dL — ABNORMAL HIGH (ref 70–99)
Potassium: 4.2 mEq/L (ref 3.5–5.1)
Sodium: 139 mEq/L (ref 135–145)
Total Bilirubin: 0.6 mg/dL (ref 0.2–1.2)
Total Protein: 7.4 g/dL (ref 6.0–8.3)

## 2016-05-11 LAB — LIPID PANEL
Cholesterol: 191 mg/dL (ref 0–200)
HDL: 84.8 mg/dL (ref >39.00)
LDL Cholesterol: 91 mg/dL (ref 0–99)
NonHDL: 106.16
Total CHOL/HDL Ratio: 2
Triglycerides: 75 mg/dL (ref 0.0–149.0)
VLDL: 15 mg/dL (ref 0.0–40.0)

## 2016-05-11 LAB — HEMOGLOBIN A1C: Hgb A1c MFr Bld: 6.7 % — ABNORMAL HIGH (ref 4.6–6.5)

## 2016-05-15 ENCOUNTER — Telehealth: Payer: Self-pay | Admitting: Family Medicine

## 2016-05-15 NOTE — Telephone Encounter (Signed)
That is part of kidney function-- but we look at bun and cr as well---I'll be watching it If it con't to go down we will refer to nephrology Some dec with age is normal

## 2016-05-15 NOTE — Telephone Encounter (Signed)
Discussed with patient and she verbalized understanding, she will f/u for her recheck in 6 mos

## 2016-05-15 NOTE — Telephone Encounter (Signed)
°  Relationship to patient: Self Can be reached: [phone]  Reason for call: Patient requesting a call back to discuss lab results

## 2016-05-15 NOTE — Telephone Encounter (Signed)
Spoke with patient and she was concerned about the GFR being low. She would like to know what that means. Please advise    KP

## 2016-05-25 ENCOUNTER — Ambulatory Visit: Payer: BLUE CROSS/BLUE SHIELD | Admitting: Family Medicine

## 2016-05-26 ENCOUNTER — Other Ambulatory Visit: Payer: Self-pay

## 2016-05-26 DIAGNOSIS — E785 Hyperlipidemia, unspecified: Secondary | ICD-10-CM

## 2016-05-26 DIAGNOSIS — C159 Malignant neoplasm of esophagus, unspecified: Secondary | ICD-10-CM

## 2016-05-26 MED ORDER — SIMVASTATIN 20 MG PO TABS: 20.0000 mg | ORAL_TABLET | Freq: Every day | ORAL | 1 refills | Status: DC

## 2016-05-26 MED ORDER — PANTOPRAZOLE SODIUM 40 MG PO TBEC: 40.0000 mg | DELAYED_RELEASE_TABLET | Freq: Every day | ORAL | 1 refills | Status: DC

## 2016-06-03 ENCOUNTER — Other Ambulatory Visit: Payer: Self-pay | Admitting: Family Medicine

## 2016-06-03 DIAGNOSIS — M9904 Segmental and somatic dysfunction of sacral region: Secondary | ICD-10-CM | POA: Diagnosis not present

## 2016-06-03 DIAGNOSIS — M9902 Segmental and somatic dysfunction of thoracic region: Secondary | ICD-10-CM | POA: Diagnosis not present

## 2016-06-03 DIAGNOSIS — M9901 Segmental and somatic dysfunction of cervical region: Secondary | ICD-10-CM | POA: Diagnosis not present

## 2016-06-03 DIAGNOSIS — M542 Cervicalgia: Secondary | ICD-10-CM | POA: Diagnosis not present

## 2016-06-03 DIAGNOSIS — E785 Hyperlipidemia, unspecified: Secondary | ICD-10-CM

## 2016-06-16 DIAGNOSIS — H40023 Open angle with borderline findings, high risk, bilateral: Secondary | ICD-10-CM | POA: Diagnosis not present

## 2016-06-17 DIAGNOSIS — M9904 Segmental and somatic dysfunction of sacral region: Secondary | ICD-10-CM | POA: Diagnosis not present

## 2016-06-17 DIAGNOSIS — M9901 Segmental and somatic dysfunction of cervical region: Secondary | ICD-10-CM | POA: Diagnosis not present

## 2016-06-17 DIAGNOSIS — M9902 Segmental and somatic dysfunction of thoracic region: Secondary | ICD-10-CM | POA: Diagnosis not present

## 2016-06-17 DIAGNOSIS — M542 Cervicalgia: Secondary | ICD-10-CM | POA: Diagnosis not present

## 2016-07-08 DIAGNOSIS — M9901 Segmental and somatic dysfunction of cervical region: Secondary | ICD-10-CM | POA: Diagnosis not present

## 2016-07-08 DIAGNOSIS — M9902 Segmental and somatic dysfunction of thoracic region: Secondary | ICD-10-CM | POA: Diagnosis not present

## 2016-07-08 DIAGNOSIS — M9904 Segmental and somatic dysfunction of sacral region: Secondary | ICD-10-CM | POA: Diagnosis not present

## 2016-07-08 DIAGNOSIS — M542 Cervicalgia: Secondary | ICD-10-CM | POA: Diagnosis not present

## 2016-08-05 DIAGNOSIS — M9904 Segmental and somatic dysfunction of sacral region: Secondary | ICD-10-CM | POA: Diagnosis not present

## 2016-08-05 DIAGNOSIS — M9902 Segmental and somatic dysfunction of thoracic region: Secondary | ICD-10-CM | POA: Diagnosis not present

## 2016-08-05 DIAGNOSIS — M9901 Segmental and somatic dysfunction of cervical region: Secondary | ICD-10-CM | POA: Diagnosis not present

## 2016-08-05 DIAGNOSIS — M542 Cervicalgia: Secondary | ICD-10-CM | POA: Diagnosis not present

## 2016-08-13 ENCOUNTER — Telehealth: Payer: Self-pay | Admitting: *Deleted

## 2016-08-13 NOTE — Telephone Encounter (Signed)
Call from pt requesting to move office visit from Nov to late January. She is scheduled for scans and visit at [facility] Jan 15. Pt is over 4 years out from diagnosis. Message to schedulers for appt change.

## 2016-08-20 DIAGNOSIS — Z23 Encounter for immunization: Secondary | ICD-10-CM | POA: Diagnosis not present

## 2016-09-02 DIAGNOSIS — M9901 Segmental and somatic dysfunction of cervical region: Secondary | ICD-10-CM | POA: Diagnosis not present

## 2016-09-02 DIAGNOSIS — M9904 Segmental and somatic dysfunction of sacral region: Secondary | ICD-10-CM | POA: Diagnosis not present

## 2016-09-02 DIAGNOSIS — M9902 Segmental and somatic dysfunction of thoracic region: Secondary | ICD-10-CM | POA: Diagnosis not present

## 2016-09-02 DIAGNOSIS — M542 Cervicalgia: Secondary | ICD-10-CM | POA: Diagnosis not present

## 2016-09-22 ENCOUNTER — Other Ambulatory Visit: Payer: BLUE CROSS/BLUE SHIELD

## 2016-09-22 ENCOUNTER — Ambulatory Visit: Payer: BLUE CROSS/BLUE SHIELD | Admitting: Oncology

## 2016-10-07 DIAGNOSIS — M9901 Segmental and somatic dysfunction of cervical region: Secondary | ICD-10-CM | POA: Diagnosis not present

## 2016-10-07 DIAGNOSIS — M542 Cervicalgia: Secondary | ICD-10-CM | POA: Diagnosis not present

## 2016-10-07 DIAGNOSIS — M9904 Segmental and somatic dysfunction of sacral region: Secondary | ICD-10-CM | POA: Diagnosis not present

## 2016-10-07 DIAGNOSIS — M9902 Segmental and somatic dysfunction of thoracic region: Secondary | ICD-10-CM | POA: Diagnosis not present

## 2016-10-21 ENCOUNTER — Other Ambulatory Visit: Payer: Self-pay | Admitting: Family Medicine

## 2016-10-21 DIAGNOSIS — C159 Malignant neoplasm of esophagus, unspecified: Secondary | ICD-10-CM

## 2016-11-04 DIAGNOSIS — M542 Cervicalgia: Secondary | ICD-10-CM | POA: Diagnosis not present

## 2016-11-04 DIAGNOSIS — M9902 Segmental and somatic dysfunction of thoracic region: Secondary | ICD-10-CM | POA: Diagnosis not present

## 2016-11-04 DIAGNOSIS — M9904 Segmental and somatic dysfunction of sacral region: Secondary | ICD-10-CM | POA: Diagnosis not present

## 2016-11-04 DIAGNOSIS — M9901 Segmental and somatic dysfunction of cervical region: Secondary | ICD-10-CM | POA: Diagnosis not present

## 2016-11-05 ENCOUNTER — Other Ambulatory Visit: Payer: Self-pay | Admitting: Family Medicine

## 2016-11-05 DIAGNOSIS — E785 Hyperlipidemia, unspecified: Secondary | ICD-10-CM

## 2016-11-16 ENCOUNTER — Ambulatory Visit (INDEPENDENT_AMBULATORY_CARE_PROVIDER_SITE_OTHER): Payer: Medicare Other | Admitting: Family Medicine

## 2016-11-16 ENCOUNTER — Encounter: Payer: Self-pay | Admitting: Family Medicine

## 2016-11-16 VITALS — BP 120/86 | HR 70 | Temp 97.7°F | Resp 16 | Ht 62.5 in | Wt 123.4 lb

## 2016-11-16 DIAGNOSIS — C159 Malignant neoplasm of esophagus, unspecified: Secondary | ICD-10-CM | POA: Diagnosis not present

## 2016-11-16 DIAGNOSIS — Z23 Encounter for immunization: Secondary | ICD-10-CM

## 2016-11-16 DIAGNOSIS — E119 Type 2 diabetes mellitus without complications: Secondary | ICD-10-CM

## 2016-11-16 DIAGNOSIS — E1165 Type 2 diabetes mellitus with hyperglycemia: Secondary | ICD-10-CM

## 2016-11-16 DIAGNOSIS — E785 Hyperlipidemia, unspecified: Secondary | ICD-10-CM

## 2016-11-16 DIAGNOSIS — E118 Type 2 diabetes mellitus with unspecified complications: Secondary | ICD-10-CM

## 2016-11-16 DIAGNOSIS — IMO0002 Reserved for concepts with insufficient information to code with codable children: Secondary | ICD-10-CM

## 2016-11-16 LAB — COMPREHENSIVE METABOLIC PANEL
ALT: 15 U/L (ref 0–35)
AST: 20 U/L (ref 0–37)
Albumin: 4.1 g/dL (ref 3.5–5.2)
Alkaline Phosphatase: 41 U/L (ref 39–117)
BUN: 24 mg/dL — ABNORMAL HIGH (ref 6–23)
CO2: 29 mEq/L (ref 19–32)
Calcium: 9.9 mg/dL (ref 8.4–10.5)
Chloride: 104 mEq/L (ref 96–112)
Creatinine, Ser: 1.06 mg/dL (ref 0.40–1.20)
GFR: 54.14 mL/min — ABNORMAL LOW (ref >60.00)
Glucose, Bld: 117 mg/dL — ABNORMAL HIGH (ref 70–99)
Potassium: 4.9 mEq/L (ref 3.5–5.1)
Sodium: 138 mEq/L (ref 135–145)
Total Bilirubin: 0.6 mg/dL (ref 0.2–1.2)
Total Protein: 7.4 g/dL (ref 6.0–8.3)

## 2016-11-16 LAB — LIPID PANEL
Cholesterol: 189 mg/dL (ref 0–200)
HDL: 92 mg/dL (ref >39.00)
LDL Cholesterol: 84 mg/dL (ref 0–99)
NonHDL: 96.59
Total CHOL/HDL Ratio: 2
Triglycerides: 63 mg/dL (ref 0.0–149.0)
VLDL: 12.6 mg/dL (ref 0.0–40.0)

## 2016-11-16 LAB — HEMOGLOBIN A1C: Hgb A1c MFr Bld: 6.8 % — ABNORMAL HIGH (ref 4.6–6.5)

## 2016-11-16 MED ORDER — SIMVASTATIN 20 MG PO TABS: ORAL_TABLET | ORAL | 0 refills | Status: DC

## 2016-11-16 MED ORDER — GLUCOSE BLOOD VI STRP: ORAL_STRIP | 1 refills | Status: DC

## 2016-11-16 MED ORDER — LANCETS MISC: 1 refills | Status: DC

## 2016-11-16 NOTE — Progress Notes (Signed)
Patient ID: Briana Craig, female    DOB: August 17, 1946  Age: 71 y.o. MRN: [id number]    Subjective:  Subjective  HPI Briana Craig presents for lipid, dm and htn.  HPI Briana Craig   Blood pressure range-not checking  Chest pain- no      Dyspnea- no Lightheadedness- no   Edema- no  Other side effects - no   Medication compliance: good Low salt diet- yes    Briana Craig    Blood Sugar ranges-ok  Polyuria- no New Visual problems- no  Hypoglycemic symptoms- no  Other side effects-no Medication compliance - good Last eye exam- 2017 Foot exam- today   HYPERLIPIDEMIA  Medication compliance- good RUQ pain- no  Muscle aches- no Other side effects-no Review of Systems  Constitutional: Negative for appetite change, diaphoresis, fatigue and unexpected weight change.  Eyes: Negative for pain, redness and visual disturbance.  Respiratory: Negative for cough, chest tightness, shortness of breath and wheezing.   Cardiovascular: Negative for chest pain, palpitations and leg swelling.  Endocrine: Negative for cold intolerance, heat intolerance, polydipsia, polyphagia and polyuria.  Genitourinary: Negative for difficulty urinating, dysuria and frequency.  Neurological: Negative for dizziness, light-headedness, numbness and headaches.    History Past Medical History:  Diagnosis Date  . Dysphagia   . Esophageal cancer ([place])   . GERD (gastroesophageal reflux disease)   . Hyperlipemia   . Briana Craig   . Impaired fasting blood sugar   . PONV (postoperative nausea and vomiting)     Briana Craig has a past surgical history that includes Esophagus surgery; Esophagogastroduodenoscopy (11/17/2011); Eye surgery; Direct laryngoscopy with botox injection; and Ganglion cyst excision (Left, 01/26/2013).   Her family history includes AAA (abdominal aortic aneurysm) in her mother; Alcohol abuse in her sister; COPD in her father.Briana Craig reports that Briana Craig quit smoking about 29 years ago. Her smoking use included  Cigarettes. Briana Craig has never used smokeless tobacco. Briana Craig reports that Briana Craig does not drink alcohol. Her drug history is not on file.  Current Outpatient Prescriptions on File Prior to Visit  Medication Sig Dispense Refill  . CALCIUM-VITAMIN D PO Take 600 mg by mouth daily.     . cetirizine (ZYRTEC) 10 MG tablet Take 10 mg by mouth as needed for allergies.    . famotidine (PEPCID) 20 MG tablet TAKE 1 TABLET (20 MG TOTAL) BY MOUTH 2 (TWO) TIMES DAILY. 60 tablet 5  . fluticasone (FLONASE) 50 MCG/ACT nasal spray Place 2 sprays into the nose daily as needed.     . Multiple Vitamins-Minerals (MULTIVITAMIN PO) Take 1 tablet by mouth daily.     . Omega-3 Fatty Acids (FISH OIL PO) Take 1,200 mg by mouth daily.     . pantoprazole (PROTONIX) 40 MG tablet TAKE 1 TABLET DAILY 90 tablet 1   No current facility-administered medications on file prior to visit.      Objective:  Objective  Physical Exam  Constitutional: Briana Craig is oriented to person, place, and time. Briana Craig appears well-developed and well-nourished.  HENT:  Head: Normocephalic and atraumatic.  Eyes: Conjunctivae and EOM are normal.  Neck: Normal range of motion. Neck supple. No JVD present. Carotid bruit is not present. No thyromegaly present.  Cardiovascular: Normal rate, regular rhythm and normal heart sounds.   No murmur heard. Pulmonary/Chest: Effort normal and breath sounds normal. No respiratory distress. Briana Craig has no wheezes. Briana Craig has no rales. Briana Craig exhibits no tenderness.  Musculoskeletal: Briana Craig exhibits no edema.  Neurological: Briana Craig is alert and oriented to person, place, and time.  Psychiatric: Briana Craig has a normal mood and affect.  Nursing note and vitals reviewed. Sensory exam of the foot is normal, tested with the monofilament. Good pulses, no lesions or ulcers, good peripheral pulses.  BP 120/86 (BP Location: Left Arm, Cuff Size: Normal)   Pulse 70   Temp 97.7 F (36.5 C) (Oral)   Resp 16   Ht 5' 2.5" (1.588 m)   Wt 123 lb 6.4 oz (56 kg)    SpO2 94%   BMI 22.21 kg/m  Wt Readings from Last 3 Encounters:  11/16/16 123 lb 6.4 oz (56 kg)  11/25/15 117 lb 9.6 oz (53.3 kg)  09/24/15 118 lb 6.4 oz (53.7 kg)     Lab Results  Component Value Date   WBC 5.9 11/25/2015   HGB 13.1 11/25/2015   HCT 40.0 11/25/2015   PLT 239.0 11/25/2015   GLUCOSE 117 (H) 11/16/2016   CHOL 189 11/16/2016   TRIG 63.0 11/16/2016   HDL 92.00 11/16/2016   LDLCALC 84 11/16/2016   ALT 15 11/16/2016   AST 20 11/16/2016   NA 138 11/16/2016   K 4.9 11/16/2016   CL 104 11/16/2016   CREATININE 1.06 11/16/2016   BUN 24 (H) 11/16/2016   CO2 29 11/16/2016   TSH 2.147 04/24/2013   HGBA1C 6.8 (H) 11/16/2016   MICROALBUR <0.7 11/25/2015    Dg Bone Density  Result Date: 11/29/2015 EXAM: DUAL X-RAY ABSORPTIOMETRY (DXA) FOR BONE MINERAL DENSITY IMPRESSION: Referring Physician:  Rosalita Chessman PATIENT: Name: Briana, Craig Patient ID: [id number] Birth Date: [birth year] Height: 62.0 in. Sex: Female Measured: 11/29/2015 Weight: 116.0 lbs. Indications: Advanced Age, Caucasian, Estrogen Deficiency, Post Menopausal, Previous Tobacco User Fractures: Treatments: Calcium, Vitamin D ASSESSMENT: The BMD measured at Femur Total Left is 0.720 g/cm2 with a T-score of -2.3. This patient is considered osteopenic according to [facility]) criteria. Site Region Measured Date Measured Age WHO YA BMD Classification T-score AP Spine L1-L4 11/29/2015 71.1 Osteopenia -2.0 0.944 g/cm2 DualFemur Total Left 11/29/2015 71.1 years Osteopenia -2.3 0.720 g/cm2 World Health Organization [facility]) criteria for post-menopausal, Caucasian Women: Normal       T-score at or above -1 SD Osteopenia   T-score between -1 and -2.5 SD Osteoporosis T-score at or below -2.5 SD RECOMMENDATION: [place] recommends that FDA-approved medical therapies be considered in postmenopausal women and men age 79 or older with a: 1. Hip or vertebral (clinical or morphometric) fracture.  2. T-score of < -2.5 at the spine or hip. 3. Ten-year fracture probability by FRAX of 3% or greater for hip fracture or 20% or greater for major osteoporotic fracture. All treatment decisions require clinical judgment and consideration of individual patient factors, including patient preferences, co-morbidities, previous drug use, risk factors not captured in the FRAX model (e.g. falls, vitamin D deficiency, increased bone turnover, interval significant decline in bone density) and possible under - or over-estimation of fracture risk by FRAX. All patients should ensure an adequate intake of dietary calcium (1200 mg/d) and vitamin D (800 IU daily) unless contraindicated. FOLLOW-UP: People with diagnosed cases of osteoporosis or at high risk for fracture should have regular bone mineral density tests. For patients eligible for Medicare, routine testing is allowed once every 2 years. The testing frequency can be increased to one year for patients who have rapidly progressing disease, those who are receiving or discontinuing medical therapy to restore bone mass, or have additional risk factors. I have reviewed this report and agree with the above findings.  [facility] Patient: Briana Craig Referring Physician: Rosalita Chessman Birth Date: 12/06/1945 [age] Patient ID: [id number] Height: 62.0 in. Weight: 116.0 lbs. Measured: 11/29/2015 3:09:22 PM (16 SP 2) Sex: Female Ethnicity: White Analyzed: 11/29/2015 3:12:57 PM (16 SP 2) FRAX* 10-year Probability of Fracture Based on femoral neck BMD: DualFemur (Left) Major Osteoporotic Fracture: 11.2% Hip Fracture:                2.5% Population:                  [place] (Caucasian) Risk Factors:                None *FRAX is a Materials engineer of the [employer] of [employer] for Metabolic Bone Disease, a World Pharmacologist (WHO) [facility]. ASSESSMENT: The probability of a major osteoporotic fracture is 11.2% within the next ten  years. The probability of a hip fracture is 2.5% within the next ten years. Electronically Signed   By: Marijo Conception, M.D.   On: 11/29/2015 15:59     Assessment & Plan:  Plan  I am having Briana Craig start on Lancets. I am also having her maintain her CALCIUM-VITAMIN D PO, Multiple Vitamins-Minerals (MULTIVITAMIN PO), Omega-3 Fatty Acids (FISH OIL PO), cetirizine, fluticasone, famotidine, pantoprazole, simvastatin, and glucose blood.  Meds ordered this encounter  Medications  . simvastatin (ZOCOR) 20 MG tablet    Sig: TAKE 1 TABLET DAILY AT 6 P.M.    Dispense:  90 tablet    Refill:  0  . glucose blood test strip    Sig: Use as instructed--  Check glucose qd-- one touch ultra mini    Dispense:  100 each    Refill:  1    E11.9  . Lancets MISC    Sig: Use as directed once a day. DX code E11.9    Dispense:  100 each    Refill:  1    Problem List Items Addressed This Visit      Unprioritized   Cancer of esophagus ([place])    Per oncology      Hyperlipidemia    con't simvastatin Check labs      Relevant Medications   simvastatin (ZOCOR) 20 MG tablet   Other Relevant Orders   Lipid panel (Completed)   Comprehensive metabolic panel (Completed)   Type II Briana Craig mellitus ([place]) - Primary    Check labs con't glucose monitoring hgba1c acceptable, minimize simple carbs. Increase exercise as tolerated. Continue current meds       Relevant Medications   simvastatin (ZOCOR) 20 MG tablet   Lancets MISC   Other Relevant Orders   Hemoglobin A1c (Completed)    Other Visit Diagnoses    Uncontrolled type 2 Briana Craig mellitus with complication, without long-term current use of insulin (HCC)       Relevant Medications   simvastatin (ZOCOR) 20 MG tablet   glucose blood test strip   Need for pneumococcal vaccine       Relevant Orders   Pneumococcal polysaccharide vaccine 23-valent greater than or equal to 2yo subcutaneous/IM (Completed)      Follow-up: Return in about 6 months  (around 05/16/2017) for Briana Craig, hyperlipidemia, Briana Craig II.  Ann Held, DO

## 2016-11-16 NOTE — Assessment & Plan Note (Signed)
con't simvastatin Check labs  

## 2016-11-16 NOTE — Assessment & Plan Note (Signed)
Check labs con't glucose monitoring hgba1c acceptable, minimize simple carbs. Increase exercise as tolerated. Continue current meds

## 2016-11-16 NOTE — Progress Notes (Signed)
Pre visit review using our clinic review tool, if applicable. No additional management support is needed unless otherwise documented below in the visit note. 

## 2016-11-16 NOTE — Progress Notes (Signed)
Subjective:    Patient ID: Briana Craig, female    DOB: 02-13-1946, 71 y.o.   MRN: [id number]  Chief Complaint  Patient presents with  . Follow-up    from lab work done 7/17    HPI Patient is in today for 6 month follow up for diabetes, blood pressure, and cholesterol.  Not checking sugars regularly.  Going to [facility] for last CAT scan.  Past Medical History:  Diagnosis Date  . Dysphagia   . Esophageal cancer ([place])   . GERD (gastroesophageal reflux disease)   . Hyperlipemia   . Hypertension   . Impaired fasting blood sugar   . PONV (postoperative nausea and vomiting)     Past Surgical History:  Procedure Laterality Date  . DIRECT LARYNGOSCOPY WITH BOTOX INJECTION     tx paralysed vocal cord  . ESOPHAGOGASTRODUODENOSCOPY  11/17/2011   Procedure: ESOPHAGOGASTRODUODENOSCOPY (EGD);  Surgeon: Owens Loffler, MD;  Location: [facility];  Service: Endoscopy;  Laterality: N/A;  . ESOPHAGUS SURGERY    . EYE SURGERY     both cataracts  . GANGLION CYST EXCISION Left 01/26/2013   Procedure: LEFT FLEXOR CARPI RADIALIS  RELEASE AND SCAPHO TRAPEZIAL TRAPEZOID DEBRIDEMENT ;  Surgeon: Tennis Must, MD;  Location: [facility];  Service: Orthopedics;  Laterality: Left;    Family History  Problem Relation Age of Onset  . Colon cancer Neg Hx   . Alcohol abuse Sister   . AAA (abdominal aortic aneurysm) Mother   . COPD Father     Social History   Social History  . Marital status: Married    Spouse name: Sonia Side  . Number of children: 0  . Years of education: 58   Occupational History  . RETIRED  Other   Social History Main Topics  . Smoking status: Former Smoker    Types: Cigarettes    Quit date: 08/30/1987  . Smokeless tobacco: Never Used  . Alcohol use No  . Drug use: Unknown  . Sexual activity: Yes    Partners: Male    Birth control/ protection: Post-menopausal   Other Topics Concern  . Not on file   Social History Narrative   Marital Status:  Married  Sonia Side)   Children:  None    Pets: Dog (1)    Living Situation: Lives with husband.   Occupation:  Retired Visual merchandiser)    Education: Master's Degree    Tobacco Use/Exposure:  Formal Smoker    Alcohol Use:  Occasional   Drug Use:  None   Diet:  Regular   Exercise:  Walking 1 mile 5 times per week.    Hobbies:  Reading                 Outpatient Medications Prior to Visit  Medication Sig Dispense Refill  . CALCIUM-VITAMIN D PO Take 600 mg by mouth daily.     . cetirizine (ZYRTEC) 10 MG tablet Take 10 mg by mouth as needed for allergies.    . famotidine (PEPCID) 20 MG tablet TAKE 1 TABLET (20 MG TOTAL) BY MOUTH 2 (TWO) TIMES DAILY. 60 tablet 5  . fluticasone (FLONASE) 50 MCG/ACT nasal spray Place 2 sprays into the nose daily as needed.     Marland Kitchen glucose blood test strip Use as instructed--  Check glucose qd-- one touch ultra mini 100 each 12  . Multiple Vitamins-Minerals (MULTIVITAMIN PO) Take 1 tablet by mouth daily.     . Omega-3 Fatty Acids (FISH OIL PO) Take  1,200 mg by mouth daily.     . pantoprazole (PROTONIX) 40 MG tablet TAKE 1 TABLET DAILY 90 tablet 1  . simvastatin (ZOCOR) 20 MG tablet TAKE 1 TABLET DAILY AT 6 P.M. 90 tablet 0   No facility-administered medications prior to visit.     Allergies  Allergen Reactions  . Taxol [Paclitaxel] Hives and Itching  . Tramadol     "Sweaty, shaky and faint"  . Carboplatin Hives and Itching  . Codeine   . Compazine   . Compazine  [Prochlorperazine Maleate] Other (See Comments)    Severe chest pain  . Penicillins   . Prochlorperazine Edisylate     Other reaction(s): Other (See Comments) Severe chest pain    Review of Systems       Objective:    Physical Exam        Lab Results  Component Value Date   TSH 2.147 04/24/2013     Assessment & Plan:

## 2016-11-16 NOTE — Assessment & Plan Note (Signed)
Per oncology °

## 2016-11-16 NOTE — Patient Instructions (Addendum)
Carbohydrate Counting for Diabetes Mellitus, Adult Carbohydrate counting is a method for keeping track of how many carbohydrates you eat. Eating carbohydrates naturally increases the amount of sugar (glucose) in the blood. Counting how many carbohydrates you eat helps keep your blood glucose within normal limits, which helps you manage your diabetes (diabetes mellitus). It is important to know how many carbohydrates you can safely have in each meal. This is different for every person. A diet and nutrition specialist (registered dietitian) can help you make a meal plan and calculate how many carbohydrates you should have at each meal and snack. Carbohydrates are found in the following foods:  Grains, such as breads and cereals.  Dried beans and soy products.  Starchy vegetables, such as potatoes, peas, and corn.  Fruit and fruit juices.  Milk and yogurt.  Sweets and snack foods, such as cake, cookies, candy, chips, and soft drinks. How do I count carbohydrates? There are two ways to count carbohydrates in food. You can use either of the methods or a combination of both. Reading "Nutrition Facts" on packaged food  The "Nutrition Facts" list is included on the labels of almost all packaged foods and beverages in the [place] It includes:  The serving size.  Information about nutrients in each serving, including the grams (g) of carbohydrate per serving. To use the "Nutrition Facts":  Decide how many servings you will have.  Multiply the number of servings by the number of carbohydrates per serving.  The resulting number is the total amount of carbohydrates that you will be having. Learning standard serving sizes of other foods  When you eat foods containing carbohydrates that are not packaged or do not include "Nutrition Facts" on the label, you need to measure the servings in order to count the amount of carbohydrates:  Measure the foods that you will eat with a food scale or measuring  cup, if needed.  Decide how many standard-size servings you will eat.  Multiply the number of servings by 15. Most carbohydrate-rich foods have about 15 g of carbohydrates per serving.  For example, if you eat 8 oz (170 g) of strawberries, you will have eaten 2 servings and 30 g of carbohydrates (2 servings x 15 g = 30 g).  For foods that have more than one food mixed, such as soups and casseroles, you must count the carbohydrates in each food that is included. The following list contains standard serving sizes of common carbohydrate-rich foods. Each of these servings has about 15 g of carbohydrates:   hamburger bun or  English muffin.   oz (15 mL) syrup.   oz (14 g) jelly.  1 slice of bread.  1 six-inch tortilla.  3 oz (85 g) cooked rice or pasta.  4 oz (113 g) cooked dried beans.  4 oz (113 g) starchy vegetable, such as peas, corn, or potatoes.  4 oz (113 g) hot cereal.  4 oz (113 g) mashed potatoes or  of a large baked potato.  4 oz (113 g) canned or frozen fruit.  4 oz (120 mL) fruit juice.  4-6 crackers.  6 chicken nuggets.  6 oz (170 g) unsweetened dry cereal.  6 oz (170 g) plain fat-free yogurt or yogurt sweetened with artificial sweeteners.  8 oz (240 mL) milk.  8 oz (170 g) fresh fruit or one small piece of fruit.  24 oz (680 g) popped popcorn. Example of carbohydrate counting Sample meal  3 oz (85 g) chicken breast.  6 oz (  170 g) brown rice.  4 oz (113 g) corn.  8 oz (240 mL) milk.  8 oz (170 g) strawberries with sugar-free whipped topping. Carbohydrate calculation 1. Identify the foods that contain carbohydrates:  Rice.  Corn.  Milk.  Strawberries. 2. Calculate how many servings you have of each food:  2 servings rice.  1 serving corn.  1 serving milk.  1 serving strawberries. 3. Multiply each number of servings by 15 g:  2 servings rice x 15 g = 30 g.  1 serving corn x 15 g = 15 g.  1 serving milk x 15 g = 15  g.  1 serving strawberries x 15 g = 15 g. 4. Add together all of the amounts to find the total grams of carbohydrates eaten:  30 g + 15 g + 15 g + 15 g = 75 g of carbohydrates total. This information is not intended to replace advice given to you by your health care provider. Make sure you discuss any questions you have with your health care provider. Document Released: 10/19/2005 Document Revised: 05/08/2016 Document Reviewed: 04/01/2016 Elsevier Interactive Patient Education  2017 Elsevier Inc.    Pneumococcal Conjugate Vaccine (PCV13) What You Need to Know 1. Why get vaccinated? Vaccination can protect both children and adults from pneumococcal disease. Pneumococcal disease is caused by bacteria that can spread from person to person through close contact. It can cause ear infections, and it can also lead to more serious infections of the:  Lungs (pneumonia),  Blood (bacteremia), and  Covering of the brain and spinal cord (meningitis). Pneumococcal pneumonia is most common among adults. Pneumococcal meningitis can cause deafness and brain damage, and it kills about 1 child in 10 who get it. Anyone can get pneumococcal disease, but children under 64 years of age and adults 13 years and older, people with certain medical conditions, and cigarette smokers are at the highest risk. Before there was a vaccine, the [place] saw:  more than 700 cases of meningitis,  about 13,000 blood infections,  about 5 million ear infections, and  about 200 deaths in children under 5 each year from pneumococcal disease. Since vaccine became available, severe pneumococcal disease in these children has fallen by 88%. About 18,000 older adults die of pneumococcal disease each year in the [place]. Treatment of pneumococcal infections with penicillin and other drugs is not as effective as it used to be, because some strains of the disease have become resistant to these drugs. This makes  prevention of the disease, through vaccination, even more important. 2. PCV13 vaccine Pneumococcal conjugate vaccine (called PCV13) protects against 13 types of pneumococcal bacteria. PCV13 is routinely given to children at 2, 4, 6, and 70-53 months of age. It is also recommended for children and adults 34 to 63 years of age with certain health conditions, and for all adults 32 years of age and older. Your doctor can give you details. 3. Some people should not get this vaccine Anyone who has ever had a life-threatening allergic reaction to a dose of this vaccine, to an earlier pneumococcal vaccine called PCV7, or to any vaccine containing diphtheria toxoid (for example, DTaP), should not get PCV13. Anyone with a severe allergy to any component of PCV13 should not get the vaccine. Tell your doctor if the person being vaccinated has any severe allergies. If the person scheduled for vaccination is not feeling well, your healthcare provider might decide to reschedule the shot on another day. 4. Risks of  a vaccine reaction With any medicine, including vaccines, there is a chance of reactions. These are usually mild and go away on their own, but serious reactions are also possible. Problems reported following PCV13 varied by age and dose in the series. The most common problems reported among children were:  About half became drowsy after the shot, had a temporary loss of appetite, or had redness or tenderness where the shot was given.  About 1 out of 3 had swelling where the shot was given.  About 1 out of 3 had a mild fever, and about 1 in 20 had a fever over 102.56F.  Up to about 8 out of 10 became fussy or irritable. Adults have reported pain, redness, and swelling where the shot was given; also mild fever, fatigue, headache, chills, or muscle pain. Young children who get PCV13 along with inactivated flu vaccine at the same time may be at increased risk for seizures caused by fever. Ask your doctor  for more information. Problems that could happen after any vaccine:  People sometimes faint after a medical procedure, including vaccination. Sitting or lying down for about 15 minutes can help prevent fainting, and injuries caused by a fall. Tell your doctor if you feel dizzy, or have vision changes or ringing in the ears.  Some older children and adults get severe pain in the shoulder and have difficulty moving the arm where a shot was given. This happens very rarely.  Any medication can cause a severe allergic reaction. Such reactions from a vaccine are very rare, estimated at about 1 in a million doses, and would happen within a few minutes to a few hours after the vaccination. As with any medicine, there is a very small chance of a vaccine causing a serious injury or death. The safety of vaccines is always being monitored. For more information, visit: http://www.aguilar.org/ 5. What if there is a serious reaction? What should I look for? Look for anything that concerns you, such as signs of a severe allergic reaction, very high fever, or unusual behavior. Signs of a severe allergic reaction can include hives, swelling of the face and throat, difficulty breathing, a fast heartbeat, dizziness, and weakness-usually within a few minutes to a few hours after the vaccination. What should I do?  If you think it is a severe allergic reaction or other emergency that can't wait, call 9-1-1 or get the person to the nearest hospital. Otherwise, call your doctor.  Reactions should be reported to the Vaccine Adverse Event Reporting System (VAERS). Your doctor should file this report, or you can do it yourself through the VAERS web site at www.vaers.SamedayNews.es, or by calling [phone].  VAERS does not give medical advice. 6. The National Vaccine Injury Compensation Program The [employer] Vaccine Injury Compensation Program (VICP) is a federal program that was created to compensate people who may have  been injured by certain vaccines. Persons who believe they may have been injured by a vaccine can learn about the program and about filing a claim by calling 639-021-3117 or visiting the [place] website at GoldCloset.com.ee. There is a time limit to file a claim for compensation. 7. How can I learn more?  Ask your healthcare provider. He or she can give you the vaccine package insert or suggest other sources of information.  Call your local or state health department.  Contact the Centers for Disease Control and Prevention (CDC):  Call 684-574-7701 (1-800-CDC-INFO) or  Visit CDC's website at http://hunter.com/ Vaccine Information Statement, PCV13 Vaccine (  09/06/2014) This information is not intended to replace advice given to you by your health care provider. Make sure you discuss any questions you have with your health care provider. Document Released: 08/16/2006 Document Revised: 07/09/2016 Document Reviewed: 07/09/2016 Elsevier Interactive Patient Education  2017 [employer].

## 2016-11-24 ENCOUNTER — Telehealth: Payer: Self-pay | Admitting: Oncology

## 2016-11-24 NOTE — Telephone Encounter (Signed)
Patient called to reschedule appointment from 12/01/16 to 12/11/16.

## 2016-12-01 ENCOUNTER — Ambulatory Visit: Payer: BLUE CROSS/BLUE SHIELD | Admitting: Oncology

## 2016-12-03 DIAGNOSIS — K439 Ventral hernia without obstruction or gangrene: Secondary | ICD-10-CM | POA: Diagnosis not present

## 2016-12-03 DIAGNOSIS — Z08 Encounter for follow-up examination after completed treatment for malignant neoplasm: Secondary | ICD-10-CM | POA: Diagnosis not present

## 2016-12-03 DIAGNOSIS — R918 Other nonspecific abnormal finding of lung field: Secondary | ICD-10-CM | POA: Diagnosis not present

## 2016-12-03 DIAGNOSIS — Z87891 Personal history of nicotine dependence: Secondary | ICD-10-CM | POA: Diagnosis not present

## 2016-12-03 DIAGNOSIS — Z8501 Personal history of malignant neoplasm of esophagus: Secondary | ICD-10-CM | POA: Diagnosis not present

## 2016-12-03 DIAGNOSIS — Z9049 Acquired absence of other specified parts of digestive tract: Secondary | ICD-10-CM | POA: Diagnosis not present

## 2016-12-03 DIAGNOSIS — C159 Malignant neoplasm of esophagus, unspecified: Secondary | ICD-10-CM | POA: Diagnosis not present

## 2016-12-07 ENCOUNTER — Telehealth: Payer: Self-pay | Admitting: *Deleted

## 2016-12-07 NOTE — Telephone Encounter (Addendum)
Call from pt reporting there was thickening at the esophageal anastomosis on CT. Report received via fax. She has been scheduled for endoscopy on 2/13. Pt requested CD to be mailed to office as well. Dr. Benay Spice made aware.

## 2016-12-11 ENCOUNTER — Telehealth: Payer: Self-pay | Admitting: Oncology

## 2016-12-11 ENCOUNTER — Ambulatory Visit (HOSPITAL_BASED_OUTPATIENT_CLINIC_OR_DEPARTMENT_OTHER): Payer: Medicare Other | Admitting: Oncology

## 2016-12-11 VITALS — BP 143/95 | HR 90 | Temp 97.9°F | Resp 19 | Wt 122.4 lb

## 2016-12-11 DIAGNOSIS — C155 Malignant neoplasm of lower third of esophagus: Secondary | ICD-10-CM

## 2016-12-11 DIAGNOSIS — C159 Malignant neoplasm of esophagus, unspecified: Secondary | ICD-10-CM

## 2016-12-11 NOTE — Telephone Encounter (Signed)
Appointments scheduled per 2/9 LOS. Patient given AVS report and calendars with future scheduled appointments.  °

## 2016-12-11 NOTE — Progress Notes (Signed)
[place] OFFICE PROGRESS NOTE   Diagnosis: Esophagus cancer  INTERVAL HISTORY:   Briana Craig returns as scheduled. She feels well. Good appetite. No dysphagia. She continues to have early satiety and has to eat smaller amounts. She has intermittent reflux symptoms. She saw Dr. Elenor Quinones 12/03/2016. A restaging chest CT revealed a new 6 mm nodule in the right lower lobe. Multiffocal bilateral areas of centrilobular nodularity were noted. Focal soft tissue thickening was noted near the anastomosis of the gastric pull-through. An abdominal CT revealed no evidence of metastatic disease.  She is scheduled for an endoscopy with Dr. Elenor Quinones next week. She will be scheduled for a chest CT in 3 months. Objective:  Vital signs in last 24 hours:  Blood pressure (!) 143/95, pulse 90, temperature 97.9 F (36.6 C), temperature source Oral, resp. rate 19, weight 122 lb 6.4 oz (55.5 kg), SpO2 96 %.    HEENT: Neck without mass Lymphatics: No cervical, supraclavicular, axillary, or inguinal nodes Resp: Lungs clear bilaterally, no respiratory distress Cardio: Regular rate and rhythm GI: No hepatomegaly, no mass, nontender Vascular: No leg edema   Lab Results:  Lab Results  Component Value Date   WBC 5.9 11/25/2015   HGB 13.1 11/25/2015   HCT 40.0 11/25/2015   MCV 90.9 11/25/2015   PLT 239.0 11/25/2015   NEUTROABS 4.0 11/25/2015     Medications: I have reviewed the patient's current medications.  Assessment/Plan: 1. 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 A staging CT and PET scan revealed no evidence for distant metastatic disease. An endoscopic ultrasound on 01/30/2011 confirmed a uT3, uN1 tumor. She began radiation and concurrent weekly Taxol/carboplatin chemotherapy on 02/16/2011. She completed the course of radiation on 03/25/2011. The last chemotherapy was given 03/16/2011. She is status post an esophagogastrectomy on 05/14/2011 by Dr. Elenor Quinones  at [facility]. Per Dr. Florentina Jenny office note dated 07/02/2011, pathology showed a 0.7 cm adenocarcinoma with signet ring cell features, high grade. All lymph nodes negative and margins negative (T3 N0). Restaging CT 10/20/2011 at [facility] showed no evidence of recurrent esophagus cancer. CT at [facility] on 04/07/2012 revealed evidence of aspiration/inflammation with a 0.9 cm nodular opacity in the lingula. Restaging chest CT at [facility] on 10/06/2012 showed no evidence of locally recurrent or metastatic disease. Restaging CT at [facility] on 10/18/2014 without evidence of recurrent esophagus cancer.  Chest CT at [facility] 04/26/2014 with stable treated in bud opacities, 2 new nodules  Chest CT at [facility] 12/03/2016-persistent tree in bud nodularity bilaterally suggestive of aspiration, new 6 mm right lower lobe nodule, increased wall thickening at the gastric pull-through anastomosis 2. History of iron-deficiency anemia secondary to gastrointestinal blood loss from the esophagus cancer. 3. History of solid dysphagia, improved following neoadjuvant therapy.  4. Odynophagia secondary to the esophagus tumor, improved following neoadjuvant therapy. 5. History of hypertension. 6. Right ovarian cyst. 7. Skin rash following chemotherapy with week number 4 and week number 5 of Taxol/carboplatin. We felt the skin rash was most likely related to carboplatin though the rash could have also been due to Taxol. Both Taxol and carboplatin are now listed as allergies. 8. History of thrombocytopenia secondary to chemotherapy and radiation. 9. History of subcutaneous emphysema at the upper chest bilaterally following esophagogastrectomy. 10. Left vocal cord paralysis and hoarseness status post radius vocal fold injections by Dr. Redmond Baseman 08/17/2011. Improved. 11. CT 10/20/2011 at [facility] with pulmonary parenchymal opacities concerning for aspiration. Increased on a [facility] CT 04/07/2012, stable 04/20/2013 12. EGD on 11/17/2011. The GE  anastomosis very  proximal; significant amount of stomach in the chest; visible sutures, staples at the rim like anastomosis. 13. GERD. She continues Protonix and Pepcid.     Disposition:  [name] remains in clinical remission from esophagus cancer. The CT findings are likely benign. She is scheduled to undergo an upper endoscopy next week and a repeat chest CT at a three-month interval. She will contact us if there is evidence of recurrent cancer on the endoscopy or repeat CT. She will return for an office visit here in 6 months. I am available to see her sooner as needed.  15 minutes were spent with the patient today. The majority of the time was used for counseling and coordination of care.  Betsy Coder, MD  12/11/2016  8:43 AM

## 2016-12-15 DIAGNOSIS — Z87891 Personal history of nicotine dependence: Secondary | ICD-10-CM | POA: Diagnosis not present

## 2016-12-15 DIAGNOSIS — Z9889 Other specified postprocedural states: Secondary | ICD-10-CM | POA: Diagnosis not present

## 2016-12-15 DIAGNOSIS — R933 Abnormal findings on diagnostic imaging of other parts of digestive tract: Secondary | ICD-10-CM | POA: Diagnosis not present

## 2016-12-15 DIAGNOSIS — K439 Ventral hernia without obstruction or gangrene: Secondary | ICD-10-CM | POA: Diagnosis not present

## 2016-12-15 DIAGNOSIS — Z923 Personal history of irradiation: Secondary | ICD-10-CM | POA: Diagnosis not present

## 2016-12-15 DIAGNOSIS — Z9221 Personal history of antineoplastic chemotherapy: Secondary | ICD-10-CM | POA: Diagnosis not present

## 2016-12-15 DIAGNOSIS — C159 Malignant neoplasm of esophagus, unspecified: Secondary | ICD-10-CM | POA: Diagnosis not present

## 2016-12-15 DIAGNOSIS — Z08 Encounter for follow-up examination after completed treatment for malignant neoplasm: Secondary | ICD-10-CM | POA: Diagnosis not present

## 2016-12-15 DIAGNOSIS — Z880 Allergy status to penicillin: Secondary | ICD-10-CM | POA: Diagnosis not present

## 2016-12-15 DIAGNOSIS — Z8501 Personal history of malignant neoplasm of esophagus: Secondary | ICD-10-CM | POA: Diagnosis not present

## 2016-12-15 DIAGNOSIS — R918 Other nonspecific abnormal finding of lung field: Secondary | ICD-10-CM | POA: Diagnosis not present

## 2016-12-19 ENCOUNTER — Other Ambulatory Visit: Payer: Self-pay | Admitting: Family Medicine

## 2016-12-19 DIAGNOSIS — IMO0002 Reserved for concepts with insufficient information to code with codable children: Secondary | ICD-10-CM

## 2016-12-19 DIAGNOSIS — E1165 Type 2 diabetes mellitus with hyperglycemia: Secondary | ICD-10-CM

## 2016-12-22 DIAGNOSIS — H353132 Nonexudative age-related macular degeneration, bilateral, intermediate dry stage: Secondary | ICD-10-CM | POA: Diagnosis not present

## 2016-12-22 DIAGNOSIS — H40023 Open angle with borderline findings, high risk, bilateral: Secondary | ICD-10-CM | POA: Diagnosis not present

## 2017-01-07 ENCOUNTER — Other Ambulatory Visit: Payer: Self-pay | Admitting: *Deleted

## 2017-01-07 NOTE — Telephone Encounter (Addendum)
Message received from pt, but patient's name and DOB were the only things heard on message.  Call placed back to patient and patient wanted to inform Dr. Benay Spice that her biopsy results showed no signs of cancer from her endoscopy done on 12/15/16 and would like to know Dr. Gearldine Shown thoughts on her CT scan done at [facility] on 12/03/16.  Dr. Benay Spice informed.

## 2017-01-11 ENCOUNTER — Telehealth: Payer: Self-pay | Admitting: *Deleted

## 2017-01-11 NOTE — Telephone Encounter (Signed)
Pt called requesting Dr. Gearldine Shown thoughts on her endoscopy procedure done at [facility].  Returned call, Dr. Benay Spice was able to review report: looks good, pathology is negative. Pt voiced appreciation for call.

## 2017-02-05 ENCOUNTER — Other Ambulatory Visit: Payer: Self-pay | Admitting: Family Medicine

## 2017-02-05 DIAGNOSIS — E785 Hyperlipidemia, unspecified: Secondary | ICD-10-CM

## 2017-03-04 DIAGNOSIS — Z9049 Acquired absence of other specified parts of digestive tract: Secondary | ICD-10-CM | POA: Diagnosis not present

## 2017-03-04 DIAGNOSIS — Z8501 Personal history of malignant neoplasm of esophagus: Secondary | ICD-10-CM | POA: Diagnosis not present

## 2017-03-04 DIAGNOSIS — Z903 Acquired absence of stomach [part of]: Secondary | ICD-10-CM | POA: Diagnosis not present

## 2017-03-04 DIAGNOSIS — Z08 Encounter for follow-up examination after completed treatment for malignant neoplasm: Secondary | ICD-10-CM | POA: Diagnosis not present

## 2017-03-04 DIAGNOSIS — C159 Malignant neoplasm of esophagus, unspecified: Secondary | ICD-10-CM | POA: Diagnosis not present

## 2017-03-04 DIAGNOSIS — R918 Other nonspecific abnormal finding of lung field: Secondary | ICD-10-CM | POA: Diagnosis not present

## 2017-03-04 DIAGNOSIS — Z87891 Personal history of nicotine dependence: Secondary | ICD-10-CM | POA: Diagnosis not present

## 2017-03-23 ENCOUNTER — Telehealth: Payer: Self-pay | Admitting: *Deleted

## 2017-03-23 NOTE — Telephone Encounter (Signed)
Patient declines to schedule AWV w/ health coach at time of call.  She would like a call back towards the end of June to schedule.

## 2017-04-29 ENCOUNTER — Other Ambulatory Visit: Payer: Self-pay | Admitting: Family Medicine

## 2017-04-29 DIAGNOSIS — C159 Malignant neoplasm of esophagus, unspecified: Secondary | ICD-10-CM

## 2017-05-17 ENCOUNTER — Encounter: Payer: Self-pay | Admitting: Family Medicine

## 2017-05-17 ENCOUNTER — Ambulatory Visit (INDEPENDENT_AMBULATORY_CARE_PROVIDER_SITE_OTHER): Payer: Medicare Other | Admitting: Family Medicine

## 2017-05-17 VITALS — BP 112/78 | HR 82 | Temp 97.7°F | Resp 16 | Ht 63.0 in | Wt 123.0 lb

## 2017-05-17 DIAGNOSIS — E785 Hyperlipidemia, unspecified: Secondary | ICD-10-CM | POA: Diagnosis not present

## 2017-05-17 DIAGNOSIS — I10 Essential (primary) hypertension: Secondary | ICD-10-CM

## 2017-05-17 DIAGNOSIS — E119 Type 2 diabetes mellitus without complications: Secondary | ICD-10-CM

## 2017-05-17 LAB — COMPREHENSIVE METABOLIC PANEL
ALT: 15 U/L (ref 0–35)
AST: 20 U/L (ref 0–37)
Albumin: 4 g/dL (ref 3.5–5.2)
Alkaline Phosphatase: 45 U/L (ref 39–117)
BUN: 23 mg/dL (ref 6–23)
CO2: 28 mEq/L (ref 19–32)
Calcium: 9.7 mg/dL (ref 8.4–10.5)
Chloride: 104 mEq/L (ref 96–112)
Creatinine, Ser: 1.05 mg/dL (ref 0.40–1.20)
GFR: 54.66 mL/min — ABNORMAL LOW (ref >60.00)
Glucose, Bld: 125 mg/dL — ABNORMAL HIGH (ref 70–99)
Potassium: 4.6 mEq/L (ref 3.5–5.1)
Sodium: 139 mEq/L (ref 135–145)
Total Bilirubin: 0.5 mg/dL (ref 0.2–1.2)
Total Protein: 7.1 g/dL (ref 6.0–8.3)

## 2017-05-17 LAB — LIPID PANEL
Cholesterol: 181 mg/dL (ref 0–200)
HDL: 85.7 mg/dL (ref >39.00)
LDL Cholesterol: 80 mg/dL (ref 0–99)
NonHDL: 94.8
Total CHOL/HDL Ratio: 2
Triglycerides: 75 mg/dL (ref 0.0–149.0)
VLDL: 15 mg/dL (ref 0.0–40.0)

## 2017-05-17 LAB — HEMOGLOBIN A1C: Hgb A1c MFr Bld: 6.9 % — ABNORMAL HIGH (ref 4.6–6.5)

## 2017-05-17 LAB — MICROALBUMIN / CREATININE URINE RATIO
Creatinine,U: 89.9 mg/dL
Microalb Creat Ratio: 0.8 mg/g (ref 0.0–30.0)
Microalb, Ur: <0.7 mg/dL (ref 0.0–1.9)

## 2017-05-17 NOTE — Patient Instructions (Signed)

## 2017-05-17 NOTE — Progress Notes (Signed)
Patient ID: Briana Craig, female   DOB: Jan 16, 1946, 71 y.o.   MRN: [id number]     Subjective:  I acted as a Education administrator for Dr. Carollee Herter.  Guerry Bruin, [place]   Patient ID: Briana Craig, female    DOB: [birth year], 71 y.o.   MRN: [id number]  Chief Complaint  Patient presents with  . Hypertension  . Hyperlipidemia  . Diabetes    HPI Patient is in today for follow up blood pressure, diabetes, and cholesterol.  She checks her sugars occasionally and she states that her sugars have been doing good.  She has been doing well on current treatment on cholesterol medication with no complaints.  Her scans were all normal from January at [facility] and she goes back in a year.  She had a full body scan and chest ct -- they were good.    Past Medical History:  Diagnosis Date  . Dysphagia   . Esophageal cancer ([place])   . GERD (gastroesophageal reflux disease)   . Hyperlipemia   . Hypertension   . Impaired fasting blood sugar   . PONV (postoperative nausea and vomiting)     Past Surgical History:  Procedure Laterality Date  . DIRECT LARYNGOSCOPY WITH BOTOX INJECTION     tx paralysed vocal cord  . ESOPHAGOGASTRODUODENOSCOPY  11/17/2011   Procedure: ESOPHAGOGASTRODUODENOSCOPY (EGD);  Surgeon: Owens Loffler, MD;  Location: [facility];  Service: Endoscopy;  Laterality: N/A;  . ESOPHAGUS SURGERY    . EYE SURGERY     both cataracts  . GANGLION CYST EXCISION Left 01/26/2013   Procedure: LEFT FLEXOR CARPI RADIALIS  RELEASE AND SCAPHO TRAPEZIAL TRAPEZOID DEBRIDEMENT ;  Surgeon: Tennis Must, MD;  Location: [place];  Service: Orthopedics;  Laterality: Left;    Family History  Problem Relation Age of Onset  . Colon cancer Neg Hx   . Alcohol abuse Sister   . AAA (abdominal aortic aneurysm) Mother   . COPD Father     Social History   Social History  . Marital status: Married    Spouse name: Sonia Side  . Number of children: 0  . Years of education: 15   Occupational History  .  RETIRED  Other   Social History Main Topics  . Smoking status: Former Smoker    Types: Cigarettes    Quit date: 08/30/1987  . Smokeless tobacco: Never Used  . Alcohol use No  . Drug use: Unknown  . Sexual activity: Yes    Partners: Male    Birth control/ protection: Post-menopausal   Other Topics Concern  . Not on file   Social History Narrative   Marital Status:  Married Sonia Side)   Children:  None    Pets: Dog (1)    Living Situation: Lives with husband.   Occupation:  Retired Visual merchandiser)    Education: Master's Degree    Tobacco Use/Exposure:  Formal Smoker    Alcohol Use:  Occasional   Drug Use:  None   Diet:  Regular   Exercise:  Walking 1 mile 5 times per week.    Hobbies:  Reading                 Outpatient Medications Prior to Visit  Medication Sig Dispense Refill  . CALCIUM-VITAMIN D PO Take 600 mg by mouth daily.     . cetirizine (ZYRTEC) 10 MG tablet Take 10 mg by mouth as needed for allergies.    . famotidine (PEPCID) 20 MG tablet TAKE 1 TABLET (  20 MG TOTAL) BY MOUTH 2 (TWO) TIMES DAILY. 60 tablet 5  . fluticasone (FLONASE) 50 MCG/ACT nasal spray Place 2 sprays into the nose daily as needed.     Marland Kitchen glucose blood test strip Use as instructed--  Check glucose qd-- one touch ultra mini 100 each 1  . Lancets MISC Use as directed once a day. DX code E11.9 100 each 1  . Multiple Vitamins-Minerals (MULTIVITAMIN PO) Take 1 tablet by mouth daily.     . Omega-3 Fatty Acids (FISH OIL PO) Take 1,200 mg by mouth daily.     . ONE TOUCH ULTRA TEST test strip USE TO CHECK BLOOD SUGAR ONCE A DAY 100 each 3  . pantoprazole (PROTONIX) 40 MG tablet TAKE 1 TABLET DAILY 90 tablet 1  . simvastatin (ZOCOR) 20 MG tablet TAKE 1 TABLET DAILY AT 6 P.M. 90 tablet 0  . simvastatin (ZOCOR) 20 MG tablet TAKE 1 TABLET DAILY AT 6 P.M. 90 tablet 1   No facility-administered medications prior to visit.     Allergies  Allergen Reactions  . Taxol [Paclitaxel] Hives and Itching  . Tramadol      "Sweaty, shaky and faint"  . Carboplatin Hives and Itching  . Codeine   . Compazine   . Compazine  [Prochlorperazine Maleate] Other (See Comments)    Severe chest pain  . Penicillins   . Prochlorperazine Edisylate     Other reaction(s): Other (See Comments) Severe chest pain    Review of Systems  Constitutional: Negative for chills, fever and malaise/fatigue.  HENT: Negative for congestion and hearing loss.   Eyes: Negative for discharge.  Respiratory: Negative for cough, sputum production and shortness of breath.   Cardiovascular: Negative for chest pain, palpitations and leg swelling.  Gastrointestinal: Negative for abdominal pain, blood in stool, constipation, diarrhea, heartburn, nausea and vomiting.  Genitourinary: Negative for dysuria, frequency, hematuria and urgency.  Musculoskeletal: Negative for back pain, falls and myalgias.  Skin: Negative for rash.  Neurological: Negative for dizziness, sensory change, loss of consciousness, weakness and headaches.  Endo/Heme/Allergies: Negative for environmental allergies. Does not bruise/bleed easily.  Psychiatric/Behavioral: Negative for depression and suicidal ideas. The patient is not nervous/anxious and does not have insomnia.        Objective:    Physical Exam  Constitutional: She is oriented to person, place, and time. She appears well-developed and well-nourished.  HENT:  Head: Normocephalic and atraumatic.  Eyes: Conjunctivae and EOM are normal.  Neck: Normal range of motion. Neck supple. No JVD present. Carotid bruit is not present. No thyromegaly present.  Cardiovascular: Normal rate, regular rhythm and normal heart sounds.   No murmur heard. Pulmonary/Chest: Effort normal and breath sounds normal. No respiratory distress. She has no wheezes. She has no rales. She exhibits no tenderness.  Musculoskeletal: She exhibits no edema.  Neurological: She is alert and oriented to person, place, and time.  Psychiatric: She has  a normal mood and affect. Her behavior is normal. Judgment and thought content normal.  Nursing note and vitals reviewed.   BP 112/78 (BP Location: Left Arm, Cuff Size: Normal)   Pulse 82   Temp 97.7 F (36.5 C) (Oral)   Resp 16   Ht 5\' 3"  (1.6 m)   Wt 123 lb (55.8 kg)   SpO2 98%   BMI 21.79 kg/m  Wt Readings from Last 3 Encounters:  05/17/17 123 lb (55.8 kg)  12/11/16 122 lb 6.4 oz (55.5 kg)  11/16/16 123 lb 6.4 oz (56 kg)  Lab Results  Component Value Date   WBC 5.9 11/25/2015   HGB 13.1 11/25/2015   HCT 40.0 11/25/2015   PLT 239.0 11/25/2015   GLUCOSE 117 (H) 11/16/2016   CHOL 189 11/16/2016   TRIG 63.0 11/16/2016   HDL 92.00 11/16/2016   LDLCALC 84 11/16/2016   ALT 15 11/16/2016   AST 20 11/16/2016   NA 138 11/16/2016   K 4.9 11/16/2016   CL 104 11/16/2016   CREATININE 1.06 11/16/2016   BUN 24 (H) 11/16/2016   CO2 29 11/16/2016   TSH 2.147 04/24/2013   HGBA1C 6.8 (H) 11/16/2016   MICROALBUR <0.7 11/25/2015    Lab Results  Component Value Date   TSH 2.147 04/24/2013   Lab Results  Component Value Date   WBC 5.9 11/25/2015   HGB 13.1 11/25/2015   HCT 40.0 11/25/2015   MCV 90.9 11/25/2015   PLT 239.0 11/25/2015   Lab Results  Component Value Date   NA 138 11/16/2016   K 4.9 11/16/2016   CO2 29 11/16/2016   GLUCOSE 117 (H) 11/16/2016   BUN 24 (H) 11/16/2016   CREATININE 1.06 11/16/2016   BILITOT 0.6 11/16/2016   ALKPHOS 41 11/16/2016   AST 20 11/16/2016   ALT 15 11/16/2016   PROT 7.4 11/16/2016   ALBUMIN 4.1 11/16/2016   CALCIUM 9.9 11/16/2016   GFR 54.14 (L) 11/16/2016   Lab Results  Component Value Date   CHOL 189 11/16/2016   Lab Results  Component Value Date   HDL 92.00 11/16/2016   Lab Results  Component Value Date   LDLCALC 84 11/16/2016   Lab Results  Component Value Date   TRIG 63.0 11/16/2016   Lab Results  Component Value Date   CHOLHDL 2 11/16/2016   Lab Results  Component Value Date   HGBA1C 6.8 (H)  11/16/2016       Assessment & Plan:   Problem List Items Addressed This Visit      Unprioritized   Hyperlipidemia    Tolerating statin, encouraged heart healthy diet, avoid trans fats, minimize simple carbs and saturated fats. Increase exercise as tolerated      Relevant Orders   Lipid panel   Type II diabetes mellitus ([place]) - Primary    hgba1c to be done, minimize simple carbs. Increase exercise as tolerated. Continue current meds      Relevant Orders   Hemoglobin A1c   Lipid panel    Other Visit Diagnoses    hyperlipidemia       Relevant Orders   Comprehensive metabolic panel   Microalbumin / creatinine urine ratio      I am having Ms. Righi maintain her CALCIUM-VITAMIN D PO, Multiple Vitamins-Minerals (MULTIVITAMIN PO), Omega-3 Fatty Acids (FISH OIL PO), cetirizine, fluticasone, famotidine, simvastatin, glucose blood, Lancets, ONE TOUCH ULTRA TEST, pantoprazole, and PRESERVISION AREDS.  Meds ordered this encounter  Medications  . Multiple Vitamins-Minerals (PRESERVISION AREDS) CAPS    Sig: Take by mouth.    CMA served as Education administrator during this visit. History, Physical and Plan performed by medical provider. Documentation and orders reviewed and attested to.  Ann Held, DO

## 2017-05-17 NOTE — Assessment & Plan Note (Signed)
hgba1c to be done, minimize simple carbs. Increase exercise as tolerated. Continue current meds  

## 2017-05-17 NOTE — Assessment & Plan Note (Signed)
Tolerating statin, encouraged heart healthy diet, avoid trans fats, minimize simple carbs and saturated fats. Increase exercise as tolerated 

## 2017-05-24 ENCOUNTER — Other Ambulatory Visit: Payer: Self-pay

## 2017-05-24 DIAGNOSIS — E119 Type 2 diabetes mellitus without complications: Secondary | ICD-10-CM

## 2017-05-24 DIAGNOSIS — E785 Hyperlipidemia, unspecified: Secondary | ICD-10-CM

## 2017-06-10 ENCOUNTER — Ambulatory Visit (HOSPITAL_BASED_OUTPATIENT_CLINIC_OR_DEPARTMENT_OTHER): Payer: Medicare Other | Admitting: Oncology

## 2017-06-10 VITALS — BP 127/86 | HR 85 | Temp 98.1°F | Resp 19 | Ht 63.0 in | Wt 123.9 lb

## 2017-06-10 DIAGNOSIS — Z8501 Personal history of malignant neoplasm of esophagus: Secondary | ICD-10-CM

## 2017-06-10 DIAGNOSIS — C159 Malignant neoplasm of esophagus, unspecified: Secondary | ICD-10-CM

## 2017-06-10 DIAGNOSIS — I10 Essential (primary) hypertension: Secondary | ICD-10-CM

## 2017-06-10 NOTE — Progress Notes (Signed)
[place] OFFICE PROGRESS NOTE   Diagnosis: Esophagus cancer  INTERVAL HISTORY:   Ms. Briana Craig returns as scheduled. She saw Dr. Elenor Quinones in May. A restaging chest CT revealed no evidence of recurrent disease. He feels well. No dysphagia. No cough or dyspnea. Good appetite.  Objective:  Vital signs in last 24 hours:  Blood pressure 127/86, pulse 85, temperature 98.1 F (36.7 C), temperature source Oral, resp. rate 19, height 5\' 3"  (1.6 m), weight 123 lb 14.4 oz (56.2 kg), SpO2 95 %.    HEENT: Neck without mass Lymphatics: No cervical or supraclavicular nodes. Soft mobile 1/2 cm bilateral axillary nodes versus prominent fat pads Resp: Scattered coarse rhonchi at the posterior chest bilaterally, no respiratory distress, good air movement bilaterally Cardio: Regular rate and rhythm GI: No hepatomegaly, nontender, no mass Vascular: No leg edema   Lab Results:  Lab Results  Component Value Date   WBC 5.9 11/25/2015   HGB 13.1 11/25/2015   HCT 40.0 11/25/2015   MCV 90.9 11/25/2015   PLT 239.0 11/25/2015   NEUTROABS 4.0 11/25/2015    CMP     Component Value Date/Time   NA 139 05/17/2017 0900   K 4.6 05/17/2017 0900   CL 104 05/17/2017 0900   CO2 28 05/17/2017 0900   GLUCOSE 125 (H) 05/17/2017 0900   BUN 23 05/17/2017 0900   CREATININE 1.05 05/17/2017 0900   CREATININE 1.07 04/16/2014 0821   CALCIUM 9.7 05/17/2017 0900   PROT 7.1 05/17/2017 0900   ALBUMIN 4.0 05/17/2017 0900   AST 20 05/17/2017 0900   ALT 15 05/17/2017 0900   ALKPHOS 45 05/17/2017 0900   BILITOT 0.5 05/17/2017 0900   GFRNONAA 53 (L) 04/16/2014 0821   GFRAA 61 04/16/2014 0821    No results found for: CEA1  No results found for: INR  Imaging:  No results found.  Medications: I have reviewed the patient's current medications.  Assessment/Plan: 1. 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 A staging CT and PET scan revealed no evidence for distant  metastatic disease. An endoscopic ultrasound on 01/30/2011 confirmed a uT3, uN1 tumor. She began radiation and concurrent weekly Taxol/carboplatin chemotherapy on 02/16/2011. She completed the course of radiation on 03/25/2011. The last chemotherapy was given 03/16/2011. She is status post an esophagogastrectomy on 05/14/2011 by Dr. Elenor Quinones at [facility]. Per Dr. Florentina Jenny office note dated 07/02/2011, pathology showed a 0.7 cm adenocarcinoma with signet ring cell features, high grade. All lymph nodes negative and margins negative (T3 N0). Restaging CT 10/20/2011 at [facility] showed no evidence of recurrent esophagus cancer. CT at [facility] on 04/07/2012 revealed evidence of aspiration/inflammation with a 0.9 cm nodular opacity in the lingula. Restaging chest CT at [facility] on 10/06/2012 showed no evidence of locally recurrent or metastatic disease. Restaging CT at [facility] on 10/18/2014 without evidence of recurrent esophagus cancer.  Chest CT at [facility] 04/26/2014 with stable treated in bud opacities, 2 new nodules  Chest CT at [facility] 12/03/2016-persistent tree in bud nodularity bilaterally suggestive of aspiration, new 6 mm right lower lobe nodule, increased wall thickening at the gastric pull-through anastomosis  Chest CT at [facility] 03/04/2017-decrease in the right lower lobe nodule, stable wall thickening at the anastomosis, no evidence of recurrent disease 2. History of iron-deficiency anemia secondary to gastrointestinal blood loss from the esophagus cancer. 3. History of solid dysphagia, improved following neoadjuvant therapy.  4. Odynophagia secondary to the esophagus tumor, improved following neoadjuvant therapy. 5. History of hypertension. 6. Right ovarian cyst. 7. Skin  rash following chemotherapy with week number 4 and week number 5 of Taxol/carboplatin. We felt the skin rash was most likely related to carboplatin though the rash could have also been due to Taxol. Both Taxol and carboplatin are now listed as  allergies. 8. History of thrombocytopenia secondary to chemotherapy and radiation. 9. History of subcutaneous emphysema at the upper chest bilaterally following esophagogastrectomy. 10. Left vocal cord paralysis and hoarseness status post radius vocal fold injections by Dr. Redmond Baseman 08/17/2011. Improved. 11. CT 10/20/2011 at [facility] with pulmonary parenchymal opacities concerning for aspiration. Increased on a [facility] CT 04/07/2012, stable 04/20/2013 12. EGD on 11/17/2011. The GE anastomosis very proximal; significant amount of stomach in the chest; visible sutures, staples at the rim like anastomosis. 13. GERD. She continues Protonix and Pepcid.   Disposition:  Ms. [name] remains in clinical remission from esophagus cancer. She is now greater than 6 years out from diagnosis. She plans to see Dr. Elenor Quinones in May 2019. She was discharged from the Oncology clinic today. I am available to see her in the future as needed.  15 minutes were spent with the patient today. The majority of the time was used for counseling and coordination of care.  Donneta Romberg, MD  06/10/2017  8:18 AM

## 2017-06-16 DIAGNOSIS — H26493 Other secondary cataract, bilateral: Secondary | ICD-10-CM | POA: Diagnosis not present

## 2017-06-16 DIAGNOSIS — H353132 Nonexudative age-related macular degeneration, bilateral, intermediate dry stage: Secondary | ICD-10-CM | POA: Diagnosis not present

## 2017-06-16 DIAGNOSIS — H401131 Primary open-angle glaucoma, bilateral, mild stage: Secondary | ICD-10-CM | POA: Diagnosis not present

## 2017-07-22 DIAGNOSIS — H353132 Nonexudative age-related macular degeneration, bilateral, intermediate dry stage: Secondary | ICD-10-CM | POA: Diagnosis not present

## 2017-07-22 DIAGNOSIS — H401131 Primary open-angle glaucoma, bilateral, mild stage: Secondary | ICD-10-CM | POA: Diagnosis not present

## 2017-07-22 DIAGNOSIS — H26491 Other secondary cataract, right eye: Secondary | ICD-10-CM | POA: Diagnosis not present

## 2017-07-22 DIAGNOSIS — H524 Presbyopia: Secondary | ICD-10-CM | POA: Diagnosis not present

## 2017-08-02 DIAGNOSIS — Z23 Encounter for immunization: Secondary | ICD-10-CM | POA: Diagnosis not present

## 2017-08-05 ENCOUNTER — Other Ambulatory Visit: Payer: Self-pay | Admitting: Family Medicine

## 2017-08-05 DIAGNOSIS — E785 Hyperlipidemia, unspecified: Secondary | ICD-10-CM

## 2017-11-01 ENCOUNTER — Other Ambulatory Visit: Payer: Self-pay | Admitting: Family Medicine

## 2017-11-01 DIAGNOSIS — C159 Malignant neoplasm of esophagus, unspecified: Secondary | ICD-10-CM

## 2017-11-18 ENCOUNTER — Encounter: Payer: Self-pay | Admitting: Family Medicine

## 2017-11-18 ENCOUNTER — Ambulatory Visit (INDEPENDENT_AMBULATORY_CARE_PROVIDER_SITE_OTHER): Payer: Medicare Other | Admitting: Family Medicine

## 2017-11-18 VITALS — BP 128/80 | HR 83 | Temp 98.0°F | Resp 16 | Ht 63.0 in | Wt 125.0 lb

## 2017-11-18 DIAGNOSIS — E1165 Type 2 diabetes mellitus with hyperglycemia: Secondary | ICD-10-CM

## 2017-11-18 DIAGNOSIS — E1151 Type 2 diabetes mellitus with diabetic peripheral angiopathy without gangrene: Secondary | ICD-10-CM

## 2017-11-18 DIAGNOSIS — E785 Hyperlipidemia, unspecified: Secondary | ICD-10-CM

## 2017-11-18 DIAGNOSIS — E119 Type 2 diabetes mellitus without complications: Secondary | ICD-10-CM | POA: Diagnosis not present

## 2017-11-18 DIAGNOSIS — IMO0002 Reserved for concepts with insufficient information to code with codable children: Secondary | ICD-10-CM

## 2017-11-18 LAB — COMPREHENSIVE METABOLIC PANEL
ALT: 17 U/L (ref 0–35)
AST: 25 U/L (ref 0–37)
Albumin: 4.4 g/dL (ref 3.5–5.2)
Alkaline Phosphatase: 48 U/L (ref 39–117)
BUN: 28 mg/dL — ABNORMAL HIGH (ref 6–23)
CO2: 30 mEq/L (ref 19–32)
Calcium: 9.8 mg/dL (ref 8.4–10.5)
Chloride: 103 mEq/L (ref 96–112)
Creatinine, Ser: 1.14 mg/dL (ref 0.40–1.20)
GFR: 49.64 mL/min — ABNORMAL LOW (ref >60.00)
Glucose, Bld: 125 mg/dL — ABNORMAL HIGH (ref 70–99)
Potassium: 4.2 mEq/L (ref 3.5–5.1)
Sodium: 140 mEq/L (ref 135–145)
Total Bilirubin: 0.8 mg/dL (ref 0.2–1.2)
Total Protein: 7.4 g/dL (ref 6.0–8.3)

## 2017-11-18 LAB — LIPID PANEL
Cholesterol: 193 mg/dL (ref 0–200)
HDL: 85.4 mg/dL (ref >39.00)
LDL Cholesterol: 91 mg/dL (ref 0–99)
NonHDL: 107.11
Total CHOL/HDL Ratio: 2
Triglycerides: 80 mg/dL (ref 0.0–149.0)
VLDL: 16 mg/dL (ref 0.0–40.0)

## 2017-11-18 LAB — HEMOGLOBIN A1C: Hgb A1c MFr Bld: 6.8 % — ABNORMAL HIGH (ref 4.6–6.5)

## 2017-11-18 NOTE — Progress Notes (Signed)
Patient ID: Briana Craig, female   DOB: December 27, 1945, 72 y.o.   MRN: [id number]    Subjective:  I acted as a Education administrator for Dr. Carollee Herter.  Guerry Bruin, [place]   Patient ID: Briana Craig, female    DOB: 03/21/1946, 72 y.o.   MRN: [id number]  Chief Complaint  Patient presents with  . Hyperlipidemia  . Diabetes    HPI  Patient is in today for follow up diabetes and cholesterol.  Has been doing well with current treatment for cholesterol with no side effects. She does not check sugars daily.  DIABETES    Blood Sugar ranges-not checking   Polyuria- no New Visual problems- no  Hypoglycemic symptoms- no  Other side effects-no Medication compliance - good Last eye exam- summer 2018 Foot exam- today   HYPERLIPIDEMIA  Medication compliance- good RUQ pain- no  Muscle aches- no Other side effects-no    Patient Care Team: Ann Held, DO as PCP - General (Family Medicine) Melida Quitter, MD as Consulting Physician (Otolaryngology) Ladell Pier, MD as Consulting Physician (Oncology)   Past Medical History:  Diagnosis Date  . Dysphagia   . Esophageal cancer ([place])   . GERD (gastroesophageal reflux disease)   . Hyperlipemia   . Hypertension   . Impaired fasting blood sugar   . PONV (postoperative nausea and vomiting)     Past Surgical History:  Procedure Laterality Date  . DIRECT LARYNGOSCOPY WITH BOTOX INJECTION     tx paralysed vocal cord  . ESOPHAGOGASTRODUODENOSCOPY  11/17/2011   Procedure: ESOPHAGOGASTRODUODENOSCOPY (EGD);  Surgeon: Owens Loffler, MD;  Location: [facility];  Service: Endoscopy;  Laterality: N/A;  . ESOPHAGUS SURGERY    . EYE SURGERY     both cataracts  . GANGLION CYST EXCISION Left 01/26/2013   Procedure: LEFT FLEXOR CARPI RADIALIS  RELEASE AND SCAPHO TRAPEZIAL TRAPEZOID DEBRIDEMENT ;  Surgeon: Tennis Must, MD;  Location: [place];  Service: Orthopedics;  Laterality: Left;    Family History  Problem Relation Age of Onset    . Colon cancer Neg Hx   . Alcohol abuse Sister   . AAA (abdominal aortic aneurysm) Mother   . COPD Father     Social History   Socioeconomic History  . Marital status: Married    Spouse name: Sonia Side  . Number of children: 0  . Years of education: 42  . Highest education level: Not on file  Social Needs  . Financial resource strain: Not on file  . Food insecurity - worry: Not on file  . Food insecurity - inability: Not on file  . Transportation needs - medical: Not on file  . Transportation needs - non-medical: Not on file  Occupational History  . Occupation: RETIRED     Employer: OTHER  Tobacco Use  . Smoking status: Former Smoker    Types: Cigarettes    Last attempt to quit: 08/30/1987    Years since quitting: 30.2  . Smokeless tobacco: Never Used  Substance and Sexual Activity  . Alcohol use: No  . Drug use: Not on file  . Sexual activity: Yes    Partners: Male    Birth control/protection: Post-menopausal  Other Topics Concern  . Not on file  Social History Narrative   Marital Status:  Married Sonia Side)   Children:  None    Pets: Dog (1)    Living Situation: Lives with husband.   Occupation:  Retired Visual merchandiser)    Education: Conservator, museum/gallery  Tobacco Use/Exposure:  Formal Smoker    Alcohol Use:  Occasional   Drug Use:  None   Diet:  Regular   Exercise:  Walking 1 mile 5 times per week.    Hobbies:  Reading                 Outpatient Medications Prior to Visit  Medication Sig Dispense Refill  . CALCIUM-VITAMIN D PO Take 600 mg by mouth daily.     . cetirizine (ZYRTEC) 10 MG tablet Take 10 mg by mouth as needed for allergies.    . famotidine (PEPCID) 20 MG tablet TAKE 1 TABLET (20 MG TOTAL) BY MOUTH 2 (TWO) TIMES DAILY. 60 tablet 5  . fluticasone (FLONASE) 50 MCG/ACT nasal spray Place 2 sprays into the nose daily as needed.     Marland Kitchen glucose blood test strip Use as instructed--  Check glucose qd-- one touch ultra mini 100 each 1  . Lancets MISC Use as directed  once a day. DX code E11.9 100 each 1  . latanoprost (XALATAN) 0.005 % ophthalmic solution Place 1 drop into both eyes daily.    . Multiple Vitamins-Minerals (MULTIVITAMIN PO) Take 1 tablet by mouth daily.     . Multiple Vitamins-Minerals (PRESERVISION AREDS) CAPS Take by mouth.    . Omega-3 Fatty Acids (FISH OIL PO) Take 1,200 mg by mouth daily.     . ONE TOUCH ULTRA TEST test strip USE TO CHECK BLOOD SUGAR ONCE A DAY 100 each 3  . pantoprazole (PROTONIX) 40 MG tablet TAKE 1 TABLET DAILY 90 tablet 1  . simvastatin (ZOCOR) 20 MG tablet TAKE 1 TABLET DAILY AT 6 P.M. 90 tablet 0  . simvastatin (ZOCOR) 20 MG tablet TAKE 1 TABLET DAILY AT 6 P.M. 90 tablet 1   No facility-administered medications prior to visit.     Allergies  Allergen Reactions  . Taxol [Paclitaxel] Hives and Itching  . Tramadol     "Sweaty, shaky and faint"  . Carboplatin Hives and Itching  . Codeine   . Compazine   . Compazine  [Prochlorperazine Maleate] Other (See Comments)    Severe chest pain  . Penicillins   . Prochlorperazine Edisylate     Other reaction(s): Other (See Comments) Severe chest pain    Review of Systems  Constitutional: Negative for fever and malaise/fatigue.  HENT: Negative for congestion.   Eyes: Negative for blurred vision.  Respiratory: Negative for cough and shortness of breath.   Cardiovascular: Negative for chest pain, palpitations and leg swelling.  Gastrointestinal: Negative for vomiting.  Musculoskeletal: Negative for back pain.  Skin: Negative for rash.  Neurological: Negative for loss of consciousness and headaches.       Objective:    Physical Exam  Constitutional: She is oriented to person, place, and time. She appears well-developed and well-nourished.  HENT:  Head: Normocephalic and atraumatic.  Eyes: Conjunctivae and EOM are normal.  Neck: Normal range of motion. Neck supple. No JVD present. Carotid bruit is not present. No thyromegaly present.  Cardiovascular: Normal  rate, regular rhythm and normal heart sounds.  No murmur heard. Pulmonary/Chest: Effort normal and breath sounds normal. No respiratory distress. She has no wheezes. She has no rales. She exhibits no tenderness.  Musculoskeletal: She exhibits no edema.  Neurological: She is alert and oriented to person, place, and time.  Psychiatric: She has a normal mood and affect.  Nursing note and vitals reviewed. Sensory exam of the foot is normal, tested with the monofilament. Good  pulses, no lesions or ulcers, good peripheral pulses.  BP 128/80 (BP Location: Left Arm, Cuff Size: Normal)   Pulse 83   Temp 98 F (36.7 C) (Oral)   Resp 16   Ht 5\' 3"  (1.6 m)   Wt 125 lb (56.7 kg)   SpO2 93%   BMI 22.14 kg/m  Wt Readings from Last 3 Encounters:  11/18/17 125 lb (56.7 kg)  06/10/17 123 lb 14.4 oz (56.2 kg)  05/17/17 123 lb (55.8 kg)   BP Readings from Last 3 Encounters:  11/18/17 128/80  06/10/17 127/86  05/17/17 112/78     Immunization History  Administered Date(s) Administered  . Influenza, High Dose Seasonal PF 08/16/2014, 08/13/2015, 08/20/2016  . Influenza-Unspecified 08/13/2015  . Pneumococcal Conjugate-13 09/03/2014  . Pneumococcal Polysaccharide-23 12/03/2010, 11/16/2016  . Tdap 05/01/2013    Health Maintenance  Topic Date Due  . HEMOGLOBIN A1C  11/17/2017  . MAMMOGRAM  11/23/2019 (Originally 10/12/1962)  . URINE MICROALBUMIN  05/17/2018  . OPHTHALMOLOGY EXAM  06/18/2018  . FOOT EXAM  11/18/2018  . COLONOSCOPY  01/04/2021  . TETANUS/TDAP  05/02/2023  . INFLUENZA VACCINE  Completed  . DEXA SCAN  Completed  . Hepatitis C Screening  Completed  . PNA vac Low Risk Adult  Completed    Lab Results  Component Value Date   WBC 5.9 11/25/2015   HGB 13.1 11/25/2015   HCT 40.0 11/25/2015   PLT 239.0 11/25/2015   GLUCOSE 125 (H) 05/17/2017   CHOL 181 05/17/2017   TRIG 75.0 05/17/2017   HDL 85.70 05/17/2017   LDLCALC 80 05/17/2017   ALT 15 05/17/2017   AST 20 05/17/2017    NA 139 05/17/2017   K 4.6 05/17/2017   CL 104 05/17/2017   CREATININE 1.05 05/17/2017   BUN 23 05/17/2017   CO2 28 05/17/2017   TSH 2.147 04/24/2013   HGBA1C 6.9 (H) 05/17/2017   MICROALBUR <0.7 05/17/2017    Lab Results  Component Value Date   TSH 2.147 04/24/2013   Lab Results  Component Value Date   WBC 5.9 11/25/2015   HGB 13.1 11/25/2015   HCT 40.0 11/25/2015   MCV 90.9 11/25/2015   PLT 239.0 11/25/2015   Lab Results  Component Value Date   NA 139 05/17/2017   K 4.6 05/17/2017   CO2 28 05/17/2017   GLUCOSE 125 (H) 05/17/2017   BUN 23 05/17/2017   CREATININE 1.05 05/17/2017   BILITOT 0.5 05/17/2017   ALKPHOS 45 05/17/2017   AST 20 05/17/2017   ALT 15 05/17/2017   PROT 7.1 05/17/2017   ALBUMIN 4.0 05/17/2017   CALCIUM 9.7 05/17/2017   GFR 54.66 (L) 05/17/2017   Lab Results  Component Value Date   CHOL 181 05/17/2017   Lab Results  Component Value Date   HDL 85.70 05/17/2017   Lab Results  Component Value Date   LDLCALC 80 05/17/2017   Lab Results  Component Value Date   TRIG 75.0 05/17/2017   Lab Results  Component Value Date   CHOLHDL 2 05/17/2017   Lab Results  Component Value Date   HGBA1C 6.9 (H) 05/17/2017         Assessment & Plan:   Problem List Items Addressed This Visit      Unprioritized   Hyperlipidemia    Tolerating statin, encouraged heart healthy diet, avoid trans fats, minimize simple carbs and saturated fats. Increase exercise as tolerated      Type II diabetes mellitus ([place])    hgba1c  to be checked , minimize simple carbs. Increase exercise as tolerated. Continue current meds        Other Visit Diagnoses    Hyperlipidemia LDL goal <100    -  Primary   Relevant Orders   Comprehensive metabolic panel   Lipid panel   DM (diabetes mellitus) type II uncontrolled, periph vascular disorder ([place])       Relevant Orders   Hemoglobin A1c   Comprehensive metabolic panel      I am having Briana Craig maintain her  CALCIUM-VITAMIN D PO, Multiple Vitamins-Minerals (MULTIVITAMIN PO), Omega-3 Fatty Acids (FISH OIL PO), cetirizine, fluticasone, famotidine, simvastatin, glucose blood, Lancets, ONE TOUCH ULTRA TEST, PRESERVISION AREDS, pantoprazole, and latanoprost.  No orders of the defined types were placed in this encounter.   CMA served as Education administrator during this visit. History, Physical and Plan performed by medical provider. Documentation and orders reviewed and attested to.  Ann Held, DO

## 2017-11-18 NOTE — Assessment & Plan Note (Signed)
hgba1c to be checked, minimize simple carbs. Increase exercise as tolerated. Continue current meds  

## 2017-11-18 NOTE — Patient Instructions (Signed)

## 2017-11-18 NOTE — Assessment & Plan Note (Signed)
Tolerating statin, encouraged heart healthy diet, avoid trans fats, minimize simple carbs and saturated fats. Increase exercise as tolerated 

## 2017-11-23 ENCOUNTER — Other Ambulatory Visit: Payer: Self-pay

## 2017-11-23 NOTE — Progress Notes (Signed)
Patient Result Comments   Viewed by [name] on 11/22/2017 11:33 AM  Sent patient a my chart message to be aware to call and schedule 6 months labs. Orders entered.

## 2017-11-23 NOTE — Progress Notes (Signed)
Viewed by [name] on 11/22/2017 11:31 AM My chart message sent to patient for her to call and schedule f/u appt. With Dr. Carollee Herter in 6 months.

## 2017-11-23 NOTE — Progress Notes (Signed)
Viewed by [name] on 11/22/2017 11:31 AM

## 2017-11-23 NOTE — Progress Notes (Signed)
Viewed by [name] on 11/22/2017 11:31 My chart message sent for pt to call and schedule 6 months labs, Orders entered.

## 2017-12-07 DIAGNOSIS — H26493 Other secondary cataract, bilateral: Secondary | ICD-10-CM | POA: Diagnosis not present

## 2017-12-07 DIAGNOSIS — H353132 Nonexudative age-related macular degeneration, bilateral, intermediate dry stage: Secondary | ICD-10-CM | POA: Diagnosis not present

## 2017-12-07 DIAGNOSIS — H401131 Primary open-angle glaucoma, bilateral, mild stage: Secondary | ICD-10-CM | POA: Diagnosis not present

## 2018-02-08 ENCOUNTER — Other Ambulatory Visit: Payer: Self-pay | Admitting: Family Medicine

## 2018-02-08 DIAGNOSIS — E785 Hyperlipidemia, unspecified: Secondary | ICD-10-CM

## 2018-02-08 NOTE — Telephone Encounter (Signed)
Copied from [place] #[id number]. Topic: Quick Communication - Rx Refill/Question >> Feb 08, 2018  3:48 PM Arletha Grippe wrote: Medication: famotidine (PEPCID) 20 MG tablet Has the patient contacted their pharmacy? No. (Agent: If no, request that the patient contact the pharmacy for the refill.) Preferred Pharmacy (with phone number [facility]  Agent: Please be advised that RX refills may take up to 3 business days. We ask that you follow-up with your pharmacy.   Needs 90 day supply

## 2018-02-08 NOTE — Telephone Encounter (Signed)
Pepcid-requesting 90 day refill to mail order Last OV: 11/18/17 PCP: Carollee Herter Pharmacy: [facility], [place] [phone] (Phone) [phone] (Fax)

## 2018-02-09 MED ORDER — FAMOTIDINE 20 MG PO TABS: ORAL_TABLET | ORAL | 5 refills | Status: DC

## 2018-02-11 ENCOUNTER — Other Ambulatory Visit: Payer: Self-pay | Admitting: *Deleted

## 2018-02-11 MED ORDER — FAMOTIDINE 20 MG PO TABS: ORAL_TABLET | ORAL | 5 refills | Status: DC

## 2018-04-26 ENCOUNTER — Other Ambulatory Visit: Payer: Self-pay | Admitting: Family Medicine

## 2018-04-26 DIAGNOSIS — C159 Malignant neoplasm of esophagus, unspecified: Secondary | ICD-10-CM

## 2018-04-28 DIAGNOSIS — R918 Other nonspecific abnormal finding of lung field: Secondary | ICD-10-CM | POA: Diagnosis not present

## 2018-04-28 DIAGNOSIS — Z79899 Other long term (current) drug therapy: Secondary | ICD-10-CM | POA: Diagnosis not present

## 2018-04-28 DIAGNOSIS — Z8501 Personal history of malignant neoplasm of esophagus: Secondary | ICD-10-CM | POA: Diagnosis not present

## 2018-04-28 DIAGNOSIS — Z08 Encounter for follow-up examination after completed treatment for malignant neoplasm: Secondary | ICD-10-CM | POA: Diagnosis not present

## 2018-04-28 DIAGNOSIS — Z9221 Personal history of antineoplastic chemotherapy: Secondary | ICD-10-CM | POA: Diagnosis not present

## 2018-04-28 DIAGNOSIS — Z87891 Personal history of nicotine dependence: Secondary | ICD-10-CM | POA: Diagnosis not present

## 2018-04-28 DIAGNOSIS — Z9889 Other specified postprocedural states: Secondary | ICD-10-CM | POA: Diagnosis not present

## 2018-04-28 DIAGNOSIS — C159 Malignant neoplasm of esophagus, unspecified: Secondary | ICD-10-CM | POA: Diagnosis not present

## 2018-04-28 DIAGNOSIS — Z923 Personal history of irradiation: Secondary | ICD-10-CM | POA: Diagnosis not present

## 2018-05-19 ENCOUNTER — Ambulatory Visit (INDEPENDENT_AMBULATORY_CARE_PROVIDER_SITE_OTHER): Payer: Medicare Other | Admitting: Family Medicine

## 2018-05-19 ENCOUNTER — Encounter: Payer: Self-pay | Admitting: Family Medicine

## 2018-05-19 VITALS — BP 126/60 | Temp 98.1°F | Resp 16 | Ht 63.0 in | Wt 128.4 lb

## 2018-05-19 DIAGNOSIS — K219 Gastro-esophageal reflux disease without esophagitis: Secondary | ICD-10-CM | POA: Diagnosis not present

## 2018-05-19 DIAGNOSIS — E1169 Type 2 diabetes mellitus with other specified complication: Secondary | ICD-10-CM | POA: Diagnosis not present

## 2018-05-19 DIAGNOSIS — E119 Type 2 diabetes mellitus without complications: Secondary | ICD-10-CM | POA: Diagnosis not present

## 2018-05-19 DIAGNOSIS — E785 Hyperlipidemia, unspecified: Secondary | ICD-10-CM

## 2018-05-19 DIAGNOSIS — C159 Malignant neoplasm of esophagus, unspecified: Secondary | ICD-10-CM | POA: Diagnosis not present

## 2018-05-19 DIAGNOSIS — E559 Vitamin D deficiency, unspecified: Secondary | ICD-10-CM | POA: Diagnosis not present

## 2018-05-19 DIAGNOSIS — Z8501 Personal history of malignant neoplasm of esophagus: Secondary | ICD-10-CM | POA: Insufficient documentation

## 2018-05-19 LAB — CBC WITH DIFFERENTIAL/PLATELET
Basophils Absolute: 0 10*3/uL (ref 0.0–0.1)
Basophils Relative: 0.7 % (ref 0.0–3.0)
Eosinophils Absolute: 0.2 10*3/uL (ref 0.0–0.7)
Eosinophils Relative: 4.1 % (ref 0.0–5.0)
HCT: 38.4 % (ref 36.0–46.0)
Hemoglobin: 12.9 g/dL (ref 12.0–15.0)
Lymphocytes Relative: 26.7 % (ref 12.0–46.0)
Lymphs Abs: 1.3 10*3/uL (ref 0.7–4.0)
MCHC: 33.7 g/dL (ref 30.0–36.0)
MCV: 90.4 fl (ref 78.0–100.0)
Monocytes Absolute: 0.6 10*3/uL (ref 0.1–1.0)
Monocytes Relative: 11.9 % (ref 3.0–12.0)
Neutro Abs: 2.8 10*3/uL (ref 1.4–7.7)
Neutrophils Relative %: 56.6 % (ref 43.0–77.0)
Platelets: 207 10*3/uL (ref 150.0–400.0)
RBC: 4.24 Mil/uL (ref 3.87–5.11)
RDW: 14.6 % (ref 11.5–15.5)
WBC: 5 10*3/uL (ref 4.0–10.5)

## 2018-05-19 LAB — LIPID PANEL
Cholesterol: 189 mg/dL (ref 0–200)
HDL: 82.9 mg/dL (ref >39.00)
LDL Cholesterol: 86 mg/dL (ref 0–99)
NonHDL: 106.54
Total CHOL/HDL Ratio: 2
Triglycerides: 104 mg/dL (ref 0.0–149.0)
VLDL: 20.8 mg/dL (ref 0.0–40.0)

## 2018-05-19 LAB — COMPREHENSIVE METABOLIC PANEL
ALT: 17 U/L (ref 0–35)
AST: 20 U/L (ref 0–37)
Albumin: 4.2 g/dL (ref 3.5–5.2)
Alkaline Phosphatase: 52 U/L (ref 39–117)
BUN: 30 mg/dL — ABNORMAL HIGH (ref 6–23)
CO2: 31 mEq/L (ref 19–32)
Calcium: 9.7 mg/dL (ref 8.4–10.5)
Chloride: 104 mEq/L (ref 96–112)
Creatinine, Ser: 1.07 mg/dL (ref 0.40–1.20)
GFR: 53.34 mL/min — ABNORMAL LOW (ref >60.00)
Glucose, Bld: 124 mg/dL — ABNORMAL HIGH (ref 70–99)
Potassium: 4.1 mEq/L (ref 3.5–5.1)
Sodium: 140 mEq/L (ref 135–145)
Total Bilirubin: 0.6 mg/dL (ref 0.2–1.2)
Total Protein: 7.2 g/dL (ref 6.0–8.3)

## 2018-05-19 LAB — HEMOGLOBIN A1C: Hgb A1c MFr Bld: 6.9 % — ABNORMAL HIGH (ref 4.6–6.5)

## 2018-05-19 MED ORDER — FAMOTIDINE 20 MG PO TABS: ORAL_TABLET | ORAL | 3 refills | Status: DC

## 2018-05-19 NOTE — Assessment & Plan Note (Signed)
Check labs Cont' meds 

## 2018-05-19 NOTE — Assessment & Plan Note (Signed)
Check cbcd Pt released from oncology

## 2018-05-19 NOTE — Assessment & Plan Note (Signed)
Tolerating statin, encouraged heart healthy diet, avoid trans fats, minimize simple carbs and saturated fats. Increase exercise as tolerated 

## 2018-05-19 NOTE — Progress Notes (Signed)
Patient ID: Briana Craig, female   DOB: 1946/05/15, 72 y.o.   MRN: [id number]     Subjective:  I acted as a Education administrator for Dr. Carollee Herter.  Guerry Bruin, [place]   Patient ID: Briana Craig, female    DOB: [birth year], 72 y.o.   MRN: [id number]  Chief Complaint  Patient presents with  . Hyperlipidemia  . Diabetes    HPI  Patient is in today for follow up cholesterol and diabetes.  No complaints   DIABETES    Blood Sugar ranges-not checking   Polyuria- no New Visual problems- no  Hypoglycemic symptoms- no  Other side effects-no Medication compliance - good Last eye exam- annual Foot exam- today   HYPERLIPIDEMIA  Medication compliance- good RUQ pain- no  Muscle aches- no Other side effects-no  Patient Care Team: Ann Held, DO as PCP - General (Family Medicine)   Past Medical History:  Diagnosis Date  . Dysphagia   . Esophageal cancer ([place])   . GERD (gastroesophageal reflux disease)   . Hyperlipemia   . Hypertension   . Impaired fasting blood sugar   . PONV (postoperative nausea and vomiting)     Past Surgical History:  Procedure Laterality Date  . DIRECT LARYNGOSCOPY WITH BOTOX INJECTION     tx paralysed vocal cord  . ESOPHAGOGASTRODUODENOSCOPY  11/17/2011   Procedure: ESOPHAGOGASTRODUODENOSCOPY (EGD);  Surgeon: Owens Loffler, MD;  Location: [facility];  Service: Endoscopy;  Laterality: N/A;  . ESOPHAGUS SURGERY    . EYE SURGERY     both cataracts  . GANGLION CYST EXCISION Left 01/26/2013   Procedure: LEFT FLEXOR CARPI RADIALIS  RELEASE AND SCAPHO TRAPEZIAL TRAPEZOID DEBRIDEMENT ;  Surgeon: Tennis Must, MD;  Location: [facility];  Service: Orthopedics;  Laterality: Left;    Family History  Problem Relation Age of Onset  . Colon cancer Neg Hx   . Alcohol abuse Sister   . AAA (abdominal aortic aneurysm) Mother   . COPD Father     Social History   Socioeconomic History  . Marital status: Married    Spouse name: Sonia Side  . Number  of children: 0  . Years of education: 65  . Highest education level: Not on file  Occupational History  . Occupation: RETIRED     Employer: [employer]  . Financial resource strain: Not on file  . Food insecurity:    Worry: Not on file    Inability: Not on file  . Transportation needs:    Medical: Not on file    Non-medical: Not on file  Tobacco Use  . Smoking status: Former Smoker    Types: Cigarettes    Last attempt to quit: 08/30/1987    Years since quitting: 30.7  . Smokeless tobacco: Never Used  Substance and Sexual Activity  . Alcohol use: No  . Drug use: Not on file  . Sexual activity: Yes    Partners: Male    Birth control/protection: Post-menopausal  Lifestyle  . Physical activity:    Days per week: Not on file    Minutes per session: Not on file  . Stress: Not on file  Relationships  . Social connections:    Talks on phone: Not on file    Gets together: Not on file    Attends religious service: Not on file    Active member of club or organization: Not on file    Attends meetings of clubs or organizations: Not on file  Relationship status: Not on file  . Intimate partner violence:    Fear of current or ex partner: Not on file    Emotionally abused: Not on file    Physically abused: Not on file    Forced sexual activity: Not on file  Other Topics Concern  . Not on file  Social History Narrative   Marital Status:  Married Sonia Side)   Children:  None    Pets: Dog (1)    Living Situation: Lives with husband.   Occupation:  Retired Visual merchandiser)    Education: Master's Degree    Tobacco Use/Exposure:  Formal Smoker    Alcohol Use:  Occasional   Drug Use:  None   Diet:  Regular   Exercise:  Walking 1 mile 5 times per week.    Hobbies:  Reading                 Outpatient Medications Prior to Visit  Medication Sig Dispense Refill  . CALCIUM-VITAMIN D PO Take 600 mg by mouth daily.     . cetirizine (ZYRTEC) 10 MG tablet Take 10 mg by mouth as  needed for allergies.    . fluticasone (FLONASE) 50 MCG/ACT nasal spray Place 2 sprays into the nose daily as needed.     Marland Kitchen glucose blood test strip Use as instructed--  Check glucose qd-- one touch ultra mini 100 each 1  . Lancets MISC Use as directed once a day. DX code E11.9 100 each 1  . latanoprost (XALATAN) 0.005 % ophthalmic solution Place 1 drop into both eyes daily.    . Multiple Vitamins-Minerals (MULTIVITAMIN PO) Take 1 tablet by mouth daily.     . Multiple Vitamins-Minerals (PRESERVISION AREDS) CAPS Take by mouth.    . Omega-3 Fatty Acids (FISH OIL PO) Take 1,200 mg by mouth daily.     . ONE TOUCH ULTRA TEST test strip USE TO CHECK BLOOD SUGAR ONCE A DAY 100 each 3  . pantoprazole (PROTONIX) 40 MG tablet TAKE 1 TABLET DAILY 90 tablet 1  . simvastatin (ZOCOR) 20 MG tablet TAKE 1 TABLET DAILY AT 6 P.M. 90 tablet 1  . famotidine (PEPCID) 20 MG tablet TAKE 1 TABLET (20 MG TOTAL) BY MOUTH 2 (TWO) TIMES DAILY. 60 tablet 5   No facility-administered medications prior to visit.     Allergies  Allergen Reactions  . Taxol [Paclitaxel] Hives and Itching  . Tramadol     "Sweaty, shaky and faint"  . Carboplatin Hives and Itching  . Codeine   . Compazine   . Compazine  [Prochlorperazine Maleate] Other (See Comments)    Severe chest pain  . Penicillins   . Prochlorperazine Edisylate     Other reaction(s): Other (See Comments) Severe chest pain    Review of Systems  Constitutional: Negative for fever and malaise/fatigue.  HENT: Negative for congestion.   Eyes: Negative for blurred vision.  Respiratory: Negative for cough and shortness of breath.   Cardiovascular: Negative for chest pain, palpitations and leg swelling.  Gastrointestinal: Negative for vomiting.  Musculoskeletal: Negative for back pain.  Skin: Negative for rash.  Neurological: Negative for loss of consciousness and headaches.       Objective:    Physical Exam  Constitutional: She is oriented to person, place,  and time. She appears well-developed and well-nourished.  HENT:  Head: Normocephalic and atraumatic.  Eyes: Conjunctivae and EOM are normal.  Neck: Normal range of motion. Neck supple. No JVD present. Carotid bruit is not present.  No thyromegaly present.  Cardiovascular: Normal rate, regular rhythm and normal heart sounds.  No murmur heard. Pulmonary/Chest: Effort normal and breath sounds normal. No respiratory distress. She has no wheezes. She has no rales. She exhibits no tenderness.  Musculoskeletal: She exhibits no edema.  Neurological: She is alert and oriented to person, place, and time.  Psychiatric: She has a normal mood and affect.  Nursing note and vitals reviewed.  Diabetic Foot Exam - Simple   Simple Foot Form Diabetic Foot exam was performed with the following findings:  Yes 05/19/2018  9:08 AM  Visual Inspection No deformities, no ulcerations, no other skin breakdown bilaterally:  Yes Sensation Testing Intact to touch and monofilament testing bilaterally:  Yes Pulse Check Posterior Tibialis and Dorsalis pulse intact bilaterally:  Yes Comments      BP 126/60 (BP Location: Right Arm, Cuff Size: Normal)   Temp 98.1 F (36.7 C) (Oral)   Resp 16   Ht 5\' 3"  (1.6 m)   Wt 128 lb 6.4 oz (58.2 kg)   BMI 22.75 kg/m  Wt Readings from Last 3 Encounters:  05/19/18 128 lb 6.4 oz (58.2 kg)  11/18/17 125 lb (56.7 kg)  06/10/17 123 lb 14.4 oz (56.2 kg)   BP Readings from Last 3 Encounters:  05/19/18 126/60  11/18/17 128/80  06/10/17 127/86     Immunization History  Administered Date(s) Administered  . Influenza, High Dose Seasonal PF 08/16/2014, 08/13/2015, 08/20/2016  . Influenza-Unspecified 08/13/2015  . Pneumococcal Conjugate-13 09/03/2014  . Pneumococcal Polysaccharide-23 12/03/2010, 11/16/2016  . Tdap 05/01/2013    Health Maintenance  Topic Date Due  . URINE MICROALBUMIN  05/17/2018  . HEMOGLOBIN A1C  05/18/2018  . MAMMOGRAM  11/23/2019 (Originally  10/12/1962)  . INFLUENZA VACCINE  06/02/2018  . OPHTHALMOLOGY EXAM  06/18/2018  . FOOT EXAM  05/20/2019  . COLONOSCOPY  01/04/2021  . TETANUS/TDAP  05/02/2023  . DEXA SCAN  Completed  . Hepatitis C Screening  Completed  . PNA vac Low Risk Adult  Completed    Lab Results  Component Value Date   WBC 5.0 05/19/2018   HGB 12.9 05/19/2018   HCT 38.4 05/19/2018   PLT 207.0 05/19/2018   GLUCOSE 124 (H) 05/19/2018   CHOL 189 05/19/2018   TRIG 104.0 05/19/2018   HDL 82.90 05/19/2018   LDLCALC 86 05/19/2018   ALT 17 05/19/2018   AST 20 05/19/2018   NA 140 05/19/2018   K 4.1 05/19/2018   CL 104 05/19/2018   CREATININE 1.07 05/19/2018   BUN 30 (H) 05/19/2018   CO2 31 05/19/2018   TSH 2.147 04/24/2013   HGBA1C 6.9 (H) 05/19/2018   MICROALBUR <0.7 05/17/2017    Lab Results  Component Value Date   TSH 2.147 04/24/2013   Lab Results  Component Value Date   WBC 5.0 05/19/2018   HGB 12.9 05/19/2018   HCT 38.4 05/19/2018   MCV 90.4 05/19/2018   PLT 207.0 05/19/2018   Lab Results  Component Value Date   NA 140 05/19/2018   K 4.1 05/19/2018   CO2 31 05/19/2018   GLUCOSE 124 (H) 05/19/2018   BUN 30 (H) 05/19/2018   CREATININE 1.07 05/19/2018   BILITOT 0.6 05/19/2018   ALKPHOS 52 05/19/2018   AST 20 05/19/2018   ALT 17 05/19/2018   PROT 7.2 05/19/2018   ALBUMIN 4.2 05/19/2018   CALCIUM 9.7 05/19/2018   GFR 53.34 (L) 05/19/2018   Lab Results  Component Value Date   CHOL 189 05/19/2018  Lab Results  Component Value Date   HDL 82.90 05/19/2018   Lab Results  Component Value Date   LDLCALC 86 05/19/2018   Lab Results  Component Value Date   TRIG 104.0 05/19/2018   Lab Results  Component Value Date   CHOLHDL 2 05/19/2018   Lab Results  Component Value Date   HGBA1C 6.9 (H) 05/19/2018         Assessment & Plan:   Problem List Items Addressed This Visit      Unprioritized   Cancer of esophagus ([place])    Check cbcd Pt released from oncology       Controlled type 2 diabetes mellitus without complication, without long-term current use of insulin ([place]) - Primary    Check labs  Cont meds       Relevant Orders   Comprehensive metabolic panel (Completed)   Hemoglobin A1c (Completed)   Microalbumin / creatinine urine ratio   GERD (gastroesophageal reflux disease)   Relevant Medications   famotidine (PEPCID) 20 MG tablet   History of esophageal cancer   Relevant Orders   CBC with Differential/Platelet (Completed)   Hyperlipidemia associated with type 2 diabetes mellitus ([place])    Tolerating statin, encouraged heart healthy diet, avoid trans fats, minimize simple carbs and saturated fats. Increase exercise as tolerated      Relevant Orders   Comprehensive metabolic panel (Completed)   Lipid panel (Completed)   Vitamin D deficiency   Relevant Orders   Vitamin D 1,25 dihydroxy      I have changed [facility]'s famotidine. I am also having her maintain her CALCIUM-VITAMIN D PO, Multiple Vitamins-Minerals (MULTIVITAMIN PO), Omega-3 Fatty Acids (FISH OIL PO), cetirizine, fluticasone, glucose blood, Lancets, ONE TOUCH ULTRA TEST, PRESERVISION AREDS, latanoprost, simvastatin, and pantoprazole.  Meds ordered this encounter  Medications  . famotidine (PEPCID) 20 MG tablet    Sig: TAKE 1 TABLET (20 MG TOTAL)  Qd    Dispense:  90 tablet    Refill:  3    CMA served as scribe during this visit. History, Physical and Plan performed by medical provider. Documentation and orders reviewed and attested to.  Ann Held, DO

## 2018-05-19 NOTE — Patient Instructions (Signed)

## 2018-05-22 LAB — VITAMIN D 1,25 DIHYDROXY
Vitamin D 1, 25 (OH)2 Total: 42 pg/mL (ref 18–72)
Vitamin D2 1, 25 (OH)2: <8 pg/mL
Vitamin D3 1, 25 (OH)2: 42 pg/mL

## 2018-05-23 NOTE — Addendum Note (Signed)
Addended byDamita Dunnings D on: 05/23/2018 11:52 AM   Modules accepted: Orders

## 2018-06-03 ENCOUNTER — Other Ambulatory Visit: Payer: Self-pay

## 2018-06-30 ENCOUNTER — Telehealth: Payer: Self-pay | Admitting: *Deleted

## 2018-06-30 DIAGNOSIS — D229 Melanocytic nevi, unspecified: Secondary | ICD-10-CM

## 2018-06-30 NOTE — Telephone Encounter (Signed)
Copied from [place] [id number]. Topic: Referral - Request >> Jun 29, 2018  9:36 AM Ahmed Prima L wrote: Reason for CRM: Patient said she has a place on her face and it is on her right side. It has been there about a month. She wants to see a dermatologist. She would like that to go to someone in the [facility] group. Please advise.

## 2018-07-01 NOTE — Telephone Encounter (Signed)
Ok to refer    Dx suspicious nevus

## 2018-07-01 NOTE — Telephone Encounter (Signed)
Referral has been placed. 

## 2018-07-01 NOTE — Telephone Encounter (Signed)
Are you okay with referral without seeing her?  If so what diagnosis would you like to use?

## 2018-07-12 DIAGNOSIS — H26493 Other secondary cataract, bilateral: Secondary | ICD-10-CM | POA: Diagnosis not present

## 2018-07-12 DIAGNOSIS — H401131 Primary open-angle glaucoma, bilateral, mild stage: Secondary | ICD-10-CM | POA: Diagnosis not present

## 2018-07-12 DIAGNOSIS — H353132 Nonexudative age-related macular degeneration, bilateral, intermediate dry stage: Secondary | ICD-10-CM | POA: Diagnosis not present

## 2018-07-12 LAB — HM DIABETES EYE EXAM

## 2018-07-21 ENCOUNTER — Encounter: Payer: Self-pay | Admitting: Family Medicine

## 2018-08-07 ENCOUNTER — Other Ambulatory Visit: Payer: Self-pay | Admitting: Family Medicine

## 2018-08-07 DIAGNOSIS — E785 Hyperlipidemia, unspecified: Secondary | ICD-10-CM

## 2018-08-16 DIAGNOSIS — Z23 Encounter for immunization: Secondary | ICD-10-CM | POA: Diagnosis not present

## 2018-09-19 ENCOUNTER — Other Ambulatory Visit: Payer: Self-pay

## 2018-10-23 ENCOUNTER — Other Ambulatory Visit: Payer: Self-pay | Admitting: Family Medicine

## 2018-10-23 DIAGNOSIS — C159 Malignant neoplasm of esophagus, unspecified: Secondary | ICD-10-CM

## 2018-11-21 ENCOUNTER — Encounter: Payer: Self-pay | Admitting: Family Medicine

## 2018-11-21 ENCOUNTER — Ambulatory Visit (INDEPENDENT_AMBULATORY_CARE_PROVIDER_SITE_OTHER): Payer: Medicare Other | Admitting: Family Medicine

## 2018-11-21 VITALS — BP 126/78 | HR 76 | Temp 97.6°F | Resp 16 | Ht 63.0 in | Wt 127.4 lb

## 2018-11-21 DIAGNOSIS — E1169 Type 2 diabetes mellitus with other specified complication: Secondary | ICD-10-CM

## 2018-11-21 DIAGNOSIS — E1151 Type 2 diabetes mellitus with diabetic peripheral angiopathy without gangrene: Secondary | ICD-10-CM

## 2018-11-21 DIAGNOSIS — N649 Disorder of breast, unspecified: Secondary | ICD-10-CM

## 2018-11-21 DIAGNOSIS — C159 Malignant neoplasm of esophagus, unspecified: Secondary | ICD-10-CM

## 2018-11-21 DIAGNOSIS — IMO0002 Reserved for concepts with insufficient information to code with codable children: Secondary | ICD-10-CM

## 2018-11-21 DIAGNOSIS — E1165 Type 2 diabetes mellitus with hyperglycemia: Secondary | ICD-10-CM | POA: Diagnosis not present

## 2018-11-21 DIAGNOSIS — E785 Hyperlipidemia, unspecified: Secondary | ICD-10-CM

## 2018-11-21 LAB — COMPREHENSIVE METABOLIC PANEL
ALT: 15 U/L (ref 0–35)
AST: 21 U/L (ref 0–37)
Albumin: 4.3 g/dL (ref 3.5–5.2)
Alkaline Phosphatase: 53 U/L (ref 39–117)
BUN: 14 mg/dL (ref 6–23)
CO2: 28 mEq/L (ref 19–32)
Calcium: 10 mg/dL (ref 8.4–10.5)
Chloride: 104 mEq/L (ref 96–112)
Creatinine, Ser: 1.07 mg/dL (ref 0.40–1.20)
GFR: 50.11 mL/min — ABNORMAL LOW (ref >60.00)
Glucose, Bld: 128 mg/dL — ABNORMAL HIGH (ref 70–99)
Potassium: 4.2 mEq/L (ref 3.5–5.1)
Sodium: 142 mEq/L (ref 135–145)
Total Bilirubin: 0.7 mg/dL (ref 0.2–1.2)
Total Protein: 7.3 g/dL (ref 6.0–8.3)

## 2018-11-21 LAB — LIPID PANEL
Cholesterol: 195 mg/dL (ref 0–200)
HDL: 85.3 mg/dL (ref >39.00)
LDL Cholesterol: 90 mg/dL (ref 0–99)
NonHDL: 109.88
Total CHOL/HDL Ratio: 2
Triglycerides: 101 mg/dL (ref 0.0–149.0)
VLDL: 20.2 mg/dL (ref 0.0–40.0)

## 2018-11-21 LAB — HEMOGLOBIN A1C: Hgb A1c MFr Bld: 6.9 % — ABNORMAL HIGH (ref 4.6–6.5)

## 2018-11-21 NOTE — Assessment & Plan Note (Signed)
hgba1c to be checked, minimize simple carbs. Increase exercise as tolerated. Continue current meds  

## 2018-11-21 NOTE — Assessment & Plan Note (Signed)
Diagnostic mammogram ordered  Derm referral placed as well

## 2018-11-21 NOTE — Assessment & Plan Note (Signed)
Tolerating statin, encouraged heart healthy diet, avoid trans fats, minimize simple carbs and saturated fats. Increase exercise as tolerated 

## 2018-11-21 NOTE — Progress Notes (Signed)
Patient ID: Briana Craig, female    DOB: 1946-01-30  Age: 73 y.o. MRN: [id number]    Subjective:  Subjective  HPI Briana Craig presents for f/u dm, chol  Pt also c/o "pimple" on R nipple.   HPI    DIABETES    Blood Sugar ranges-not checking  Polyuria- no New Visual problems- no  Hypoglycemic symptoms- no  Other side effects-no Medication compliance - good Last eye exam- due Foot exam- today   HYPERLIPIDEMIA  Medication compliance- good RUQ pain- no  Muscle aches- no Other side effects-no      Review of Systems  Constitutional: Negative for appetite change, diaphoresis, fatigue and unexpected weight change.  Eyes: Negative for pain, redness and visual disturbance.  Respiratory: Negative for cough, chest tightness, shortness of breath and wheezing.   Cardiovascular: Negative for chest pain, palpitations and leg swelling.  Endocrine: Negative for cold intolerance, heat intolerance, polydipsia, polyphagia and polyuria.  Genitourinary: Negative for difficulty urinating, dysuria and frequency.  Neurological: Negative for dizziness, light-headedness, numbness and headaches.    History Past Medical History:  Diagnosis Date  . Dysphagia   . Esophageal cancer ([place])   . GERD (gastroesophageal reflux disease)   . Hyperlipemia   . Hypertension   . Impaired fasting blood sugar   . PONV (postoperative nausea and vomiting)     She has a past surgical history that includes Esophagus surgery; Esophagogastroduodenoscopy (11/17/2011); Eye surgery; Direct laryngoscopy with botox injection; and Ganglion cyst excision (Left, 01/26/2013).   Her family history includes AAA (abdominal aortic aneurysm) in her mother; Alcohol abuse in her sister; COPD in her father.She reports that she quit smoking about 31 years ago. Her smoking use included cigarettes. She has never used smokeless tobacco. She reports that she does not drink alcohol. No history on file for drug.  Current Outpatient  Medications on File Prior to Visit  Medication Sig Dispense Refill  . CALCIUM-VITAMIN D PO Take 600 mg by mouth daily.     . cetirizine (ZYRTEC) 10 MG tablet Take 10 mg by mouth as needed for allergies.    . famotidine (PEPCID) 20 MG tablet TAKE 1 TABLET (20 MG TOTAL)  Qd 90 tablet 3  . fluticasone (FLONASE) 50 MCG/ACT nasal spray Place 2 sprays into the nose daily as needed.     Marland Kitchen glucose blood test strip Use as instructed--  Check glucose qd-- one touch ultra mini 100 each 1  . Lancets MISC Use as directed once a day. DX code E11.9 100 each 1  . latanoprost (XALATAN) 0.005 % ophthalmic solution Place 1 drop into both eyes daily.    . Multiple Vitamins-Minerals (MULTIVITAMIN PO) Take 1 tablet by mouth daily.     . Multiple Vitamins-Minerals (PRESERVISION AREDS) CAPS Take by mouth.    . Omega-3 Fatty Acids (FISH OIL PO) Take 1,200 mg by mouth daily.     . ONE TOUCH ULTRA TEST test strip USE TO CHECK BLOOD SUGAR ONCE A DAY 100 each 3  . pantoprazole (PROTONIX) 40 MG tablet TAKE 1 TABLET DAILY 90 tablet 3  . simvastatin (ZOCOR) 20 MG tablet TAKE 1 TABLET DAILY AT 6 P.M. 90 tablet 4   No current facility-administered medications on file prior to visit.      Objective:  Objective  Physical Exam Vitals signs and nursing note reviewed.  Constitutional:      Appearance: She is well-developed.  HENT:     Head: Normocephalic and atraumatic.  Eyes:  Conjunctiva/sclera: Conjunctivae normal.  Neck:     Musculoskeletal: Normal range of motion and neck supple.     Thyroid: No thyromegaly.     Vascular: No carotid bruit or JVD.  Cardiovascular:     Rate and Rhythm: Normal rate and regular rhythm.     Heart sounds: Normal heart sounds. No murmur.  Pulmonary:     Effort: Pulmonary effort is normal. No respiratory distress.     Breath sounds: Normal breath sounds. No wheezing or rales.  Chest:     Chest wall: No tenderness.     Breasts: Breasts are symmetrical.        Right: Skin change  present. No swelling, bleeding, inverted nipple, mass, nipple discharge or tenderness.        Left: Inverted nipple present. No mass, nipple discharge or tenderness.    Lymphadenopathy:     Upper Body:     Right upper body: No supraclavicular, axillary or pectoral adenopathy.     Left upper body: No supraclavicular, axillary or pectoral adenopathy.  Neurological:     Mental Status: She is alert and oriented to person, place, and time.    Diabetic Foot Exam - Simple   Simple Foot Form Diabetic Foot exam was performed with the following findings:  Yes 11/21/2018  9:12 AM  Visual Inspection No deformities, no ulcerations, no other skin breakdown bilaterally:  Yes Sensation Testing Intact to touch and monofilament testing bilaterally:  Yes Pulse Check Posterior Tibialis and Dorsalis pulse intact bilaterally:  Yes Comments    BP 126/78 (BP Location: Right Arm, Cuff Size: Normal)   Pulse 76   Temp 97.6 F (36.4 C) (Oral)   Resp 16   Ht 5\' 3"  (1.6 m)   Wt 127 lb 6.4 oz (57.8 kg)   SpO2 90%   BMI 22.57 kg/m  Wt Readings from Last 3 Encounters:  11/21/18 127 lb 6.4 oz (57.8 kg)  05/19/18 128 lb 6.4 oz (58.2 kg)  11/18/17 125 lb (56.7 kg)     Lab Results  Component Value Date   WBC 5.0 05/19/2018   HGB 12.9 05/19/2018   HCT 38.4 05/19/2018   PLT 207.0 05/19/2018   GLUCOSE 124 (H) 05/19/2018   CHOL 189 05/19/2018   TRIG 104.0 05/19/2018   HDL 82.90 05/19/2018   LDLCALC 86 05/19/2018   ALT 17 05/19/2018   AST 20 05/19/2018   NA 140 05/19/2018   K 4.1 05/19/2018   CL 104 05/19/2018   CREATININE 1.07 05/19/2018   BUN 30 (H) 05/19/2018   CO2 31 05/19/2018   TSH 2.147 04/24/2013   HGBA1C 6.9 (H) 05/19/2018   MICROALBUR <0.7 05/17/2017    Dg Bone Density  Result Date: 11/29/2015 EXAM: DUAL X-RAY ABSORPTIOMETRY (DXA) FOR BONE MINERAL DENSITY IMPRESSION: Referring Physician:  Rosalita Chessman PATIENT: Name: Briana, Craig Patient ID: [id number] Birth Date: [birth year]  Height: 62.0 in. Sex: Female Measured: 11/29/2015 Weight: 116.0 lbs. Indications: Advanced Age, Caucasian, Estrogen Deficiency, Post Menopausal, Previous Tobacco User Fractures: Treatments: Calcium, Vitamin D ASSESSMENT: The BMD measured at Femur Total Left is 0.720 g/cm2 with a T-score of -2.3. This patient is considered osteopenic according to [facility]) criteria. Site Region Measured Date Measured Age WHO YA BMD Classification T-score AP Spine L1-L4 11/29/2015 71.1 Osteopenia -2.0 0.944 g/cm2 DualFemur Total Left 11/29/2015 71.1 years Osteopenia -2.3 0.720 g/cm2 World Health Organization [facility]) criteria for post-menopausal, Caucasian Women: Normal       T-score at or above -1  SD Osteopenia   T-score between -1 and -2.5 SD Osteoporosis T-score at or below -2.5 SD RECOMMENDATION: [place] recommends that FDA-approved medical therapies be considered in postmenopausal women and men age 46 or older with a: 1. Hip or vertebral (clinical or morphometric) fracture. 2. T-score of < -2.5 at the spine or hip. 3. Ten-year fracture probability by FRAX of 3% or greater for hip fracture or 20% or greater for major osteoporotic fracture. All treatment decisions require clinical judgment and consideration of individual patient factors, including patient preferences, co-morbidities, previous drug use, risk factors not captured in the FRAX model (e.g. falls, vitamin D deficiency, increased bone turnover, interval significant decline in bone density) and possible under - or over-estimation of fracture risk by FRAX. All patients should ensure an adequate intake of dietary calcium (1200 mg/d) and vitamin D (800 IU daily) unless contraindicated. FOLLOW-UP: People with diagnosed cases of osteoporosis or at high risk for fracture should have regular bone mineral density tests. For patients eligible for Medicare, routine testing is allowed once every 2 years. The testing frequency can be increased  to one year for patients who have rapidly progressing disease, those who are receiving or discontinuing medical therapy to restore bone mass, or have additional risk factors. I have reviewed this report and agree with the above findings. [facility] Patient: Briana Craig Referring Physician: Rosalita Chessman Birth Date: [birth year] [age] Patient ID: [id number] Height: 62.0 in. Weight: 116.0 lbs. Measured: 11/29/2015 3:09:22 PM (16 SP 2) Sex: Female Ethnicity: White Analyzed: 11/29/2015 3:12:57 PM (16 SP 2) FRAX* 10-year Probability of Fracture Based on femoral neck BMD: DualFemur (Left) Major Osteoporotic Fracture: 11.2% Hip Fracture:                2.5% Population:                  [place] (Caucasian) Risk Factors:                None *FRAX is a Materials engineer of the [employer] for Metabolic Bone Disease, a World Pharmacologist (WHO) [facility]. ASSESSMENT: The probability of a major osteoporotic fracture is 11.2% within the next ten years. The probability of a hip fracture is 2.5% within the next ten years. Electronically Signed   By: Marijo Conception, M.D.   On: 11/29/2015 15:59     Assessment & Plan:  Plan  I am having Briana Craig maintain her CALCIUM-VITAMIN D PO, Multiple Vitamins-Minerals (MULTIVITAMIN PO), Omega-3 Fatty Acids (FISH OIL PO), cetirizine, fluticasone, glucose blood, Lancets, ONE TOUCH ULTRA TEST, PRESERVISION AREDS, latanoprost, famotidine, simvastatin, and pantoprazole.  No orders of the defined types were placed in this encounter.   Problem List Items Addressed This Visit      Unprioritized   DM (diabetes mellitus) type II uncontrolled, periph vascular disorder ([place])    hgba1c to be checked, minimize simple carbs. Increase exercise as tolerated. Continue current meds       Relevant Orders   Lipid panel   Comprehensive metabolic panel   Hemoglobin A1c   Esophageal cancer (H[facility])   Hyperlipidemia     Tolerating statin, encouraged heart healthy diet, avoid trans fats, minimize simple carbs and saturated fats. Increase exercise as tolerated      Hyperlipidemia associated with type 2 diabetes mellitus ([facility])   Relevant Orders   Lipid panel   Comprehensive metabolic panel   Nipple lesion -  Primary    Diagnostic mammogram ordered  Derm referral placed as well      Relevant Orders   Ambulatory referral to Dermatology   US BREAST LTD UNI RIGHT INC AXILLA   MM DIAG BREAST TOMO BILATERAL      Follow-up: Return in about 6 months (around 05/22/2019), or if symptoms worsen or fail to improve, for hyperlipidemia, diabetes II.  Ann Held, DO

## 2018-11-21 NOTE — Patient Instructions (Signed)
Carbohydrate Counting for Diabetes Mellitus, Adult  Carbohydrate counting is a method of keeping track of how many carbohydrates you eat. Eating carbohydrates naturally increases the amount of sugar (glucose) in the blood. Counting how many carbohydrates you eat helps keep your blood glucose within normal limits, which helps you manage your diabetes (diabetes mellitus). It is important to know how many carbohydrates you can safely have in each meal. This is different for every person. A diet and nutrition specialist (registered dietitian) can help you make a meal plan and calculate how many carbohydrates you should have at each meal and snack. Carbohydrates are found in the following foods:  Grains, such as breads and cereals.  Dried beans and soy products.  Starchy vegetables, such as potatoes, peas, and corn.  Fruit and fruit juices.  Milk and yogurt.  Sweets and snack foods, such as cake, cookies, candy, chips, and soft drinks. How do I count carbohydrates? There are two ways to count carbohydrates in food. You can use either of the methods or a combination of both. Reading "Nutrition Facts" on packaged food The "Nutrition Facts" list is included on the labels of almost all packaged foods and beverages in the [place] It includes:  The serving size.  Information about nutrients in each serving, including the grams (g) of carbohydrate per serving. To use the "Nutrition Facts":  Decide how many servings you will have.  Multiply the number of servings by the number of carbohydrates per serving.  The resulting number is the total amount of carbohydrates that you will be having. Learning standard serving sizes of other foods When you eat carbohydrate foods that are not packaged or do not include "Nutrition Facts" on the label, you need to measure the servings in order to count the amount of carbohydrates:  Measure the foods that you will eat with a food scale or measuring cup, if needed.   Decide how many standard-size servings you will eat.  Multiply the number of servings by 15. Most carbohydrate-rich foods have about 15 g of carbohydrates per serving. ? For example, if you eat 8 oz (170 g) of strawberries, you will have eaten 2 servings and 30 g of carbohydrates (2 servings x 15 g = 30 g).  For foods that have more than one food mixed, such as soups and casseroles, you must count the carbohydrates in each food that is included. The following list contains standard serving sizes of common carbohydrate-rich foods. Each of these servings has about 15 g of carbohydrates:   hamburger bun or  English muffin.   oz (15 mL) syrup.   oz (14 g) jelly.  1 slice of bread.  1 six-inch tortilla.  3 oz (85 g) cooked rice or pasta.  4 oz (113 g) cooked dried beans.  4 oz (113 g) starchy vegetable, such as peas, corn, or potatoes.  4 oz (113 g) hot cereal.  4 oz (113 g) mashed potatoes or  of a large baked potato.  4 oz (113 g) canned or frozen fruit.  4 oz (120 mL) fruit juice.  4-6 crackers.  6 chicken nuggets.  6 oz (170 g) unsweetened dry cereal.  6 oz (170 g) plain fat-free yogurt or yogurt sweetened with artificial sweeteners.  8 oz (240 mL) milk.  8 oz (170 g) fresh fruit or one small piece of fruit.  24 oz (680 g) popped popcorn. Example of carbohydrate counting Sample meal  3 oz (85 g) chicken breast.  6 oz (170 g)   brown rice.  4 oz (113 g) corn.  8 oz (240 mL) milk.  8 oz (170 g) strawberries with sugar-free whipped topping. Carbohydrate calculation 1. Identify the foods that contain carbohydrates: ? Rice. ? Corn. ? Milk. ? Strawberries. 2. Calculate how many servings you have of each food: ? 2 servings rice. ? 1 serving corn. ? 1 serving milk. ? 1 serving strawberries. 3. Multiply each number of servings by 15 g: ? 2 servings rice x 15 g = 30 g. ? 1 serving corn x 15 g = 15 g. ? 1 serving milk x 15 g = 15 g. ? 1 serving  strawberries x 15 g = 15 g. 4. Add together all of the amounts to find the total grams of carbohydrates eaten: ? 30 g + 15 g + 15 g + 15 g = 75 g of carbohydrates total. Summary  Carbohydrate counting is a method of keeping track of how many carbohydrates you eat.  Eating carbohydrates naturally increases the amount of sugar (glucose) in the blood.  Counting how many carbohydrates you eat helps keep your blood glucose within normal limits, which helps you manage your diabetes.  A diet and nutrition specialist (registered dietitian) can help you make a meal plan and calculate how many carbohydrates you should have at each meal and snack. This information is not intended to replace advice given to you by your health care provider. Make sure you discuss any questions you have with your health care provider. Document Released: 10/19/2005 Document Revised: 04/28/2017 Document Reviewed: 04/01/2016 Elsevier Interactive Patient Education  2019 Elsevier Inc.  

## 2018-11-24 ENCOUNTER — Other Ambulatory Visit: Payer: Self-pay | Admitting: Family Medicine

## 2018-11-24 ENCOUNTER — Ambulatory Visit
Admission: RE | Admit: 2018-11-24 | Discharge: 2018-11-24 | Disposition: A | Discharge status: Home / Self Care | Payer: Medicare Other | Source: Ambulatory Visit | Attending: Family Medicine | Admitting: Family Medicine

## 2018-11-24 DIAGNOSIS — E1129 Type 2 diabetes mellitus with other diabetic kidney complication: Secondary | ICD-10-CM

## 2018-11-24 DIAGNOSIS — R921 Mammographic calcification found on diagnostic imaging of breast: Secondary | ICD-10-CM

## 2018-11-24 DIAGNOSIS — N644 Mastodynia: Secondary | ICD-10-CM | POA: Diagnosis not present

## 2018-11-24 DIAGNOSIS — R928 Other abnormal and inconclusive findings on diagnostic imaging of breast: Secondary | ICD-10-CM | POA: Diagnosis not present

## 2018-11-24 DIAGNOSIS — N649 Disorder of breast, unspecified: Secondary | ICD-10-CM

## 2018-12-01 DIAGNOSIS — C44591 Other specified malignant neoplasm of skin of breast: Secondary | ICD-10-CM | POA: Diagnosis not present

## 2018-12-01 DIAGNOSIS — C50011 Malignant neoplasm of nipple and areola, right female breast: Secondary | ICD-10-CM | POA: Diagnosis not present

## 2018-12-03 DIAGNOSIS — C50919 Malignant neoplasm of unspecified site of unspecified female breast: Secondary | ICD-10-CM

## 2018-12-03 HISTORY — DX: Malignant neoplasm of unspecified site of unspecified female breast: C50.919

## 2018-12-06 ENCOUNTER — Telehealth: Payer: Self-pay | Admitting: Family Medicine

## 2018-12-06 DIAGNOSIS — IMO0002 Reserved for concepts with insufficient information to code with codable children: Secondary | ICD-10-CM

## 2018-12-06 DIAGNOSIS — E1165 Type 2 diabetes mellitus with hyperglycemia: Secondary | ICD-10-CM

## 2018-12-06 DIAGNOSIS — E119 Type 2 diabetes mellitus without complications: Secondary | ICD-10-CM

## 2018-12-06 MED ORDER — GLUCOSE BLOOD VI STRP: ORAL_STRIP | 1 refills | Status: DC

## 2018-12-06 MED ORDER — LANCETS MISC: 1 refills | Status: DC

## 2018-12-06 NOTE — Telephone Encounter (Signed)
Medication sent in. 

## 2018-12-06 NOTE — Telephone Encounter (Signed)
Copied from [place] [id number]. Topic: Quick Communication - Rx Refill/Question >> Dec 06, 2018  9:16 AM Keene Breath wrote: Medication: ONE TOUCH ULTRA TEST test strip/ Lancets MISC  Patient called to request a refill for the above medications  Preferred Pharmacy (with phone number or street name): [id number] - [place], [place] - [address] AT [place] [phone] (Phone) [phone] (Fax)

## 2018-12-06 NOTE — Telephone Encounter (Signed)
Pt last seen in office 11/21/2018; Will route to office for final disposition. "To pharmacy: DX Code Needed PT IS WANTING TO SWITCH TEST STRIPS AND LANCETS FROM MAIL ORDER. PLEASE SEND NEW SCRIPT WITH QUANTITY, SPECIFIC SIG, DIAGNOSIS CODE, AND PRESCRIBER NPI TO [id number]. THANKS."

## 2018-12-07 MED ORDER — LANCETS MISC: 1 refills | Status: DC

## 2018-12-07 MED ORDER — GLUCOSE BLOOD VI STRP: ORAL_STRIP | 1 refills | Status: DC

## 2018-12-07 NOTE — Telephone Encounter (Signed)
FYI  Pt needs all meds sent to [facility] for meds to be covered for now on.

## 2018-12-07 NOTE — Addendum Note (Signed)
Addended by: Kem Boroughs D on: 12/07/2018 12:06 PM   Modules accepted: Orders

## 2018-12-08 NOTE — Telephone Encounter (Signed)
Noted.  [facility] is now set as her pharmacy.

## 2018-12-12 ENCOUNTER — Telehealth: Payer: Self-pay | Admitting: *Deleted

## 2018-12-12 NOTE — Telephone Encounter (Signed)
Received Dermatopathology Report results from [facility]; forwarded to provider/SLS 02/10

## 2018-12-20 ENCOUNTER — Other Ambulatory Visit: Payer: Self-pay | Admitting: General Surgery

## 2018-12-20 DIAGNOSIS — R921 Mammographic calcification found on diagnostic imaging of breast: Secondary | ICD-10-CM

## 2018-12-20 DIAGNOSIS — C50011 Malignant neoplasm of nipple and areola, right female breast: Secondary | ICD-10-CM | POA: Diagnosis not present

## 2018-12-21 ENCOUNTER — Other Ambulatory Visit: Payer: Self-pay | Admitting: General Surgery

## 2018-12-21 DIAGNOSIS — R921 Mammographic calcification found on diagnostic imaging of breast: Secondary | ICD-10-CM

## 2018-12-21 DIAGNOSIS — C50011 Malignant neoplasm of nipple and areola, right female breast: Secondary | ICD-10-CM

## 2018-12-22 ENCOUNTER — Ambulatory Visit
Admission: RE | Admit: 2018-12-22 | Discharge: 2018-12-22 | Disposition: A | Discharge status: Home / Self Care | Payer: Medicare Other | Source: Ambulatory Visit | Attending: General Surgery | Admitting: General Surgery

## 2018-12-22 DIAGNOSIS — C50011 Malignant neoplasm of nipple and areola, right female breast: Secondary | ICD-10-CM

## 2018-12-22 DIAGNOSIS — R921 Mammographic calcification found on diagnostic imaging of breast: Secondary | ICD-10-CM

## 2018-12-22 DIAGNOSIS — D0511 Intraductal carcinoma in situ of right breast: Secondary | ICD-10-CM | POA: Diagnosis not present

## 2018-12-23 HISTORY — PX: BREAST BIOPSY: SHX20

## 2018-12-27 ENCOUNTER — Other Ambulatory Visit: Payer: Self-pay | Admitting: General Surgery

## 2018-12-27 DIAGNOSIS — D0511 Intraductal carcinoma in situ of right breast: Secondary | ICD-10-CM | POA: Diagnosis not present

## 2018-12-27 DIAGNOSIS — C50019 Malignant neoplasm of nipple and areola, unspecified female breast: Secondary | ICD-10-CM | POA: Diagnosis not present

## 2018-12-28 ENCOUNTER — Telehealth: Payer: Self-pay | Admitting: *Deleted

## 2018-12-28 NOTE — Telephone Encounter (Signed)
Received Dermatopathology Report results from [facility]; forwarded to provider/SLS 02/26     

## 2018-12-29 ENCOUNTER — Telehealth: Payer: Self-pay | Admitting: *Deleted

## 2018-12-29 DIAGNOSIS — D0511 Intraductal carcinoma in situ of right breast: Secondary | ICD-10-CM | POA: Diagnosis not present

## 2018-12-29 DIAGNOSIS — Z91040 Latex allergy status: Secondary | ICD-10-CM | POA: Diagnosis not present

## 2018-12-29 DIAGNOSIS — Z885 Allergy status to narcotic agent status: Secondary | ICD-10-CM | POA: Diagnosis not present

## 2018-12-29 DIAGNOSIS — Z880 Allergy status to penicillin: Secondary | ICD-10-CM | POA: Diagnosis not present

## 2018-12-29 NOTE — Telephone Encounter (Signed)
On 12-29-18 fax medical records to [facility] radiation oncology, it was consult note, end of tx note , sim and planning note

## 2018-12-30 ENCOUNTER — Other Ambulatory Visit: Payer: Self-pay | Admitting: General Surgery

## 2018-12-30 DIAGNOSIS — D0511 Intraductal carcinoma in situ of right breast: Secondary | ICD-10-CM

## 2019-01-03 ENCOUNTER — Telehealth: Payer: Self-pay | Admitting: *Deleted

## 2019-01-03 ENCOUNTER — Telehealth: Payer: Self-pay | Admitting: Oncology

## 2019-01-03 NOTE — Telephone Encounter (Signed)
Per 3/2 sch message.     Called patient to set up follow up appt - she does not believe that the appt is needed   She says if its something that Dr. Donne Hazel wants then she may come in but she states that she does not want to come in because she knows she's not getting chemo  and should only be getting radiation. Does not know why she needs to see Dr.Sherrill.  - message sent to MD and Nurse navigator

## 2019-01-03 NOTE — Telephone Encounter (Signed)
Called pt and informed since she is ER negative, she does not need to med onc at this time per Dr.Wakefield. Pt relate she did not think she needed to see med onc since she didn't need chemo. Encourage pt to call with needs. Received verbal understanding

## 2019-01-09 ENCOUNTER — Other Ambulatory Visit: Payer: Self-pay

## 2019-01-09 ENCOUNTER — Encounter (HOSPITAL_BASED_OUTPATIENT_CLINIC_OR_DEPARTMENT_OTHER): Payer: Self-pay | Admitting: *Deleted

## 2019-01-17 ENCOUNTER — Other Ambulatory Visit: Payer: Self-pay

## 2019-01-17 ENCOUNTER — Ambulatory Visit
Admission: RE | Admit: 2019-01-17 | Discharge: 2019-01-17 | Disposition: A | Discharge status: Home / Self Care | Payer: Medicare Other | Source: Ambulatory Visit | Attending: General Surgery | Admitting: General Surgery

## 2019-01-17 DIAGNOSIS — R928 Other abnormal and inconclusive findings on diagnostic imaging of breast: Secondary | ICD-10-CM | POA: Diagnosis not present

## 2019-01-17 DIAGNOSIS — D0511 Intraductal carcinoma in situ of right breast: Secondary | ICD-10-CM

## 2019-01-17 NOTE — Progress Notes (Signed)
PT in to pick up G2 and CHG. Instructions reviewed.

## 2019-01-18 ENCOUNTER — Encounter (HOSPITAL_BASED_OUTPATIENT_CLINIC_OR_DEPARTMENT_OTHER): Payer: Self-pay

## 2019-01-18 ENCOUNTER — Ambulatory Visit (HOSPITAL_BASED_OUTPATIENT_CLINIC_OR_DEPARTMENT_OTHER): Payer: Medicare Other | Admitting: Anesthesiology

## 2019-01-18 ENCOUNTER — Other Ambulatory Visit: Payer: Self-pay

## 2019-01-18 ENCOUNTER — Ambulatory Visit (HOSPITAL_BASED_OUTPATIENT_CLINIC_OR_DEPARTMENT_OTHER)
Admission: RE | Admit: 2019-01-18 | Discharge: 2019-01-18 | Disposition: A | Discharge status: Home / Self Care | Payer: Medicare Other | Attending: General Surgery | Admitting: General Surgery

## 2019-01-18 ENCOUNTER — Ambulatory Visit
Admission: RE | Admit: 2019-01-18 | Discharge: 2019-01-18 | Disposition: A | Discharge status: Home / Self Care | Payer: Medicare Other | Source: Ambulatory Visit | Attending: General Surgery | Admitting: General Surgery

## 2019-01-18 ENCOUNTER — Encounter (HOSPITAL_BASED_OUTPATIENT_CLINIC_OR_DEPARTMENT_OTHER)
Admission: RE | Disposition: A | Discharge status: Home / Self Care | Payer: Self-pay | Source: Home / Self Care | Attending: General Surgery

## 2019-01-18 DIAGNOSIS — C50911 Malignant neoplasm of unspecified site of right female breast: Secondary | ICD-10-CM | POA: Diagnosis present

## 2019-01-18 DIAGNOSIS — I10 Essential (primary) hypertension: Secondary | ICD-10-CM | POA: Insufficient documentation

## 2019-01-18 DIAGNOSIS — K219 Gastro-esophageal reflux disease without esophagitis: Secondary | ICD-10-CM | POA: Diagnosis not present

## 2019-01-18 DIAGNOSIS — Z8501 Personal history of malignant neoplasm of esophagus: Secondary | ICD-10-CM | POA: Diagnosis not present

## 2019-01-18 DIAGNOSIS — Z79899 Other long term (current) drug therapy: Secondary | ICD-10-CM | POA: Diagnosis not present

## 2019-01-18 DIAGNOSIS — Z87891 Personal history of nicotine dependence: Secondary | ICD-10-CM | POA: Insufficient documentation

## 2019-01-18 DIAGNOSIS — E785 Hyperlipidemia, unspecified: Secondary | ICD-10-CM | POA: Insufficient documentation

## 2019-01-18 DIAGNOSIS — G8918 Other acute postprocedural pain: Secondary | ICD-10-CM | POA: Diagnosis not present

## 2019-01-18 DIAGNOSIS — D0511 Intraductal carcinoma in situ of right breast: Secondary | ICD-10-CM | POA: Diagnosis not present

## 2019-01-18 DIAGNOSIS — Z171 Estrogen receptor negative status [ER-]: Secondary | ICD-10-CM | POA: Insufficient documentation

## 2019-01-18 DIAGNOSIS — C50011 Malignant neoplasm of nipple and areola, right female breast: Secondary | ICD-10-CM | POA: Insufficient documentation

## 2019-01-18 HISTORY — DX: Malignant neoplasm of unspecified site of unspecified female breast: C50.919

## 2019-01-18 HISTORY — PX: BREAST LUMPECTOMY WITH RADIOACTIVE SEED LOCALIZATION: SHX6424

## 2019-01-18 HISTORY — DX: Prediabetes: R73.03

## 2019-01-18 HISTORY — PX: BREAST LUMPECTOMY: SHX2

## 2019-01-18 SURGERY — BREAST LUMPECTOMY WITH RADIOACTIVE SEED LOCALIZATION
Anesthesia: General | Site: Breast | Laterality: Right

## 2019-01-18 MED ORDER — SCOPOLAMINE 1 MG/3DAYS TD PT72: 1.0000 | MEDICATED_PATCH | Freq: Once | TRANSDERMAL | Status: DC | PRN

## 2019-01-18 MED ORDER — SUGAMMADEX SODIUM 200 MG/2ML IV SOLN
INTRAVENOUS | Status: DC | PRN
  Administered 2019-01-18: 200 mg via INTRAVENOUS

## 2019-01-18 MED ORDER — BUPIVACAINE-EPINEPHRINE (PF) 0.25% -1:200000 IJ SOLN
INTRAMUSCULAR | Status: AC
  Filled 2019-01-18: qty 30

## 2019-01-18 MED ORDER — BACITRACIN ZINC 500 UNIT/GM EX OINT
TOPICAL_OINTMENT | CUTANEOUS | Status: AC
  Filled 2019-01-18: qty 28.35

## 2019-01-18 MED ORDER — FENTANYL CITRATE (PF) 100 MCG/2ML IJ SOLN
INTRAMUSCULAR | Status: AC
  Filled 2019-01-18: qty 2

## 2019-01-18 MED ORDER — SUGAMMADEX SODIUM 500 MG/5ML IV SOLN
INTRAVENOUS | Status: AC
  Filled 2019-01-18: qty 5

## 2019-01-18 MED ORDER — ROCURONIUM BROMIDE 50 MG/5ML IV SOSY
PREFILLED_SYRINGE | INTRAVENOUS | Status: AC
  Filled 2019-01-18: qty 5

## 2019-01-18 MED ORDER — ACETAMINOPHEN 500 MG PO TABS
1000.0000 mg | ORAL_TABLET | ORAL | Status: AC
  Administered 2019-01-18: 1000 mg via ORAL

## 2019-01-18 MED ORDER — MIDAZOLAM HCL 2 MG/2ML IJ SOLN
1.0000 mg | INTRAMUSCULAR | Status: DC | PRN
  Administered 2019-01-18: 1 mg via INTRAVENOUS

## 2019-01-18 MED ORDER — LIDOCAINE 2% (20 MG/ML) 5 ML SYRINGE
INTRAMUSCULAR | Status: AC
  Filled 2019-01-18: qty 5

## 2019-01-18 MED ORDER — BUPIVACAINE HCL (PF) 0.25 % IJ SOLN
INTRAMUSCULAR | Status: AC
  Filled 2019-01-18: qty 30

## 2019-01-18 MED ORDER — BUPIVACAINE HCL (PF) 0.25 % IJ SOLN
INTRAMUSCULAR | Status: DC | PRN
  Administered 2019-01-18: 8 mL

## 2019-01-18 MED ORDER — ACETAMINOPHEN 500 MG PO TABS
ORAL_TABLET | ORAL | Status: AC
  Filled 2019-01-18: qty 2

## 2019-01-18 MED ORDER — DEXAMETHASONE SODIUM PHOSPHATE 10 MG/ML IJ SOLN
INTRAMUSCULAR | Status: AC
  Filled 2019-01-18: qty 1

## 2019-01-18 MED ORDER — BUPIVACAINE-EPINEPHRINE (PF) 0.5% -1:200000 IJ SOLN
INTRAMUSCULAR | Status: DC | PRN
  Administered 2019-01-18: 30 mL via PERINEURAL

## 2019-01-18 MED ORDER — DIPHENHYDRAMINE HCL 50 MG/ML IJ SOLN
INTRAMUSCULAR | Status: DC | PRN
  Administered 2019-01-18: 6.25 mg via INTRAVENOUS

## 2019-01-18 MED ORDER — LIDOCAINE-EPINEPHRINE 1 %-1:100000 IJ SOLN
INTRAMUSCULAR | Status: AC
  Filled 2019-01-18: qty 2

## 2019-01-18 MED ORDER — MIDAZOLAM HCL 2 MG/2ML IJ SOLN
INTRAMUSCULAR | Status: AC
  Filled 2019-01-18: qty 2

## 2019-01-18 MED ORDER — SUCCINYLCHOLINE CHLORIDE 200 MG/10ML IV SOSY
PREFILLED_SYRINGE | INTRAVENOUS | Status: AC
  Filled 2019-01-18: qty 10

## 2019-01-18 MED ORDER — CIPROFLOXACIN IN D5W 400 MG/200ML IV SOLN
INTRAVENOUS | Status: AC
  Filled 2019-01-18: qty 200

## 2019-01-18 MED ORDER — GABAPENTIN 100 MG PO CAPS
ORAL_CAPSULE | ORAL | Status: AC
  Filled 2019-01-18: qty 1

## 2019-01-18 MED ORDER — PROPOFOL 10 MG/ML IV BOLUS
INTRAVENOUS | Status: DC | PRN
  Administered 2019-01-18: 150 mg via INTRAVENOUS

## 2019-01-18 MED ORDER — ONDANSETRON HCL 4 MG/2ML IJ SOLN
INTRAMUSCULAR | Status: AC
  Filled 2019-01-18: qty 4

## 2019-01-18 MED ORDER — FENTANYL CITRATE (PF) 100 MCG/2ML IJ SOLN
50.0000 ug | INTRAMUSCULAR | Status: DC | PRN
  Administered 2019-01-18 (×2): 50 ug via INTRAVENOUS

## 2019-01-18 MED ORDER — PROPOFOL 500 MG/50ML IV EMUL
INTRAVENOUS | Status: DC | PRN
  Administered 2019-01-18: 120 ug/kg/min via INTRAVENOUS

## 2019-01-18 MED ORDER — FENTANYL CITRATE (PF) 100 MCG/2ML IJ SOLN: 25.0000 ug | INTRAMUSCULAR | Status: DC | PRN

## 2019-01-18 MED ORDER — ONDANSETRON HCL 4 MG/2ML IJ SOLN
INTRAMUSCULAR | Status: DC | PRN
  Administered 2019-01-18 (×2): 4 mg via INTRAVENOUS

## 2019-01-18 MED ORDER — LACTATED RINGERS IV SOLN
INTRAVENOUS | Status: DC
  Administered 2019-01-18: 09:00:00 via INTRAVENOUS

## 2019-01-18 MED ORDER — PHENYLEPHRINE 40 MCG/ML (10ML) SYRINGE FOR IV PUSH (FOR BLOOD PRESSURE SUPPORT)
PREFILLED_SYRINGE | INTRAVENOUS | Status: AC
  Filled 2019-01-18: qty 10

## 2019-01-18 MED ORDER — GABAPENTIN 100 MG PO CAPS
100.0000 mg | ORAL_CAPSULE | ORAL | Status: AC
  Administered 2019-01-18: 100 mg via ORAL

## 2019-01-18 MED ORDER — DEXAMETHASONE SODIUM PHOSPHATE 4 MG/ML IJ SOLN
INTRAMUSCULAR | Status: DC | PRN
  Administered 2019-01-18: 8 mg via INTRAVENOUS

## 2019-01-18 MED ORDER — EPHEDRINE 5 MG/ML INJ
INTRAVENOUS | Status: AC
  Filled 2019-01-18: qty 10

## 2019-01-18 MED ORDER — ONDANSETRON HCL 4 MG/2ML IJ SOLN: 4.0000 mg | Freq: Once | INTRAMUSCULAR | Status: DC | PRN

## 2019-01-18 MED ORDER — ROCURONIUM BROMIDE 100 MG/10ML IV SOLN
INTRAVENOUS | Status: DC | PRN
  Administered 2019-01-18: 50 mg via INTRAVENOUS

## 2019-01-18 MED ORDER — CIPROFLOXACIN IN D5W 400 MG/200ML IV SOLN
400.0000 mg | INTRAVENOUS | Status: AC
  Administered 2019-01-18: 400 mg via INTRAVENOUS

## 2019-01-18 MED ORDER — PROPOFOL 10 MG/ML IV BOLUS
INTRAVENOUS | Status: AC
  Filled 2019-01-18: qty 20

## 2019-01-18 SURGICAL SUPPLY — 58 items
APPLIER CLIP 9.375 MED OPEN (MISCELLANEOUS)
BINDER BREAST LRG (GAUZE/BANDAGES/DRESSINGS) ×3 IMPLANT
BINDER BREAST MEDIUM (GAUZE/BANDAGES/DRESSINGS) IMPLANT
BINDER BREAST XLRG (GAUZE/BANDAGES/DRESSINGS) IMPLANT
BINDER BREAST XXLRG (GAUZE/BANDAGES/DRESSINGS) IMPLANT
BLADE SURG 15 STRL LF DISP TIS (BLADE) ×1 IMPLANT
BLADE SURG 15 STRL SS (BLADE) ×2
CANISTER SUC SOCK COL 7IN (MISCELLANEOUS) IMPLANT
CANISTER SUCT 1200ML W/VALVE (MISCELLANEOUS) IMPLANT
CHLORAPREP W/TINT 26 (MISCELLANEOUS) ×3 IMPLANT
CLIP APPLIE 9.375 MED OPEN (MISCELLANEOUS) IMPLANT
CLIP VESOCCLUDE SM WIDE 6/CT (CLIP) IMPLANT
CLOSURE WOUND 1/2 X4 (GAUZE/BANDAGES/DRESSINGS) ×1
COVER BACK TABLE 60X90IN (DRAPES) ×3 IMPLANT
COVER MAYO STAND STRL (DRAPES) ×3 IMPLANT
COVER PROBE W GEL 5X96 (DRAPES) ×3 IMPLANT
COVER WAND RF STERILE (DRAPES) IMPLANT
DECANTER SPIKE VIAL GLASS SM (MISCELLANEOUS) IMPLANT
DERMABOND ADVANCED (GAUZE/BANDAGES/DRESSINGS) ×2
DERMABOND ADVANCED .7 DNX12 (GAUZE/BANDAGES/DRESSINGS) ×1 IMPLANT
DRAPE LAPAROSCOPIC ABDOMINAL (DRAPES) ×3 IMPLANT
DRAPE UTILITY XL STRL (DRAPES) ×3 IMPLANT
DRSG TEGADERM 4X4.75 (GAUZE/BANDAGES/DRESSINGS) IMPLANT
ELECT COATED BLADE 2.86 ST (ELECTRODE) ×3 IMPLANT
ELECT REM PT RETURN 9FT ADLT (ELECTROSURGICAL) ×3
ELECTRODE REM PT RTRN 9FT ADLT (ELECTROSURGICAL) ×1 IMPLANT
GAUZE SPONGE 4X4 12PLY STRL LF (GAUZE/BANDAGES/DRESSINGS) IMPLANT
GLOVE BIO SURGEON STRL SZ7 (GLOVE) ×9 IMPLANT
GLOVE BIOGEL PI IND STRL 7.5 (GLOVE) ×1 IMPLANT
GLOVE BIOGEL PI INDICATOR 7.5 (GLOVE) ×2
GLOVE EXAM NITRILE MD LF STRL (GLOVE) ×3 IMPLANT
GOWN STRL REUS W/ TWL LRG LVL3 (GOWN DISPOSABLE) ×1 IMPLANT
GOWN STRL REUS W/TWL LRG LVL3 (GOWN DISPOSABLE) ×2
HEMOSTAT ARISTA ABSORB 3G PWDR (HEMOSTASIS) IMPLANT
ILLUMINATOR WAVEGUIDE N/F (MISCELLANEOUS) IMPLANT
KIT MARKER MARGIN INK (KITS) ×3 IMPLANT
LIGHT WAVEGUIDE WIDE FLAT (MISCELLANEOUS) IMPLANT
NEEDLE HYPO 25X1 1.5 SAFETY (NEEDLE) ×3 IMPLANT
NS IRRIG 1000ML POUR BTL (IV SOLUTION) IMPLANT
PACK BASIN DAY SURGERY FS (CUSTOM PROCEDURE TRAY) ×3 IMPLANT
PENCIL BUTTON HOLSTER BLD 10FT (ELECTRODE) ×3 IMPLANT
SLEEVE SCD COMPRESS KNEE MED (MISCELLANEOUS) ×3 IMPLANT
SPONGE LAP 4X18 RFD (DISPOSABLE) ×3 IMPLANT
STRIP CLOSURE SKIN 1/2X4 (GAUZE/BANDAGES/DRESSINGS) ×2 IMPLANT
SUT MNCRL AB 4-0 PS2 18 (SUTURE) ×6 IMPLANT
SUT MON AB 5-0 PS2 18 (SUTURE) IMPLANT
SUT SILK 2 0 SH (SUTURE) ×3 IMPLANT
SUT VIC AB 2-0 SH 27 (SUTURE) ×4
SUT VIC AB 2-0 SH 27XBRD (SUTURE) ×2 IMPLANT
SUT VIC AB 3-0 SH 27 (SUTURE) ×2
SUT VIC AB 3-0 SH 27X BRD (SUTURE) ×1 IMPLANT
SUT VIC AB 5-0 PS2 18 (SUTURE) IMPLANT
SYR CONTROL 10ML LL (SYRINGE) ×3 IMPLANT
TOWEL GREEN STERILE FF (TOWEL DISPOSABLE) ×3 IMPLANT
TRAY FAXITRON CT DISP (TRAY / TRAY PROCEDURE) ×3 IMPLANT
TUBE CONNECTING 20'X1/4 (TUBING)
TUBE CONNECTING 20X1/4 (TUBING) IMPLANT
YANKAUER SUCT BULB TIP NO VENT (SUCTIONS) IMPLANT

## 2019-01-18 NOTE — Op Note (Signed)
Preoperative diagnosis: Paget's disease, right breast ductal carcinoma in situ Postoperative diagnosis: Same as above Procedure: Right breast seed guided central lumpectomy Surgeon: Dr. Serita Grammes Anesthesia: General with pectoral block Estimated blood loss: Minimal Specimens: Right breast tissue including nipple and areola marked with paint Additional posterior margin marked short stitch superior, long stitch lateral, double stitch deep Drains: None Complications: None Special count was correct at completion Disposition to recovery stable condition  Indications: This is a 73 year old female who was diagnosed with Paget's disease at the dermatologist.  On her mammogram she had some additional calcifications dreaming laterally from the areola.  This underwent biopsy and was ductal carcinoma in situ.  I discussed a central lumpectomy as well as a seed guided excision of the posterior aspect of the calcifications with her.  Procedure: After informed consent was obtained the patient first underwent a pectoral block.  SCDs were in place.  Antibiotics were given.  She was placed under general anesthesia without complication.  Her breast was prepped and draped in the standard sterile surgical fashion.  A surgical timeout was then performed.  I made an elliptical incision encompassing the nipple areolar complex.  I then used cautery to dissect down and remove the entire nipple areolar complex as well as the tissue posterior to this I remove the radioactive seed which was lateral to the areola.  This was all passed off the table.  On the 3D images of the specimen I thought I was close posteriorly.  I removed some more posterior margin and now this is the muscle.  This was marked as above.  Hemostasis was obtained.  I closed this with 2-0 Vicryl, 3-0 Vicryl, and 4-0 Monocryl.  Glue and Steri-Strips were applied.  She tolerated this well was extubated and transferred to recovery stable.

## 2019-01-18 NOTE — H&P (Signed)
Briana Craig is an 73 y.o. female.   Chief Complaint: right breast cancer HPI:  1 yof referred by Dr Briana Craig for right sided pagets disease of the breast. she noted 2-3 months ago crusting of right nipple. no mass. no dc. she has hx of esophageal cancer in 2012 treated with surgery after primary chemo/rads. she is ned from this. she underwent mm/us. density is b. she has no abnormality in the left breast. on the right side there are calcifications in the lateral right breast retroareolar region. US shows no abnormality on right side subareolar and no ax lad. she underwent a shave biopsy with derm that shows pagets disease. I saw her last week and since then she has undergone biopsy of calcs in lateral retroareolar position on the side with pagets disease. this biopsy is HG er/pr neg dcis. the clip is immediately behind the nac.    Past Medical History:  Diagnosis Date  . Breast cancer ([place]) 12/2018   right DCIS  . Dysphagia   . Esophageal cancer ([place])   . GERD (gastroesophageal reflux disease)   . Hyperlipemia   . Hypertension    no meds now  . Impaired fasting blood sugar   . PONV (postoperative nausea and vomiting)   . Pre-diabetes     Past Surgical History:  Procedure Laterality Date  . DIRECT LARYNGOSCOPY WITH BOTOX INJECTION     tx paralysed vocal cord  . ESOPHAGOGASTRODUODENOSCOPY  11/17/2011   Procedure: ESOPHAGOGASTRODUODENOSCOPY (EGD);  Surgeon: Briana Loffler, MD;  Location: [facility];  Service: Endoscopy;  Laterality: N/A;  . ESOPHAGUS SURGERY    . EYE SURGERY     both cataracts  . GANGLION CYST EXCISION Left 01/26/2013   Procedure: LEFT FLEXOR CARPI RADIALIS  RELEASE AND SCAPHO TRAPEZIAL TRAPEZOID DEBRIDEMENT ;  Surgeon: Briana Must, MD;  Location: [facility];  Service: Orthopedics;  Laterality: Left;    Family History  Problem Relation Age of Onset  . Alcohol abuse Sister   . AAA (abdominal aortic aneurysm) Mother   . COPD Father    . Colon cancer Neg Hx    Social History:  reports that she quit smoking about 31 years ago. Her smoking use included cigarettes. She has never used smokeless tobacco. She reports that she does not drink alcohol or use drugs.  Allergies:  Allergies  Allergen Reactions  . Taxol [Paclitaxel] Hives and Itching  . Tramadol     "Sweaty, shaky and faint"  . Carboplatin Hives and Itching  . Codeine   . Compazine   . Compazine  [Prochlorperazine Maleate] Other (See Comments)    Severe chest pain  . Penicillins   . Prochlorperazine Edisylate     Other reaction(s): Other (See Comments) Severe chest pain    Medications Prior to Admission  Medication Sig Dispense Refill  . CALCIUM-VITAMIN D PO Take 600 mg by mouth daily.     . cetirizine (ZYRTEC) 10 MG tablet Take 10 mg by mouth as needed for allergies.    . famotidine (PEPCID) 20 MG tablet TAKE 1 TABLET (20 MG TOTAL)  Qd 90 tablet 3  . fluticasone (FLONASE) 50 MCG/ACT nasal spray Place 2 sprays into the nose daily as needed.     . latanoprost (XALATAN) 0.005 % ophthalmic solution Place 1 drop into both eyes daily.    . Multiple Vitamins-Minerals (MULTIVITAMIN PO) Take 1 tablet by mouth daily.     . Multiple Vitamins-Minerals (PRESERVISION AREDS) CAPS Take by mouth.    Marland Kitchen  Omega-3 Fatty Acids (FISH OIL PO) Take 1,200 mg by mouth daily.     . pantoprazole (PROTONIX) 40 MG tablet TAKE 1 TABLET DAILY (Patient taking differently: daily. ) 90 tablet 3  . simvastatin (ZOCOR) 20 MG tablet TAKE 1 TABLET DAILY AT 6 P.M. 90 tablet 4  . glucose blood (ONE TOUCH ULTRA TEST) test strip USE TO CHECK BLOOD SUGAR ONCE A DAY.  Dx Code E11.9 100 each 1  . Lancets MISC Use as directed once a day. DX code E11.9 100 each 1    No results found for this or any previous visit (from the past 48 hour(s)). Mm Rt Radioactive Seed Loc Mammo Guide  Result Date: 01/17/2019 CLINICAL DATA:  Localization prior to surgery EXAM: MAMMOGRAPHIC GUIDED RADIOACTIVE SEED  LOCALIZATION OF THE RIGHT BREAST COMPARISON:  Previous exam(s). FINDINGS: Patient presents for radioactive seed localization prior to surgery. I met with the patient and we discussed the procedure of seed localization including benefits and alternatives. We discussed the high likelihood of a successful procedure. We discussed the risks of the procedure including infection, bleeding, tissue injury and further surgery. We discussed the low dose of radioactivity involved in the procedure. Informed, written consent was given. The usual time-out protocol was performed immediately prior to the procedure. Using mammographic guidance, sterile technique, 1% lidocaine and an I-125 radioactive seed, the biopsy clip marking the patient's known malignancy was localized using a lateral approach. The follow-up mammogram images confirm the seed in the expected location and were marked for the surgeon. Follow-up survey of the patient confirms presence of the radioactive seed. Order number of I-125 seed:  [id number]. Total activity:  4.008 millicuries reference Date: January 10, 2019 The patient tolerated the procedure well and was released from the [place]. She was given instructions regarding seed removal. IMPRESSION: Radioactive seed localization right breast. No apparent complications. Electronically Signed   By: Briana Craig III M.D   On: 01/17/2019 13:32    ROS General Not Present- Appetite Loss, Chills, Fatigue, Fever, Night Sweats, Weight Gain and Weight Loss. Skin Not Present- Change in Wart/Mole, Dryness, Hives, Jaundice, New Lesions, Non-Healing Wounds, Rash and Ulcer. HEENT Present- Wears glasses/contact lenses. Not Present- Earache, Hearing Loss, Hoarseness, Nose Bleed, Oral Ulcers, Ringing in the Ears, Seasonal Allergies, Sinus Pain, Sore Throat, Visual Disturbances and Yellow Eyes. Respiratory Not Present- Bloody sputum, Chronic Cough, Difficulty Breathing, Snoring and Wheezing. Cardiovascular Not  Present- Chest Pain, Difficulty Breathing Lying Down, Leg Cramps, Palpitations, Rapid Heart Rate, Shortness of Breath and Swelling of Extremities. Gastrointestinal Not Present- Abdominal Pain, Bloating, Bloody Stool, Change in Bowel Habits, Chronic diarrhea, Constipation, Difficulty Swallowing, Excessive gas, Gets full quickly at meals, Hemorrhoids, Indigestion, Nausea, Rectal Pain and Vomiting. Female Genitourinary Not Present- Frequency, Nocturia, Painful Urination, Pelvic Pain and Urgency. Musculoskeletal Not Present- Back Pain, Joint Pain, Joint Stiffness, Muscle Pain, Muscle Weakness and Swelling of Extremities. Neurological Not Present- Decreased Memory, Fainting, Headaches, Numbness, Seizures, Tingling, Tremor, Trouble walking and Weakness. Psychiatric Not Present- Anxiety, Bipolar, Change in Sleep Pattern, Depression, Fearful and Frequent crying. Endocrine Not Present- Cold Intolerance, Excessive Hunger, Hair Changes, Heat Intolerance, Hot flashes and New Diabetes. Hematology Not Present- Blood Thinners, Easy Bruising, Excessive bleeding, Gland problems, HIV and Persistent Infections.  Blood pressure (!) 144/93, pulse 95, temperature 97.8 F (36.6 C), temperature source Oral, resp. rate 15, height 5' 2.5" (1.588 m), weight 57.8 kg, SpO2 100 %. Physical Exam  Physical Exam  General Mental Status-Alert. Orientation-Oriented X3. Breast Note: right nipple with  erythema and ulcerated, no underlying breast mass or dc cv rrr Lungs clear  Assessment/Plan DUCTAL CARCINOMA IN SITU (DCIS) OF RIGHT BREAST (D05.11) PAGET'S CARCINOMA OF THE NIPPLE (C50.019) Story: central lumpectomy vs mastectomy I recommended central lumpectomy with seed placement to ensure removal of calcs. we discussed no need for sn biopsy at this time. we discussed positive margin risk and need for radiotherapy postop. she is due to see Dr Pablo Ledger to make sure can receive radiotherapy. we discussed risks and recovery  after surgery as well. I will await rad onc appt and then schedule  Rolm Bookbinder, MD 01/18/2019, 10:20 AM

## 2019-01-18 NOTE — Transfer of Care (Signed)
Immediate Anesthesia Transfer of Care Note  Patient: Briana Craig  Procedure(s) Performed: RIGHT BREAST CENTRAL LUMPECTOMY WITH RADIOACTIVE SEED LOCALIZATION (Right Breast)  Patient Location: PACU  Anesthesia Type:GA combined with regional for post-op pain  Level of Consciousness: awake, alert  and oriented  Airway & Oxygen Therapy: Patient Spontanous Breathing and Patient connected to face mask oxygen  Post-op Assessment: Report given to RN and Post -op Vital signs reviewed and stable  Post vital signs: Reviewed and stable  Last Vitals:  Vitals Value Taken Time  BP 115/64 01/18/2019 11:36 AM  Temp    Pulse 89 01/18/2019 11:38 AM  Resp 23 01/18/2019 11:38 AM  SpO2 100 % 01/18/2019 11:38 AM  Vitals shown include unvalidated device data.  Last Pain:  Vitals:   01/18/19 0858  TempSrc: Oral  PainSc: 0-No pain         Complications: No apparent anesthesia complications

## 2019-01-18 NOTE — Interval H&P Note (Signed)
History and Physical Interval Note:  01/18/2019 10:22 AM  Briana Craig  has presented today for surgery, with the diagnosis of RIGHT BREAST CANCER.  The various methods of treatment have been discussed with the patient and family. After consideration of risks, benefits and other options for treatment, the patient has consented to  Procedure(s): RIGHT BREAST CENTRAL LUMPECTOMY WITH RADIOACTIVE SEED LOCALIZATION (Right) as a surgical intervention.  The patient's history has been reviewed, patient examined, no change in status, stable for surgery.  I have reviewed the patient's chart and labs.  Questions were answered to the patient's satisfaction.     Rolm Bookbinder

## 2019-01-18 NOTE — Anesthesia Procedure Notes (Signed)

## 2019-01-18 NOTE — Progress Notes (Signed)
Assisted Dr. Gifford Shave with right, ultrasound guided, pectoralis block. Side rails up, monitors on throughout procedure. See vital signs in flow sheet. Tolerated Procedure well.

## 2019-01-18 NOTE — Interval H&P Note (Signed)
History and Physical Interval Note:  01/18/2019 10:25 AM  Briana Craig  has presented today for surgery, with the diagnosis of RIGHT BREAST CANCER.  The various methods of treatment have been discussed with the patient and family. After consideration of risks, benefits and other options for treatment, the patient has consented to  Procedure(s): RIGHT BREAST CENTRAL LUMPECTOMY WITH RADIOACTIVE SEED LOCALIZATION (Right) as a surgical intervention.  The patient's history has been reviewed, patient examined, no change in status, stable for surgery.  I have reviewed the patient's chart and labs.  Questions were answered to the patient's satisfaction.     Rolm Bookbinder

## 2019-01-18 NOTE — Anesthesia Procedure Notes (Signed)
Anesthesia Regional Block: Pectoralis block   Pre-Anesthetic Checklist: ,, timeout performed, Correct Patient, Correct Site, Correct Laterality, Correct Procedure, Correct Position, site marked, Risks and benefits discussed,  Surgical consent,  Pre-op evaluation,  At surgeon's request and post-op pain management  Laterality: Right  Prep: chloraprep       Needles:  Injection technique: Single-shot  Needle Type: Echogenic Needle     Needle Length: 9cm  Needle Gauge: 21     Additional Needles:   Procedures:,,,, ultrasound used (permanent image in chart),,,,  Narrative:  Start time: 01/18/2019 10:08 AM End time: 01/18/2019 10:15 AM Injection made incrementally with aspirations every 5 mL.  Performed by: Personally  Anesthesiologist: Catalina Gravel, MD  Additional Notes: No pain on injection. No increased resistance to injection. Injection made in 5cc increments.  Good needle visualization.  Patient tolerated procedure well.

## 2019-01-18 NOTE — Anesthesia Preprocedure Evaluation (Addendum)
Anesthesia Evaluation  Patient identified by MRN, date of birth, ID band Patient awake    Reviewed: Allergy & Precautions, NPO status , Patient's Chart, lab work & pertinent test results  History of Anesthesia Complications (+) PONV and history of anesthetic complications  Airway Mallampati: II  TM Distance: >3 FB Neck ROM: Full    Dental  (+) Dental Advisory Given, Partial Upper   Pulmonary former smoker,    Pulmonary exam normal breath sounds clear to auscultation       Cardiovascular hypertension, Normal cardiovascular exam Rhythm:Regular Rate:Normal     Neuro/Psych negative neurological ROS     GI/Hepatic Neg liver ROS, GERD  Medicated and Controlled,  Endo/Other  diabetes  Renal/GU negative Renal ROS     Musculoskeletal negative musculoskeletal ROS (+)   Abdominal   Peds  Hematology negative hematology ROS (+)   Anesthesia Other Findings Day of surgery medications reviewed with the patient.  Esophageal cancer   Reproductive/Obstetrics                            Anesthesia Physical Anesthesia Plan  ASA: II  Anesthesia Plan: General   Post-op Pain Management:  Regional for Post-op pain   Induction: Intravenous  PONV Risk Score and Plan: 4 or greater and Dexamethasone, Ondansetron, Treatment may vary due to age or medical condition and TIVA  Airway Management Planned: Oral ETT  Additional Equipment:   Intra-op Plan:   Post-operative Plan: Extubation in OR  Informed Consent: I have reviewed the patients History and Physical, chart, labs and discussed the procedure including the risks, benefits and alternatives for the proposed anesthesia with the patient or authorized representative who has indicated his/her understanding and acceptance.     Dental advisory given  Plan Discussed with: CRNA  Anesthesia Plan Comments:       Anesthesia Quick Evaluation

## 2019-01-18 NOTE — Anesthesia Postprocedure Evaluation (Signed)
Anesthesia Post Note  Patient: Briana Craig  Procedure(s) Performed: RIGHT BREAST CENTRAL LUMPECTOMY WITH RADIOACTIVE SEED LOCALIZATION (Right Breast)     Patient location during evaluation: PACU Anesthesia Type: General Level of consciousness: awake and alert, awake and oriented Pain management: pain level controlled Vital Signs Assessment: post-procedure vital signs reviewed and stable Respiratory status: spontaneous breathing, nonlabored ventilation, respiratory function stable and patient connected to nasal cannula oxygen Cardiovascular status: blood pressure returned to baseline and stable Postop Assessment: no apparent nausea or vomiting Anesthetic complications: no    Last Vitals:  Vitals:   01/18/19 1230 01/18/19 1250  BP: 129/78 (!) 148/89  Pulse: 91 93  Resp: 15 18  Temp:  36.6 C  SpO2: 98% 100%    Last Pain:  Vitals:   01/18/19 1250  TempSrc:   PainSc: 0-No pain                 Catalina Gravel

## 2019-01-18 NOTE — Discharge Instructions (Signed)
°Post Anesthesia Home Care Instructions ° °Activity: °Get plenty of rest for the remainder of the day. A responsible individual must stay with you for 24 hours following the procedure.  °For the next 24 hours, DO NOT: °-Drive a car °-Operate machinery °-Drink alcoholic beverages °-Take any medication unless instructed by your physician °-Make any legal decisions or sign important papers. ° °Meals: °Start with liquid foods such as gelatin or soup. Progress to regular foods as tolerated. Avoid greasy, spicy, heavy foods. If nausea and/or vomiting occur, drink only clear liquids until the nausea and/or vomiting subsides. Call your physician if vomiting continues. ° °Special Instructions/Symptoms: °Your throat may feel dry or sore from the anesthesia or the breathing tube placed in your throat during surgery. If this causes discomfort, gargle with warm salt water. The discomfort should disappear within 24 hours. ° °If you had a scopolamine patch placed behind your ear for the management of post- operative nausea and/or vomiting: ° °1. The medication in the patch is effective for 72 hours, after which it should be removed.  Wrap patch in a tissue and discard in the trash. Wash hands thoroughly with soap and water. °2. You may remove the patch earlier than 72 hours if you experience unpleasant side effects which may include dry mouth, dizziness or visual disturbances. °3. Avoid touching the patch. Wash your hands with soap and water after contact with the patch. °   ° ° ° °[facility] °Office Phone Number [phone] °POST OP INSTRUCTIONS °Take 400 mg of ibuprofen every 8 hours or 650 mg tylenol every 6 hours for next 72 hours then as needed. Use ice several times daily also. °Always review your discharge instruction sheet given to you by the facility where your surgery was performed. ° °IF YOU HAVE DISABILITY OR FAMILY LEAVE FORMS, YOU MUST BRING THEM TO THE OFFICE FOR PROCESSING.  DO NOT GIVE THEM TO  YOUR DOCTOR. ° °1. A prescription for pain medication may be given to you upon discharge.  Take your pain medication as prescribed, if needed.  If narcotic pain medicine is not needed, then you may take acetaminophen (Tylenol), naprosyn (Alleve) or ibuprofen (Advil) as needed. °2. Take your usually prescribed medications unless otherwise directed °3. If you need a refill on your pain medication, please contact your pharmacy.  They will contact our office to request authorization.  Prescriptions will not be filled after 5pm or on week-ends. °4. You should eat very light the first 24 hours after surgery, such as soup, crackers, pudding, etc.  Resume your normal diet the day after surgery. °5. Most patients will experience some swelling and bruising in the breast.  Ice packs and a good support bra will help.  Wear the breast binder provided or a sports bra for 72 hours day and night.  After that wear a sports bra during the day until you return to the office. Swelling and bruising can take several days to resolve.  °6. It is common to experience some constipation if taking pain medication after surgery.  Increasing fluid intake and taking a stool softener will usually help or prevent this problem from occurring.  A mild laxative (Milk of Magnesia or Miralax) should be taken according to package directions if there are no bowel movements after 48 hours. °7. Unless discharge instructions indicate otherwise, you may remove your bandages 48 hours after surgery and you may shower at that time.  You may have steri-strips (small skin tapes) in place directly over the   incision.  These strips should be left on the skin for 7-10 days and will come off on their own.  If your surgeon used skin glue on the incision, you may shower in 24 hours.  The glue will flake off over the next 2-3 weeks.  Any sutures or staples will be removed at the office during your follow-up visit. °8. ACTIVITIES:  You may resume regular daily activities  (gradually increasing) beginning the next day.  Wearing a good support bra or sports bra minimizes pain and swelling.  You may have sexual intercourse when it is comfortable. °a. You may drive when you no longer are taking prescription pain medication, you can comfortably wear a seatbelt, and you can safely maneuver your car and apply brakes. °b. RETURN TO WORK:  ______________________________________________________________________________________ °9. You should see your doctor in the office for a follow-up appointment approximately two weeks after your surgery.  Your doctor’s nurse will typically make your follow-up appointment when she calls you with your pathology report.  Expect your pathology report 3-4 business days after your surgery.  You may call to check if you do not hear from us after three days. °10. OTHER INSTRUCTIONS: _______________________________________________________________________________________________ _____________________________________________________________________________________________________________________________________ °_____________________________________________________________________________________________________________________________________ °_____________________________________________________________________________________________________________________________________ ° °WHEN TO CALL DR WAKEFIELD: °1. Fever over 101.0 °2. Nausea and/or vomiting. °3. Extreme swelling or bruising. °4. Continued bleeding from incision. °5. Increased pain, redness, or drainage from the incision. ° °The clinic staff is available to answer your questions during regular business hours.  Please don’t hesitate to call and ask to speak to one of the nurses for clinical concerns.  If you have a medical emergency, go to the nearest emergency room or call 911.  A surgeon from [facility] is always on call at the hospital. ° °For further questions, please visit  [email or web address] ° °

## 2019-01-19 NOTE — Addendum Note (Signed)
Addendum  created 01/19/19 0706 by Willa Frater, CRNA   Charge Capture section accepted

## 2019-01-20 ENCOUNTER — Encounter (HOSPITAL_BASED_OUTPATIENT_CLINIC_OR_DEPARTMENT_OTHER): Payer: Self-pay | Admitting: General Surgery

## 2019-02-20 ENCOUNTER — Other Ambulatory Visit: Payer: Medicare Other

## 2019-03-08 DIAGNOSIS — D0511 Intraductal carcinoma in situ of right breast: Secondary | ICD-10-CM | POA: Diagnosis not present

## 2019-03-08 DIAGNOSIS — Z510 Encounter for antineoplastic radiation therapy: Secondary | ICD-10-CM | POA: Diagnosis not present

## 2019-03-15 DIAGNOSIS — D0511 Intraductal carcinoma in situ of right breast: Secondary | ICD-10-CM | POA: Diagnosis not present

## 2019-03-15 DIAGNOSIS — Z510 Encounter for antineoplastic radiation therapy: Secondary | ICD-10-CM | POA: Diagnosis not present

## 2019-03-16 DIAGNOSIS — D0511 Intraductal carcinoma in situ of right breast: Secondary | ICD-10-CM | POA: Diagnosis not present

## 2019-03-16 DIAGNOSIS — Z510 Encounter for antineoplastic radiation therapy: Secondary | ICD-10-CM | POA: Diagnosis not present

## 2019-03-20 DIAGNOSIS — Z510 Encounter for antineoplastic radiation therapy: Secondary | ICD-10-CM | POA: Diagnosis not present

## 2019-03-20 DIAGNOSIS — D0511 Intraductal carcinoma in situ of right breast: Secondary | ICD-10-CM | POA: Diagnosis not present

## 2019-03-21 DIAGNOSIS — D0511 Intraductal carcinoma in situ of right breast: Secondary | ICD-10-CM | POA: Diagnosis not present

## 2019-03-21 DIAGNOSIS — Z510 Encounter for antineoplastic radiation therapy: Secondary | ICD-10-CM | POA: Diagnosis not present

## 2019-03-22 DIAGNOSIS — Z510 Encounter for antineoplastic radiation therapy: Secondary | ICD-10-CM | POA: Diagnosis not present

## 2019-03-22 DIAGNOSIS — D0511 Intraductal carcinoma in situ of right breast: Secondary | ICD-10-CM | POA: Diagnosis not present

## 2019-03-23 DIAGNOSIS — D0511 Intraductal carcinoma in situ of right breast: Secondary | ICD-10-CM | POA: Diagnosis not present

## 2019-03-23 DIAGNOSIS — Z510 Encounter for antineoplastic radiation therapy: Secondary | ICD-10-CM | POA: Diagnosis not present

## 2019-03-24 DIAGNOSIS — D0511 Intraductal carcinoma in situ of right breast: Secondary | ICD-10-CM | POA: Diagnosis not present

## 2019-03-24 DIAGNOSIS — Z510 Encounter for antineoplastic radiation therapy: Secondary | ICD-10-CM | POA: Diagnosis not present

## 2019-03-28 DIAGNOSIS — Z510 Encounter for antineoplastic radiation therapy: Secondary | ICD-10-CM | POA: Diagnosis not present

## 2019-03-28 DIAGNOSIS — D0511 Intraductal carcinoma in situ of right breast: Secondary | ICD-10-CM | POA: Diagnosis not present

## 2019-03-29 DIAGNOSIS — Z510 Encounter for antineoplastic radiation therapy: Secondary | ICD-10-CM | POA: Diagnosis not present

## 2019-03-29 DIAGNOSIS — D0511 Intraductal carcinoma in situ of right breast: Secondary | ICD-10-CM | POA: Diagnosis not present

## 2019-03-30 DIAGNOSIS — D0511 Intraductal carcinoma in situ of right breast: Secondary | ICD-10-CM | POA: Diagnosis not present

## 2019-03-30 DIAGNOSIS — Z510 Encounter for antineoplastic radiation therapy: Secondary | ICD-10-CM | POA: Diagnosis not present

## 2019-03-31 DIAGNOSIS — Z510 Encounter for antineoplastic radiation therapy: Secondary | ICD-10-CM | POA: Diagnosis not present

## 2019-03-31 DIAGNOSIS — D0511 Intraductal carcinoma in situ of right breast: Secondary | ICD-10-CM | POA: Diagnosis not present

## 2019-04-03 DIAGNOSIS — D0511 Intraductal carcinoma in situ of right breast: Secondary | ICD-10-CM | POA: Diagnosis not present

## 2019-04-03 DIAGNOSIS — Z510 Encounter for antineoplastic radiation therapy: Secondary | ICD-10-CM | POA: Diagnosis not present

## 2019-04-04 DIAGNOSIS — D0511 Intraductal carcinoma in situ of right breast: Secondary | ICD-10-CM | POA: Diagnosis not present

## 2019-04-04 DIAGNOSIS — Z510 Encounter for antineoplastic radiation therapy: Secondary | ICD-10-CM | POA: Diagnosis not present

## 2019-04-05 DIAGNOSIS — D0511 Intraductal carcinoma in situ of right breast: Secondary | ICD-10-CM | POA: Diagnosis not present

## 2019-04-05 DIAGNOSIS — Z510 Encounter for antineoplastic radiation therapy: Secondary | ICD-10-CM | POA: Diagnosis not present

## 2019-04-06 DIAGNOSIS — Z510 Encounter for antineoplastic radiation therapy: Secondary | ICD-10-CM | POA: Diagnosis not present

## 2019-04-06 DIAGNOSIS — D0511 Intraductal carcinoma in situ of right breast: Secondary | ICD-10-CM | POA: Diagnosis not present

## 2019-04-07 DIAGNOSIS — D0511 Intraductal carcinoma in situ of right breast: Secondary | ICD-10-CM | POA: Diagnosis not present

## 2019-04-07 DIAGNOSIS — Z510 Encounter for antineoplastic radiation therapy: Secondary | ICD-10-CM | POA: Diagnosis not present

## 2019-04-10 DIAGNOSIS — D0511 Intraductal carcinoma in situ of right breast: Secondary | ICD-10-CM | POA: Diagnosis not present

## 2019-04-10 DIAGNOSIS — Z510 Encounter for antineoplastic radiation therapy: Secondary | ICD-10-CM | POA: Diagnosis not present

## 2019-04-11 DIAGNOSIS — Z510 Encounter for antineoplastic radiation therapy: Secondary | ICD-10-CM | POA: Diagnosis not present

## 2019-04-11 DIAGNOSIS — D0511 Intraductal carcinoma in situ of right breast: Secondary | ICD-10-CM | POA: Diagnosis not present

## 2019-04-12 DIAGNOSIS — Z510 Encounter for antineoplastic radiation therapy: Secondary | ICD-10-CM | POA: Diagnosis not present

## 2019-04-12 DIAGNOSIS — D0511 Intraductal carcinoma in situ of right breast: Secondary | ICD-10-CM | POA: Diagnosis not present

## 2019-04-13 DIAGNOSIS — D0511 Intraductal carcinoma in situ of right breast: Secondary | ICD-10-CM | POA: Diagnosis not present

## 2019-04-13 DIAGNOSIS — Z510 Encounter for antineoplastic radiation therapy: Secondary | ICD-10-CM | POA: Diagnosis not present

## 2019-04-14 DIAGNOSIS — Z510 Encounter for antineoplastic radiation therapy: Secondary | ICD-10-CM | POA: Diagnosis not present

## 2019-04-14 DIAGNOSIS — D0511 Intraductal carcinoma in situ of right breast: Secondary | ICD-10-CM | POA: Diagnosis not present

## 2019-04-17 DIAGNOSIS — D0511 Intraductal carcinoma in situ of right breast: Secondary | ICD-10-CM | POA: Diagnosis not present

## 2019-04-17 DIAGNOSIS — Z510 Encounter for antineoplastic radiation therapy: Secondary | ICD-10-CM | POA: Diagnosis not present

## 2019-05-14 ENCOUNTER — Other Ambulatory Visit: Payer: Self-pay | Admitting: Family Medicine

## 2019-05-14 DIAGNOSIS — K219 Gastro-esophageal reflux disease without esophagitis: Secondary | ICD-10-CM

## 2019-05-17 NOTE — Telephone Encounter (Signed)
Pt calling to f/up on this request.  °

## 2019-05-22 ENCOUNTER — Ambulatory Visit (INDEPENDENT_AMBULATORY_CARE_PROVIDER_SITE_OTHER): Payer: Medicare Other | Admitting: Family Medicine

## 2019-05-22 ENCOUNTER — Encounter: Payer: Self-pay | Admitting: Family Medicine

## 2019-05-22 ENCOUNTER — Other Ambulatory Visit: Payer: Self-pay

## 2019-05-22 VITALS — BP 134/84 | HR 89 | Temp 98.0°F | Resp 18 | Ht 62.5 in | Wt 125.0 lb

## 2019-05-22 DIAGNOSIS — D0511 Intraductal carcinoma in situ of right breast: Secondary | ICD-10-CM | POA: Diagnosis not present

## 2019-05-22 DIAGNOSIS — E785 Hyperlipidemia, unspecified: Secondary | ICD-10-CM | POA: Diagnosis not present

## 2019-05-22 DIAGNOSIS — E119 Type 2 diabetes mellitus without complications: Secondary | ICD-10-CM | POA: Diagnosis not present

## 2019-05-22 DIAGNOSIS — E1169 Type 2 diabetes mellitus with other specified complication: Secondary | ICD-10-CM | POA: Diagnosis not present

## 2019-05-22 DIAGNOSIS — I10 Essential (primary) hypertension: Secondary | ICD-10-CM | POA: Insufficient documentation

## 2019-05-22 LAB — COMPREHENSIVE METABOLIC PANEL
ALT: 17 U/L (ref 0–35)
AST: 21 U/L (ref 0–37)
Albumin: 4.3 g/dL (ref 3.5–5.2)
Alkaline Phosphatase: 48 U/L (ref 39–117)
BUN: 23 mg/dL (ref 6–23)
CO2: 29 mEq/L (ref 19–32)
Calcium: 9.5 mg/dL (ref 8.4–10.5)
Chloride: 103 mEq/L (ref 96–112)
Creatinine, Ser: 1.11 mg/dL (ref 0.40–1.20)
GFR: 47.97 mL/min — ABNORMAL LOW (ref >60.00)
Glucose, Bld: 126 mg/dL — ABNORMAL HIGH (ref 70–99)
Potassium: 4.1 mEq/L (ref 3.5–5.1)
Sodium: 140 mEq/L (ref 135–145)
Total Bilirubin: 0.6 mg/dL (ref 0.2–1.2)
Total Protein: 7 g/dL (ref 6.0–8.3)

## 2019-05-22 LAB — HEMOGLOBIN A1C: Hgb A1c MFr Bld: 6.8 % — ABNORMAL HIGH (ref 4.6–6.5)

## 2019-05-22 LAB — LIPID PANEL
Cholesterol: 191 mg/dL (ref 0–200)
HDL: 83.2 mg/dL (ref >39.00)
LDL Cholesterol: 90 mg/dL (ref 0–99)
NonHDL: 107.78
Total CHOL/HDL Ratio: 2
Triglycerides: 87 mg/dL (ref 0.0–149.0)
VLDL: 17.4 mg/dL (ref 0.0–40.0)

## 2019-05-22 LAB — MICROALBUMIN / CREATININE URINE RATIO
Creatinine,U: 117.4 mg/dL
Microalb Creat Ratio: 0.6 mg/g (ref 0.0–30.0)
Microalb, Ur: 0.8 mg/dL (ref 0.0–1.9)

## 2019-05-22 NOTE — Patient Instructions (Signed)
Carbohydrate Counting for Diabetes Mellitus, Adult  Carbohydrate counting is a method of keeping track of how many carbohydrates you eat. Eating carbohydrates naturally increases the amount of sugar (glucose) in the blood. Counting how many carbohydrates you eat helps keep your blood glucose within normal limits, which helps you manage your diabetes (diabetes mellitus). It is important to know how many carbohydrates you can safely have in each meal. This is different for every person. A diet and nutrition specialist (registered dietitian) can help you make a meal plan and calculate how many carbohydrates you should have at each meal and snack. Carbohydrates are found in the following foods:  Grains, such as breads and cereals.  Dried beans and soy products.  Starchy vegetables, such as potatoes, peas, and corn.  Fruit and fruit juices.  Milk and yogurt.  Sweets and snack foods, such as cake, cookies, candy, chips, and soft drinks. How do I count carbohydrates? There are two ways to count carbohydrates in food. You can use either of the methods or a combination of both. Reading "Nutrition Facts" on packaged food The "Nutrition Facts" list is included on the labels of almost all packaged foods and beverages in the [place] It includes:  The serving size.  Information about nutrients in each serving, including the grams (g) of carbohydrate per serving. To use the "Nutrition Facts":  Decide how many servings you will have.  Multiply the number of servings by the number of carbohydrates per serving.  The resulting number is the total amount of carbohydrates that you will be having. Learning standard serving sizes of other foods When you eat carbohydrate foods that are not packaged or do not include "Nutrition Facts" on the label, you need to measure the servings in order to count the amount of carbohydrates:  Measure the foods that you will eat with a food scale or measuring cup, if needed.   Decide how many standard-size servings you will eat.  Multiply the number of servings by 15. Most carbohydrate-rich foods have about 15 g of carbohydrates per serving. ? For example, if you eat 8 oz (170 g) of strawberries, you will have eaten 2 servings and 30 g of carbohydrates (2 servings x 15 g = 30 g).  For foods that have more than one food mixed, such as soups and casseroles, you must count the carbohydrates in each food that is included. The following list contains standard serving sizes of common carbohydrate-rich foods. Each of these servings has about 15 g of carbohydrates:   hamburger bun or  English muffin.   oz (15 mL) syrup.   oz (14 g) jelly.  1 slice of bread.  1 six-inch tortilla.  3 oz (85 g) cooked rice or pasta.  4 oz (113 g) cooked dried beans.  4 oz (113 g) starchy vegetable, such as peas, corn, or potatoes.  4 oz (113 g) hot cereal.  4 oz (113 g) mashed potatoes or  of a large baked potato.  4 oz (113 g) canned or frozen fruit.  4 oz (120 mL) fruit juice.  4-6 crackers.  6 chicken nuggets.  6 oz (170 g) unsweetened dry cereal.  6 oz (170 g) plain fat-free yogurt or yogurt sweetened with artificial sweeteners.  8 oz (240 mL) milk.  8 oz (170 g) fresh fruit or one small piece of fruit.  24 oz (680 g) popped popcorn. Example of carbohydrate counting Sample meal  3 oz (85 g) chicken breast.  6 oz (170 g)   brown rice.  4 oz (113 g) corn.  8 oz (240 mL) milk.  8 oz (170 g) strawberries with sugar-free whipped topping. Carbohydrate calculation 1. Identify the foods that contain carbohydrates: ? Rice. ? Corn. ? Milk. ? Strawberries. 2. Calculate how many servings you have of each food: ? 2 servings rice. ? 1 serving corn. ? 1 serving milk. ? 1 serving strawberries. 3. Multiply each number of servings by 15 g: ? 2 servings rice x 15 g = 30 g. ? 1 serving corn x 15 g = 15 g. ? 1 serving milk x 15 g = 15 g. ? 1 serving  strawberries x 15 g = 15 g. 4. Add together all of the amounts to find the total grams of carbohydrates eaten: ? 30 g + 15 g + 15 g + 15 g = 75 g of carbohydrates total. Summary  Carbohydrate counting is a method of keeping track of how many carbohydrates you eat.  Eating carbohydrates naturally increases the amount of sugar (glucose) in the blood.  Counting how many carbohydrates you eat helps keep your blood glucose within normal limits, which helps you manage your diabetes.  A diet and nutrition specialist (registered dietitian) can help you make a meal plan and calculate how many carbohydrates you should have at each meal and snack. This information is not intended to replace advice given to you by your health care provider. Make sure you discuss any questions you have with your health care provider. Document Released: 10/19/2005 Document Revised: 05/13/2017 Document Reviewed: 04/01/2016 Elsevier Patient Education  2020 [employer]  

## 2019-05-22 NOTE — Assessment & Plan Note (Signed)
.  sbhtn 

## 2019-05-22 NOTE — Assessment & Plan Note (Signed)
con't diet  Check labs 

## 2019-05-22 NOTE — Assessment & Plan Note (Signed)
S/p radiation  F/u oncology

## 2019-05-22 NOTE — Progress Notes (Signed)
Patient ID: Briana Craig, female    DOB: [birth year]  Age: 73 y.o. MRN: [id number]    Subjective:  Subjective  HPI Briana Craig presents for f/u dm , chol and htn.  No complaints.    HPI HYPERTENSION   Blood pressure range-not checking   Chest pain- no      Dyspnea- no Lightheadedness- no   Edema- no  Other side effects - no   Medication compliance: good Low salt diet- yes    DIABETES    Blood Sugar ranges-not checking   Polyuria- no New Visual problems- no  Hypoglycemic symptoms- no  Other side effects-no Medication compliance - good Foot exam- today   HYPERLIPIDEMIA  Medication compliance- good RUQ pain- no  Muscle aches- no Other side effects-no       Review of Systems  Constitutional: Negative for appetite change, diaphoresis, fatigue and unexpected weight change.  Eyes: Negative for pain, redness and visual disturbance.  Respiratory: Negative for cough, chest tightness, shortness of breath and wheezing.   Cardiovascular: Negative for chest pain, palpitations and leg swelling.  Endocrine: Negative for cold intolerance, heat intolerance, polydipsia, polyphagia and polyuria.  Genitourinary: Negative for difficulty urinating, dysuria and frequency.  Neurological: Negative for dizziness, light-headedness, numbness and headaches.    History Past Medical History:  Diagnosis Date  . Breast cancer ([place]) 12/2018   right DCIS  . Dysphagia   . Esophageal cancer ([place])   . GERD (gastroesophageal reflux disease)   . Hyperlipemia   . Hypertension    no meds now  . Impaired fasting blood sugar   . PONV (postoperative nausea and vomiting)   . Pre-diabetes     She has a past surgical history that includes Esophagus surgery; Esophagogastroduodenoscopy (11/17/2011); Eye surgery; Direct laryngoscopy with botox injection; Ganglion cyst excision (Left, 01/26/2013); and Breast lumpectomy with radioactive seed localization (Right, 01/18/2019).   Her family history  includes AAA (abdominal aortic aneurysm) in her mother; Alcohol abuse in her sister; COPD in her father.She reports that she quit smoking about 31 years ago. Her smoking use included cigarettes. She has never used smokeless tobacco. She reports that she does not drink alcohol or use drugs.  Current Outpatient Medications on File Prior to Visit  Medication Sig Dispense Refill  . CALCIUM-VITAMIN D PO Take 600 mg by mouth daily.     . cetirizine (ZYRTEC) 10 MG tablet Take 10 mg by mouth as needed for allergies.    . famotidine (PEPCID) 20 MG tablet TAKE 1 TABLET DAILY 90 tablet 3  . fluticasone (FLONASE) 50 MCG/ACT nasal spray Place 2 sprays into the nose daily as needed.     Marland Kitchen glucose blood (ONE TOUCH ULTRA TEST) test strip USE TO CHECK BLOOD SUGAR ONCE A DAY.  Dx Code E11.9 100 each 1  . Lancets MISC Use as directed once a day. DX code E11.9 100 each 1  . latanoprost (XALATAN) 0.005 % ophthalmic solution Place 1 drop into both eyes daily.    . Multiple Vitamins-Minerals (MULTIVITAMIN PO) Take 1 tablet by mouth daily.     . Multiple Vitamins-Minerals (PRESERVISION AREDS) CAPS Take by mouth.    . Omega-3 Fatty Acids (FISH OIL PO) Take 1,200 mg by mouth daily.     . pantoprazole (PROTONIX) 40 MG tablet TAKE 1 TABLET DAILY (Patient taking differently: daily. ) 90 tablet 3  . simvastatin (ZOCOR) 20 MG tablet TAKE 1 TABLET DAILY AT 6 P.M. 90 tablet 4   No current facility-administered medications  on file prior to visit.      Objective:  Objective  Physical Exam Vitals signs and nursing note reviewed.  Constitutional:      Appearance: She is well-developed.  HENT:     Head: Normocephalic and atraumatic.  Eyes:     Conjunctiva/sclera: Conjunctivae normal.  Neck:     Musculoskeletal: Normal range of motion and neck supple.     Thyroid: No thyromegaly.     Vascular: No carotid bruit or JVD.  Cardiovascular:     Rate and Rhythm: Normal rate and regular rhythm.     Heart sounds: Normal heart  sounds. No murmur.  Pulmonary:     Effort: Pulmonary effort is normal. No respiratory distress.     Breath sounds: Normal breath sounds. No wheezing or rales.  Chest:     Chest wall: No tenderness.  Neurological:     Mental Status: She is alert and oriented to person, place, and time.    Diabetic Foot Exam - Simple   Simple Foot Form Diabetic Foot exam was performed with the following findings: Yes 05/22/2019  8:42 AM  Visual Inspection No deformities, no ulcerations, no other skin breakdown bilaterally: Yes Sensation Testing Intact to touch and monofilament testing bilaterally: Yes Pulse Check Posterior Tibialis and Dorsalis pulse intact bilaterally: Yes Comments     BP 134/84 (BP Location: Left Arm, Patient Position: Sitting, Cuff Size: Normal)   Pulse 89   Temp 98 F (36.7 C) (Oral)   Resp 18   Ht 5' 2.5" (1.588 m)   Wt 125 lb (56.7 kg)   SpO2 100%   BMI 22.50 kg/m  Wt Readings from Last 3 Encounters:  05/22/19 125 lb (56.7 kg)  01/18/19 127 lb 6.8 oz (57.8 kg)  11/21/18 127 lb 6.4 oz (57.8 kg)     Lab Results  Component Value Date   WBC 5.0 05/19/2018   HGB 12.9 05/19/2018   HCT 38.4 05/19/2018   PLT 207.0 05/19/2018   GLUCOSE 128 (H) 11/21/2018   CHOL 195 11/21/2018   TRIG 101.0 11/21/2018   HDL 85.30 11/21/2018   LDLCALC 90 11/21/2018   ALT 15 11/21/2018   AST 21 11/21/2018   NA 142 11/21/2018   K 4.2 11/21/2018   CL 104 11/21/2018   CREATININE 1.07 11/21/2018   BUN 14 11/21/2018   CO2 28 11/21/2018   TSH 2.147 04/24/2013   HGBA1C 6.9 (H) 11/21/2018   MICROALBUR <0.7 05/17/2017    Mm Breast Surgical Specimen  Result Date: 01/18/2019 CLINICAL DATA:  Status post seed localized RIGHT lumpectomy. EXAM: SPECIMEN RADIOGRAPH OF THE RIGHT BREAST COMPARISON:  01/17/2019 and her FINDINGS: Status post excision of the right breast. The radioactive seed and biopsy marker clip are present, completely intact, and were marked for pathology. IMPRESSION: Specimen  radiograph of the right breast. Electronically Signed   By: Nolon Nations M.D.   On: 01/18/2019 11:22     Assessment & Plan:  Plan  I am having [name] maintain her CALCIUM-VITAMIN D PO, Multiple Vitamins-Minerals (MULTIVITAMIN PO), Omega-3 Fatty Acids (FISH OIL PO), cetirizine, fluticasone, PreserVision AREDS, latanoprost, simvastatin, pantoprazole, Lancets, glucose blood, and famotidine.  No orders of the defined types were placed in this encounter.   Problem List Items Addressed This Visit      Unprioritized   Diet-controlled diabetes mellitus ([place]) - Primary    con't diet Check labs       Relevant Orders   Hemoglobin A1c   Comprehensive metabolic panel  Microalbumin / creatinine urine ratio   Ductal carcinoma in situ (DCIS) of right breast    S/p radiation  F/u oncology      Hyperlipidemia associated with type 2 diabetes mellitus ([place])    Encouraged heart healthy diet, increase exercise, avoid trans fats, consider a krill oil cap daily      Relevant Orders   Lipid panel   Comprehensive metabolic panel      Follow-up: Return in about 6 months (around 11/22/2019), or if symptoms worsen or fail to improve, for hypertension, hyperlipidemia, diabetes II.  Ann Held, DO

## 2019-05-22 NOTE — Assessment & Plan Note (Signed)
Encouraged heart healthy diet, increase exercise, avoid trans fats, consider a krill oil cap daily 

## 2019-06-06 DIAGNOSIS — H26493 Other secondary cataract, bilateral: Secondary | ICD-10-CM | POA: Diagnosis not present

## 2019-06-06 DIAGNOSIS — H401131 Primary open-angle glaucoma, bilateral, mild stage: Secondary | ICD-10-CM | POA: Diagnosis not present

## 2019-06-06 DIAGNOSIS — H353132 Nonexudative age-related macular degeneration, bilateral, intermediate dry stage: Secondary | ICD-10-CM | POA: Diagnosis not present

## 2019-07-17 DIAGNOSIS — Z23 Encounter for immunization: Secondary | ICD-10-CM | POA: Diagnosis not present

## 2019-07-19 ENCOUNTER — Telehealth: Payer: Self-pay

## 2019-07-19 NOTE — Telephone Encounter (Signed)
Copied from [place] [id number]. Topic: General - Other >> Jul 18, 2019  9:38 AM Celene Kras A wrote: Reason for CRM: Pt called stating she is received her flu shot on 07/17/2019 and she had her eyes checked on 06/06/2019. Please advise.

## 2019-07-27 DIAGNOSIS — H353132 Nonexudative age-related macular degeneration, bilateral, intermediate dry stage: Secondary | ICD-10-CM | POA: Diagnosis not present

## 2019-08-26 DIAGNOSIS — H353132 Nonexudative age-related macular degeneration, bilateral, intermediate dry stage: Secondary | ICD-10-CM | POA: Diagnosis not present

## 2019-09-11 ENCOUNTER — Ambulatory Visit (INDEPENDENT_AMBULATORY_CARE_PROVIDER_SITE_OTHER): Payer: Medicare Other | Admitting: Family Medicine

## 2019-09-11 ENCOUNTER — Other Ambulatory Visit: Payer: Self-pay

## 2019-09-11 ENCOUNTER — Encounter: Payer: Self-pay | Admitting: Family Medicine

## 2019-09-11 VITALS — BP 128/60 | HR 93 | Temp 97.6°F | Resp 18 | Ht 62.5 in | Wt 125.6 lb

## 2019-09-11 DIAGNOSIS — B029 Zoster without complications: Secondary | ICD-10-CM | POA: Diagnosis not present

## 2019-09-11 DIAGNOSIS — L03213 Periorbital cellulitis: Secondary | ICD-10-CM | POA: Insufficient documentation

## 2019-09-11 MED ORDER — VALACYCLOVIR HCL 1 G PO TABS: 1000.0000 mg | ORAL_TABLET | Freq: Three times a day (TID) | ORAL | 0 refills | Status: DC

## 2019-09-11 MED ORDER — DOXYCYCLINE HYCLATE 100 MG PO TABS: 100.0000 mg | ORAL_TABLET | Freq: Two times a day (BID) | ORAL | 0 refills | Status: DC

## 2019-09-11 NOTE — Assessment & Plan Note (Signed)
abx per orders  more likely shingles based on pt description but because it is hot to touch and red -- I will tx with doxy as well  Pt to see opth tomorrow since rash is getting so close to the eye

## 2019-09-11 NOTE — Progress Notes (Signed)
Patient ID: Briana Craig, female    DOB: 08/22/1946  Age: 73 y.o. MRN: [id number]    Subjective:  Subjective  HPI Briana Craig presents for ? Shingles r side of face..  Her and her husband both had the new vaccine --- her husband recently got treated for shingles on his abd Hers started the same as his but  On the face.   It is getting very close to the eye  Review of Systems  Constitutional: Negative for activity change, appetite change, fatigue and unexpected weight change.  Respiratory: Negative for cough and shortness of breath.   Cardiovascular: Negative for chest pain and palpitations.  Skin: Positive for rash.  Psychiatric/Behavioral: Negative for behavioral problems and dysphoric mood. The patient is not nervous/anxious.     History Past Medical History:  Diagnosis Date  . Breast cancer ([place]) 12/2018   right DCIS  . Dysphagia   . Esophageal cancer ([place])   . GERD (gastroesophageal reflux disease)   . Hyperlipemia   . Hypertension    no meds now  . Impaired fasting blood sugar   . PONV (postoperative nausea and vomiting)   . Pre-diabetes     She has a past surgical history that includes Esophagus surgery; Esophagogastroduodenoscopy (11/17/2011); Eye surgery; Direct laryngoscopy with botox injection; Ganglion cyst excision (Left, 01/26/2013); and Breast lumpectomy with radioactive seed localization (Right, 01/18/2019).   Her family history includes AAA (abdominal aortic aneurysm) in her mother; Alcohol abuse in her sister; COPD in her father.She reports that she quit smoking about 32 years ago. Her smoking use included cigarettes. She has never used smokeless tobacco. She reports that she does not drink alcohol or use drugs.  Current Outpatient Medications on File Prior to Visit  Medication Sig Dispense Refill  . CALCIUM-VITAMIN D PO Take 600 mg by mouth daily.     . cetirizine (ZYRTEC) 10 MG tablet Take 10 mg by mouth as needed for allergies.    . famotidine  (PEPCID) 20 MG tablet TAKE 1 TABLET DAILY 90 tablet 3  . fluticasone (FLONASE) 50 MCG/ACT nasal spray Place 2 sprays into the nose daily as needed.     Marland Kitchen glucose blood (ONE TOUCH ULTRA TEST) test strip USE TO CHECK BLOOD SUGAR ONCE A DAY.  Dx Code E11.9 100 each 1  . Lancets MISC Use as directed once a day. DX code E11.9 100 each 1  . latanoprost (XALATAN) 0.005 % ophthalmic solution Place 1 drop into both eyes daily.    . Multiple Vitamins-Minerals (MULTIVITAMIN PO) Take 1 tablet by mouth daily.     . Multiple Vitamins-Minerals (PRESERVISION AREDS) CAPS Take by mouth.    . Omega-3 Fatty Acids (FISH OIL PO) Take 1,200 mg by mouth daily.     . pantoprazole (PROTONIX) 40 MG tablet TAKE 1 TABLET DAILY (Patient taking differently: daily. ) 90 tablet 3  . simvastatin (ZOCOR) 20 MG tablet TAKE 1 TABLET DAILY AT 6 P.M. 90 tablet 4   No current facility-administered medications on file prior to visit.      Objective:  Objective  Physical Exam Vitals signs and nursing note reviewed.  Constitutional:      Appearance: She is well-developed.  HENT:     Head: Normocephalic and atraumatic.   Eyes:     Conjunctiva/sclera: Conjunctivae normal.  Neck:     Musculoskeletal: Normal range of motion and neck supple.     Thyroid: No thyromegaly.     Vascular: No carotid bruit or JVD.  Cardiovascular:     Rate and Rhythm: Normal rate and regular rhythm.     Heart sounds: Normal heart sounds. No murmur.  Pulmonary:     Effort: Pulmonary effort is normal. No respiratory distress.     Breath sounds: Normal breath sounds. No wheezing or rales.  Chest:     Chest wall: No tenderness.  Skin:    Findings: Erythema and rash present.  Neurological:     Mental Status: She is alert and oriented to person, place, and time.    BP 128/60 (BP Location: Right Arm, Patient Position: Sitting, Cuff Size: Normal)   Pulse 93   Temp 97.6 F (36.4 C) (Temporal)   Resp 18   Ht 5' 2.5" (1.588 m)   Wt 125 lb 9.6 oz  (57 kg)   SpO2 96%   BMI 22.61 kg/m  Wt Readings from Last 3 Encounters:  09/11/19 125 lb 9.6 oz (57 kg)  05/22/19 125 lb (56.7 kg)  01/18/19 127 lb 6.8 oz (57.8 kg)     Lab Results  Component Value Date   WBC 5.0 05/19/2018   HGB 12.9 05/19/2018   HCT 38.4 05/19/2018   PLT 207.0 05/19/2018   GLUCOSE 126 (H) 05/22/2019   CHOL 191 05/22/2019   TRIG 87.0 05/22/2019   HDL 83.20 05/22/2019   LDLCALC 90 05/22/2019   ALT 17 05/22/2019   AST 21 05/22/2019   NA 140 05/22/2019   K 4.1 05/22/2019   CL 103 05/22/2019   CREATININE 1.11 05/22/2019   BUN 23 05/22/2019   CO2 29 05/22/2019   TSH 2.147 04/24/2013   HGBA1C 6.8 (H) 05/22/2019   MICROALBUR 0.8 05/22/2019    Mm Breast Surgical Specimen  Result Date: 01/18/2019 CLINICAL DATA:  Status post seed localized RIGHT lumpectomy. EXAM: SPECIMEN RADIOGRAPH OF THE RIGHT BREAST COMPARISON:  01/17/2019 and her FINDINGS: Status post excision of the right breast. The radioactive seed and biopsy marker clip are present, completely intact, and were marked for pathology. IMPRESSION: Specimen radiograph of the right breast. Electronically Signed   By: Nolon Nations M.D.   On: 01/18/2019 11:22     Assessment & Plan:  Plan  I am having Briana Craig start on valACYclovir and doxycycline. I am also having her maintain her CALCIUM-VITAMIN D PO, Multiple Vitamins-Minerals (MULTIVITAMIN PO), Omega-3 Fatty Acids (FISH OIL PO), cetirizine, fluticasone, PreserVision AREDS, latanoprost, simvastatin, pantoprazole, Lancets, glucose blood, and famotidine.  Meds ordered this encounter  Medications  . valACYclovir (VALTREX) 1000 MG tablet    Sig: Take 1 tablet (1,000 mg total) by mouth 3 (three) times daily.    Dispense:  30 tablet    Refill:  0  . doxycycline (VIBRA-TABS) 100 MG tablet    Sig: Take 1 tablet (100 mg total) by mouth 2 (two) times daily.    Dispense:  20 tablet    Refill:  0    Problem List Items Addressed This Visit       Unprioritized   Periorbital cellulitis of right eye    abx per orders  more likely shingles based on pt description but because it is hot to touch and red -- I will tx with doxy as well  Pt to see opth tomorrow since rash is getting so close to the eye       Relevant Medications   doxycycline (VIBRA-TABS) 100 MG tablet    Other Visit Diagnoses    Herpes zoster without complication    -  Primary  Relevant Medications   valACYclovir (VALTREX) 1000 MG tablet      Follow-up: No follow-ups on file.  Ann Held, DO

## 2019-09-11 NOTE — Patient Instructions (Signed)
Shingles  Shingles, which is also known as herpes zoster, is an infection that causes a painful skin rash and fluid-filled blisters. It is caused by a virus. Shingles only develops in people who:  Have had chickenpox.  Have been given a medicine to protect against chickenpox (have been vaccinated). Shingles is rare in this group. What are the causes? Shingles is caused by varicella-zoster virus (VZV). This is the same virus that causes chickenpox. After a person is exposed to VZV, the virus stays in the body in an inactive (dormant) state. Shingles develops if the virus is reactivated. This can happen many years after the first (initial) exposure to VZV. It is not known what causes this virus to be reactivated. What increases the risk? People who have had chickenpox or received the chickenpox vaccine are at risk for shingles. Shingles infection is more common in people who:  Are older than age 60.  Have a weakened disease-fighting system (immune system), such as people with: ? HIV. ? AIDS. ? Cancer.  Are taking medicines that weaken the immune system, such as transplant medicines.  Are experiencing a lot of stress. What are the signs or symptoms? Early symptoms of this condition include itching, tingling, and pain in an area on your skin. Pain may be described as burning, stabbing, or throbbing. A few days or weeks after early symptoms start, a painful red rash appears. The rash is usually on one side of the body and has a band-like or belt-like pattern. The rash eventually turns into fluid-filled blisters that break open, change into scabs, and dry up in about 2-3 weeks. At any time during the infection, you may also develop:  A fever.  Chills.  A headache.  An upset stomach. How is this diagnosed? This condition is diagnosed with a skin exam. Skin or fluid samples may be taken from the blisters before a diagnosis is made. These samples are examined under a microscope or sent to  a lab for testing. How is this treated? The rash may last for several weeks. There is not a specific cure for this condition. Your health care provider will probably prescribe medicines to help you manage pain, recover more quickly, and avoid long-term problems. Medicines may include:  Antiviral drugs.  Anti-inflammatory drugs.  Pain medicines.  Anti-itching medicines (antihistamines). If the area involved is on your face, you may be referred to a specialist, such as an eye doctor (ophthalmologist) or an ear, nose, and throat (ENT) doctor (otolaryngologist) to help you avoid eye problems, chronic pain, or disability. Follow these instructions at home: Medicines  Take over-the-counter and prescription medicines only as told by your health care provider.  Apply an anti-itch cream or numbing cream to the affected area as told by your health care provider. Relieving itching and discomfort   Apply cold, wet cloths (cold compresses) to the area of the rash or blisters as told by your health care provider.  Cool baths can be soothing. Try adding baking soda or dry oatmeal to the water to reduce itching. Do not bathe in hot water. Blister and rash care  Keep your rash covered with a loose bandage (dressing). Wear loose-fitting clothing to help ease the pain of material rubbing against the rash.  Keep your rash and blisters clean by washing the area with mild soap and cool water as told by your health care provider.  Check your rash every day for signs of infection. Check for: ? More redness, swelling, or pain. ? Fluid   or blood. ? Warmth. ? Pus or a bad smell.  Do not scratch your rash or pick at your blisters. To help avoid scratching: ? Keep your fingernails clean and cut short. ? Wear gloves or mittens while you sleep, if scratching is a problem. General instructions  Rest as told by your health care provider.  Keep all follow-up visits as told by your health care provider. This  is important.  Wash your hands often with soap and water. If soap and water are not available, use hand sanitizer. Doing this lowers your chance of getting a bacterial skin infection.  Before your blisters change into scabs, your shingles infection can cause chickenpox in people who have never had it or have never been vaccinated against it. To prevent this from happening, avoid contact with other people, especially: ? Babies. ? Pregnant women. ? Children who have eczema. ? Elderly people who have transplants. ? People who have chronic illnesses, such as cancer or AIDS. Contact a health care provider if:  Your pain is not relieved with prescribed medicines.  Your pain does not get better after the rash heals.  You have signs of infection in the rash area, such as: ? More redness, swelling, or pain around the rash. ? Fluid or blood coming from the rash. ? The rash area feeling warm to the touch. ? Pus or a bad smell coming from the rash. Get help right away if:  The rash is on your face or nose.  You have facial pain, pain around your eye area, or loss of feeling on one side of your face.  You have difficulty seeing.  You have ear pain or have ringing in your ear.  You have a loss of taste.  Your condition gets worse. Summary  Shingles, which is also known as herpes zoster, is an infection that causes a painful skin rash and fluid-filled blisters.  This condition is diagnosed with a skin exam. Skin or fluid samples may be taken from the blisters and examined before the diagnosis is made.  Keep your rash covered with a loose bandage (dressing). Wear loose-fitting clothing to help ease the pain of material rubbing against the rash.  Before your blisters change into scabs, your shingles infection can cause chickenpox in people who have never had it or have never been vaccinated against it. This information is not intended to replace advice given to you by your health care  provider. Make sure you discuss any questions you have with your health care provider. Document Released: 10/19/2005 Document Revised: 02/10/2019 Document Reviewed: 06/23/2017 Elsevier Patient Education  2020 Elsevier Inc.  

## 2019-09-12 ENCOUNTER — Telehealth: Payer: Self-pay

## 2019-09-12 DIAGNOSIS — B0059 Other herpesviral disease of eye: Secondary | ICD-10-CM | POA: Diagnosis not present

## 2019-09-12 NOTE — Telephone Encounter (Signed)
OCopied from [place] [phone]. Topic: Medical Record Request - Provider/Facility Request >> Sep 12, 2019 11:33 AM Erick Blinks wrote: Patient Name/DOB/MRN #: Briana Craig / June 29, 1946 / [id number] Requestor Name/Agency: [place]. Lilia Pro  Call Back #: [phone] Information Requested: Requesting office to fax notes from pt's latest office visit. (09/11/2019 [place])  Fax: [phone]  Route to [facility] for [place] clinics. For all other clinics, route to the clinic's PEC Pool.

## 2019-09-12 NOTE — Telephone Encounter (Signed)
OV notes faxed  

## 2019-09-25 ENCOUNTER — Other Ambulatory Visit: Payer: Self-pay

## 2019-09-25 DIAGNOSIS — H353132 Nonexudative age-related macular degeneration, bilateral, intermediate dry stage: Secondary | ICD-10-CM | POA: Diagnosis not present

## 2019-10-19 ENCOUNTER — Other Ambulatory Visit: Payer: Self-pay | Admitting: Family Medicine

## 2019-10-19 DIAGNOSIS — C159 Malignant neoplasm of esophagus, unspecified: Secondary | ICD-10-CM

## 2019-10-19 NOTE — Telephone Encounter (Signed)
Last OV 09/11/19 Last refill 10/24/18 #90/3 Next OV 11/24/19

## 2019-11-01 ENCOUNTER — Other Ambulatory Visit: Payer: Self-pay | Admitting: Family Medicine

## 2019-11-01 DIAGNOSIS — E785 Hyperlipidemia, unspecified: Secondary | ICD-10-CM

## 2019-11-02 ENCOUNTER — Other Ambulatory Visit: Payer: Self-pay | Admitting: *Deleted

## 2019-11-02 DIAGNOSIS — E785 Hyperlipidemia, unspecified: Secondary | ICD-10-CM

## 2019-11-02 DIAGNOSIS — K219 Gastro-esophageal reflux disease without esophagitis: Secondary | ICD-10-CM

## 2019-11-02 MED ORDER — ACCU-CHEK AVIVA PLUS VI STRP: ORAL_STRIP | 1 refills | Status: DC

## 2019-11-02 MED ORDER — SIMVASTATIN 20 MG PO TABS: ORAL_TABLET | ORAL | 1 refills | Status: DC

## 2019-11-02 MED ORDER — FAMOTIDINE 20 MG PO TABS: ORAL_TABLET | ORAL | 0 refills | Status: DC

## 2019-11-02 MED ORDER — FAMOTIDINE 20 MG PO TABS: ORAL_TABLET | ORAL | 1 refills | Status: DC

## 2019-11-02 MED ORDER — ACCU-CHEK AVIVA PLUS W/DEVICE KIT: PACK | 0 refills | Status: DC

## 2019-11-02 MED ORDER — ACCU-CHEK SOFTCLIX LANCETS MISC: 1 refills | Status: DC

## 2019-11-02 MED ORDER — ACCU-CHEK AVIVA VI SOLN: 1 refills | Status: DC

## 2019-11-02 NOTE — Telephone Encounter (Signed)
Received fax from [facility] (ID [id number]) requesting RXs for:  Simvastatin, pantoprazole, accu-chek Aviva plus meter, test strips, softclix lancets and accu chek aviva solution.  Simvastatin and pantoprazole were just sent to [facility]. Pt states insurance is changing on 11/03/19 and prices will be more through [place]. Please send refills of famotidine to [facility]. Sent Rxs to [facility] to place on hold for simvastatin and pantoprazole and pt will contact them when they are due again. Send Aviva supplies to [place].

## 2019-11-21 ENCOUNTER — Ambulatory Visit: Payer: Medicare PPO | Attending: Internal Medicine

## 2019-11-21 DIAGNOSIS — Z23 Encounter for immunization: Secondary | ICD-10-CM | POA: Insufficient documentation

## 2019-11-21 NOTE — Progress Notes (Signed)
   Covid-19 Vaccination Clinic  Name:  Briana Craig    MRN: [id number] DOB: [birth year]  11/21/2019  Briana Craig was observed post Covid-19 immunization for 15 minutes without incidence. She was provided with Vaccine Information Sheet and instruction to access the V-Safe system.   Briana Craig was instructed to call 911 with any severe reactions post vaccine: Marland Kitchen Difficulty breathing  . Swelling of your face and throat  . A fast heartbeat  . A bad rash all over your body  . Dizziness and weakness    Immunizations Administered    Name Date Dose VIS Date Route   Pfizer COVID-19 Vaccine 11/21/2019  5:47 PM 0.3 mL 10/13/2019 Intramuscular   Manufacturer: [place]   Lot: [id number]   [place]: [id number]

## 2019-11-23 NOTE — Progress Notes (Signed)
Virtual Visit via Audio Note  I connected with patient on 11/24/19 at  2:00 PM EST by audio enabled telemedicine application and verified that I am speaking with the correct person using two identifiers.   THIS ENCOUNTER IS A VIRTUAL VISIT DUE TO COVID-19 - PATIENT WAS NOT SEEN IN THE OFFICE. PATIENT HAS CONSENTED TO VIRTUAL VISIT / TELEMEDICINE VISIT   Location of patient: home  Location of provider: office  I discussed the limitations of evaluation and management by telemedicine and the availability of in person appointments. The patient expressed understanding and agreed to proceed.   Subjective:   Briana Craig is a 74 y.o. female who presents for Medicare Annual (Subsequent) preventive examination.  Review of Systems:  Home Safety/Smoke Alarms: Feels safe in home. Smoke alarms in place.  Lives with husband and 1 dog.   Female:       Mammo- 11/24/18. Pt states she will schedule after 2nd covid shot.      Dexa scan-  Pt states she will schedule after 2nd covid shot.           CCS-01/05/11    Objective:     Vitals: Unable to assess. This visit is enabled though telemedicine due to Covid 19.   Advanced Directives 11/24/2019 01/18/2019 01/09/2019 12/11/2016 11/25/2015 09/25/2014 01/23/2013  Does Patient Have a Medical Advance Directive? _0  Yes Patient has advance directive, copy not in chart  Type of Advance Directive [place];Living will [place];Living will Living will [place];Living will [place];Living will [place];Living will -  Does patient want to make changes to medical advance directive? No - Patient declined No - Patient declined No - Patient declined - No - Patient declined - -  Copy of [place] in Chart? No - copy requested No - copy requested No - copy requested No - copy requested No - copy requested No - copy requested -  Would  patient like information on creating a medical advance directive? - - - No - Patient declined - - -  Pre-existing out of facility DNR order (yellow form or pink MOST form) - - - - - - -    Tobacco Social History   Tobacco Use  Smoking Status Former Smoker  . Types: Cigarettes  . Quit date: 08/30/1987  . Years since quitting: 32.2  Smokeless Tobacco Never Used     Counseling given: Not Answered   Clinical Intake: Pain : No/denies pain     Past Medical History:  Diagnosis Date  . Breast cancer ([place]) 12/2018   right DCIS  . Dysphagia   . Esophageal cancer ([place])   . GERD (gastroesophageal reflux disease)   . Hyperlipemia   . Hypertension    no meds now  . Impaired fasting blood sugar   . PONV (postoperative nausea and vomiting)   . Pre-diabetes    Past Surgical History:  Procedure Laterality Date  . BREAST LUMPECTOMY WITH RADIOACTIVE SEED LOCALIZATION Right 01/18/2019   Procedure: RIGHT BREAST CENTRAL LUMPECTOMY WITH RADIOACTIVE SEED LOCALIZATION;  Surgeon: Rolm Bookbinder, MD;  Location: [facility];  Service: General;  Laterality: Right;  . DIRECT LARYNGOSCOPY WITH BOTOX INJECTION     tx paralysed vocal cord  . ESOPHAGOGASTRODUODENOSCOPY  11/17/2011   Procedure: ESOPHAGOGASTRODUODENOSCOPY (EGD);  Surgeon: Owens Loffler, MD;  Location: [facility];  Service: Endoscopy;  Laterality: N/A;  . ESOPHAGUS SURGERY    .  EYE SURGERY     both cataracts  . GANGLION CYST EXCISION Left 01/26/2013   Procedure: LEFT FLEXOR CARPI RADIALIS  RELEASE AND SCAPHO TRAPEZIAL TRAPEZOID DEBRIDEMENT ;  Surgeon: Tennis Must, MD;  Location: [facility];  Service: Orthopedics;  Laterality: Left;   Family History  Problem Relation Age of Onset  . Alcohol abuse Sister   . AAA (abdominal aortic aneurysm) Mother   . COPD Father   . Colon cancer Neg Hx    Social History   Socioeconomic History  . Marital status: Married    Spouse name: Sonia Side  . Number of  children: 0  . Years of education: 65  . Highest education level: Not on file  Occupational History  . Occupation: RETIRED     Employer: OTHER  Tobacco Use  . Smoking status: Former Smoker    Types: Cigarettes    Quit date: 08/30/1987    Years since quitting: 32.2  . Smokeless tobacco: Never Used  Substance and Sexual Activity  . Alcohol use: No  . Drug use: Never  . Sexual activity: Yes    Partners: Male    Birth control/protection: Post-menopausal  Other Topics Concern  . Not on file  Social History Narrative   Marital Status:  Married Sonia Side)   Children:  None    Pets: Dog (1)    Living Situation: Lives with husband.   Occupation:  Retired Visual merchandiser)    Education: Master's Degree    Tobacco Use/Exposure:  Formal Smoker    Alcohol Use:  Occasional   Drug Use:  None   Diet:  Regular   Exercise:  Walking 1 mile 5 times per week.    Hobbies:  Reading                Social Determinants of Health   Financial Resource Strain:   . Difficulty of Paying Living Expenses: Not on file  Food Insecurity:   . Worried About Charity fundraiser in the Last Year: Not on file  . Ran Out of Food in the Last Year: Not on file  Transportation Needs:   . Lack of Transportation (Medical): Not on file  . Lack of Transportation (Non-Medical): Not on file  Physical Activity:   . Days of Exercise per Week: Not on file  . Minutes of Exercise per Session: Not on file  Stress:   . Feeling of Stress : Not on file  Social Connections:   . Frequency of Communication with Friends and Family: Not on file  . Frequency of Social Gatherings with Friends and Family: Not on file  . Attends Religious Services: Not on file  . Active Member of Clubs or Organizations: Not on file  . Attends Archivist Meetings: Not on file  . Marital Status: Not on file    Outpatient Encounter Medications as of 11/24/2019  Medication Sig  . Accu-Chek Softclix Lancets lancets Use to check blood sugar once  a day.  DX  E11.9  . Blood Glucose Calibration (ACCU-CHEK AVIVA) SOLN Use to check control when opening a new bottle of strips.  DX E11.9  . Blood Glucose Monitoring Suppl (ACCU-CHEK AVIVA PLUS) w/Device KIT Use to check blood sugar once a day.  DX E11.9  . CALCIUM-VITAMIN D PO Take 600 mg by mouth daily.   . cetirizine (ZYRTEC) 10 MG tablet Take 10 mg by mouth as needed for allergies.  . famotidine (PEPCID) 20 MG tablet TAKE 1 TABLET DAILY  .  fluticasone (FLONASE) 50 MCG/ACT nasal spray Place 2 sprays into the nose daily as needed.   Marland Kitchen glucose blood (ACCU-CHEK AVIVA PLUS) test strip Use to check blood sugar once daily.  DX  E 11.9  . latanoprost (XALATAN) 0.005 % ophthalmic solution Place 1 drop into both eyes daily.  . Multiple Vitamins-Minerals (MULTIVITAMIN PO) Take 1 tablet by mouth daily.   . Multiple Vitamins-Minerals (PRESERVISION AREDS) CAPS Take by mouth.  . Omega-3 Fatty Acids (FISH OIL PO) Take 1,200 mg by mouth daily.   . pantoprazole (PROTONIX) 40 MG tablet TAKE 1 TABLET DAILY  . simvastatin (ZOCOR) 20 MG tablet TAKE 1 TABLET DAILY AT 6 P.M.  . [DISCONTINUED] doxycycline (VIBRA-TABS) 100 MG tablet Take 1 tablet (100 mg total) by mouth 2 (two) times daily.  . [DISCONTINUED] valACYclovir (VALTREX) 1000 MG tablet Take 1 tablet (1,000 mg total) by mouth 3 (three) times daily. (Patient not taking: Reported on 11/24/2019)   No facility-administered encounter medications on file as of 11/24/2019.    Activities of Daily Living In your present state of health, do you have any difficulty performing the following activities: 11/24/2019 01/18/2019  Hearing? N N  Vision? N N  Difficulty concentrating or making decisions? N N  Walking or climbing stairs? N N  Dressing or bathing? N N  Doing errands, shopping? N -  Preparing Food and eating ? N -  Using the Toilet? N -  In the past six months, have you accidently leaked urine? N -  Do you have problems with loss of bowel control? N -    Managing your Medications? N -  Managing your Finances? N -  Housekeeping or managing your Housekeeping? N -  Some recent data might be hidden    Patient Care Team: Carollee Herter, Alferd Apa, DO as PCP - General (Family Medicine)    Assessment:   This is a routine wellness examination for Jackolyn. Physical assessment deferred to PCP.  Exercise Activities and Dietary recommendations Current Exercise Habits: Home exercise routine, Type of exercise: walking, Time (Minutes): 20, Frequency (Times/Week): 5, Weekly Exercise (Minutes/Week): 100, Intensity: Mild, Exercise limited by: None identified Diet (meal preparation, eat out, water intake, caffeinated beverages, dairy products, fruits and vegetables): well balanced    Goals    . Maintain health and independence       Fall Risk Fall Risk  11/24/2019 09/25/2019 09/19/2018 06/03/2018 11/16/2016  Falls in the past year? 0 0 0 No No  Comment - [facility] Telephone Survey: data to providers prior to load [employer] Telephone Survey: data to providers prior to load [facility] Telephone Survey: data to providers prior to load -  Number falls in past yr: 0 - - - -  Injury with Fall? 0 - - - -  Follow up Education provided;Falls prevention discussed - - - -   Depression Screen PHQ 2/9 Scores 11/24/2019 05/19/2018 11/16/2016 11/25/2015  PHQ - 2 Score 0 0 0 0     Cognitive Function Ad8 score reviewed for issues:  Issues making decisions:no  Less interest in hobbies / activities:no  Repeats questions, stories (family complaining):no  Trouble using ordinary gadgets (microwave, computer, phone):no  Forgets the month or year: no  Mismanaging finances: no  Remembering appts:no  Daily problems with thinking and/or memory:no Ad8 score is=0        Immunization History  Administered Date(s) Administered  . Influenza, High Dose Seasonal PF 08/16/2014, 08/13/2015, 08/20/2016, 08/16/2018, 07/17/2019  . Influenza-Unspecified 08/13/2015  . PFIZER SARS-COV-2  Vaccination  11/21/2019  . Pneumococcal Conjugate-13 09/03/2014  . Pneumococcal Polysaccharide-23 12/03/2010, 11/16/2016  . Tdap 05/01/2013  . Zoster Recombinat (Shingrix) 06/04/2018, 10/01/2018   Screening Tests Health Maintenance  Topic Date Due  . HEMOGLOBIN A1C  11/22/2019  . MAMMOGRAM  11/25/2019  . FOOT EXAM  05/21/2020  . URINE MICROALBUMIN  05/21/2020  . OPHTHALMOLOGY EXAM  06/05/2020  . COLONOSCOPY  01/04/2021  . TETANUS/TDAP  05/02/2023  . INFLUENZA VACCINE  Completed  . DEXA SCAN  Completed  . Hepatitis C Screening  Completed  . PNA vac Low Risk Adult  Completed     Plan:   See you next year!  Continue to eat heart healthy diet (full of fruits, vegetables, whole grains, lean protein, water--limit salt, fat, and sugar intake) and increase physical activity as tolerated.  Continue doing brain stimulating activities (puzzles, reading, adult coloring books, staying active) to keep memory sharp.   Bring a copy of your living will and/or healthcare power of attorney to your next office visit.    I have personally reviewed and noted the following in the patient's chart:   . Medical and social history . Use of alcohol, tobacco or illicit drugs  . Current medications and supplements . Functional ability and status . Nutritional status . Physical activity . Advanced directives . List of other physicians . Hospitalizations, surgeries, and ER visits in previous 12 months . Vitals . Screenings to include cognitive, depression, and falls . Referrals and appointments  In addition, I have reviewed and discussed with patient certain preventive protocols, quality metrics, and best practice recommendations. A written personalized care plan for preventive services as well as general preventive health recommendations were provided to patient.     Shela Nevin, [place]  11/24/2019

## 2019-11-24 ENCOUNTER — Encounter: Payer: Self-pay | Admitting: *Deleted

## 2019-11-24 ENCOUNTER — Other Ambulatory Visit: Payer: Self-pay

## 2019-11-24 ENCOUNTER — Ambulatory Visit (INDEPENDENT_AMBULATORY_CARE_PROVIDER_SITE_OTHER): Payer: Medicare PPO | Admitting: *Deleted

## 2019-11-24 ENCOUNTER — Ambulatory Visit: Payer: Medicare Other | Admitting: Family Medicine

## 2019-11-24 DIAGNOSIS — Z0000 Encounter for general adult medical examination without abnormal findings: Secondary | ICD-10-CM | POA: Diagnosis not present

## 2019-11-24 NOTE — Patient Instructions (Signed)
See you next year!  Continue to eat heart healthy diet (full of fruits, vegetables, whole grains, lean protein, water--limit salt, fat, and sugar intake) and increase physical activity as tolerated.  Continue doing brain stimulating activities (puzzles, reading, adult coloring books, staying active) to keep memory sharp.   Bring a copy of your living will and/or healthcare power of attorney to your next office visit.   Briana Craig , Thank you for taking time to come for your Medicare Wellness Visit. I appreciate your ongoing commitment to your health goals. Please review the following plan we discussed and let me know if I can assist you in the future.   These are the goals we discussed: Goals    . Maintain health and independence       This is a list of the screening recommended for you and due dates:  Health Maintenance  Topic Date Due  . Hemoglobin A1C  11/22/2019  . Mammogram  11/25/2019  . Complete foot exam   05/21/2020  . Urine Protein Check  05/21/2020  . Eye exam for diabetics  06/05/2020  . Colon Cancer Screening  01/04/2021  . Tetanus Vaccine  05/02/2023  . Flu Shot  Completed  . DEXA scan (bone density measurement)  Completed  .  Hepatitis C: One time screening is recommended by Center for Disease Control  (CDC) for  adults born from [birth year] through 1967.   Completed  . Pneumonia vaccines  Completed    Preventive Care 26 Years and Older, Female Preventive care refers to lifestyle choices and visits with your health care provider that can promote health and wellness. This includes:  A yearly physical exam. This is also called an annual well check.  Regular dental and eye exams.  Immunizations.  Screening for certain conditions.  Healthy lifestyle choices, such as diet and exercise. What can I expect for my preventive care visit? Physical exam Your health care provider will check:  Height and weight. These may be used to calculate body mass index (BMI), which is  a measurement that tells if you are at a healthy weight.  Heart rate and blood pressure.  Your skin for abnormal spots. Counseling Your health care provider may ask you questions about:  Alcohol, tobacco, and drug use.  Emotional well-being.  Home and relationship well-being.  Sexual activity.  Eating habits.  History of falls.  Memory and ability to understand (cognition).  Work and work Statistician.  Pregnancy and menstrual history. What immunizations do I need?  Influenza (flu) vaccine  This is recommended every year. Tetanus, diphtheria, and pertussis (Tdap) vaccine  You may need a Td booster every 10 years. Varicella (chickenpox) vaccine  You may need this vaccine if you have not already been vaccinated. Zoster (shingles) vaccine  You may need this after age 46. Pneumococcal conjugate (PCV13) vaccine  One dose is recommended after age 52. Pneumococcal polysaccharide (PPSV23) vaccine  One dose is recommended after age 32. Measles, mumps, and rubella (MMR) vaccine  You may need at least one dose of MMR if you were born in 1957 or later. You may also need a second dose. Meningococcal conjugate (MenACWY) vaccine  You may need this if you have certain conditions. Hepatitis A vaccine  You may need this if you have certain conditions or if you travel or work in places where you may be exposed to hepatitis A. Hepatitis B vaccine  You may need this if you have certain conditions or if you travel or work  in places where you may be exposed to hepatitis B. Haemophilus influenzae type b (Hib) vaccine  You may need this if you have certain conditions. You may receive vaccines as individual doses or as more than one vaccine together in one shot (combination vaccines). Talk with your health care provider about the risks and benefits of combination vaccines. What tests do I need? Blood tests  Lipid and cholesterol levels. These may be checked every 5 years, or more  frequently depending on your overall health.  Hepatitis C test.  Hepatitis B test. Screening  Lung cancer screening. You may have this screening every year starting at age 17 if you have a 30-pack-year history of smoking and currently smoke or have quit within the past 15 years.  Colorectal cancer screening. All adults should have this screening starting at age 46 and continuing until age 38. Your health care provider may recommend screening at age 42 if you are at increased risk. You will have tests every 1-10 years, depending on your results and the type of screening test.  Diabetes screening. This is done by checking your blood sugar (glucose) after you have not eaten for a while (fasting). You may have this done every 1-3 years.  Mammogram. This may be done every 1-2 years. Talk with your health care provider about how often you should have regular mammograms.  BRCA-related cancer screening. This may be done if you have a family history of breast, ovarian, tubal, or peritoneal cancers. Other tests  Sexually transmitted disease (STD) testing.  Bone density scan. This is done to screen for osteoporosis. You may have this done starting at age 95. Follow these instructions at home: Eating and drinking  Eat a diet that includes fresh fruits and vegetables, whole grains, lean protein, and low-fat dairy products. Limit your intake of foods with high amounts of sugar, saturated fats, and salt.  Take vitamin and mineral supplements as recommended by your health care provider.  Do not drink alcohol if your health care provider tells you not to drink.  If you drink alcohol: ? Limit how much you have to 0-1 drink a day. ? Be aware of how much alcohol is in your drink. In the [place] one drink equals one 12 oz bottle of beer (355 mL), one 5 oz glass of wine (148 mL), or one 1 oz glass of hard liquor (44 mL). Lifestyle  Take daily care of your teeth and gums.  Stay active. Exercise for at  least 30 minutes on 5 or more days each week.  Do not use any products that contain nicotine or tobacco, such as cigarettes, e-cigarettes, and chewing tobacco. If you need help quitting, ask your health care provider.  If you are sexually active, practice safe sex. Use a condom or other form of protection in order to prevent STIs (sexually transmitted infections).  Talk with your health care provider about taking a low-dose aspirin or statin. What's next?  Go to your health care provider once a year for a well check visit.  Ask your health care provider how often you should have your eyes and teeth checked.  Stay up to date on all vaccines. This information is not intended to replace advice given to you by your health care provider. Make sure you discuss any questions you have with your health care provider. Document Revised: 10/13/2018 Document Reviewed: 10/13/2018 Elsevier Patient Education  2020 [employer].

## 2019-12-06 ENCOUNTER — Other Ambulatory Visit: Payer: Self-pay | Admitting: Family Medicine

## 2019-12-06 DIAGNOSIS — E119 Type 2 diabetes mellitus without complications: Secondary | ICD-10-CM

## 2019-12-06 DIAGNOSIS — E1169 Type 2 diabetes mellitus with other specified complication: Secondary | ICD-10-CM

## 2019-12-11 ENCOUNTER — Ambulatory Visit: Payer: Medicare PPO | Attending: Internal Medicine

## 2019-12-11 DIAGNOSIS — Z23 Encounter for immunization: Secondary | ICD-10-CM | POA: Insufficient documentation

## 2019-12-11 NOTE — Progress Notes (Signed)
   Covid-19 Vaccination [facility]  Name:  Briana Craig    MRN: [id number] DOB: 11-Jan-1946  12/11/2019  Ms. [name] was observed post Covid-19 immunization for 15 minutes without incidence. She was provided with Vaccine Information Sheet and instruction to access the V-Safe system.   [name] was instructed to call 911 with any severe reactions post vaccine: Marland Kitchen Difficulty breathing  . Swelling of your face and throat  . A fast heartbeat  . A bad rash all over your body  . Dizziness and weakness    Immunizations Administered    Name Date Dose VIS Date Route   Pfizer COVID-19 Vaccine 12/11/2019  9:22 AM 0.3 mL 10/13/2019 Intramuscular   Manufacturer: [place]   [place]ot: [id number]   [place]: [id number]

## 2019-12-20 ENCOUNTER — Other Ambulatory Visit: Payer: Self-pay | Admitting: *Deleted

## 2019-12-20 DIAGNOSIS — E785 Hyperlipidemia, unspecified: Secondary | ICD-10-CM

## 2019-12-20 DIAGNOSIS — C159 Malignant neoplasm of esophagus, unspecified: Secondary | ICD-10-CM

## 2019-12-20 MED ORDER — ACCU-CHEK AVIVA PLUS W/DEVICE KIT: PACK | 0 refills | Status: DC

## 2019-12-20 MED ORDER — ACCU-CHEK AVIVA VI SOLN: 1 refills | Status: AC

## 2019-12-20 MED ORDER — ACCU-CHEK AVIVA PLUS VI STRP: ORAL_STRIP | 1 refills | Status: DC

## 2019-12-20 MED ORDER — ACCU-CHEK SOFTCLIX LANCETS MISC: 1 refills | Status: AC

## 2019-12-20 MED ORDER — PANTOPRAZOLE SODIUM 40 MG PO TBEC: 40.0000 mg | DELAYED_RELEASE_TABLET | Freq: Every day | ORAL | 3 refills | Status: DC

## 2019-12-20 MED ORDER — SIMVASTATIN 20 MG PO TABS: ORAL_TABLET | ORAL | 1 refills | Status: DC

## 2019-12-25 ENCOUNTER — Ambulatory Visit: Payer: Medicare Other

## 2020-01-01 ENCOUNTER — Other Ambulatory Visit: Payer: Self-pay

## 2020-01-01 ENCOUNTER — Other Ambulatory Visit (INDEPENDENT_AMBULATORY_CARE_PROVIDER_SITE_OTHER): Payer: Medicare PPO

## 2020-01-01 ENCOUNTER — Other Ambulatory Visit: Payer: Self-pay | Admitting: Emergency Medicine

## 2020-01-01 DIAGNOSIS — E1169 Type 2 diabetes mellitus with other specified complication: Secondary | ICD-10-CM

## 2020-01-01 DIAGNOSIS — E785 Hyperlipidemia, unspecified: Secondary | ICD-10-CM | POA: Diagnosis not present

## 2020-01-01 DIAGNOSIS — E119 Type 2 diabetes mellitus without complications: Secondary | ICD-10-CM

## 2020-01-01 LAB — COMPREHENSIVE METABOLIC PANEL
ALT: 16 U/L (ref 0–35)
AST: 23 U/L (ref 0–37)
Albumin: 4.1 g/dL (ref 3.5–5.2)
Alkaline Phosphatase: 52 U/L (ref 39–117)
BUN: 20 mg/dL (ref 6–23)
CO2: 29 mEq/L (ref 19–32)
Calcium: 10.1 mg/dL (ref 8.4–10.5)
Chloride: 101 mEq/L (ref 96–112)
Creatinine, Ser: 1 mg/dL (ref 0.40–1.20)
GFR: 54.02 mL/min — ABNORMAL LOW (ref >60.00)
Glucose, Bld: 139 mg/dL — ABNORMAL HIGH (ref 70–99)
Potassium: 4 mEq/L (ref 3.5–5.1)
Sodium: 139 mEq/L (ref 135–145)
Total Bilirubin: 0.6 mg/dL (ref 0.2–1.2)
Total Protein: 7.5 g/dL (ref 6.0–8.3)

## 2020-01-01 LAB — LIPID PANEL
Cholesterol: 198 mg/dL (ref 0–200)
HDL: 79.5 mg/dL (ref >39.00)
LDL Cholesterol: 102 mg/dL — ABNORMAL HIGH (ref 0–99)
NonHDL: 118.27
Total CHOL/HDL Ratio: 2
Triglycerides: 82 mg/dL (ref 0.0–149.0)
VLDL: 16.4 mg/dL (ref 0.0–40.0)

## 2020-01-01 LAB — CBC WITH DIFFERENTIAL/PLATELET
Basophils Absolute: 0 10*3/uL (ref 0.0–0.1)
Basophils Relative: 0.3 % (ref 0.0–3.0)
Eosinophils Absolute: 0.1 10*3/uL (ref 0.0–0.7)
Eosinophils Relative: 1.3 % (ref 0.0–5.0)
HCT: 39.9 % (ref 36.0–46.0)
Hemoglobin: 13.2 g/dL (ref 12.0–15.0)
Lymphocytes Relative: 12 % (ref 12.0–46.0)
Lymphs Abs: 1.2 10*3/uL (ref 0.7–4.0)
MCHC: 33.1 g/dL (ref 30.0–36.0)
MCV: 93.1 fl (ref 78.0–100.0)
Monocytes Absolute: 0.8 10*3/uL (ref 0.1–1.0)
Monocytes Relative: 8.4 % (ref 3.0–12.0)
Neutro Abs: 7.5 10*3/uL (ref 1.4–7.7)
Neutrophils Relative %: 78 % — ABNORMAL HIGH (ref 43.0–77.0)
Platelets: 211 10*3/uL (ref 150.0–400.0)
RBC: 4.29 Mil/uL (ref 3.87–5.11)
RDW: 14.6 % (ref 11.5–15.5)
WBC: 9.6 10*3/uL (ref 4.0–10.5)

## 2020-01-01 LAB — HEMOGLOBIN A1C: Hgb A1c MFr Bld: 6.9 % — ABNORMAL HIGH (ref 4.6–6.5)

## 2020-01-04 ENCOUNTER — Other Ambulatory Visit: Payer: Self-pay | Admitting: Family Medicine

## 2020-01-04 DIAGNOSIS — E1169 Type 2 diabetes mellitus with other specified complication: Secondary | ICD-10-CM

## 2020-01-04 DIAGNOSIS — E118 Type 2 diabetes mellitus with unspecified complications: Secondary | ICD-10-CM

## 2020-01-05 ENCOUNTER — Other Ambulatory Visit: Payer: Self-pay

## 2020-01-08 ENCOUNTER — Encounter: Payer: Self-pay | Admitting: Family Medicine

## 2020-01-08 ENCOUNTER — Other Ambulatory Visit: Payer: Self-pay

## 2020-01-08 ENCOUNTER — Ambulatory Visit (INDEPENDENT_AMBULATORY_CARE_PROVIDER_SITE_OTHER): Payer: Medicare PPO | Admitting: Family Medicine

## 2020-01-08 VITALS — BP 130/78 | HR 98 | Temp 97.5°F | Resp 18 | Ht 62.5 in | Wt 127.0 lb

## 2020-01-08 DIAGNOSIS — E785 Hyperlipidemia, unspecified: Secondary | ICD-10-CM

## 2020-01-08 DIAGNOSIS — K219 Gastro-esophageal reflux disease without esophagitis: Secondary | ICD-10-CM | POA: Diagnosis not present

## 2020-01-08 DIAGNOSIS — E1169 Type 2 diabetes mellitus with other specified complication: Secondary | ICD-10-CM | POA: Diagnosis not present

## 2020-01-08 DIAGNOSIS — E119 Type 2 diabetes mellitus without complications: Secondary | ICD-10-CM

## 2020-01-08 MED ORDER — FAMOTIDINE 40 MG PO TABS: 40.0000 mg | ORAL_TABLET | Freq: Every day | ORAL | 1 refills | Status: DC

## 2020-01-08 NOTE — Assessment & Plan Note (Signed)
hgba1c acceptable, minimize simple carbs. Increase exercise as tolerated. Continue current meds 

## 2020-01-08 NOTE — Progress Notes (Signed)
Patient ID: Briana Craig, female    DOB: 02/02/1946  Age: 73 y.o. MRN: [id number]    Subjective:  Subjective  HPI Briana Craig presents for dm and cholesterol   No complaints  She also has worsening of her gerd   Review of Systems  Constitutional: Negative for fever.  HENT: Negative for congestion.   Respiratory: Negative for cough and shortness of breath.   Cardiovascular: Negative for chest pain, palpitations and leg swelling.  Gastrointestinal: Negative for abdominal distention, abdominal pain, nausea and vomiting.  Musculoskeletal: Negative for back pain.  Skin: Negative for rash.  Neurological: Negative for headaches.    History Past Medical History:  Diagnosis Date  . Breast cancer (HCC) 12/2018   right DCIS  . Dysphagia   . Esophageal cancer (HCC)   . GERD (gastroesophageal reflux disease)   . Hyperlipemia   . Hypertension    no meds now  . Impaired fasting blood sugar   . PONV (postoperative nausea and vomiting)   . Pre-diabetes     She has a past surgical history that includes Esophagus surgery; Esophagogastroduodenoscopy (11/17/2011); Eye surgery; Direct laryngoscopy with botox injection; Ganglion cyst excision (Left, 01/26/2013); and Breast lumpectomy with radioactive seed localization (Right, 01/18/2019).   Her family history includes AAA (abdominal aortic aneurysm) in her mother; Alcohol abuse in her sister; COPD in her father.She reports that she quit smoking about 32 years ago. Her smoking use included cigarettes. She has never used smokeless tobacco. She reports that she does not drink alcohol or use drugs.  Current Outpatient Medications on File Prior to Visit  Medication Sig Dispense Refill  . Accu-Chek Softclix Lancets lancets Use to check blood sugar once a day.  DX  E11.9 100 each 1  . Blood Glucose Calibration (ACCU-CHEK AVIVA) SOLN Use to check control when opening a new bottle of strips.  DX E11.9 1 each 1  . Blood Glucose Monitoring Suppl  (ACCU-CHEK AVIVA PLUS) w/Device KIT Use to check blood sugar once a day.  DX E11.9 1 kit 0  . CALCIUM-VITAMIN D PO Take 600 mg by mouth daily.     . cetirizine (ZYRTEC) 10 MG tablet Take 10 mg by mouth as needed for allergies.    . fluticasone (FLONASE) 50 MCG/ACT nasal spray Place 2 sprays into the nose daily as needed.     . glucose blood (ACCU-CHEK AVIVA PLUS) test strip Use to check blood sugar once daily.  DX  E 11.9 100 strip 1  . latanoprost (XALATAN) 0.005 % ophthalmic solution Place 1 drop into both eyes daily.    . Multiple Vitamins-Minerals (MULTIVITAMIN PO) Take 1 tablet by mouth daily.     . Multiple Vitamins-Minerals (PRESERVISION AREDS) CAPS Take by mouth.    . Omega-3 Fatty Acids (FISH OIL PO) Take 1,200 mg by mouth daily.     . pantoprazole (PROTONIX) 40 MG tablet Take 1 tablet (40 mg total) by mouth daily. 90 tablet 3  . simvastatin (ZOCOR) 20 MG tablet TAKE 1 TABLET DAILY AT 6 P.M. 90 tablet 1   No current facility-administered medications on file prior to visit.     Objective:  Objective  Physical Exam Vitals and nursing note reviewed.  Constitutional:      Appearance: She is well-developed.  HENT:     Head: Normocephalic and atraumatic.  Eyes:     Conjunctiva/sclera: Conjunctivae normal.  Neck:     Thyroid: No thyromegaly.     Vascular: No carotid bruit or JVD.    Cardiovascular:     Rate and Rhythm: Normal rate and regular rhythm.     Heart sounds: Normal heart sounds. No murmur.  Pulmonary:     Effort: Pulmonary effort is normal. No respiratory distress.     Breath sounds: Normal breath sounds. No wheezing or rales.  Chest:     Chest wall: No tenderness.  Musculoskeletal:     Cervical back: Normal range of motion and neck supple.  Neurological:     Mental Status: She is alert and oriented to person, place, and time.    BP 130/78 (BP Location: Left Arm, Patient Position: Sitting, Cuff Size: Normal)   Pulse 98   Temp (!) 97.5 F (36.4 C) (Temporal)    Resp 18   Ht 5' 2.5" (1.588 m)   Wt 127 lb (57.6 kg)   SpO2 94%   BMI 22.86 kg/m  Wt Readings from Last 3 Encounters:  01/08/20 127 lb (57.6 kg)  09/11/19 125 lb 9.6 oz (57 kg)  05/22/19 125 lb (56.7 kg)     Lab Results  Component Value Date   WBC 9.6 01/01/2020   HGB 13.2 01/01/2020   HCT 39.9 01/01/2020   PLT 211.0 01/01/2020   GLUCOSE 139 (H) 01/01/2020   CHOL 198 01/01/2020   TRIG 82.0 01/01/2020   HDL 79.50 01/01/2020   LDLCALC 102 (H) 01/01/2020   ALT 16 01/01/2020   AST 23 01/01/2020   NA 139 01/01/2020   K 4.0 01/01/2020   CL 101 01/01/2020   CREATININE 1.00 01/01/2020   BUN 20 01/01/2020   CO2 29 01/01/2020   TSH 2.147 04/24/2013   HGBA1C 6.9 (H) 01/01/2020   MICROALBUR 0.8 05/22/2019    MM Breast Surgical Specimen  Result Date: 01/18/2019 CLINICAL DATA:  Status post seed localized RIGHT lumpectomy. EXAM: SPECIMEN RADIOGRAPH OF THE RIGHT BREAST COMPARISON:  01/17/2019 and her FINDINGS: Status post excision of the right breast. The radioactive seed and biopsy marker clip are present, completely intact, and were marked for pathology. IMPRESSION: Specimen radiograph of the right breast. Electronically Signed   By: Elizabeth  Brown M.D.   On: 01/18/2019 11:22     Assessment & Plan:  Plan  I have discontinued Briana Craig's famotidine. I am also having her start on famotidine. Additionally, I am having her maintain her CALCIUM-VITAMIN D PO, Multiple Vitamins-Minerals (MULTIVITAMIN PO), Omega-3 Fatty Acids (FISH OIL PO), cetirizine, fluticasone, PreserVision AREDS, latanoprost, Accu-Chek Softclix Lancets, Accu-Chek Aviva, Accu-Chek Aviva Plus, simvastatin, pantoprazole, and Accu-Chek Aviva Plus.  Meds ordered this encounter  Medications  . famotidine (PEPCID) 40 MG tablet    Sig: Take 1 tablet (40 mg total) by mouth at bedtime.    Dispense:  90 tablet    Refill:  1    Problem List Items Addressed This Visit      Unprioritized   Diet-controlled diabetes  mellitus (HCC)     hgba1c acceptable, minimize simple carbs. Increase exercise as tolerated. Continue current meds      GERD (gastroesophageal reflux disease) - Primary    con't protonix Inc pepcid 40 mg F/u gi       Relevant Medications   famotidine (PEPCID) 40 MG tablet   Hyperlipidemia associated with type 2 diabetes mellitus (HCC)    Encouraged heart healthy diet, increase exercise, avoid trans fats, consider a krill oil cap daily Lab Results  Component Value Date   CHOL 198 01/01/2020   HDL 79.50 01/01/2020   LDLCALC 102 (H) 01/01/2020   TRIG   82.0 01/01/2020   CHOLHDL 2 01/01/2020   Pt was not taking the statin when labs were done-- she ran out          Follow-up: Return in about 6 months (around 07/10/2020), or if symptoms worsen or fail to improve, for diabetes II, hyperlipidemia, hypertension.  Yvonne R Lowne Chase, DO     

## 2020-01-08 NOTE — Patient Instructions (Signed)
DASH Eating Plan DASH stands for "Dietary Approaches to Stop Hypertension." The DASH eating plan is a healthy eating plan that has been shown to reduce high blood pressure (hypertension). It may also reduce your risk for type 2 diabetes, heart disease, and stroke. The DASH eating plan may also help with weight loss. What are tips for following this plan?  General guidelines  Avoid eating more than 2,300 mg (milligrams) of salt (sodium) a day. If you have hypertension, you may need to reduce your sodium intake to 1,500 mg a day.  Limit alcohol intake to no more than 1 drink a day for nonpregnant women and 2 drinks a day for men. One drink equals 12 oz of beer, 5 oz of wine, or 1 oz of hard liquor.  Work with your health care provider to maintain a healthy body weight or to lose weight. Ask what an ideal weight is for you.  Get at least 30 minutes of exercise that causes your heart to beat faster (aerobic exercise) most days of the week. Activities may include walking, swimming, or biking.  Work with your health care provider or diet and nutrition specialist (dietitian) to adjust your eating plan to your individual calorie needs. Reading food labels   Check food labels for the amount of sodium per serving. Choose foods with less than 5 percent of the Daily Value of sodium. Generally, foods with less than 300 mg of sodium per serving fit into this eating plan.  To find whole grains, look for the word "whole" as the first word in the ingredient list. Shopping  Buy products labeled as "low-sodium" or "no salt added."  Buy fresh foods. Avoid canned foods and premade or frozen meals. Cooking  Avoid adding salt when cooking. Use salt-free seasonings or herbs instead of table salt or sea salt. Check with your health care provider or pharmacist before using salt substitutes.  Do not fry foods. Cook foods using healthy methods such as baking, boiling, grilling, and broiling instead.  Cook with  heart-healthy oils, such as olive, canola, soybean, or sunflower oil. Meal planning  Eat a balanced diet that includes: ? 5 or more servings of fruits and vegetables each day. At each meal, try to fill half of your plate with fruits and vegetables. ? Up to 6-8 servings of whole grains each day. ? Less than 6 oz of lean meat, poultry, or fish each day. A 3-oz serving of meat is about the same size as a deck of cards. One egg equals 1 oz. ? 2 servings of low-fat dairy each day. ? A serving of nuts, seeds, or beans 5 times each week. ? Heart-healthy fats. Healthy fats called Omega-3 fatty acids are found in foods such as flaxseeds and coldwater fish, like sardines, salmon, and mackerel.  Limit how much you eat of the following: ? Canned or prepackaged foods. ? Food that is high in trans fat, such as fried foods. ? Food that is high in saturated fat, such as fatty meat. ? Sweets, desserts, sugary drinks, and other foods with added sugar. ? Full-fat dairy products.  Do not salt foods before eating.  Try to eat at least 2 vegetarian meals each week.  Eat more home-cooked food and less restaurant, buffet, and fast food.  When eating at a restaurant, ask that your food be prepared with less salt or no salt, if possible. What foods are recommended? The items listed may not be a complete list. Talk with your dietitian about   what dietary choices are best for you. Grains Whole-grain or whole-wheat bread. Whole-grain or whole-wheat pasta. Brown rice. Oatmeal. Quinoa. Bulgur. Whole-grain and low-sodium cereals. Pita bread. Low-fat, low-sodium crackers. Whole-wheat flour tortillas. Vegetables Fresh or frozen vegetables (raw, steamed, roasted, or grilled). Low-sodium or reduced-sodium tomato and vegetable juice. Low-sodium or reduced-sodium tomato sauce and tomato paste. Low-sodium or reduced-sodium canned vegetables. Fruits All fresh, dried, or frozen fruit. Canned fruit in natural juice (without  added sugar). Meat and other protein foods Skinless chicken or turkey. Ground chicken or turkey. Pork with fat trimmed off. Fish and seafood. Egg whites. Dried beans, peas, or lentils. Unsalted nuts, nut butters, and seeds. Unsalted canned beans. Lean cuts of beef with fat trimmed off. Low-sodium, lean deli meat. Dairy Low-fat (1%) or fat-free (skim) milk. Fat-free, low-fat, or reduced-fat cheeses. Nonfat, low-sodium ricotta or cottage cheese. Low-fat or nonfat yogurt. Low-fat, low-sodium cheese. Fats and oils Soft margarine without trans fats. Vegetable oil. Low-fat, reduced-fat, or light mayonnaise and salad dressings (reduced-sodium). Canola, safflower, olive, soybean, and sunflower oils. Avocado. Seasoning and other foods Herbs. Spices. Seasoning mixes without salt. Unsalted popcorn and pretzels. Fat-free sweets. What foods are not recommended? The items listed may not be a complete list. Talk with your dietitian about what dietary choices are best for you. Grains Baked goods made with fat, such as croissants, muffins, or some breads. Dry pasta or rice meal packs. Vegetables Creamed or fried vegetables. Vegetables in a cheese sauce. Regular canned vegetables (not low-sodium or reduced-sodium). Regular canned tomato sauce and paste (not low-sodium or reduced-sodium). Regular tomato and vegetable juice (not low-sodium or reduced-sodium). Pickles. Olives. Fruits Canned fruit in a light or heavy syrup. Fried fruit. Fruit in cream or butter sauce. Meat and other protein foods Fatty cuts of meat. Ribs. Fried meat. Bacon. Sausage. Bologna and other processed lunch meats. Salami. Fatback. Hotdogs. Bratwurst. Salted nuts and seeds. Canned beans with added salt. Canned or smoked fish. Whole eggs or egg yolks. Chicken or turkey with skin. Dairy Whole or 2% milk, cream, and half-and-half. Whole or full-fat cream cheese. Whole-fat or sweetened yogurt. Full-fat cheese. Nondairy creamers. Whipped toppings.  Processed cheese and cheese spreads. Fats and oils Butter. Stick margarine. Lard. Shortening. Ghee. Bacon fat. Tropical oils, such as coconut, palm kernel, or palm oil. Seasoning and other foods Salted popcorn and pretzels. Onion salt, garlic salt, seasoned salt, table salt, and sea salt. Worcestershire sauce. Tartar sauce. Barbecue sauce. Teriyaki sauce. Soy sauce, including reduced-sodium. Steak sauce. Canned and packaged gravies. Fish sauce. Oyster sauce. Cocktail sauce. Horseradish that you find on the shelf. Ketchup. Mustard. Meat flavorings and tenderizers. Bouillon cubes. Hot sauce and Tabasco sauce. Premade or packaged marinades. Premade or packaged taco seasonings. Relishes. Regular salad dressings. Where to find more information:  National Heart, Lung, and Blood Institute: www.nhlbi.nih.gov  American Heart Association: www.heart.org Summary  The DASH eating plan is a healthy eating plan that has been shown to reduce high blood pressure (hypertension). It may also reduce your risk for type 2 diabetes, heart disease, and stroke.  With the DASH eating plan, you should limit salt (sodium) intake to 2,300 mg a day. If you have hypertension, you may need to reduce your sodium intake to 1,500 mg a day.  When on the DASH eating plan, aim to eat more fresh fruits and vegetables, whole grains, lean proteins, low-fat dairy, and heart-healthy fats.  Work with your health care provider or diet and nutrition specialist (dietitian) to adjust your eating plan to your   individual calorie needs. This information is not intended to replace advice given to you by your health care provider. Make sure you discuss any questions you have with your health care provider. Document Revised: 10/01/2017 Document Reviewed: 10/12/2016 Elsevier Patient Education  2020 [employer].  

## 2020-01-08 NOTE — Assessment & Plan Note (Addendum)
Encouraged heart healthy diet, increase exercise, avoid trans fats, consider a krill oil cap daily Lab Results  Component Value Date   CHOL 198 01/01/2020   HDL 79.50 01/01/2020   LDLCALC 102 (H) 01/01/2020   TRIG 82.0 01/01/2020   CHOLHDL 2 01/01/2020   Pt was not taking the statin when labs were done-- she ran out

## 2020-01-08 NOTE — Assessment & Plan Note (Signed)
con't protonix Inc pepcid 40 mg F/u gi

## 2020-01-18 DIAGNOSIS — C50011 Malignant neoplasm of nipple and areola, right female breast: Secondary | ICD-10-CM | POA: Diagnosis not present

## 2020-01-19 ENCOUNTER — Other Ambulatory Visit: Payer: Self-pay | Admitting: General Surgery

## 2020-01-19 DIAGNOSIS — Z853 Personal history of malignant neoplasm of breast: Secondary | ICD-10-CM

## 2020-02-08 ENCOUNTER — Other Ambulatory Visit: Payer: Self-pay | Admitting: General Surgery

## 2020-02-08 ENCOUNTER — Other Ambulatory Visit: Payer: Self-pay

## 2020-02-08 ENCOUNTER — Ambulatory Visit
Admission: RE | Admit: 2020-02-08 | Discharge: 2020-02-08 | Disposition: A | Discharge status: Home / Self Care | Payer: Medicare PPO | Source: Ambulatory Visit | Attending: General Surgery | Admitting: General Surgery

## 2020-02-08 DIAGNOSIS — Z853 Personal history of malignant neoplasm of breast: Secondary | ICD-10-CM

## 2020-02-08 DIAGNOSIS — N6489 Other specified disorders of breast: Secondary | ICD-10-CM | POA: Diagnosis not present

## 2020-02-08 DIAGNOSIS — R928 Other abnormal and inconclusive findings on diagnostic imaging of breast: Secondary | ICD-10-CM

## 2020-02-08 HISTORY — DX: Personal history of irradiation: Z92.3

## 2020-03-08 ENCOUNTER — Other Ambulatory Visit: Payer: Self-pay

## 2020-03-08 MED ORDER — TRUE METRIX AIR GLUCOSE METER DEVI: 0 refills | Status: AC

## 2020-03-08 MED ORDER — TRUEPLUS LANCETS 33G MISC: 11 refills | Status: AC

## 2020-03-08 MED ORDER — TRUE METRIX BLOOD GLUCOSE TEST VI STRP: ORAL_STRIP | 12 refills | Status: DC

## 2020-03-08 NOTE — Telephone Encounter (Signed)
Received fax from pharmacy requesting refill on diabetic supplies.

## 2020-03-12 ENCOUNTER — Telehealth: Payer: Self-pay | Admitting: Family Medicine

## 2020-03-12 NOTE — Telephone Encounter (Signed)
Caller: [facility]  Call Back # [phone]    Per [facility]   They haven't received the order for alcohol pads included with the diabetic supplies.    Please Advise

## 2020-03-13 ENCOUNTER — Other Ambulatory Visit: Payer: Self-pay

## 2020-03-13 DIAGNOSIS — E118 Type 2 diabetes mellitus with unspecified complications: Secondary | ICD-10-CM

## 2020-03-13 MED ORDER — ALCOHOL WIPES 70 % PADS: MEDICATED_PAD | 1 refills | Status: AC

## 2020-03-13 NOTE — Telephone Encounter (Signed)
Refill sent.

## 2020-03-22 ENCOUNTER — Other Ambulatory Visit: Payer: Self-pay

## 2020-03-22 MED ORDER — TRUE METRIX BLOOD GLUCOSE TEST VI STRP: ORAL_STRIP | 12 refills | Status: DC

## 2020-03-22 NOTE — Telephone Encounter (Signed)
Rx sent 

## 2020-03-22 NOTE — Telephone Encounter (Signed)
Patient called in to see if she can get a prescription refill for glucose blood (TRUE METRIX BLOOD GLUCOSE TEST) test strip [id number]    Please send it to [facility]  [place], [place] [address]  Phone:  [phone] Fax:  [phone]  DEA #:  --

## 2020-04-27 ENCOUNTER — Other Ambulatory Visit: Payer: Self-pay | Admitting: Family Medicine

## 2020-04-27 DIAGNOSIS — E785 Hyperlipidemia, unspecified: Secondary | ICD-10-CM

## 2020-05-15 ENCOUNTER — Other Ambulatory Visit: Payer: Self-pay | Admitting: Family Medicine

## 2020-05-15 DIAGNOSIS — K219 Gastro-esophageal reflux disease without esophagitis: Secondary | ICD-10-CM

## 2020-06-06 DIAGNOSIS — H26493 Other secondary cataract, bilateral: Secondary | ICD-10-CM | POA: Diagnosis not present

## 2020-06-06 DIAGNOSIS — B0239 Other herpes zoster eye disease: Secondary | ICD-10-CM | POA: Diagnosis not present

## 2020-06-06 DIAGNOSIS — H401131 Primary open-angle glaucoma, bilateral, mild stage: Secondary | ICD-10-CM | POA: Diagnosis not present

## 2020-06-06 DIAGNOSIS — H353132 Nonexudative age-related macular degeneration, bilateral, intermediate dry stage: Secondary | ICD-10-CM | POA: Diagnosis not present

## 2020-06-24 DIAGNOSIS — Z86000 Personal history of in-situ neoplasm of breast: Secondary | ICD-10-CM | POA: Diagnosis not present

## 2020-06-24 DIAGNOSIS — Z09 Encounter for follow-up examination after completed treatment for conditions other than malignant neoplasm: Secondary | ICD-10-CM | POA: Diagnosis not present

## 2020-06-24 DIAGNOSIS — D0511 Intraductal carcinoma in situ of right breast: Secondary | ICD-10-CM | POA: Diagnosis not present

## 2020-07-03 ENCOUNTER — Other Ambulatory Visit: Payer: Self-pay

## 2020-07-03 ENCOUNTER — Other Ambulatory Visit (INDEPENDENT_AMBULATORY_CARE_PROVIDER_SITE_OTHER): Payer: Medicare PPO

## 2020-07-03 DIAGNOSIS — E118 Type 2 diabetes mellitus with unspecified complications: Secondary | ICD-10-CM

## 2020-07-03 DIAGNOSIS — E1169 Type 2 diabetes mellitus with other specified complication: Secondary | ICD-10-CM | POA: Diagnosis not present

## 2020-07-03 DIAGNOSIS — E785 Hyperlipidemia, unspecified: Secondary | ICD-10-CM

## 2020-07-04 LAB — LIPID PANEL
Cholesterol: 207 mg/dL — ABNORMAL HIGH (ref <200)
HDL: 80 mg/dL (ref >=50)
LDL Cholesterol (Calc): 105 mg/dL (calc) — ABNORMAL HIGH
Non-HDL Cholesterol (Calc): 127 mg/dL (calc) (ref <130)
Total CHOL/HDL Ratio: 2.6 (calc) (ref <5.0)
Triglycerides: 127 mg/dL (ref <150)

## 2020-07-04 LAB — COMPREHENSIVE METABOLIC PANEL
AG Ratio: 1.4 (calc) (ref 1.0–2.5)
ALT: 17 U/L (ref 6–29)
AST: 23 U/L (ref 10–35)
Albumin: 4.4 g/dL (ref 3.6–5.1)
Alkaline phosphatase (APISO): 48 U/L (ref 37–153)
BUN/Creatinine Ratio: 18 (calc) (ref 6–22)
BUN: 21 mg/dL (ref 7–25)
CO2: 27 mmol/L (ref 20–32)
Calcium: 10.2 mg/dL (ref 8.6–10.4)
Chloride: 102 mmol/L (ref 98–110)
Creat: 1.2 mg/dL — ABNORMAL HIGH (ref 0.60–0.93)
Globulin: 3.2 g/dL (calc) (ref 1.9–3.7)
Glucose, Bld: 122 mg/dL — ABNORMAL HIGH (ref 65–99)
Potassium: 4.6 mmol/L (ref 3.5–5.3)
Sodium: 139 mmol/L (ref 135–146)
Total Bilirubin: 0.7 mg/dL (ref 0.2–1.2)
Total Protein: 7.6 g/dL (ref 6.1–8.1)

## 2020-07-04 LAB — HEMOGLOBIN A1C
Hgb A1c MFr Bld: 6.8 % of total Hgb — ABNORMAL HIGH (ref <5.7)
Mean Plasma Glucose: 148 (calc)
eAG (mmol/L): 8.2 (calc)

## 2020-07-11 ENCOUNTER — Other Ambulatory Visit: Payer: Self-pay

## 2020-07-11 ENCOUNTER — Ambulatory Visit (INDEPENDENT_AMBULATORY_CARE_PROVIDER_SITE_OTHER): Payer: Medicare PPO | Admitting: Family Medicine

## 2020-07-11 ENCOUNTER — Encounter: Payer: Self-pay | Admitting: Family Medicine

## 2020-07-11 VITALS — BP 110/84 | HR 68 | Temp 97.5°F | Resp 18 | Ht 62.5 in | Wt 125.4 lb

## 2020-07-11 DIAGNOSIS — E785 Hyperlipidemia, unspecified: Secondary | ICD-10-CM | POA: Diagnosis not present

## 2020-07-11 DIAGNOSIS — E1169 Type 2 diabetes mellitus with other specified complication: Secondary | ICD-10-CM | POA: Diagnosis not present

## 2020-07-11 DIAGNOSIS — E1165 Type 2 diabetes mellitus with hyperglycemia: Secondary | ICD-10-CM

## 2020-07-11 DIAGNOSIS — I10 Essential (primary) hypertension: Secondary | ICD-10-CM | POA: Diagnosis not present

## 2020-07-11 DIAGNOSIS — E119 Type 2 diabetes mellitus without complications: Secondary | ICD-10-CM | POA: Diagnosis not present

## 2020-07-11 NOTE — Assessment & Plan Note (Signed)
Tolerating statin, encouraged heart healthy diet, avoid trans fats, minimize simple carbs and saturated fats. Increase exercise as tolerated 

## 2020-07-11 NOTE — Patient Instructions (Signed)
Carbohydrate Counting for Diabetes Mellitus, Adult  Carbohydrate counting is a method of keeping track of how many carbohydrates you eat. Eating carbohydrates naturally increases the amount of sugar (glucose) in the blood. Counting how many carbohydrates you eat helps keep your blood glucose within normal limits, which helps you manage your diabetes (diabetes mellitus). It is important to know how many carbohydrates you can safely have in each meal. This is different for every person. A diet and nutrition specialist (registered dietitian) can help you make a meal plan and calculate how many carbohydrates you should have at each meal and snack. Carbohydrates are found in the following foods:  Grains, such as breads and cereals.  Dried beans and soy products.  Starchy vegetables, such as potatoes, peas, and corn.  Fruit and fruit juices.  Milk and yogurt.  Sweets and snack foods, such as cake, cookies, candy, chips, and soft drinks. How do I count carbohydrates? There are two ways to count carbohydrates in food. You can use either of the methods or a combination of both. Reading "Nutrition Facts" on packaged food The "Nutrition Facts" list is included on the labels of almost all packaged foods and beverages in the [place] It includes:  The serving size.  Information about nutrients in each serving, including the grams (g) of carbohydrate per serving. To use the "Nutrition Facts":  Decide how many servings you will have.  Multiply the number of servings by the number of carbohydrates per serving.  The resulting number is the total amount of carbohydrates that you will be having. Learning standard serving sizes of other foods When you eat carbohydrate foods that are not packaged or do not include "Nutrition Facts" on the label, you need to measure the servings in order to count the amount of carbohydrates:  Measure the foods that you will eat with a food scale or measuring cup, if  needed.  Decide how many standard-size servings you will eat.  Multiply the number of servings by 15. Most carbohydrate-rich foods have about 15 g of carbohydrates per serving. ? For example, if you eat 8 oz (170 g) of strawberries, you will have eaten 2 servings and 30 g of carbohydrates (2 servings x 15 g = 30 g).  For foods that have more than one food mixed, such as soups and casseroles, you must count the carbohydrates in each food that is included. The following list contains standard serving sizes of common carbohydrate-rich foods. Each of these servings has about 15 g of carbohydrates:   hamburger bun or  English muffin.   oz (15 mL) syrup.   oz (14 g) jelly.  1 slice of bread.  1 six-inch tortilla.  3 oz (85 g) cooked rice or pasta.  4 oz (113 g) cooked dried beans.  4 oz (113 g) starchy vegetable, such as peas, corn, or potatoes.  4 oz (113 g) hot cereal.  4 oz (113 g) mashed potatoes or  of a large baked potato.  4 oz (113 g) canned or frozen fruit.  4 oz (120 mL) fruit juice.  4-6 crackers.  6 chicken nuggets.  6 oz (170 g) unsweetened dry cereal.  6 oz (170 g) plain fat-free yogurt or yogurt sweetened with artificial sweeteners.  8 oz (240 mL) milk.  8 oz (170 g) fresh fruit or one small piece of fruit.  24 oz (680 g) popped popcorn. Example of carbohydrate counting Sample meal  3 oz (85 g) chicken breast.  6 oz (170 g)   brown rice.  4 oz (113 g) corn.  8 oz (240 mL) milk.  8 oz (170 g) strawberries with sugar-free whipped topping. Carbohydrate calculation 1. Identify the foods that contain carbohydrates: ? Rice. ? Corn. ? Milk. ? Strawberries. 2. Calculate how many servings you have of each food: ? 2 servings rice. ? 1 serving corn. ? 1 serving milk. ? 1 serving strawberries. 3. Multiply each number of servings by 15 g: ? 2 servings rice x 15 g = 30 g. ? 1 serving corn x 15 g = 15 g. ? 1 serving milk x 15 g = 15 g. ? 1  serving strawberries x 15 g = 15 g. 4. Add together all of the amounts to find the total grams of carbohydrates eaten: ? 30 g + 15 g + 15 g + 15 g = 75 g of carbohydrates total. Summary  Carbohydrate counting is a method of keeping track of how many carbohydrates you eat.  Eating carbohydrates naturally increases the amount of sugar (glucose) in the blood.  Counting how many carbohydrates you eat helps keep your blood glucose within normal limits, which helps you manage your diabetes.  A diet and nutrition specialist (registered dietitian) can help you make a meal plan and calculate how many carbohydrates you should have at each meal and snack. This information is not intended to replace advice given to you by your health care provider. Make sure you discuss any questions you have with your health care provider. Document Revised: 05/13/2017 Document Reviewed: 04/01/2016 Elsevier Patient Education  2020 [employer]  

## 2020-07-11 NOTE — Progress Notes (Signed)
Patient ID: Briana Craig, female    DOB: [birth year]  Age: 74 y.o. MRN: [id number]    Subjective:  Subjective  HPI [name] presents for f/u dm, chol and bp   HYPERTENSION   Blood pressure range-not checking   Chest pain- no      Dyspnea- no Lightheadedness- no   Edema- no  Other side effects - no   Medication compliance: good Low salt diet- yes    DIABETES    Blood Sugar ranges-98-110  Polyuria- no New Visual problems- no  Hypoglycemic symptoms- no  Other side effects-no Medication compliance - good Last eye exam- q6 mon--- next one in Oct  Foot exam- today    HYPERLIPIDEMIA  Medication compliance- good RUQ pain- no  Muscle aches- no Other side effects-no      Review of Systems  Constitutional: Negative for appetite change, diaphoresis, fatigue and unexpected weight change.  Eyes: Negative for pain, redness and visual disturbance.  Respiratory: Negative for cough, chest tightness, shortness of breath and wheezing.   Cardiovascular: Negative for chest pain, palpitations and leg swelling.  Endocrine: Negative for cold intolerance, heat intolerance, polydipsia, polyphagia and polyuria.  Genitourinary: Negative for difficulty urinating, dysuria and frequency.  Neurological: Negative for dizziness, light-headedness, numbness and headaches.    History Past Medical History:  Diagnosis Date  . Breast cancer ([place]) 12/2018   right DCIS  . Dysphagia   . Esophageal cancer ([place])   . GERD (gastroesophageal reflux disease)   . Hyperlipemia   . Hypertension    no meds now  . Impaired fasting blood sugar   . Personal history of radiation therapy   . PONV (postoperative nausea and vomiting)   . Pre-diabetes     She has a past surgical history that includes Esophagus surgery; Esophagogastroduodenoscopy (11/17/2011); Eye surgery; Direct laryngoscopy with botox injection; Ganglion cyst excision (Left, 01/26/2013); Breast lumpectomy with radioactive seed  localization (Right, 01/18/2019); Breast biopsy (Right, 12/23/2018); and Breast lumpectomy (Right, 01/18/2019).   Her family history includes AAA (abdominal aortic aneurysm) in her mother; Alcohol abuse in her sister; COPD in her father.She reports that she quit smoking about 32 years ago. Her smoking use included cigarettes. She has never used smokeless tobacco. She reports that she does not drink alcohol and does not use drugs.  Current Outpatient Medications on File Prior to Visit  Medication Sig Dispense Refill  . Accu-Chek Softclix Lancets lancets Use to check blood sugar once a day.  DX  E11.9 100 each 1  . Alcohol Swabs (ALCOHOL WIPES) 70 % PADS To use to check sugars 100 each 1  . Blood Glucose Calibration (ACCU-CHEK AVIVA) SOLN Use to check control when opening a new bottle of strips.  DX E11.9 1 each 1  . Blood Glucose Monitoring Suppl (ACCU-CHEK AVIVA PLUS) w/Device KIT Use to check blood sugar once a day.  DX E11.9 1 kit 0  . Blood Glucose Monitoring Suppl (TRUE METRIX AIR GLUCOSE METER) DEVI Use as directed to check glucose twice daily. E11.9 1 each 0  . CALCIUM-VITAMIN D PO Take 600 mg by mouth daily.     . cetirizine (ZYRTEC) 10 MG tablet Take 10 mg by mouth as needed for allergies.    . famotidine (PEPCID) 40 MG tablet TAKE 1 TABLET BY MOUTH AT BEDTIME. 90 tablet 1  . fluticasone (FLONASE) 50 MCG/ACT nasal spray Place 2 sprays into the nose daily as needed.     Marland Kitchen glucose blood (ACCU-CHEK AVIVA PLUS) test strip  Use to check blood sugar once daily.  DX  E 11.9 100 strip 1  . glucose blood (TRUE METRIX BLOOD GLUCOSE TEST) test strip Use as instructed 100 each 12  . latanoprost (XALATAN) 0.005 % ophthalmic solution Place 1 drop into both eyes daily.    . Multiple Vitamins-Minerals (MULTIVITAMIN PO) Take 1 tablet by mouth daily.     . Multiple Vitamins-Minerals (PRESERVISION AREDS) CAPS Take by mouth.    . Omega-3 Fatty Acids (FISH OIL PO) Take 1,200 mg by mouth daily.     .  pantoprazole (PROTONIX) 40 MG tablet Take 1 tablet (40 mg total) by mouth daily. 90 tablet 3  . simvastatin (ZOCOR) 20 MG tablet TAKE 1 TABLET DAILY AT 6 P.M. 90 tablet 1  . TRUEplus Lancets 33G MISC Use as directed. Check glucose up to twice daily. E11.9 100 each 11   No current facility-administered medications on file prior to visit.     Objective:  Objective  Physical Exam Vitals and nursing note reviewed.  Constitutional:      Appearance: She is well-developed.  HENT:     Head: Normocephalic and atraumatic.  Eyes:     Conjunctiva/sclera: Conjunctivae normal.  Neck:     Thyroid: No thyromegaly.     Vascular: No carotid bruit or JVD.  Cardiovascular:     Rate and Rhythm: Normal rate and regular rhythm.     Heart sounds: Normal heart sounds. No murmur heard.   Pulmonary:     Effort: Pulmonary effort is normal. No respiratory distress.     Breath sounds: Normal breath sounds. No wheezing or rales.  Chest:     Chest wall: No tenderness.  Musculoskeletal:     Cervical back: Normal range of motion and neck supple.  Neurological:     Mental Status: She is alert and oriented to person, place, and time.    BP 110/84 (BP Location: Right Arm, Patient Position: Sitting, Cuff Size: Normal)   Pulse 68   Temp (!) 97.5 F (36.4 C) (Oral)   Resp 18   Ht 5' 2.5" (1.588 m)   Wt 125 lb 6.4 oz (56.9 kg)   SpO2 92%   BMI 22.57 kg/m  Wt Readings from Last 3 Encounters:  07/11/20 125 lb 6.4 oz (56.9 kg)  01/08/20 127 lb (57.6 kg)  09/11/19 125 lb 9.6 oz (57 kg)     Lab Results  Component Value Date   WBC 9.6 01/01/2020   HGB 13.2 01/01/2020   HCT 39.9 01/01/2020   PLT 211.0 01/01/2020   GLUCOSE 122 (H) 07/03/2020   CHOL 207 (H) 07/03/2020   TRIG 127 07/03/2020   HDL 80 07/03/2020   LDLCALC 105 (H) 07/03/2020   ALT 17 07/03/2020   AST 23 07/03/2020   NA 139 07/03/2020   K 4.6 07/03/2020   CL 102 07/03/2020   CREATININE 1.20 (H) 07/03/2020   BUN 21 07/03/2020   CO2 27  07/03/2020   TSH 2.147 04/24/2013   HGBA1C 6.8 (H) 07/03/2020   MICROALBUR 0.8 05/22/2019    US BREAST LTD UNI RIGHT INC AXILLA  Result Date: 02/08/2020 CLINICAL DATA:  Status post right lumpectomy and radiation therapy breast cancer in 2020. EXAM: DIGITAL DIAGNOSTIC BILATERAL MAMMOGRAM WITH CAD AND TOMO ULTRASOUND RIGHT BREAST COMPARISON:  Previous examinations. ACR Breast Density Category b: There are scattered areas of fibroglandular density. FINDINGS: Interval post lumpectomy changes on the right. At the posterior, superior aspect of the lumpectomy bed, there is a small  irregular asymmetry not seen on preoperative examinations. No findings elsewhere in either breast suspicious for malignancy. Mammographic images were processed with CAD. On physical exam, no mass is palpable in the upper outer right breast at the location of the small asymmetry. Targeted ultrasound is performed, showing a small island of fibroglandular tissue with peaks and valleys in an area of postsurgical scarring in the posterior aspect of the upper-outer right breast. IMPRESSION: 1. Interval post lumpectomy changes on the right with a small irregular asymmetry in the upper outer quadrant with a probable corresponding small island of irregular fibroglandular tissue at ultrasound. 2. No evidence of malignancy elsewhere in either breast. RECOMMENDATION: Right diagnostic mammogram and possible right breast ultrasound in 6 months. I have discussed the findings and recommendations with the patient. If applicable, a reminder letter will be sent to the patient regarding the next appointment. BI-RADS CATEGORY  3: Probably benign. Electronically Signed   By: Claudie Revering M.D.   On: 02/08/2020 15:18   MM DIAG BREAST TOMO BILATERAL  Result Date: 02/08/2020 CLINICAL DATA:  Status post right lumpectomy and radiation therapy breast cancer in 2020. EXAM: DIGITAL DIAGNOSTIC BILATERAL MAMMOGRAM WITH CAD AND TOMO ULTRASOUND RIGHT BREAST COMPARISON:   Previous examinations. ACR Breast Density Category b: There are scattered areas of fibroglandular density. FINDINGS: Interval post lumpectomy changes on the right. At the posterior, superior aspect of the lumpectomy bed, there is a small irregular asymmetry not seen on preoperative examinations. No findings elsewhere in either breast suspicious for malignancy. Mammographic images were processed with CAD. On physical exam, no mass is palpable in the upper outer right breast at the location of the small asymmetry. Targeted ultrasound is performed, showing a small island of fibroglandular tissue with peaks and valleys in an area of postsurgical scarring in the posterior aspect of the upper-outer right breast. IMPRESSION: 1. Interval post lumpectomy changes on the right with a small irregular asymmetry in the upper outer quadrant with a probable corresponding small island of irregular fibroglandular tissue at ultrasound. 2. No evidence of malignancy elsewhere in either breast. RECOMMENDATION: Right diagnostic mammogram and possible right breast ultrasound in 6 months. I have discussed the findings and recommendations with the patient. If applicable, a reminder letter will be sent to the patient regarding the next appointment. BI-RADS CATEGORY  3: Probably benign. Electronically Signed   By: Claudie Revering M.D.   On: 02/08/2020 15:18     Assessment & Plan:  Plan  I am having [name] maintain her CALCIUM-VITAMIN D PO, Multiple Vitamins-Minerals (MULTIVITAMIN PO), Omega-3 Fatty Acids (FISH OIL PO), cetirizine, fluticasone, PreserVision AREDS, latanoprost, Accu-Chek Softclix Lancets, Accu-Chek Aviva, Accu-Chek Aviva Plus, pantoprazole, Accu-Chek Aviva Plus, TRUEplus Lancets 33G, True Metrix Air Glucose Meter, Alcohol Wipes, True Metrix Blood Glucose Test, simvastatin, and famotidine.  No orders of the defined types were placed in this encounter.   Problem List Items Addressed This Visit      Unprioritized    Diet-controlled diabetes mellitus ([place])    con't diet  Check labs  hgba1c to be checked , minimize simple carbs. Increase exercise as tolerated. Continue current meds       Hyperlipidemia associated with type 2 diabetes mellitus ([place])    Tolerating statin, encouraged heart healthy diet, avoid trans fats, minimize simple carbs and saturated fats. Increase exercise as tolerated      Relevant Orders   Comprehensive metabolic panel   Hemoglobin A1c   Lipid panel   Microalbumin / creatinine urine ratio  Other Visit Diagnoses    Uncontrolled type 2 diabetes mellitus with hyperglycemia ([place])    -  Primary   Relevant Orders   Comprehensive metabolic panel   Hemoglobin A1c   Microalbumin / creatinine urine ratio   Essential hypertension       Relevant Orders   Comprehensive metabolic panel   Microalbumin / creatinine urine ratio      Follow-up: Return in about 6 months (around 01/08/2021), or if symptoms worsen or fail to improve, for -.  Ann Held, DO

## 2020-07-11 NOTE — Assessment & Plan Note (Signed)
con't diet  Check labs  hgba1c to be checked , minimize simple carbs. Increase exercise as tolerated. Continue current meds

## 2020-08-02 ENCOUNTER — Ambulatory Visit: Payer: Medicare PPO | Attending: Internal Medicine

## 2020-08-02 ENCOUNTER — Other Ambulatory Visit (HOSPITAL_BASED_OUTPATIENT_CLINIC_OR_DEPARTMENT_OTHER): Payer: Self-pay | Admitting: Internal Medicine

## 2020-08-02 DIAGNOSIS — Z23 Encounter for immunization: Secondary | ICD-10-CM

## 2020-08-02 NOTE — Progress Notes (Signed)
   Covid-19 Vaccination Clinic  Name:  Tomeika Weinmann    MRN: [id number] DOB: 1945/11/22  08/02/2020  Ms. Feltner was observed post Covid-19 immunization for 15 minutes without incident. She was provided with Vaccine Information Sheet and instruction to access the V-Safe system. Vaccinated By: Sabra Heck.  Ms. Haran was instructed to call 911 with any severe reactions post vaccine: Marland Kitchen Difficulty breathing  . Swelling of face and throat  . A fast heartbeat  . A bad rash all over body  . Dizziness and weakness

## 2020-08-12 ENCOUNTER — Other Ambulatory Visit: Payer: Medicare PPO

## 2020-09-17 ENCOUNTER — Other Ambulatory Visit: Payer: Self-pay | Admitting: General Surgery

## 2020-09-17 ENCOUNTER — Other Ambulatory Visit: Payer: Self-pay

## 2020-09-17 ENCOUNTER — Ambulatory Visit: Payer: Medicare PPO

## 2020-09-17 ENCOUNTER — Ambulatory Visit
Admission: RE | Admit: 2020-09-17 | Discharge: 2020-09-17 | Disposition: A | Discharge status: Home / Self Care | Payer: Medicare PPO | Source: Ambulatory Visit | Attending: General Surgery | Admitting: General Surgery

## 2020-09-17 DIAGNOSIS — Z853 Personal history of malignant neoplasm of breast: Secondary | ICD-10-CM | POA: Diagnosis not present

## 2020-09-17 DIAGNOSIS — R928 Other abnormal and inconclusive findings on diagnostic imaging of breast: Secondary | ICD-10-CM | POA: Diagnosis not present

## 2020-10-08 NOTE — Addendum Note (Signed)
Addended by: Kelle Darting A on: 10/08/2020 01:24 PM   Modules accepted: Orders

## 2020-10-10 ENCOUNTER — Other Ambulatory Visit: Payer: Self-pay

## 2020-10-10 ENCOUNTER — Other Ambulatory Visit (INDEPENDENT_AMBULATORY_CARE_PROVIDER_SITE_OTHER): Payer: Medicare PPO

## 2020-10-10 DIAGNOSIS — I10 Essential (primary) hypertension: Secondary | ICD-10-CM

## 2020-10-10 DIAGNOSIS — E1165 Type 2 diabetes mellitus with hyperglycemia: Secondary | ICD-10-CM | POA: Diagnosis not present

## 2020-10-10 DIAGNOSIS — E785 Hyperlipidemia, unspecified: Secondary | ICD-10-CM

## 2020-10-10 DIAGNOSIS — E1169 Type 2 diabetes mellitus with other specified complication: Secondary | ICD-10-CM | POA: Diagnosis not present

## 2020-10-10 LAB — COMPREHENSIVE METABOLIC PANEL
ALT: 15 U/L (ref 0–35)
AST: 21 U/L (ref 0–37)
Albumin: 4.3 g/dL (ref 3.5–5.2)
Alkaline Phosphatase: 50 U/L (ref 39–117)
BUN: 19 mg/dL (ref 6–23)
CO2: 30 mEq/L (ref 19–32)
Calcium: 9.6 mg/dL (ref 8.4–10.5)
Chloride: 102 mEq/L (ref 96–112)
Creatinine, Ser: 1.02 mg/dL (ref 0.40–1.20)
GFR: 53.63 mL/min — ABNORMAL LOW (ref >60.00)
Glucose, Bld: 117 mg/dL — ABNORMAL HIGH (ref 70–99)
Potassium: 4.3 mEq/L (ref 3.5–5.1)
Sodium: 139 mEq/L (ref 135–145)
Total Bilirubin: 0.6 mg/dL (ref 0.2–1.2)
Total Protein: 7.3 g/dL (ref 6.0–8.3)

## 2020-10-10 LAB — LIPID PANEL
Cholesterol: 198 mg/dL (ref 0–200)
HDL: 86.4 mg/dL (ref >39.00)
LDL Cholesterol: 93 mg/dL (ref 0–99)
NonHDL: 111.93
Total CHOL/HDL Ratio: 2
Triglycerides: 96 mg/dL (ref 0.0–149.0)
VLDL: 19.2 mg/dL (ref 0.0–40.0)

## 2020-10-10 LAB — MICROALBUMIN / CREATININE URINE RATIO
Creatinine,U: 41.8 mg/dL
Microalb Creat Ratio: 2.8 mg/g (ref 0.0–30.0)
Microalb, Ur: 1.2 mg/dL (ref 0.0–1.9)

## 2020-10-10 LAB — HEMOGLOBIN A1C: Hgb A1c MFr Bld: 6.8 % — ABNORMAL HIGH (ref 4.6–6.5)

## 2020-12-12 DIAGNOSIS — H401131 Primary open-angle glaucoma, bilateral, mild stage: Secondary | ICD-10-CM | POA: Diagnosis not present

## 2020-12-12 DIAGNOSIS — H353132 Nonexudative age-related macular degeneration, bilateral, intermediate dry stage: Secondary | ICD-10-CM | POA: Diagnosis not present

## 2020-12-12 DIAGNOSIS — H26493 Other secondary cataract, bilateral: Secondary | ICD-10-CM | POA: Diagnosis not present

## 2020-12-19 ENCOUNTER — Ambulatory Visit (INDEPENDENT_AMBULATORY_CARE_PROVIDER_SITE_OTHER): Payer: Medicare PPO

## 2020-12-19 VITALS — Ht 62.5 in | Wt 120.0 lb

## 2020-12-19 DIAGNOSIS — Z0000 Encounter for general adult medical examination without abnormal findings: Secondary | ICD-10-CM

## 2020-12-19 NOTE — Patient Instructions (Signed)
Briana Craig , Thank you for taking time to complete your Medicare Wellness Visit. I appreciate your ongoing commitment to your health goals. Please review the following plan we discussed and let me know if I can assist you in the future.   Screening recommendations/referrals: Colonoscopy: No longer required Mammogram: Scheduled for 03/18/2021 Bone Density: Declined today. Please call the office to schedule if you change your mind. Recommended yearly ophthalmology/optometry visit for glaucoma screening and checkup Recommended yearly dental visit for hygiene and checkup  Vaccinations: Influenza vaccine: Up to date Pneumococcal vaccine: Completed vaccines Tdap vaccine: Up to date-Due-05/02/2023 Shingles vaccine: Completed vaccines Covid-19:Completed vaccines  Advanced directives: Please bring a copy for your chart  Conditions/risks identified: See problem list  Next appointment: Follow up in one year for your annual wellness visit    Preventive Care 65 Years and Older, Female Preventive care refers to lifestyle choices and visits with your health care provider that can promote health and wellness. What does preventive care include?  A yearly physical exam. This is also called an annual well check.  Dental exams once or twice a year.  Routine eye exams. Ask your health care provider how often you should have your eyes checked.  Personal lifestyle choices, including:  Daily care of your teeth and gums.  Regular physical activity.  Eating a healthy diet.  Avoiding tobacco and drug use.  Limiting alcohol use.  Practicing safe sex.  Taking low-dose aspirin every day.  Taking vitamin and mineral supplements as recommended by your health care provider. What happens during an annual well check? The services and screenings done by your health care provider during your annual well check will depend on your age, overall health, lifestyle risk factors, and family history of  disease. Counseling  Your health care provider may ask you questions about your:  Alcohol use.  Tobacco use.  Drug use.  Emotional well-being.  Home and relationship well-being.  Sexual activity.  Eating habits.  History of falls.  Memory and ability to understand (cognition).  Work and work Statistician.  Reproductive health. Screening  You may have the following tests or measurements:  Height, weight, and BMI.  Blood pressure.  Lipid and cholesterol levels. These may be checked every 5 years, or more frequently if you are over 71 years old.  Skin check.  Lung cancer screening. You may have this screening every year starting at age 25 if you have a 30-pack-year history of smoking and currently smoke or have quit within the past 15 years.  Fecal occult blood test (FOBT) of the stool. You may have this test every year starting at age 32.  Flexible sigmoidoscopy or colonoscopy. You may have a sigmoidoscopy every 5 years or a colonoscopy every 10 years starting at age 73.  Hepatitis C blood test.  Hepatitis B blood test.  Sexually transmitted disease (STD) testing.  Diabetes screening. This is done by checking your blood sugar (glucose) after you have not eaten for a while (fasting). You may have this done every 1-3 years.  Bone density scan. This is done to screen for osteoporosis. You may have this done starting at age 33.  Mammogram. This may be done every 1-2 years. Talk to your health care provider about how often you should have regular mammograms. Talk with your health care provider about your test results, treatment options, and if necessary, the need for more tests. Vaccines  Your health care provider may recommend certain vaccines, such as:  Influenza vaccine. This  is recommended every year.  Tetanus, diphtheria, and acellular pertussis (Tdap, Td) vaccine. You may need a Td booster every 10 years.  Zoster vaccine. You may need this after age  83.  Pneumococcal 13-valent conjugate (PCV13) vaccine. One dose is recommended after age 49.  Pneumococcal polysaccharide (PPSV23) vaccine. One dose is recommended after age 21. Talk to your health care provider about which screenings and vaccines you need and how often you need them. This information is not intended to replace advice given to you by your health care provider. Make sure you discuss any questions you have with your health care provider. Document Released: 11/15/2015 Document Revised: 07/08/2016 Document Reviewed: 08/20/2015 Elsevier Interactive Patient Education  2017 [place] Prevention in the Home Falls can cause injuries. They can happen to people of all ages. There are many things you can do to make your home safe and to help prevent falls. What can I do on the outside of my home?  Regularly fix the edges of walkways and driveways and fix any cracks.  Remove anything that might make you trip as you walk through a door, such as a raised step or threshold.  Trim any bushes or trees on the path to your home.  Use bright outdoor lighting.  Clear any walking paths of anything that might make someone trip, such as rocks or tools.  Regularly check to see if handrails are loose or broken. Make sure that both sides of any steps have handrails.  Any raised decks and porches should have guardrails on the edges.  Have any leaves, snow, or ice cleared regularly.  Use sand or salt on walking paths during winter.  Clean up any spills in your garage right away. This includes oil or grease spills. What can I do in the bathroom?  Use night lights.  Install grab bars by the toilet and in the tub and shower. Do not use towel bars as grab bars.  Use non-skid mats or decals in the tub or shower.  If you need to sit down in the shower, use a plastic, non-slip stool.  Keep the floor dry. Clean up any water that spills on the floor as soon as it happens.  Remove  soap buildup in the tub or shower regularly.  Attach bath mats securely with double-sided non-slip rug tape.  Do not have throw rugs and other things on the floor that can make you trip. What can I do in the bedroom?  Use night lights.  Make sure that you have a light by your bed that is easy to reach.  Do not use any sheets or blankets that are too big for your bed. They should not hang down onto the floor.  Have a firm chair that has side arms. You can use this for support while you get dressed.  Do not have throw rugs and other things on the floor that can make you trip. What can I do in the kitchen?  Clean up any spills right away.  Avoid walking on wet floors.  Keep items that you use a lot in easy-to-reach places.  If you need to reach something above you, use a strong step stool that has a grab bar.  Keep electrical cords out of the way.  Do not use floor polish or wax that makes floors slippery. If you must use wax, use non-skid floor wax.  Do not have throw rugs and other things on the floor that can make you  trip. What can I do with my stairs?  Do not leave any items on the stairs.  Make sure that there are handrails on both sides of the stairs and use them. Fix handrails that are broken or loose. Make sure that handrails are as long as the stairways.  Check any carpeting to make sure that it is firmly attached to the stairs. Fix any carpet that is loose or worn.  Avoid having throw rugs at the top or bottom of the stairs. If you do have throw rugs, attach them to the floor with carpet tape.  Make sure that you have a light switch at the top of the stairs and the bottom of the stairs. If you do not have them, ask someone to add them for you. What else can I do to help prevent falls?  Wear shoes that:  Do not have high heels.  Have rubber bottoms.  Are comfortable and fit you well.  Are closed at the toe. Do not wear sandals.  If you use a  stepladder:  Make sure that it is fully opened. Do not climb a closed stepladder.  Make sure that both sides of the stepladder are locked into place.  Ask someone to hold it for you, if possible.  Clearly mark and make sure that you can see:  Any grab bars or handrails.  First and last steps.  Where the edge of each step is.  Use tools that help you move around (mobility aids) if they are needed. These include:  Canes.  Walkers.  Scooters.  Crutches.  Turn on the lights when you go into a dark area. Replace any light bulbs as soon as they burn out.  Set up your furniture so you have a clear path. Avoid moving your furniture around.  If any of your floors are uneven, fix them.  If there are any pets around you, be aware of where they are.  Review your medicines with your doctor. Some medicines can make you feel dizzy. This can increase your chance of falling. Ask your doctor what other things that you can do to help prevent falls. This information is not intended to replace advice given to you by your health care provider. Make sure you discuss any questions you have with your health care provider. Document Released: 08/15/2009 Document Revised: 03/26/2016 Document Reviewed: 11/23/2014 Elsevier Interactive Patient Education  2017 [employer].

## 2020-12-19 NOTE — Progress Notes (Signed)
Subjective:   Briana Craig is a 75 y.o. female who presents for Medicare Annual (Subsequent) preventive examination.  I connected with Briana Craig today by telephone and verified that I am speaking with the correct person using two identifiers. Location patient: home Location provider: work Persons participating in the virtual visit: patient, Marine scientist.    I discussed the limitations, risks, security and privacy concerns of performing an evaluation and management service by telephone and the availability of in person appointments. I also discussed with the patient that there may be a patient responsible charge related to this service. The patient expressed understanding and verbally consented to this telephonic visit.    Interactive audio and video telecommunications were attempted between this provider and patient, however failed, due to patient having technical difficulties OR patient did not have access to video capability.  We continued and completed visit with audio only.  Some vital signs may be absent or patient reported.   Time Spent with patient on telephone encounter: 20 minutes   Review of Systems     Cardiac Risk Factors include: advanced age (>77mn, >>35women);diabetes mellitus;dyslipidemia     Objective:    Today's Vitals   12/19/20 1517  Weight: 120 lb (54.4 kg)  Height: 5' 2.5" (1.588 m)   Body mass index is 21.6 kg/m.  Advanced Directives 12/19/2020 11/24/2019 01/18/2019 01/09/2019 12/11/2016 11/25/2015 09/25/2014  Does Patient Have a Medical Advance Directive? _0  Yes Yes  Type of Advance Directive Living will [place] will [place] will Living will [place]Living will [place]Living will [place]Living will  Does patient want to make changes to medical advance directive? - No - Patient declined No - Patient declined No - Patient declined -  No - Patient declined -  Copy of Healthcare Power of Attorney in Chart? - No - copy requested No - copy requested No - copy requested No - copy requested No - copy requested No - copy requested  Would patient like information on creating a medical advance directive? - - - - No - Patient declined - -  Pre-existing out of facility DNR order (yellow form or pink MOST form) - - - - - - -    Current Medications (verified) Outpatient Encounter Medications as of 12/19/2020  Medication Sig  . Accu-Chek Softclix Lancets lancets Use to check blood sugar once a day.  DX  E11.9  . Alcohol Swabs (ALCOHOL WIPES) 70 % PADS To use to check sugars  . Blood Glucose Calibration (ACCU-CHEK AVIVA) SOLN Use to check control when opening a new bottle of strips.  DX E11.9  . Blood Glucose Monitoring Suppl (ACCU-CHEK AVIVA PLUS) w/Device KIT Use to check blood sugar once a day.  DX E11.9  . Blood Glucose Monitoring Suppl (TRUE METRIX AIR GLUCOSE METER) DEVI Use as directed to check glucose twice daily. E11.9  . CALCIUM-VITAMIN D PO Take 600 mg by mouth daily.  . cetirizine (ZYRTEC) 10 MG tablet Take 10 mg by mouth as needed for allergies.  . famotidine (PEPCID) 40 MG tablet TAKE 1 TABLET BY MOUTH AT BEDTIME.  . fluticasone (FLONASE) 50 MCG/ACT nasal spray Place 2 sprays into the nose daily as needed.   .Marland Kitchenglucose blood (ACCU-CHEK AVIVA PLUS) test strip Use to check blood sugar once daily.  DX  E 11.9  . glucose blood (TRUE METRIX BLOOD GLUCOSE TEST) test strip Use as instructed  .  latanoprost (XALATAN) 0.005 % ophthalmic solution Place 1 drop into both eyes daily.  . Multiple Vitamins-Minerals (MULTIVITAMIN PO) Take 1 tablet by mouth daily.  . Multiple Vitamins-Minerals (PRESERVISION AREDS) CAPS Take by mouth.  . Omega-3 Fatty Acids (FISH OIL PO) Take 1,200 mg by mouth daily.  . pantoprazole (PROTONIX) 40 MG tablet Take 1 tablet (40 mg total) by mouth daily.  . simvastatin (ZOCOR) 20 MG tablet TAKE 1 TABLET DAILY AT 6  P.M.  . TRUEplus Lancets 33G MISC Use as directed. Check glucose up to twice daily. E11.9   No facility-administered encounter medications on file as of 12/19/2020.    Allergies (verified) Taxol [paclitaxel], Tramadol, Carboplatin, Codeine, Compazine, Compazine  [prochlorperazine maleate], Penicillins, and Prochlorperazine edisylate   History: Past Medical History:  Diagnosis Date  . Breast cancer ([place]) 12/2018   right DCIS  . Dysphagia   . Esophageal cancer ([place])   . GERD (gastroesophageal reflux disease)   . Hyperlipemia   . Hypertension    no meds now  . Impaired fasting blood sugar   . Personal history of radiation therapy   . PONV (postoperative nausea and vomiting)   . Pre-diabetes    Past Surgical History:  Procedure Laterality Date  . BREAST BIOPSY Right 12/23/2018  . BREAST LUMPECTOMY Right 01/18/2019  . BREAST LUMPECTOMY WITH RADIOACTIVE SEED LOCALIZATION Right 01/18/2019   Procedure: RIGHT BREAST CENTRAL LUMPECTOMY WITH RADIOACTIVE SEED LOCALIZATION;  Surgeon: Rolm Bookbinder, MD;  Location: [facility];  Service: General;  Laterality: Right;  . DIRECT LARYNGOSCOPY WITH BOTOX INJECTION     tx paralysed vocal cord  . ESOPHAGOGASTRODUODENOSCOPY  11/17/2011   Procedure: ESOPHAGOGASTRODUODENOSCOPY (EGD);  Surgeon: Owens Loffler, MD;  Location: [facility];  Service: Endoscopy;  Laterality: N/A;  . ESOPHAGUS SURGERY    . EYE SURGERY     both cataracts  . GANGLION CYST EXCISION Left 01/26/2013   Procedure: LEFT FLEXOR CARPI RADIALIS  RELEASE AND SCAPHO TRAPEZIAL TRAPEZOID DEBRIDEMENT ;  Surgeon: Tennis Must, MD;  Location: [facility];  Service: Orthopedics;  Laterality: Left;   Family History  Problem Relation Age of Onset  . Alcohol abuse Sister   . AAA (abdominal aortic aneurysm) Mother   . COPD Father   . Colon cancer Neg Hx    Social History   Socioeconomic History  . Marital status: Married    Spouse name: Sonia Side  . Number  of children: 0  . Years of education: 72  . Highest education level: Not on file  Occupational History  . Occupation: RETIRED     Employer: OTHER  Tobacco Use  . Smoking status: Former Smoker    Types: Cigarettes    Quit date: 08/30/1987    Years since quitting: 33.3  . Smokeless tobacco: Never Used  Substance and Sexual Activity  . Alcohol use: No  . Drug use: Never  . Sexual activity: Yes    Partners: Male    Birth control/protection: Post-menopausal  Other Topics Concern  . Not on file  Social History Narrative   Marital Status:  Married Sonia Side)   Children:  None    Pets: Dog (1)    Living Situation: Lives with husband.   Occupation:  Retired Visual merchandiser)    Education: Master's Degree    Tobacco Use/Exposure:  Formal Smoker    Alcohol Use:  Occasional   Drug Use:  None   Diet:  Regular   Exercise:  Walking 1 mile 5 times per week.  Hobbies:  Reading                Social Determinants of Health   Financial Resource Strain: Low Risk   . Difficulty of Paying Living Expenses: Not hard at all  Food Insecurity: No Food Insecurity  . Worried About Charity fundraiser in the Last Year: Never true  . Ran Out of Food in the Last Year: Never true  Transportation Needs: No Transportation Needs  . Lack of Transportation (Medical): No  . Lack of Transportation (Non-Medical): No  Physical Activity: Insufficiently Active  . Days of Exercise per Week: 4 days  . Minutes of Exercise per Session: 30 min  Stress: No Stress Concern Present  . Feeling of Stress : Not at all  Social Connections: Moderately Integrated  . Frequency of Communication with Friends and Family: More than three times a week  . Frequency of Social Gatherings with Friends and Family: Once a week  . Attends Religious Services: 1 to 4 times per year  . Active Member of Clubs or Organizations: No  . Attends Archivist Meetings: Never  . Marital Status: Married    Tobacco Counseling Counseling  given: Not Answered   Clinical Intake:  Pre-visit preparation completed: Yes  Pain : No/denies pain     Nutritional Status: BMI of 19-24  Normal Nutritional Risks: None Diabetes: Yes CBG done?: No Did pt. bring in CBG monitor from home?: No (phone visit)  How often do you need to have someone help you when you read instructions, pamphlets, or other written materials from your doctor or pharmacy?: 1 - Never  Diabetes:  Is the patient diabetic?  Yes  If diabetic, was a CBG obtained today?  No  Did the patient bring in their glucometer from home?  No phone visit How often do you monitor your CBG's? occasionally  Financial Strains and Diabetes Management:  Are you having any financial strains with the device, your supplies or your medication? No .  Does the patient want to be seen by Chronic Care Management for management of their diabetes?  No  Would the patient like to be referred to a Nutritionist or for Diabetic Management?  No   Diabetic Exams:  Diabetic Eye Exam: Completed 2 weeks ago per patient. Awaiting report from Dr. Eulas Post.  Diabetic Foot Exam: Completed 07/11/2020.     Interpreter Needed?: No  Information entered by :: Caroleen Hamman LPN   Activities of Daily Living In your present state of health, do you have any difficulty performing the following activities: 12/19/2020  Hearing? N  Vision? N  Difficulty concentrating or making decisions? N  Walking or climbing stairs? N  Dressing or bathing? N  Doing errands, shopping? N  Preparing Food and eating ? N  Using the Toilet? N  In the past six months, have you accidently leaked urine? N  Do you have problems with loss of bowel control? N  Managing your Medications? N  Managing your Finances? N  Housekeeping or managing your Housekeeping? N  Some recent data might be hidden    Patient Care Team: Carollee Herter, Alferd Apa, DO as PCP - General (Family Medicine) Calvert Cantor, MD as Consulting Physician  (Ophthalmology) Thea Silversmith, MD as Referring Physician (Radiation Oncology) Surgery, [facility] (General Surgery)  Indicate any recent Medical Services you may have received from other than [facility] providers in the past year (date may be approximate).     Assessment:   This is a  routine wellness examination for Anam.  Hearing/Vision screen  Hearing Screening   125Hz 250Hz 500Hz 1000Hz 2000Hz 3000Hz 4000Hz 6000Hz 8000Hz  Right ear:           Left ear:           Comments: No issues  Vision Screening Comments: Wears glasses Last eye exam-11/2020-Dr. Eulas Post  Dietary issues and exercise activities discussed: Current Exercise Habits: Home exercise routine, Type of exercise: walking, Time (Minutes): 30, Frequency (Times/Week): 4, Weekly Exercise (Minutes/Week): 120, Intensity: Mild, Exercise limited by: None identified  Goals    . Maintain health and independence    . Patient Stated     Increase activity with more walking      Depression Screen PHQ 2/9 Scores 12/19/2020 11/24/2019 05/19/2018 11/16/2016 11/25/2015 11/25/2015 02/25/2015  PHQ - 2 Score 0 0 0 0 0 0 0    Fall Risk Fall Risk  12/19/2020 11/24/2019 09/25/2019 09/19/2018 06/03/2018  Falls in the past year? 0 0 0 0 No  Comment - - [facility] Telephone Survey: data to providers prior to load [employer] Telephone Survey: data to providers prior to load [facility] Telephone Survey: data to providers prior to load  Number falls in past yr: 0 0 - - -  Injury with Fall? 0 0 - - -  Follow up Falls prevention discussed Education provided;Falls prevention discussed - - -    FALL RISK PREVENTION PERTAINING TO THE HOME:  Any stairs in or around the home? Yes  If so, are there any without handrails? No  Home free of loose throw rugs in walkways, pet beds, electrical cords, etc? Yes  Adequate lighting in your home to reduce risk of falls? Yes   ASSISTIVE DEVICES UTILIZED TO PREVENT FALLS:  Life alert? No  Use of a cane, walker or w/c? No  Grab  bars in the bathroom? Yes  Shower chair or bench in shower? No  Elevated toilet seat or a handicapped toilet? No   TIMED UP AND GO:  Was the test performed? No . Phone visit   Cognitive Function:Normal cognitive status assessed by  this Nurse Health Advisor. No abnormalities found.          Immunizations Immunization History  Administered Date(s) Administered  . Influenza, High Dose Seasonal PF 08/16/2014, 08/13/2015, 08/20/2016, 08/16/2018, 07/17/2019  . Influenza-Unspecified 08/13/2015, 08/01/2020  . PFIZER(Purple Top)SARS-COV-2 Vaccination 11/21/2019, 12/11/2019, 08/02/2020  . Pneumococcal Conjugate-13 09/03/2014  . Pneumococcal Polysaccharide-23 12/03/2010, 11/16/2016  . Tdap 05/01/2013  . Zoster Recombinat (Shingrix) 06/04/2018, 10/01/2018    TDAP status: Up to date  Flu Vaccine status: Up to date  Pneumococcal vaccine status: Up to date  Covid-19 vaccine status: Completed vaccines  Qualifies for Shingles Vaccine? No   Zostavax completed No   Shingrix Completed?: Yes  Screening Tests Health Maintenance  Topic Date Due  . OPHTHALMOLOGY EXAM  06/05/2020  . COVID-19 Vaccine (4 - Booster for Pfizer series) 01/31/2021  . MAMMOGRAM  02/07/2021  . HEMOGLOBIN A1C  04/10/2021  . FOOT EXAM  07/11/2021  . URINE MICROALBUMIN  10/10/2021  . TETANUS/TDAP  05/02/2023  . INFLUENZA VACCINE  Completed  . DEXA SCAN  Completed  . Hepatitis C Screening  Completed  . PNA vac Low Risk Adult  Completed    Health Maintenance  Health Maintenance Due  Topic Date Due  . OPHTHALMOLOGY EXAM  06/05/2020    Colorectal cancer screening: No longer required.   Mammogram status: scheduled for 03/18/2021  Bone Density status: Due-Declined  Lung Cancer  Screening: (Low Dose CT Chest recommended if Age 5-80 years, 30 pack-year currently smoking OR have quit w/in 15years.) does not qualify.    Additional Screening:  Hepatitis C Screening:Completed 11/25/2015  Vision Screening:  Recommended annual ophthalmology exams for early detection of glaucoma and other disorders of the eye. Is the patient up to date with their annual eye exam?  Yes  Who is the provider or what is the name of the office in which the patient attends annual eye exams? Dr. Eulas Post   Dental Screening: Recommended annual dental exams for proper oral hygiene  Community Resource Referral / Chronic Care Management: CRR required this visit?  No   CCM required this visit?  No      Plan:     I have personally reviewed and noted the following in the patient's chart:   . Medical and social history . Use of alcohol, tobacco or illicit drugs  . Current medications and supplements . Functional ability and status . Nutritional status . Physical activity . Advanced directives . List of other physicians . Hospitalizations, surgeries, and ER visits in previous 12 months . Vitals . Screenings to include cognitive, depression, and falls . Referrals and appointments  In addition, I have reviewed and discussed with patient certain preventive protocols, quality metrics, and best practice recommendations. A written personalized care plan for preventive services as well as general preventive health recommendations were provided to patient.   Due to this being a telephonic visit, the after visit summary with patients personalized plan was offered to patient via mail or my-chart. Patient would like to access on my-chart.   Marta Antu, LPN   [id number]  Nurse Health Advisor  Nurse Notes: None

## 2021-01-09 ENCOUNTER — Ambulatory Visit: Payer: Medicare PPO | Admitting: Family Medicine

## 2021-01-15 ENCOUNTER — Other Ambulatory Visit: Payer: Self-pay | Admitting: Family Medicine

## 2021-01-15 DIAGNOSIS — E785 Hyperlipidemia, unspecified: Secondary | ICD-10-CM

## 2021-01-15 DIAGNOSIS — C159 Malignant neoplasm of esophagus, unspecified: Secondary | ICD-10-CM

## 2021-01-15 DIAGNOSIS — K219 Gastro-esophageal reflux disease without esophagitis: Secondary | ICD-10-CM

## 2021-01-17 ENCOUNTER — Ambulatory Visit: Payer: Medicare PPO | Admitting: Family Medicine

## 2021-03-18 ENCOUNTER — Ambulatory Visit
Admission: RE | Admit: 2021-03-18 | Discharge: 2021-03-18 | Disposition: A | Discharge status: Home / Self Care | Payer: Medicare PPO | Source: Ambulatory Visit | Attending: General Surgery | Admitting: General Surgery

## 2021-03-18 ENCOUNTER — Other Ambulatory Visit: Payer: Self-pay

## 2021-03-18 ENCOUNTER — Other Ambulatory Visit: Payer: Self-pay | Admitting: General Surgery

## 2021-03-18 DIAGNOSIS — N6489 Other specified disorders of breast: Secondary | ICD-10-CM | POA: Diagnosis not present

## 2021-03-18 DIAGNOSIS — Z853 Personal history of malignant neoplasm of breast: Secondary | ICD-10-CM

## 2021-03-18 DIAGNOSIS — R922 Inconclusive mammogram: Secondary | ICD-10-CM | POA: Diagnosis not present

## 2021-03-20 ENCOUNTER — Other Ambulatory Visit: Payer: Self-pay | Admitting: Family Medicine

## 2021-03-20 DIAGNOSIS — K219 Gastro-esophageal reflux disease without esophagitis: Secondary | ICD-10-CM

## 2021-03-20 DIAGNOSIS — C159 Malignant neoplasm of esophagus, unspecified: Secondary | ICD-10-CM

## 2021-03-20 DIAGNOSIS — E785 Hyperlipidemia, unspecified: Secondary | ICD-10-CM

## 2021-03-25 ENCOUNTER — Other Ambulatory Visit: Payer: Self-pay

## 2021-03-25 ENCOUNTER — Emergency Department (HOSPITAL_BASED_OUTPATIENT_CLINIC_OR_DEPARTMENT_OTHER)
Admission: EM | Admit: 2021-03-25 | Discharge: 2021-03-25 | Disposition: A | Discharge status: Home / Self Care | Payer: Medicare PPO | Attending: Emergency Medicine | Admitting: Emergency Medicine

## 2021-03-25 ENCOUNTER — Encounter (HOSPITAL_BASED_OUTPATIENT_CLINIC_OR_DEPARTMENT_OTHER): Payer: Self-pay | Admitting: Emergency Medicine

## 2021-03-25 DIAGNOSIS — Z8501 Personal history of malignant neoplasm of esophagus: Secondary | ICD-10-CM | POA: Insufficient documentation

## 2021-03-25 DIAGNOSIS — R2241 Localized swelling, mass and lump, right lower limb: Secondary | ICD-10-CM | POA: Diagnosis not present

## 2021-03-25 DIAGNOSIS — Z87891 Personal history of nicotine dependence: Secondary | ICD-10-CM | POA: Insufficient documentation

## 2021-03-25 DIAGNOSIS — Z79899 Other long term (current) drug therapy: Secondary | ICD-10-CM | POA: Diagnosis not present

## 2021-03-25 DIAGNOSIS — M25471 Effusion, right ankle: Secondary | ICD-10-CM

## 2021-03-25 DIAGNOSIS — I10 Essential (primary) hypertension: Secondary | ICD-10-CM | POA: Insufficient documentation

## 2021-03-25 DIAGNOSIS — Z853 Personal history of malignant neoplasm of breast: Secondary | ICD-10-CM | POA: Diagnosis not present

## 2021-03-25 DIAGNOSIS — E119 Type 2 diabetes mellitus without complications: Secondary | ICD-10-CM | POA: Insufficient documentation

## 2021-03-25 MED ORDER — PREDNISONE 20 MG PO TABS: 40.0000 mg | ORAL_TABLET | Freq: Every day | ORAL | 0 refills | Status: AC

## 2021-03-25 NOTE — ED Provider Notes (Signed)
[facility] EMERGENCY DEPARTMENT Provider Note   CSN: [id number] Arrival date & time: 03/25/21  1045     History Chief Complaint  Patient presents with  . Foot Pain    Briana Craig is a 75 y.o. female with PMH of HTN, HLD, and diet-controlled diabetes who presents the ED with complaints of atraumatic right ankle pain and swelling.  Patient does have a history of cancer, but in remission 12 years ago.  On my examination, patient reports that 3 days ago she developed atraumatic discomfort involving her ankle.  She was walking her dog over the weekend per usual, but denies any increased activity or other changes.  No obvious precipitating trauma or injury.  She did not think much of it, but last night she noticed mild swelling involving the ankle.  This morning she called the office of her primary care provider who is not available until Friday so she came to the ED for evaluation.  She feels rather silly because she is ambulatory and was even able to drive here independently, but wanted to be assessed.  She has not yet taken thing for her symptoms.  She denies any history of clots or clotting disorder, recent surgeries or immobilizations, hormone replacement, IVDA, fevers, redness, warmth, numbness or weakness, or history of similar.    HPI     Past Medical History:  Diagnosis Date  . Breast cancer ([place]) 12/2018   right DCIS  . Dysphagia   . Esophageal cancer ([place])   . GERD (gastroesophageal reflux disease)   . Hyperlipemia   . Hypertension    no meds now  . Impaired fasting blood sugar   . Personal history of radiation therapy   . PONV (postoperative nausea and vomiting)   . Pre-diabetes     Patient Active Problem List   Diagnosis Date Noted  . Periorbital cellulitis of right eye 09/11/2019  . Diet-controlled diabetes mellitus ([place]) 05/22/2019  . Ductal carcinoma in situ (DCIS) of right breast 05/22/2019  . Nipple lesion 11/21/2018  . Controlled type 2  diabetes mellitus without complication, without long-term current use of insulin ([place]) 05/19/2018  . Hyperlipidemia associated with type 2 diabetes mellitus ([place]) 05/19/2018  . Vitamin D deficiency 05/19/2018  . History of esophageal cancer 05/19/2018  . Hematuria 04/08/2015  . UTI (urinary tract infection) 04/04/2015  . DM (diabetes mellitus) type II uncontrolled, periph vascular disorder ([place]) 12/06/2014  . Dysuria 01/25/2014  . IFG (impaired fasting glucose) 01/25/2014  . Unspecified vitamin D deficiency 05/30/2013  . Encounter for therapeutic drug monitoring 05/30/2013  . Impaired fasting glucose 05/30/2013  . Other malaise and fatigue 05/30/2013  . Hyperlipidemia 05/30/2013  . GERD (gastroesophageal reflux disease) 05/30/2013  . Need for prophylactic vaccination with combined diphtheria-tetanus-pertussis (DTP) vaccine 05/30/2013  . Esophageal cancer ([place])   . Dysphagia 11/17/2011  . Cancer of esophagus ([place]) 11/13/2011    Past Surgical History:  Procedure Laterality Date  . BREAST BIOPSY Right 12/23/2018  . BREAST LUMPECTOMY Right 01/18/2019  . BREAST LUMPECTOMY WITH RADIOACTIVE SEED LOCALIZATION Right 01/18/2019   Procedure: RIGHT BREAST CENTRAL LUMPECTOMY WITH RADIOACTIVE SEED LOCALIZATION;  Surgeon: Rolm Bookbinder, MD;  Location: [facility];  Service: General;  Laterality: Right;  . DIRECT LARYNGOSCOPY WITH BOTOX INJECTION     tx paralysed vocal cord  . ESOPHAGOGASTRODUODENOSCOPY  11/17/2011   Procedure: ESOPHAGOGASTRODUODENOSCOPY (EGD);  Surgeon: Owens Loffler, MD;  Location: [facility];  Service: Endoscopy;  Laterality: N/A;  . ESOPHAGUS SURGERY    .  EYE SURGERY     both cataracts  . GANGLION CYST EXCISION Left 01/26/2013   Procedure: LEFT FLEXOR CARPI RADIALIS  RELEASE AND SCAPHO TRAPEZIAL TRAPEZOID DEBRIDEMENT ;  Surgeon: Tennis Must, MD;  Location: [facility];  Service: Orthopedics;  Laterality: Left;     OB History   No obstetric  history on file.     Family History  Problem Relation Age of Onset  . Alcohol abuse Sister   . AAA (abdominal aortic aneurysm) Mother   . COPD Father   . Colon cancer Neg Hx     Social History   Tobacco Use  . Smoking status: Former Smoker    Types: Cigarettes    Quit date: 08/30/1987    Years since quitting: 33.5  . Smokeless tobacco: Never Used  Substance Use Topics  . Alcohol use: No  . Drug use: Never    Home Medications Prior to Admission medications   Medication Sig Start Date End Date Taking? Authorizing Provider  predniSONE (DELTASONE) 20 MG tablet Take 2 tablets (40 mg total) by mouth daily with breakfast for 5 days. 03/25/21 03/30/21 Yes Corena Herter, PA-C  Accu-Chek Softclix Lancets lancets Use to check blood sugar once a day.  DX  E11.9 12/20/19   Ann Held, DO  Alcohol Swabs (ALCOHOL WIPES) 70 % PADS To use to check sugars 03/13/20   Carollee Herter, Alferd Apa, DO  Blood Glucose Calibration (ACCU-CHEK AVIVA) SOLN Use to check control when opening a new bottle of strips.  DX E11.9 12/20/19   Ann Held, DO  Blood Glucose Monitoring Suppl (ACCU-CHEK AVIVA PLUS) w/Device KIT Use to check blood sugar once a day.  DX E11.9 12/20/19   Roma Schanz R, DO  Blood Glucose Monitoring Suppl (TRUE METRIX AIR GLUCOSE METER) DEVI Use as directed to check glucose twice daily. E11.9 03/08/20   Roma Schanz R, DO  CALCIUM-VITAMIN D PO Take 600 mg by mouth daily.    [provider]  cetirizine (ZYRTEC) 10 MG tablet Take 10 mg by mouth as needed for allergies.    [provider]  COVID-19 mRNA vaccine, Pfizer, 30 MCG/0.3ML injection AS DIRECTED 08/02/20 08/02/21  Carlyle Basques, MD  famotidine (PEPCID) 40 MG tablet Take 1 tablet (40 mg total) by mouth at bedtime. 03/20/21   Roma Schanz R, DO  fluticasone (FLONASE) 50 MCG/ACT nasal spray Place 2 sprays into the nose daily as needed.     [provider]  glucose blood  (ACCU-CHEK AVIVA PLUS) test strip Use to check blood sugar once daily.  DX  E 11.9 12/20/19   Roma Schanz R, DO  glucose blood (TRUE METRIX BLOOD GLUCOSE TEST) test strip Use as instructed 03/22/20   Carollee Herter, Alferd Apa, DO  latanoprost (XALATAN) 0.005 % ophthalmic solution Place 1 drop into both eyes daily. 10/29/17   [provider]  Multiple Vitamins-Minerals (MULTIVITAMIN PO) Take 1 tablet by mouth daily.    [provider]  Multiple Vitamins-Minerals (PRESERVISION AREDS) CAPS Take by mouth.    [provider]  Omega-3 Fatty Acids (FISH OIL PO) Take 1,200 mg by mouth daily.    [provider]  pantoprazole (PROTONIX) 40 MG tablet Take 1 tablet (40 mg total) by mouth daily. 03/20/21   Ann Held, DO  simvastatin (ZOCOR) 20 MG tablet Take 1 tablet (20 mg total) by mouth daily at 6 PM. 03/20/21   Carollee Herter, Kendrick Fries  R, DO  TRUEplus Lancets 33G MISC Use as directed. Check glucose up to twice daily. E11.9 03/08/20   Roma Schanz R, DO    Allergies    Taxol [paclitaxel], Tramadol, Carboplatin, Codeine, Compazine, Compazine  [prochlorperazine maleate], Penicillins, and Prochlorperazine edisylate  Review of Systems   Review of Systems  All other systems reviewed and are negative.   Physical Exam Updated Vital Signs BP (!) 137/92 (BP Location: Left Arm)   Pulse 93   Temp 97.7 F (36.5 C) (Oral)   Resp 16   Ht _0  (1.575 m)   Wt 53.5 kg   SpO2 97%   BMI 21.58 kg/m   Physical Exam Vitals and nursing note reviewed. Exam conducted with a chaperone present.  Constitutional:      General: She is not in acute distress.    Appearance: Normal appearance. She is not ill-appearing.  HENT:     Head: Normocephalic and atraumatic.  Eyes:     General: No scleral icterus.    Conjunctiva/sclera: Conjunctivae normal.  Cardiovascular:     Rate and Rhythm: Normal rate.     Pulses: Normal pulses.  Pulmonary:     Effort: Pulmonary effort  is normal. No respiratory distress.  Musculoskeletal:        General: Swelling present. No signs of injury. Normal range of motion.     Right lower leg: No edema.     Left lower leg: No edema.     Comments: Right ankle: Very trace swelling relative to contralateral ankle.  No overlying skin changes.  Nontender to palpation.  No warmth.  Dorsiflexion and plantar flexion fully intact with strength intact against resistance. Right foot: Can wiggle toes.  Pedal pulse intact and symmetric contralateral foot.  Sensation intact throughout.  Nontender. Right calf: No appreciable swelling.  Calves are nontender. Right knee and hip: Normal physical exam  Skin:    General: Skin is dry.  Neurological:     Mental Status: She is alert.     GCS: GCS eye subscore is 4. GCS verbal subscore is 5. GCS motor subscore is 6.  Psychiatric:        Mood and Affect: Mood normal.        Behavior: Behavior normal.        Thought Content: Thought content normal.     ED Results / Procedures / Treatments   Labs (all labs ordered are listed, but only abnormal results are displayed) Labs Reviewed - No data to display  EKG None  Radiology No results found.  Procedures Procedures   Medications Ordered in ED Medications - No data to display  ED Course  I have reviewed the triage vital signs and the nursing notes.  Pertinent labs & imaging results that were available during my care of the patient were reviewed by me and considered in my medical decision making (see chart for details).    MDM Rules/Calculators/A&P                          Briana Craig was evaluated in Emergency Department on 03/25/2021 for the symptoms described in the history of present illness. She was evaluated in the context of the global COVID-19 pandemic, which necessitated consideration that the patient might be at risk for infection with the SARS-CoV-2 virus that causes COVID-19. Institutional protocols and algorithms that  pertain to the evaluation of patients at risk for COVID-19 are in a state of rapid  change based on information released by regulatory bodies including the CDC and federal and state organizations. These policies and algorithms were followed during the patient's care in the ED.  I personally reviewed patient's medical chart and all notes from triage and staff during today's encounter. I have also ordered and reviewed all labs and imaging that I felt to be medically necessary in the evaluation of this patient's complaints and with consideration of their physical exam. If needed, translation services were available and utilized.   Patient's history and physical exam is reassuring.  She has very trace swelling in right ankle relative to left ankle.  She does endorse discomfort with ambulation.  We will treat with prednisone x5 days as possible pseudogout.  This would help treat for inflammatory versus autoimmune arthropathy.  Given lack of any large effusion, we will not attempt to perform arthrocentesis for further evaluation.  She has been afebrile and there is no overlying erythema.  She is functionally neurovascularly intact.  Denies history of IVDA.  Strongly doubt septic arthritis or cellulitis at this time.  She also denies any recent travel or surgeries.  She is not on any hormone replacement.  Her exam is not particularly alarming for DVT and I have lower suspicion at this time.  We discussed DVT ultrasound versus more conservative management with prednisone burst and then for evaluation.  She knows that if her symptoms worsen she will likely require DVT study, but given low suspicion at this time we will instead treat for pseudogout.  There is no precipitating trauma and there is no significant bony tenderness.  Doubt fracture or dislocation.  Do not feel as though x-rays will yield any significant findings.  Encouraging her to elevate her leg and take prednisone as directed.  She states that she cannot  take NSAIDs.  Tylenol for additional pain control.  She can follow-up with a primary care provider.  Do not feel as though referral to rheumatology or orthopedics is urgently warranted.  Given unilateral distribution and focally located around ankle, lower suspicion for dependent edema and venous insufficiency is the cause.  However, may wish to consider compression stockings moving forward.  ER return precautions discussed.  Patient voices understanding and is agreeable to the plan.  Final Clinical Impression(s) / ED Diagnoses Final diagnoses:  Right ankle swelling    Rx / DC Orders ED Discharge Orders         Ordered    predniSONE (DELTASONE) 20 MG tablet  Daily with breakfast        03/25/21 1217           Corena Herter, PA-C 03/25/21 1217    Lennice Sites, DO 03/25/21 1306

## 2021-03-25 NOTE — Discharge Instructions (Addendum)
Please read the attachment on calcium pyrophosphate deposition, commonly known as pseudogout.  While the exact cause for your atraumatic right ankle discomfort and swelling is unclear, I have a low suspicion for skin and soft tissue infection, septic joint, deep venous thrombosis, arterial occlusion, fracture/dislocation, or other more emergent causes for your symptoms.  We will treat you with prednisone burst x5 days to cover for inflammatory arthritis such as gout or pseudogout.  There is no large effusion for me to tap for further analysis.  This would also treat for an autoimmune arthropathy.  In the interim, please elevate your affected foot.  Activity modification.  Tylenol as needed.  Follow-up with your primary care provider for ongoing evaluation and management.  Return to the ED or seek immediate medical attention should you experience any new or worsening symptoms.

## 2021-03-25 NOTE — ED Triage Notes (Signed)
Pt arrives pov, ambulatory to triage, c/o R foot pain and swelling with R lower leg pain x 3 days. Pt denies injury, denies recent travel.

## 2021-05-07 ENCOUNTER — Telehealth: Payer: Self-pay | Admitting: Family Medicine

## 2021-05-07 ENCOUNTER — Other Ambulatory Visit: Payer: Self-pay | Admitting: Family Medicine

## 2021-05-07 DIAGNOSIS — E785 Hyperlipidemia, unspecified: Secondary | ICD-10-CM

## 2021-05-07 DIAGNOSIS — E1169 Type 2 diabetes mellitus with other specified complication: Secondary | ICD-10-CM

## 2021-05-07 DIAGNOSIS — E119 Type 2 diabetes mellitus without complications: Secondary | ICD-10-CM

## 2021-05-07 NOTE — Telephone Encounter (Signed)
Patient is schedule for her annual physical on 05/27/21. She would like lab done before appt. Please advise

## 2021-05-07 NOTE — Telephone Encounter (Signed)
Sept appt not coded as CPE only "CPE labs" done that day  LVM for patient to call and sched lab appt a week before the CPE appt

## 2021-05-20 ENCOUNTER — Other Ambulatory Visit: Payer: Self-pay

## 2021-05-20 ENCOUNTER — Other Ambulatory Visit (INDEPENDENT_AMBULATORY_CARE_PROVIDER_SITE_OTHER): Payer: Medicare PPO

## 2021-05-20 DIAGNOSIS — E119 Type 2 diabetes mellitus without complications: Secondary | ICD-10-CM | POA: Diagnosis not present

## 2021-05-20 DIAGNOSIS — E1169 Type 2 diabetes mellitus with other specified complication: Secondary | ICD-10-CM

## 2021-05-20 DIAGNOSIS — E785 Hyperlipidemia, unspecified: Secondary | ICD-10-CM

## 2021-05-20 LAB — COMPREHENSIVE METABOLIC PANEL
ALT: 15 U/L (ref 0–35)
AST: 20 U/L (ref 0–37)
Albumin: 4.1 g/dL (ref 3.5–5.2)
Alkaline Phosphatase: 47 U/L (ref 39–117)
BUN: 18 mg/dL (ref 6–23)
CO2: 28 mEq/L (ref 19–32)
Calcium: 9.5 mg/dL (ref 8.4–10.5)
Chloride: 102 mEq/L (ref 96–112)
Creatinine, Ser: 1.03 mg/dL (ref 0.40–1.20)
GFR: 52.78 mL/min — ABNORMAL LOW (ref >60.00)
Glucose, Bld: 110 mg/dL — ABNORMAL HIGH (ref 70–99)
Potassium: 4 mEq/L (ref 3.5–5.1)
Sodium: 137 mEq/L (ref 135–145)
Total Bilirubin: 0.6 mg/dL (ref 0.2–1.2)
Total Protein: 6.9 g/dL (ref 6.0–8.3)

## 2021-05-20 LAB — CBC WITH DIFFERENTIAL/PLATELET
Basophils Absolute: 0 10*3/uL (ref 0.0–0.1)
Basophils Relative: 0.4 % (ref 0.0–3.0)
Eosinophils Absolute: 0.2 10*3/uL (ref 0.0–0.7)
Eosinophils Relative: 4.2 % (ref 0.0–5.0)
HCT: 37.2 % (ref 36.0–46.0)
Hemoglobin: 12.6 g/dL (ref 12.0–15.0)
Lymphocytes Relative: 30.3 % (ref 12.0–46.0)
Lymphs Abs: 1.6 10*3/uL (ref 0.7–4.0)
MCHC: 33.7 g/dL (ref 30.0–36.0)
MCV: 91.1 fl (ref 78.0–100.0)
Monocytes Absolute: 0.6 10*3/uL (ref 0.1–1.0)
Monocytes Relative: 11.7 % (ref 3.0–12.0)
Neutro Abs: 2.9 10*3/uL (ref 1.4–7.7)
Neutrophils Relative %: 53.4 % (ref 43.0–77.0)
Platelets: 198 10*3/uL (ref 150.0–400.0)
RBC: 4.09 Mil/uL (ref 3.87–5.11)
RDW: 14.8 % (ref 11.5–15.5)
WBC: 5.4 10*3/uL (ref 4.0–10.5)

## 2021-05-20 LAB — LIPID PANEL
Cholesterol: 184 mg/dL (ref 0–200)
HDL: 85 mg/dL (ref >39.00)
LDL Cholesterol: 82 mg/dL (ref 0–99)
NonHDL: 99.2
Total CHOL/HDL Ratio: 2
Triglycerides: 86 mg/dL (ref 0.0–149.0)
VLDL: 17.2 mg/dL (ref 0.0–40.0)

## 2021-05-20 LAB — HEMOGLOBIN A1C: Hgb A1c MFr Bld: 7 % — ABNORMAL HIGH (ref 4.6–6.5)

## 2021-05-25 ENCOUNTER — Other Ambulatory Visit: Payer: Self-pay | Admitting: Family Medicine

## 2021-05-25 DIAGNOSIS — E119 Type 2 diabetes mellitus without complications: Secondary | ICD-10-CM

## 2021-05-26 ENCOUNTER — Other Ambulatory Visit: Payer: Self-pay

## 2021-05-27 ENCOUNTER — Encounter: Payer: Self-pay | Admitting: Family Medicine

## 2021-05-27 ENCOUNTER — Telehealth: Payer: Self-pay | Admitting: Family Medicine

## 2021-05-27 ENCOUNTER — Ambulatory Visit (INDEPENDENT_AMBULATORY_CARE_PROVIDER_SITE_OTHER): Payer: Medicare PPO | Admitting: Family Medicine

## 2021-05-27 VITALS — BP 122/80 | HR 86 | Temp 98.0°F | Resp 18 | Ht 62.0 in | Wt 121.8 lb

## 2021-05-27 DIAGNOSIS — Z0000 Encounter for general adult medical examination without abnormal findings: Secondary | ICD-10-CM | POA: Diagnosis not present

## 2021-05-27 DIAGNOSIS — K219 Gastro-esophageal reflux disease without esophagitis: Secondary | ICD-10-CM | POA: Diagnosis not present

## 2021-05-27 DIAGNOSIS — E119 Type 2 diabetes mellitus without complications: Secondary | ICD-10-CM

## 2021-05-27 DIAGNOSIS — E1169 Type 2 diabetes mellitus with other specified complication: Secondary | ICD-10-CM

## 2021-05-27 DIAGNOSIS — E785 Hyperlipidemia, unspecified: Secondary | ICD-10-CM | POA: Diagnosis not present

## 2021-05-27 DIAGNOSIS — D0511 Intraductal carcinoma in situ of right breast: Secondary | ICD-10-CM

## 2021-05-27 DIAGNOSIS — E2839 Other primary ovarian failure: Secondary | ICD-10-CM | POA: Diagnosis not present

## 2021-05-27 DIAGNOSIS — IMO0002 Reserved for concepts with insufficient information to code with codable children: Secondary | ICD-10-CM

## 2021-05-27 DIAGNOSIS — C159 Malignant neoplasm of esophagus, unspecified: Secondary | ICD-10-CM | POA: Diagnosis not present

## 2021-05-27 DIAGNOSIS — E1151 Type 2 diabetes mellitus with diabetic peripheral angiopathy without gangrene: Secondary | ICD-10-CM | POA: Diagnosis not present

## 2021-05-27 DIAGNOSIS — E1165 Type 2 diabetes mellitus with hyperglycemia: Secondary | ICD-10-CM

## 2021-05-27 NOTE — Assessment & Plan Note (Signed)
ghm utd Check labs  

## 2021-05-27 NOTE — Patient Instructions (Signed)
Preventive Care 77 Years and Older, Female Preventive care refers to lifestyle choices and visits with your health care provider that can promote health and wellness. This includes: A yearly physical exam. This is also called an annual wellness visit. Regular dental and eye exams. Immunizations. Screening for certain conditions. Healthy lifestyle choices, such as: Eating a healthy diet. Getting regular exercise. Not using drugs or products that contain nicotine and tobacco. Limiting alcohol use. What can I expect for my preventive care visit? Physical exam Your health care provider will check your: Height and weight. These may be used to calculate your BMI (body mass index). BMI is a measurement that tells if you are at a healthy weight. Heart rate and blood pressure. Body temperature. Skin for abnormal spots. Counseling Your health care provider may ask you questions about your: Past medical problems. Family's medical history. Alcohol, tobacco, and drug use. Emotional well-being. Home life and relationship well-being. Sexual activity. Diet, exercise, and sleep habits. History of falls. Memory and ability to understand (cognition). Work and work Statistician. Pregnancy and menstrual history. Access to firearms. What immunizations do I need?  Vaccines are usually given at various ages, according to a schedule. Your health care provider will recommend vaccines for you based on your age, medicalhistory, and lifestyle or other factors, such as travel or where you work. What tests do I need? Blood tests Lipid and cholesterol levels. These may be checked every 5 years, or more often depending on your overall health. Hepatitis C test. Hepatitis B test. Screening Lung cancer screening. You may have this screening every year starting at age 44 if you have a 30-pack-year history of smoking and currently smoke or have quit within the past 15 years. Colorectal cancer screening. All  adults should have this screening starting at age 39 and continuing until age 65. Your health care provider may recommend screening at age 61 if you are at increased risk. You will have tests every 1-10 years, depending on your results and the type of screening test. Diabetes screening. This is done by checking your blood sugar (glucose) after you have not eaten for a while (fasting). You may have this done every 1-3 years. Mammogram. This may be done every 1-2 years. Talk with your health care provider about how often you should have regular mammograms. Abdominal aortic aneurysm (AAA) screening. You may need this if you are a current or former smoker. BRCA-related cancer screening. This may be done if you have a family history of breast, ovarian, tubal, or peritoneal cancers. Other tests STD (sexually transmitted disease) testing, if you are at risk. Bone density scan. This is done to screen for osteoporosis. You may have this done starting at age 54. Talk with your health care provider about your test results, treatment options,and if necessary, the need for more tests. Follow these instructions at home: Eating and drinking  Eat a diet that includes fresh fruits and vegetables, whole grains, lean protein, and low-fat dairy products. Limit your intake of foods with high amounts of sugar, saturated fats, and salt. Take vitamin and mineral supplements as recommended by your health care provider. Do not drink alcohol if your health care provider tells you not to drink. If you drink alcohol: Limit how much you have to 0-1 drink a day. Be aware of how much alcohol is in your drink. In the [place] one drink equals one 12 oz bottle of beer (355 mL), one 5 oz glass of wine (148 mL), or one 1  oz glass of hard liquor (44 mL).  Lifestyle Take daily care of your teeth and gums. Brush your teeth every morning and night with fluoride toothpaste. Floss one time each day. Stay active. Exercise for at  least 30 minutes 5 or more days each week. Do not use any products that contain nicotine or tobacco, such as cigarettes, e-cigarettes, and chewing tobacco. If you need help quitting, ask your health care provider. Do not use drugs. If you are sexually active, practice safe sex. Use a condom or other form of protection in order to prevent STIs (sexually transmitted infections). Talk with your health care provider about taking a low-dose aspirin or statin. Find healthy ways to cope with stress, such as: Meditation, yoga, or listening to music. Journaling. Talking to a trusted person. Spending time with friends and family. Safety Always wear your seat belt while driving or riding in a vehicle. Do not drive: If you have been drinking alcohol. Do not ride with someone who has been drinking. When you are tired or distracted. While texting. Wear a helmet and other protective equipment during sports activities. If you have firearms in your house, make sure you follow all gun safety procedures. What's next? Visit your health care provider once a year for an annual wellness visit. Ask your health care provider how often you should have your eyes and teeth checked. Stay up to date on all vaccines. This information is not intended to replace advice given to you by your health care provider. Make sure you discuss any questions you have with your healthcare provider. Document Revised: 10/09/2020 Document Reviewed: 10/13/2018 Elsevier Patient Education  2022 [employer].

## 2021-05-27 NOTE — Assessment & Plan Note (Signed)
Encourage heart healthy diet such as MIND or DASH diet, increase exercise, avoid trans fats, simple carbohydrates and processed foods, consider a krill or fish or flaxseed oil cap daily.  °

## 2021-05-27 NOTE — Assessment & Plan Note (Signed)
Per oncology °

## 2021-05-27 NOTE — Assessment & Plan Note (Signed)
Stable

## 2021-05-27 NOTE — Progress Notes (Signed)
Subjective:   By signing my name below, I, Shehryar Baig, attest that this documentation has been prepared under the direction and in the presence of Dr. Roma Schanz, DO. 05/27/2021    Patient ID: Briana Craig, female    DOB: [birth year], 75 y.o.   MRN: [id number]  Chief Complaint  Patient presents with   Annual Exam    HPI Patient is in today for a comprehensive physical exam. She reports that she occasionally gets acute pain in her right lower back/hip area when she sits down for the past couple of months. She manages her pain with tylenol and finds it effective. It has been increasing in frequency since it first started. She has not gotten a dexa scan recently and is due for one.  Her blood sugars were elevated during her last lab results. She does not regularly check her blood sugars. She actively working on her diet to manage her blood sugar levels.   Lab Results  Component Value Date   HGBA1C 7.0 (H) 05/20/2021   She denies having any fever, ear pain, congestion, sinus pain, sore throat, eye pain, chest pain, palpations, cough, SOB, wheezing, n/v/d, constipation, blood in stool, dysuria, frequency, hematuria, or headaches at this time. She has 4 Covid-19 vaccines at this time.  She is UTD on vision car and sees Dr. Eulas Post at [facility].   Past Medical History:  Diagnosis Date   Breast cancer ([place]) 12/2018   right DCIS   Dysphagia    Esophageal cancer ([facility])    GERD (gastroesophageal reflux disease)    Hyperlipemia    Hypertension    no meds now   Impaired fasting blood sugar    Personal history of radiation therapy    PONV (postoperative nausea and vomiting)    Pre-diabetes     Past Surgical History:  Procedure Laterality Date   BREAST BIOPSY Right 12/23/2018   BREAST LUMPECTOMY Right 01/18/2019   BREAST LUMPECTOMY WITH RADIOACTIVE SEED LOCALIZATION Right 01/18/2019   Procedure: RIGHT BREAST CENTRAL LUMPECTOMY WITH RADIOACTIVE SEED  LOCALIZATION;  Surgeon: Rolm Bookbinder, MD;  Location: [place];  Service: General;  Laterality: Right;   DIRECT LARYNGOSCOPY WITH BOTOX INJECTION     tx paralysed vocal cord   ESOPHAGOGASTRODUODENOSCOPY  11/17/2011   Procedure: ESOPHAGOGASTRODUODENOSCOPY (EGD);  Surgeon: Owens Loffler, MD;  Location: [facility];  Service: Endoscopy;  Laterality: N/A;   ESOPHAGUS SURGERY     EYE SURGERY     both cataracts   GANGLION CYST EXCISION Left 01/26/2013   Procedure: LEFT FLEXOR CARPI RADIALIS  RELEASE AND SCAPHO TRAPEZIAL TRAPEZOID DEBRIDEMENT ;  Surgeon: Tennis Must, MD;  Location: [place];  Service: Orthopedics;  Laterality: Left;    Family History  Problem Relation Age of Onset   Alcohol abuse Sister    AAA (abdominal aortic aneurysm) Mother    COPD Father    Colon cancer Neg Hx     Social History   Socioeconomic History   Marital status: Married    Spouse name: Sonia Side   Number of children: 0   Years of education: 18   Highest education level: Not on file  Occupational History   Occupation: RETIRED     Employer: OTHER  Tobacco Use   Smoking status: Former    Types: Cigarettes    Quit date: 08/30/1987    Years since quitting: 33.7   Smokeless tobacco: Never  Substance and Sexual Activity   Alcohol use: No  Drug use: Never   Sexual activity: Yes    Partners: Male    Birth control/protection: Post-menopausal  Other Topics Concern   Not on file  Social History Narrative   Marital Status:  Married Sonia Side)   Children:  None    Pets: Dog (1)    Living Situation: Lives with husband.   Occupation:  Retired Visual merchandiser)    Education: Master's Degree    Tobacco Use/Exposure:  Formal Smoker    Alcohol Use:  Occasional   Drug Use:  None   Diet:  Regular   Exercise:  Walking 1 mile 5 times per week.    Hobbies:  Reading                Social Determinants of Health   Financial Resource Strain: Low Risk    Difficulty of Paying Living  Expenses: Not hard at all  Food Insecurity: No Food Insecurity   Worried About Charity fundraiser in the Last Year: Never true   Arboriculturist in the Last Year: Never true  Transportation Needs: No Transportation Needs   Lack of Transportation (Medical): No   Lack of Transportation (Non-Medical): No  Physical Activity: Insufficiently Active   Days of Exercise per Week: 4 days   Minutes of Exercise per Session: 30 min  Stress: No Stress Concern Present   Feeling of Stress : Not at all  Social Connections: Moderately Integrated   Frequency of Communication with Friends and Family: More than three times a week   Frequency of Social Gatherings with Friends and Family: Once a week   Attends Religious Services: 1 to 4 times per year   Active Member of [employer] or Organizations: No   Attends Archivist Meetings: Never   Marital Status: Married  Human resources officer Violence: Not At Risk   Fear of Current or Ex-Partner: No   Emotionally Abused: No   Physically Abused: No   Sexually Abused: No    Outpatient Medications Prior to Visit  Medication Sig Dispense Refill   Accu-Chek Softclix Lancets lancets Use to check blood sugar once a day.  DX  E11.9 100 each 1   Alcohol Swabs (ALCOHOL WIPES) 70 % PADS To use to check sugars 100 each 1   Blood Glucose Calibration (ACCU-CHEK AVIVA) SOLN Use to check control when opening a new bottle of strips.  DX E11.9 1 each 1   Blood Glucose Monitoring Suppl (ACCU-CHEK AVIVA PLUS) w/Device KIT Use to check blood sugar once a day.  DX E11.9 1 kit 0   Blood Glucose Monitoring Suppl (TRUE METRIX AIR GLUCOSE METER) DEVI Use as directed to check glucose twice daily. E11.9 1 each 0   CALCIUM-VITAMIN D PO Take 600 mg by mouth daily.     cetirizine (ZYRTEC) 10 MG tablet Take 10 mg by mouth as needed for allergies.     COVID-19 mRNA vaccine, Pfizer, 30 MCG/0.3ML injection AS DIRECTED .3 mL 0   famotidine (PEPCID) 40 MG tablet Take 1 tablet (40 mg total) by  mouth at bedtime. 90 tablet 1   fluticasone (FLONASE) 50 MCG/ACT nasal spray Place 2 sprays into the nose daily as needed.      glucose blood (ACCU-CHEK AVIVA PLUS) test strip Use to check blood sugar once daily.  DX  E 11.9 100 strip 1   glucose blood (TRUE METRIX BLOOD GLUCOSE TEST) test strip Use as instructed 100 each 12   latanoprost (XALATAN) 0.005 % ophthalmic solution Place 1  drop into both eyes daily.     Multiple Vitamins-Minerals (MULTIVITAMIN PO) Take 1 tablet by mouth daily.     Multiple Vitamins-Minerals (PRESERVISION AREDS) CAPS Take by mouth.     Omega-3 Fatty Acids (FISH OIL PO) Take 1,200 mg by mouth daily.     pantoprazole (PROTONIX) 40 MG tablet Take 1 tablet (40 mg total) by mouth daily. 90 tablet 1   simvastatin (ZOCOR) 20 MG tablet Take 1 tablet (20 mg total) by mouth daily at 6 PM. 90 tablet 1   TRUEplus Lancets 33G MISC Use as directed. Check glucose up to twice daily. E11.9 100 each 11   No facility-administered medications prior to visit.    Allergies  Allergen Reactions   Taxol [Paclitaxel] Hives and Itching   Tramadol     "Sweaty, shaky and faint"   Carboplatin Hives and Itching   Codeine    Compazine    Compazine  [Prochlorperazine Maleate] Other (See Comments)    Severe chest pain   Penicillins    Prochlorperazine Edisylate     Other reaction(s): Other (See Comments) Severe chest pain    Review of Systems  Constitutional:  Negative for fever.  HENT:  Negative for congestion, ear pain, sinus pain and sore throat.   Eyes:  Negative for pain.  Respiratory:  Negative for cough, shortness of breath and wheezing.   Cardiovascular:  Negative for chest pain and palpitations.  Gastrointestinal:  Negative for blood in stool, constipation, diarrhea, nausea and vomiting.  Genitourinary:  Negative for dysuria, frequency and hematuria.  Musculoskeletal:  Positive for joint pain (Right).  Neurological:  Negative for headaches.  Psychiatric/Behavioral:   Negative for depression. The patient is not nervous/anxious.       Objective:    Physical Exam Constitutional:      General: She is not in acute distress.    Appearance: Normal appearance. She is not ill-appearing.  HENT:     Head: Normocephalic and atraumatic.     Right Ear: Tympanic membrane, ear canal and external ear normal.     Left Ear: Tympanic membrane, ear canal and external ear normal.  Eyes:     Extraocular Movements: Extraocular movements intact.     Pupils: Pupils are equal, round, and reactive to light.  Cardiovascular:     Rate and Rhythm: Normal rate and regular rhythm.     Pulses: Normal pulses.     Heart sounds: Normal heart sounds. No murmur heard.   No gallop.  Pulmonary:     Effort: Pulmonary effort is normal. No respiratory distress.     Breath sounds: Normal breath sounds. No wheezing, rhonchi or rales.  Abdominal:     General: Bowel sounds are normal. There is no distension.     Palpations: Abdomen is soft. There is no mass.     Tenderness: There is no abdominal tenderness. There is no guarding or rebound.     Hernia: No hernia is present.  Musculoskeletal:     Comments: 5/5 strength in lower extremities  Mild pain in right hip during external rotation.  Skin:    General: Skin is warm and dry.  Neurological:     Mental Status: She is alert and oriented to person, place, and time.     Deep Tendon Reflexes:     Reflex Scores:      Patellar reflexes are 2+ on the right side and 2+ on the left side. Psychiatric:        Behavior: Behavior normal.  BP 122/80 (BP Location: Right Arm, Patient Position: Sitting, Cuff Size: Normal)   Pulse 86   Temp 98 F (36.7 C) (Oral)   Resp 18   Ht 5' 2" (1.575 m)   Wt 121 lb 12.8 oz (55.2 kg)   SpO2 98%   BMI 22.28 kg/m  Wt Readings from Last 3 Encounters:  05/27/21 121 lb 12.8 oz (55.2 kg)  03/25/21 118 lb (53.5 kg)  12/19/20 120 lb (54.4 kg)    Diabetic Foot Exam - Simple   No data filed    Lab  Results  Component Value Date   WBC 5.4 05/20/2021   HGB 12.6 05/20/2021   HCT 37.2 05/20/2021   PLT 198.0 05/20/2021   GLUCOSE 110 (H) 05/20/2021   CHOL 184 05/20/2021   TRIG 86.0 05/20/2021   HDL 85.00 05/20/2021   LDLCALC 82 05/20/2021   ALT 15 05/20/2021   AST 20 05/20/2021   NA 137 05/20/2021   K 4.0 05/20/2021   CL 102 05/20/2021   CREATININE 1.03 05/20/2021   BUN 18 05/20/2021   CO2 28 05/20/2021   TSH 2.147 04/24/2013   HGBA1C 7.0 (H) 05/20/2021   MICROALBUR 1.2 10/10/2020    Lab Results  Component Value Date   TSH 2.147 04/24/2013   Lab Results  Component Value Date   WBC 5.4 05/20/2021   HGB 12.6 05/20/2021   HCT 37.2 05/20/2021   MCV 91.1 05/20/2021   PLT 198.0 05/20/2021   Lab Results  Component Value Date   NA 137 05/20/2021   K 4.0 05/20/2021   CO2 28 05/20/2021   GLUCOSE 110 (H) 05/20/2021   BUN 18 05/20/2021   CREATININE 1.03 05/20/2021   BILITOT 0.6 05/20/2021   ALKPHOS 47 05/20/2021   AST 20 05/20/2021   ALT 15 05/20/2021   PROT 6.9 05/20/2021   ALBUMIN 4.1 05/20/2021   CALCIUM 9.5 05/20/2021   GFR 52.78 (L) 05/20/2021   Lab Results  Component Value Date   CHOL 184 05/20/2021   Lab Results  Component Value Date   HDL 85.00 05/20/2021   Lab Results  Component Value Date   LDLCALC 82 05/20/2021   Lab Results  Component Value Date   TRIG 86.0 05/20/2021   Lab Results  Component Value Date   CHOLHDL 2 05/20/2021   Lab Results  Component Value Date   HGBA1C 7.0 (H) 05/20/2021   Mammogram- Last completed 03/18/2021. Results showed probable benign asymmetry in posterior central left breast in craniocaudal projection which was not present in previous examinations and most likely represents normal fibroglandular tissues seen at [place], otherwise no evidence of malignancy elsewhere in either breast. Repeat left 3D diagnostic mammogram in 6 months. She has scheduled an upcomming mammogram on 09/19/2021.  Dexa- Last completed 05/28/2016.  Results showed she is osteopenic. Repeat in 2 years. Due.  Colonoscopy- Last completed 01/05/2011.      Assessment & Plan:   Problem List Items Addressed This Visit       Unprioritized   Esophageal cancer ([place])   DM (diabetes mellitus) type II uncontrolled, periph vascular disorder ([place])   Diet-controlled diabetes mellitus ([place])    con't diet  Lab Results  Component Value Date   HGBA1C 7.0 (H) 05/20/2021         Ductal carcinoma in situ (DCIS) of right breast    Per oncology       GERD (gastroesophageal reflux disease)    Stable  Hyperlipidemia associated with type 2 diabetes mellitus ([place])    Encourage heart healthy diet such as MIND or DASH diet, increase exercise, avoid trans fats, simple carbohydrates and processed foods, consider a krill or fish or flaxseed oil cap daily.        Preventative health care    ghm utd Check labs        Other Visit Diagnoses     Estrogen deficiency    -  Primary   Relevant Orders   DG Bone Density        No orders of the defined types were placed in this encounter.   I, Dr. Roma Schanz, DO, personally preformed the services described in this documentation.  All medical record entries made by the scribe were at my direction and in my presence.  I have reviewed the chart and discharge instructions (if applicable) and agree that the record reflects my personal performance and is accurate and complete. 05/27/2021   I,Shehryar Baig,acting as a Education administrator for [employer], DO.,have documented all relevant documentation on the behalf of Ann Held, DO,as directed by  Ann Held, DO while in the presence of Ann Held, DO.   Ann Held, DO

## 2021-05-27 NOTE — Telephone Encounter (Signed)
Patient would like lab appt a week before her January follow up appt. Please advise

## 2021-05-27 NOTE — Assessment & Plan Note (Signed)
con't diet  Lab Results  Component Value Date   HGBA1C 7.0 (H) 05/20/2021

## 2021-05-27 NOTE — Telephone Encounter (Signed)
Are you okay with this?

## 2021-05-28 ENCOUNTER — Other Ambulatory Visit: Payer: Self-pay | Admitting: Family Medicine

## 2021-05-28 DIAGNOSIS — E119 Type 2 diabetes mellitus without complications: Secondary | ICD-10-CM

## 2021-05-28 DIAGNOSIS — E785 Hyperlipidemia, unspecified: Secondary | ICD-10-CM

## 2021-05-28 DIAGNOSIS — E1169 Type 2 diabetes mellitus with other specified complication: Secondary | ICD-10-CM

## 2021-06-12 DIAGNOSIS — H401131 Primary open-angle glaucoma, bilateral, mild stage: Secondary | ICD-10-CM | POA: Diagnosis not present

## 2021-06-12 DIAGNOSIS — H353132 Nonexudative age-related macular degeneration, bilateral, intermediate dry stage: Secondary | ICD-10-CM | POA: Diagnosis not present

## 2021-06-12 DIAGNOSIS — H26493 Other secondary cataract, bilateral: Secondary | ICD-10-CM | POA: Diagnosis not present

## 2021-07-03 ENCOUNTER — Other Ambulatory Visit: Payer: Medicare PPO

## 2021-07-17 ENCOUNTER — Ambulatory Visit (HOSPITAL_BASED_OUTPATIENT_CLINIC_OR_DEPARTMENT_OTHER)
Admission: RE | Admit: 2021-07-17 | Discharge: 2021-07-17 | Disposition: A | Discharge status: Home / Self Care | Payer: Medicare PPO | Source: Ambulatory Visit | Attending: Family Medicine | Admitting: Family Medicine

## 2021-07-17 ENCOUNTER — Other Ambulatory Visit: Payer: Self-pay

## 2021-07-17 DIAGNOSIS — M810 Age-related osteoporosis without current pathological fracture: Secondary | ICD-10-CM | POA: Diagnosis not present

## 2021-07-17 DIAGNOSIS — E2839 Other primary ovarian failure: Secondary | ICD-10-CM | POA: Diagnosis not present

## 2021-07-17 DIAGNOSIS — M8588 Other specified disorders of bone density and structure, other site: Secondary | ICD-10-CM | POA: Diagnosis not present

## 2021-07-23 NOTE — Progress Notes (Signed)
Patient stated that she will discuss medications at next visit.  She wants to discuss side effects.

## 2021-10-16 DIAGNOSIS — H353211 Exudative age-related macular degeneration, right eye, with active choroidal neovascularization: Secondary | ICD-10-CM | POA: Diagnosis not present

## 2021-10-16 DIAGNOSIS — H353122 Nonexudative age-related macular degeneration, left eye, intermediate dry stage: Secondary | ICD-10-CM | POA: Diagnosis not present

## 2021-10-20 DIAGNOSIS — H3561 Retinal hemorrhage, right eye: Secondary | ICD-10-CM | POA: Diagnosis not present

## 2021-10-20 DIAGNOSIS — H353122 Nonexudative age-related macular degeneration, left eye, intermediate dry stage: Secondary | ICD-10-CM | POA: Diagnosis not present

## 2021-10-20 DIAGNOSIS — H353211 Exudative age-related macular degeneration, right eye, with active choroidal neovascularization: Secondary | ICD-10-CM | POA: Diagnosis not present

## 2021-10-20 DIAGNOSIS — H35453 Secondary pigmentary degeneration, bilateral: Secondary | ICD-10-CM | POA: Diagnosis not present

## 2021-10-20 DIAGNOSIS — Z961 Presence of intraocular lens: Secondary | ICD-10-CM | POA: Diagnosis not present

## 2021-10-20 DIAGNOSIS — H40003 Preglaucoma, unspecified, bilateral: Secondary | ICD-10-CM | POA: Diagnosis not present

## 2021-10-20 DIAGNOSIS — H35363 Drusen (degenerative) of macula, bilateral: Secondary | ICD-10-CM | POA: Diagnosis not present

## 2021-10-21 DIAGNOSIS — H353211 Exudative age-related macular degeneration, right eye, with active choroidal neovascularization: Secondary | ICD-10-CM | POA: Diagnosis not present

## 2021-11-18 ENCOUNTER — Other Ambulatory Visit: Payer: Self-pay | Admitting: Family Medicine

## 2021-11-18 ENCOUNTER — Telehealth: Payer: Self-pay

## 2021-11-18 DIAGNOSIS — E1169 Type 2 diabetes mellitus with other specified complication: Secondary | ICD-10-CM

## 2021-11-18 DIAGNOSIS — E1165 Type 2 diabetes mellitus with hyperglycemia: Secondary | ICD-10-CM

## 2021-11-18 DIAGNOSIS — E559 Vitamin D deficiency, unspecified: Secondary | ICD-10-CM

## 2021-11-18 NOTE — Telephone Encounter (Signed)
Pt called asking if PCP would please put lab orders in for her so she can get her labs drawn on Friday, 11/21/21 (early in the morning) so she will have the results to discuss with PCP when she comes in for her appt on 11/27/21.  Pt will be seeing PCP for 6 mo f/up on hyperlipidemia, diabetes II.  Please advise.

## 2021-11-20 DIAGNOSIS — H353122 Nonexudative age-related macular degeneration, left eye, intermediate dry stage: Secondary | ICD-10-CM | POA: Diagnosis not present

## 2021-11-20 DIAGNOSIS — H353211 Exudative age-related macular degeneration, right eye, with active choroidal neovascularization: Secondary | ICD-10-CM | POA: Diagnosis not present

## 2021-11-20 DIAGNOSIS — H26493 Other secondary cataract, bilateral: Secondary | ICD-10-CM | POA: Diagnosis not present

## 2021-11-20 DIAGNOSIS — H401131 Primary open-angle glaucoma, bilateral, mild stage: Secondary | ICD-10-CM | POA: Diagnosis not present

## 2021-11-24 DIAGNOSIS — H43812 Vitreous degeneration, left eye: Secondary | ICD-10-CM | POA: Diagnosis not present

## 2021-11-24 DIAGNOSIS — H353122 Nonexudative age-related macular degeneration, left eye, intermediate dry stage: Secondary | ICD-10-CM | POA: Diagnosis not present

## 2021-11-24 DIAGNOSIS — H353211 Exudative age-related macular degeneration, right eye, with active choroidal neovascularization: Secondary | ICD-10-CM | POA: Diagnosis not present

## 2021-11-27 ENCOUNTER — Ambulatory Visit (INDEPENDENT_AMBULATORY_CARE_PROVIDER_SITE_OTHER): Payer: Medicare PPO | Admitting: Family Medicine

## 2021-11-27 ENCOUNTER — Encounter: Payer: Self-pay | Admitting: Family Medicine

## 2021-11-27 VITALS — BP 130/90 | HR 86 | Temp 97.8°F | Resp 18 | Ht 62.0 in | Wt 122.2 lb

## 2021-11-27 DIAGNOSIS — E119 Type 2 diabetes mellitus without complications: Secondary | ICD-10-CM

## 2021-11-27 DIAGNOSIS — E1165 Type 2 diabetes mellitus with hyperglycemia: Secondary | ICD-10-CM | POA: Diagnosis not present

## 2021-11-27 DIAGNOSIS — M810 Age-related osteoporosis without current pathological fracture: Secondary | ICD-10-CM | POA: Diagnosis not present

## 2021-11-27 DIAGNOSIS — E559 Vitamin D deficiency, unspecified: Secondary | ICD-10-CM | POA: Diagnosis not present

## 2021-11-27 DIAGNOSIS — E785 Hyperlipidemia, unspecified: Secondary | ICD-10-CM | POA: Diagnosis not present

## 2021-11-27 DIAGNOSIS — E1169 Type 2 diabetes mellitus with other specified complication: Secondary | ICD-10-CM

## 2021-11-27 LAB — MICROALBUMIN / CREATININE URINE RATIO
Creatinine,U: 30.9 mg/dL
Microalb Creat Ratio: 4.3 mg/g (ref 0.0–30.0)
Microalb, Ur: 1.3 mg/dL (ref 0.0–1.9)

## 2021-11-27 LAB — COMPREHENSIVE METABOLIC PANEL
ALT: 17 U/L (ref 0–35)
AST: 23 U/L (ref 0–37)
Albumin: 4.5 g/dL (ref 3.5–5.2)
Alkaline Phosphatase: 53 U/L (ref 39–117)
BUN: 21 mg/dL (ref 6–23)
CO2: 29 mEq/L (ref 19–32)
Calcium: 9.9 mg/dL (ref 8.4–10.5)
Chloride: 101 mEq/L (ref 96–112)
Creatinine, Ser: 1.09 mg/dL (ref 0.40–1.20)
GFR: 49.13 mL/min — ABNORMAL LOW (ref >60.00)
Glucose, Bld: 126 mg/dL — ABNORMAL HIGH (ref 70–99)
Potassium: 4.2 mEq/L (ref 3.5–5.1)
Sodium: 139 mEq/L (ref 135–145)
Total Bilirubin: 0.7 mg/dL (ref 0.2–1.2)
Total Protein: 7.6 g/dL (ref 6.0–8.3)

## 2021-11-27 LAB — LIPID PANEL
Cholesterol: 195 mg/dL (ref 0–200)
HDL: 89.3 mg/dL (ref >39.00)
LDL Cholesterol: 81 mg/dL (ref 0–99)
NonHDL: 106.14
Total CHOL/HDL Ratio: 2
Triglycerides: 127 mg/dL (ref 0.0–149.0)
VLDL: 25.4 mg/dL (ref 0.0–40.0)

## 2021-11-27 LAB — HEMOGLOBIN A1C: Hgb A1c MFr Bld: 7 % — ABNORMAL HIGH (ref 4.6–6.5)

## 2021-11-27 LAB — VITAMIN D 25 HYDROXY (VIT D DEFICIENCY, FRACTURES): VITD: 66.43 ng/mL (ref 30.00–100.00)

## 2021-11-27 NOTE — Patient Instructions (Signed)

## 2021-11-27 NOTE — Progress Notes (Signed)
Established Patient Office Visit  Subjective:  Patient ID: Briana Craig, female    DOB: 1946-09-24  Age: 76 y.o. MRN: [id number]  CC:  Chief Complaint  Patient presents with   Diabetes   Hyperlipidemia   Follow-up    HPI Briana Craig presents for f/u dm , chol .    She lost her husband in [place] but has been doing ok since.  She is living alone now and her MD is worsening.   She also wanted to discuss the osteoporosis but decided she does not want any meds at this time--- she will con't ca and vita d and weight bearing exercise   DIABETES  Blood Sugar ranges-not checking   Polyuria- no New Visual problems- no Hypoglycemic symptoms- no Other side effects-no Medication compliance - good Last eye exam- frequent  Foot exam- today  HYPERLIPIDEMIA  Medication compliance- good RUQ pain- no  Muscle aches- no Other side effects-no     Past Medical History:  Diagnosis Date   Breast cancer ([place]) 12/2018   right DCIS   Dysphagia    Esophageal cancer (HCC)    GERD (gastroesophageal reflux disease)    Hyperlipemia    Hypertension    no meds now   Impaired fasting blood sugar    Personal history of radiation therapy    PONV (postoperative nausea and vomiting)    Pre-diabetes     Past Surgical History:  Procedure Laterality Date   BREAST BIOPSY Right 12/23/2018   BREAST LUMPECTOMY Right 01/18/2019   BREAST LUMPECTOMY WITH RADIOACTIVE SEED LOCALIZATION Right 01/18/2019   Procedure: RIGHT BREAST CENTRAL LUMPECTOMY WITH RADIOACTIVE SEED LOCALIZATION;  Surgeon: Rolm Bookbinder, MD;  Location: [place];  Service: General;  Laterality: Right;   DIRECT LARYNGOSCOPY WITH BOTOX INJECTION     tx paralysed vocal cord   ESOPHAGOGASTRODUODENOSCOPY  11/17/2011   Procedure: ESOPHAGOGASTRODUODENOSCOPY (EGD);  Surgeon: Owens Loffler, MD;  Location: [facility];  Service: Endoscopy;  Laterality: N/A;   ESOPHAGUS SURGERY     EYE SURGERY     both cataracts    GANGLION CYST EXCISION Left 01/26/2013   Procedure: LEFT FLEXOR CARPI RADIALIS  RELEASE AND SCAPHO TRAPEZIAL TRAPEZOID DEBRIDEMENT ;  Surgeon: Tennis Must, MD;  Location: [place];  Service: Orthopedics;  Laterality: Left;    Family History  Problem Relation Age of Onset   Alcohol abuse Sister    AAA (abdominal aortic aneurysm) Mother    COPD Father    Colon cancer Neg Hx     Social History   Socioeconomic History   Marital status: Widowed    Spouse name: Sonia Side   Number of children: 0   Years of education: 18   Highest education level: Not on file  Occupational History   Occupation: RETIRED     Employer: OTHER  Tobacco Use   Smoking status: Former    Types: Cigarettes    Quit date: 08/30/1987    Years since quitting: 34.2   Smokeless tobacco: Never  Substance and Sexual Activity   Alcohol use: No   Drug use: Never   Sexual activity: Yes    Partners: Male    Birth control/protection: Post-menopausal  Other Topics Concern   Not on file  Social History Narrative   Marital Status:  Married Sonia Side)   Children:  None    Pets: Dog (1)    Living Situation: Lives with husband.   Occupation:  Retired Visual merchandiser)    Education: Brewing technologist  Degree    Tobacco Use/Exposure:  Formal Smoker    Alcohol Use:  Occasional   Drug Use:  None   Diet:  Regular   Exercise:  Walking 1 mile 5 times per week.    Hobbies:  Reading                Social Determinants of Health   Financial Resource Strain: Low Risk    Difficulty of Paying Living Expenses: Not hard at all  Food Insecurity: No Food Insecurity   Worried About Charity fundraiser in the Last Year: Never true   Arboriculturist in the Last Year: Never true  Transportation Needs: No Transportation Needs   Lack of Transportation (Medical): No   Lack of Transportation (Non-Medical): No  Physical Activity: Insufficiently Active   Days of Exercise per Week: 4 days   Minutes of Exercise per Session: 30 min   Stress: No Stress Concern Present   Feeling of Stress : Not at all  Social Connections: Moderately Integrated   Frequency of Communication with Friends and Family: More than three times a week   Frequency of Social Gatherings with Friends and Family: Once a week   Attends Religious Services: 1 to 4 times per year   Active Member of [employer] or Organizations: No   Attends Archivist Meetings: Never   Marital Status: Married  Human resources officer Violence: Not At Risk   Fear of Current or Ex-Partner: No   Emotionally Abused: No   Physically Abused: No   Sexually Abused: No    Outpatient Medications Prior to Visit  Medication Sig Dispense Refill   Accu-Chek Softclix Lancets lancets Use to check blood sugar once a day.  DX  E11.9 100 each 1   Alcohol Swabs (ALCOHOL WIPES) 70 % PADS To use to check sugars 100 each 1   Blood Glucose Calibration (ACCU-CHEK AVIVA) SOLN Use to check control when opening a new bottle of strips.  DX E11.9 1 each 1   Blood Glucose Monitoring Suppl (ACCU-CHEK AVIVA PLUS) w/Device KIT Use to check blood sugar once a day.  DX E11.9 1 kit 0   Blood Glucose Monitoring Suppl (TRUE METRIX AIR GLUCOSE METER) DEVI Use as directed to check glucose twice daily. E11.9 1 each 0   CALCIUM-VITAMIN D PO Take 600 mg by mouth daily.     cetirizine (ZYRTEC) 10 MG tablet Take 10 mg by mouth as needed for allergies.     famotidine (PEPCID) 40 MG tablet Take 1 tablet (40 mg total) by mouth at bedtime. 90 tablet 1   fluticasone (FLONASE) 50 MCG/ACT nasal spray Place 2 sprays into the nose daily as needed.      glucose blood (ACCU-CHEK AVIVA PLUS) test strip Use to check blood sugar once daily.  DX  E 11.9 100 strip 1   glucose blood (TRUE METRIX BLOOD GLUCOSE TEST) test strip Use as instructed 100 each 12   latanoprost (XALATAN) 0.005 % ophthalmic solution Place 1 drop into both eyes daily.     Multiple Vitamins-Minerals (MULTIVITAMIN PO) Take 1 tablet by mouth daily.     Multiple  Vitamins-Minerals (PRESERVISION AREDS) CAPS Take by mouth.     Omega-3 Fatty Acids (FISH OIL PO) Take 1,200 mg by mouth daily.     pantoprazole (PROTONIX) 40 MG tablet Take 1 tablet (40 mg total) by mouth daily. 90 tablet 1   simvastatin (ZOCOR) 20 MG tablet Take 1 tablet (20 mg total) by mouth  daily at 6 PM. 90 tablet 1   TRUEplus Lancets 33G MISC Use as directed. Check glucose up to twice daily. E11.9 100 each 11   No facility-administered medications prior to visit.    Allergies  Allergen Reactions   Taxol [Paclitaxel] Hives and Itching   Tramadol     "Sweaty, shaky and faint"   Carboplatin Hives and Itching   Codeine    Compazine    Compazine  [Prochlorperazine Maleate] Other (See Comments)    Severe chest pain   Penicillins    Prochlorperazine Edisylate     Other reaction(s): Other (See Comments) Severe chest pain    ROS Review of Systems  Constitutional:  Negative for appetite change, diaphoresis, fatigue and unexpected weight change.  Eyes:  Negative for pain, redness and visual disturbance.  Respiratory:  Negative for cough, chest tightness, shortness of breath and wheezing.   Cardiovascular:  Negative for chest pain, palpitations and leg swelling.  Endocrine: Negative for cold intolerance, heat intolerance, polydipsia, polyphagia and polyuria.  Genitourinary:  Negative for difficulty urinating, dysuria and frequency.  Neurological:  Negative for dizziness, light-headedness, numbness and headaches.  Psychiatric/Behavioral:  Negative for decreased concentration, dysphoric mood, self-injury, sleep disturbance and suicidal ideas. The patient is not nervous/anxious.      Objective:    Physical Exam Vitals and nursing note reviewed.  Constitutional:      Appearance: She is well-developed.  HENT:     Head: Normocephalic and atraumatic.  Eyes:     Conjunctiva/sclera: Conjunctivae normal.  Neck:     Thyroid: No thyromegaly.     Vascular: No carotid bruit or JVD.   Cardiovascular:     Rate and Rhythm: Normal rate and regular rhythm.     Heart sounds: Normal heart sounds. No murmur heard. Pulmonary:     Effort: Pulmonary effort is normal. No respiratory distress.     Breath sounds: Normal breath sounds. No wheezing or rales.  Chest:     Chest wall: No tenderness.  Musculoskeletal:     Cervical back: Normal range of motion and neck supple.  Neurological:     Mental Status: She is alert and oriented to person, place, and time.   Diabetic Foot Exam - Simple   Simple Foot Form Diabetic Foot exam was performed with the following findings: Yes 11/27/2021  1:27 PM  Visual Inspection No deformities, no ulcerations, no other skin breakdown bilaterally: Yes Sensation Testing Intact to touch and monofilament testing bilaterally: Yes Pulse Check Posterior Tibialis and Dorsalis pulse intact bilaterally: Yes Comments      BP 130/90 (BP Location: Left Arm, Patient Position: Sitting, Cuff Size: Normal)    Pulse 86    Temp 97.8 F (36.6 C) (Oral)    Resp 18    Ht _0  (1.575 m)    Wt 122 lb 3.2 oz (55.4 kg)    SpO2 100%    BMI 22.35 kg/m  Wt Readings from Last 3 Encounters:  11/27/21 122 lb 3.2 oz (55.4 kg)  05/27/21 121 lb 12.8 oz (55.2 kg)  03/25/21 118 lb (53.5 kg)     Health Maintenance Due  Topic Date Due   URINE MICROALBUMIN  10/10/2021   HEMOGLOBIN A1C  11/20/2021    There are no preventive care reminders to display for this patient.  Lab Results  Component Value Date   TSH 2.147 04/24/2013   Lab Results  Component Value Date   WBC 5.4 05/20/2021   HGB 12.6 05/20/2021   HCT  37.2 05/20/2021   MCV 91.1 05/20/2021   PLT 198.0 05/20/2021   Lab Results  Component Value Date   NA 137 05/20/2021   K 4.0 05/20/2021   CO2 28 05/20/2021   GLUCOSE 110 (H) 05/20/2021   BUN 18 05/20/2021   CREATININE 1.03 05/20/2021   BILITOT 0.6 05/20/2021   ALKPHOS 47 05/20/2021   AST 20 05/20/2021   ALT 15 05/20/2021   PROT 6.9 05/20/2021    ALBUMIN 4.1 05/20/2021   CALCIUM 9.5 05/20/2021   GFR 52.78 (L) 05/20/2021   Lab Results  Component Value Date   CHOL 184 05/20/2021   Lab Results  Component Value Date   HDL 85.00 05/20/2021   Lab Results  Component Value Date   LDLCALC 82 05/20/2021   Lab Results  Component Value Date   TRIG 86.0 05/20/2021   Lab Results  Component Value Date   CHOLHDL 2 05/20/2021   Lab Results  Component Value Date   HGBA1C 7.0 (H) 05/20/2021      Assessment & Plan:   Problem List Items Addressed This Visit       Unprioritized   Vitamin D deficiency   Age-related osteoporosis without current pathological fracture - Primary    Pt does not want to take a med Recheck 2 years  con't calcium / vita d  Weight bearing exercise       Diet-controlled diabetes mellitus ([place])    Check labs        Hyperlipidemia associated with type 2 diabetes mellitus ([place])    Encourage heart healthy diet such as MIND or DASH diet, increase exercise, avoid trans fats, simple carbohydrates and processed foods, consider a krill or fish or flaxseed oil cap daily.       Other Visit Diagnoses     Type 2 diabetes mellitus with hyperglycemia, without long-term current use of insulin ([place])           No orders of the defined types were placed in this encounter.   Follow-up: Return in about 6 months (around 05/27/2022), or if symptoms worsen or fail to improve, for annual exam, fasting.    Ann Held, DO

## 2021-11-27 NOTE — Assessment & Plan Note (Signed)
Check labs 

## 2021-11-27 NOTE — Assessment & Plan Note (Signed)
Encourage heart healthy diet such as MIND or DASH diet, increase exercise, avoid trans fats, simple carbohydrates and processed foods, consider a krill or fish or flaxseed oil cap daily.  °

## 2021-11-27 NOTE — Assessment & Plan Note (Signed)
Pt does not want to take a med Recheck 2 years  con't calcium / vita d  Weight bearing exercise

## 2021-12-10 ENCOUNTER — Other Ambulatory Visit: Payer: Self-pay | Admitting: Family Medicine

## 2021-12-10 DIAGNOSIS — C159 Malignant neoplasm of esophagus, unspecified: Secondary | ICD-10-CM

## 2021-12-10 DIAGNOSIS — K219 Gastro-esophageal reflux disease without esophagitis: Secondary | ICD-10-CM

## 2021-12-10 DIAGNOSIS — E785 Hyperlipidemia, unspecified: Secondary | ICD-10-CM

## 2021-12-22 DIAGNOSIS — H353211 Exudative age-related macular degeneration, right eye, with active choroidal neovascularization: Secondary | ICD-10-CM | POA: Diagnosis not present

## 2021-12-30 ENCOUNTER — Ambulatory Visit: Payer: Medicare PPO

## 2021-12-30 NOTE — Progress Notes (Deleted)
Subjective:   Briana Craig is a 76 y.o. female who presents for Medicare Annual (Subsequent) preventive examination.  I connected with  Briana Craig on 12/30/21 by a audio enabled telemedicine application and verified that I am speaking with the correct person using two identifiers.  Patient Location: Home  Provider Location: Home Office  I discussed the limitations of evaluation and management by telemedicine. The patient expressed understanding and agreed to proceed.   Review of Systems           Objective:    There were no vitals filed for this visit. There is no height or weight on file to calculate BMI.  Advanced Directives 03/25/2021 12/19/2020 11/24/2019 01/18/2019 01/09/2019 12/11/2016 11/25/2015  Does Patient Have a Medical Advance Directive? _0  Yes Yes  Type of Advance Directive - Living will Healthcare Power of [place];Living will [place];Living will Living will [place];Living will [place];Living will  Does patient want to make changes to medical advance directive? - - No - Patient declined No - Patient declined No - Patient declined - No - Patient declined  Copy of Healthcare Power of Attorney in Chart? - - No - copy requested No - copy requested No - copy requested No - copy requested No - copy requested  Would patient like information on creating a medical advance directive? - - - - - No - Patient declined -  Pre-existing out of facility DNR order (yellow form or pink MOST form) - - - - - - -    Current Medications (verified) Outpatient Encounter Medications as of 12/30/2021  Medication Sig   Accu-Chek Softclix Lancets lancets Use to check blood sugar once a day.  DX  E11.9   Alcohol Swabs (ALCOHOL WIPES) 70 % PADS To use to check sugars   Blood Glucose Calibration (ACCU-CHEK AVIVA) SOLN Use to check control when opening a new bottle of strips.  DX E11.9   Blood Glucose Monitoring  Suppl (ACCU-CHEK AVIVA PLUS) w/Device KIT Use to check blood sugar once a day.  DX E11.9   Blood Glucose Monitoring Suppl (TRUE METRIX AIR GLUCOSE METER) DEVI Use as directed to check glucose twice daily. E11.9   CALCIUM-VITAMIN D PO Take 600 mg by mouth daily.   cetirizine (ZYRTEC) 10 MG tablet Take 10 mg by mouth as needed for allergies.   famotidine (PEPCID) 40 MG tablet TAKE 1 TABLET AT BEDTIME   fluticasone (FLONASE) 50 MCG/ACT nasal spray Place 2 sprays into the nose daily as needed.    glucose blood (ACCU-CHEK AVIVA PLUS) test strip Use to check blood sugar once daily.  DX  E 11.9   glucose blood (TRUE METRIX BLOOD GLUCOSE TEST) test strip Use as instructed   latanoprost (XALATAN) 0.005 % ophthalmic solution Place 1 drop into both eyes daily.   Multiple Vitamins-Minerals (MULTIVITAMIN PO) Take 1 tablet by mouth daily.   Multiple Vitamins-Minerals (PRESERVISION AREDS) CAPS Take by mouth.   Omega-3 Fatty Acids (FISH OIL PO) Take 1,200 mg by mouth daily.   pantoprazole (PROTONIX) 40 MG tablet TAKE 1 TABLET EVERY DAY (NEED MD APPOINTMENT)   simvastatin (ZOCOR) 20 MG tablet TAKE 1 TABLET EVERY DAY AT 6 P.M. (NEED MD APPOINTMENT)   TRUEplus Lancets 33G MISC Use as directed. Check glucose up to twice daily. E11.9   No facility-administered encounter medications on file as of 12/30/2021.    Allergies (verified) Taxol [paclitaxel], Tramadol, Carboplatin, Codeine, Compazine, Compazine  [  prochlorperazine maleate], Penicillins, and Prochlorperazine edisylate   History: Past Medical History:  Diagnosis Date   Breast cancer ([place]) 12/2018   right DCIS   Dysphagia    Esophageal cancer ([facility])    GERD (gastroesophageal reflux disease)    Hyperlipemia    Hypertension    no meds now   Impaired fasting blood sugar    Personal history of radiation therapy    PONV (postoperative nausea and vomiting)    Pre-diabetes    Past Surgical History:  Procedure Laterality Date   BREAST BIOPSY Right  12/23/2018   BREAST LUMPECTOMY Right 01/18/2019   BREAST LUMPECTOMY WITH RADIOACTIVE SEED LOCALIZATION Right 01/18/2019   Procedure: RIGHT BREAST CENTRAL LUMPECTOMY WITH RADIOACTIVE SEED LOCALIZATION;  Surgeon: Rolm Bookbinder, MD;  Location: [place];  Service: General;  Laterality: Right;   DIRECT LARYNGOSCOPY WITH BOTOX INJECTION     tx paralysed vocal cord   ESOPHAGOGASTRODUODENOSCOPY  11/17/2011   Procedure: ESOPHAGOGASTRODUODENOSCOPY (EGD);  Surgeon: Owens Loffler, MD;  Location: [facility];  Service: Endoscopy;  Laterality: N/A;   ESOPHAGUS SURGERY     EYE SURGERY     both cataracts   GANGLION CYST EXCISION Left 01/26/2013   Procedure: LEFT FLEXOR CARPI RADIALIS  RELEASE AND SCAPHO TRAPEZIAL TRAPEZOID DEBRIDEMENT ;  Surgeon: Tennis Must, MD;  Location: [place];  Service: Orthopedics;  Laterality: Left;   Family History  Problem Relation Age of Onset   Alcohol abuse Sister    AAA (abdominal aortic aneurysm) Mother    COPD Father    Colon cancer Neg Hx    Social History   Socioeconomic History   Marital status: Widowed    Spouse name: Sonia Side   Number of children: 0   Years of education: 18   Highest education level: Not on file  Occupational History   Occupation: RETIRED     Employer: OTHER  Tobacco Use   Smoking status: Former    Types: Cigarettes    Quit date: 08/30/1987    Years since quitting: 34.3   Smokeless tobacco: Never  Substance and Sexual Activity   Alcohol use: No   Drug use: Never   Sexual activity: Yes    Partners: Male    Birth control/protection: Post-menopausal  Other Topics Concern   Not on file  Social History Narrative   Marital Status:  Married Sonia Side)   Children:  None    Pets: Dog (1)    Living Situation: Lives with husband.   Occupation:  Retired Visual merchandiser)    Education: Master's Degree    Tobacco Use/Exposure:  Formal Smoker    Alcohol Use:  Occasional   Drug Use:  None   Diet:  Regular    Exercise:  Walking 1 mile 5 times per week.    Hobbies:  Reading                Social Determinants of Health   Financial Resource Strain: Not on file  Food Insecurity: Not on file  Transportation Needs: Not on file  Physical Activity: Not on file  Stress: Not on file  Social Connections: Not on file    Tobacco Counseling Counseling given: Not Answered   Clinical Intake:                 Diabetic?Yes Nutrition Risk Assessment:  Has the patient had any N/V/D within the last 2 months?  {YES/NO:21197} Does the patient have any non-healing wounds?  {YES/NO:21197} Has the patient had any  unintentional weight loss or weight gain?  {YES/NO:21197}  Diabetes:  Is the patient diabetic?  {YES/NO:21197} If diabetic, was a CBG obtained today?  {YES/NO:21197} Did the patient bring in their glucometer from home?  {YES/NO:21197} How often do you monitor your CBG's? ***.   Financial Strains and Diabetes Management:  Are you having any financial strains with the device, your supplies or your medication? {YES/NO:21197}.  Does the patient want to be seen by Chronic Care Management for management of their diabetes?  {YES/NO:21197} Would the patient like to be referred to a Nutritionist or for Diabetic Management?  {YES/NO:21197}  Diabetic Exams:  {Diabetic Eye Exam:2101801} {Diabetic Foot Exam:2101802}          Activities of Daily Living No flowsheet data found.  Patient Care Team: Carollee Herter, Alferd Apa, DO as PCP - General (Family Medicine) Calvert Cantor, MD as Consulting Physician (Ophthalmology) Thea Silversmith, MD as Referring Physician (Radiation Oncology) Surgery, [facility] (General Surgery)  Indicate any recent Medical Services you may have received from other than [facility] providers in the past year (date may be approximate).     Assessment:   This is a routine wellness examination for Donya.  Hearing/Vision screen No results found.  Dietary  issues and exercise activities discussed:     Goals Addressed   None    Depression Screen PHQ 2/9 Scores 12/19/2020 11/24/2019 05/19/2018 11/16/2016 11/25/2015 11/25/2015 02/25/2015  PHQ - 2 Score 0 0 0 0 0 0 0    Fall Risk Fall Risk  12/19/2020 11/24/2019 09/25/2019 09/19/2018 06/03/2018  Falls in the past year? 0 0 0 0 No  Comment - - [facility] Telephone Survey: data to providers prior to load [employer] Telephone Survey: data to providers prior to load [facility] Telephone Survey: data to providers prior to load  Number falls in past yr: 0 0 - - -  Injury with Fall? 0 0 - - -  Follow up Falls prevention discussed Education provided;Falls prevention discussed - - -    FALL RISK PREVENTION PERTAINING TO THE HOME:  Any stairs in or around the home? {YES/NO:21197} If so, are there any without handrails? {YES/NO:21197} Home free of loose throw rugs in walkways, pet beds, electrical cords, etc? {YES/NO:21197} Adequate lighting in your home to reduce risk of falls? {YES/NO:21197}  ASSISTIVE DEVICES UTILIZED TO PREVENT FALLS:  Life alert? {YES/NO:21197} Use of a cane, walker or w/c? {YES/NO:21197} Grab bars in the bathroom? {YES/NO:21197} Shower chair or bench in shower? {YES/NO:21197} Elevated toilet seat or a handicapped toilet? {YES/NO:21197}  TIMED UP AND GO:  Was the test performed? {YES/NO:21197}.  Length of time to ambulate 10 feet: *** sec.   {Appearance of GQBV:6945038}  Cognitive Function:        Immunizations Immunization History  Administered Date(s) Administered   Influenza, High Dose Seasonal PF 08/16/2014, 08/13/2015, 08/20/2016, 08/02/2017, 08/16/2018, 07/17/2019   Influenza-Unspecified 08/13/2015, 08/01/2020, 08/02/2021   PFIZER(Purple Top)SARS-COV-2 Vaccination 11/21/2019, 12/11/2019, 08/02/2020, 04/24/2021   Pfizer Covid-19 Vaccine Bivalent Booster 54yr & up 08/12/2021   Pneumococcal Conjugate-13 09/03/2014   Pneumococcal Polysaccharide-23 12/03/2010, 11/16/2016   Tdap  05/01/2013   Zoster Recombinat (Shingrix) 06/04/2018, 10/01/2018    {TDAP status:2101805}  Flu Vaccine status: Up to date  Pneumococcal vaccine status: Up to date  Covid-19 vaccine status: Information provided on how to obtain vaccines.   Qualifies for Shingles Vaccine? Yes   Zostavax completed Yes   Shingrix Completed?: Yes  Screening Tests Health Maintenance  Topic Date Due   OPHTHALMOLOGY EXAM  12/18/2021  MAMMOGRAM  03/18/2022   HEMOGLOBIN A1C  05/27/2022   FOOT EXAM  11/27/2022   URINE MICROALBUMIN  11/27/2022   TETANUS/TDAP  05/02/2023   Pneumonia Vaccine 16+ Years old  Completed   INFLUENZA VACCINE  Completed   DEXA SCAN  Completed   COVID-19 Vaccine  Completed   Hepatitis C Screening  Completed   Zoster Vaccines- Shingrix  Completed   HPV VACCINES  Aged Out   COLONOSCOPY (Pts 45-42yr Insurance coverage will need to be confirmed)  Discontinued    Health Maintenance  Health Maintenance Due  Topic Date Due   OPHTHALMOLOGY EXAM  12/18/2021    Colorectal cancer screening: No longer required.   {Mammogram status:21018020}  Bone Density status: Completed 07/17/2021. Results reflect: Bone density results: OSTEOPOROSIS. Repeat every 2 years.  Lung Cancer Screening: (Low Dose CT Chest recommended if Age 78-80years, 30 pack-year currently smoking OR have quit w/in 15years.) does not qualify.     Additional Screening:  Hepatitis C Screening: does not qualify  Vision Screening: Recommended annual ophthalmology exams for early detection of glaucoma and other disorders of the eye. Is the patient up to date with their annual eye exam?  {YES/NO:21197} Who is the provider or what is the name of the office in which the patient attends annual eye exams? *** If pt is not established with a provider, would they like to be referred to a provider to establish care? {YES/NO:21197}.   Dental Screening: Recommended annual dental exams for proper oral hygiene  Community  Resource Referral / Chronic Care Management: CRR required this visit?  {YES/NO:21197}  CCM required this visit?  {YES/NO:21197}     Plan:     I have personally reviewed and noted the following in the patients chart:   Medical and social history Use of alcohol, tobacco or illicit drugs  Current medications and supplements including opioid prescriptions.  Functional ability and status Nutritional status Physical activity Advanced directives List of other physicians Hospitalizations, surgeries, and ER visits in previous 12 months Vitals Screenings to include cognitive, depression, and falls Referrals and appointments  In addition, I have reviewed and discussed with patient certain preventive protocols, quality metrics, and best practice recommendations. A written personalized care plan for preventive services as well as general preventive health recommendations were provided to patient.     SDuard BradyChism, CMA   12/30/2021   Nurse Notes: ***

## 2022-01-02 NOTE — Progress Notes (Signed)
Subjective:   Briana Craig is a 76 y.o. female who presents for Medicare Annual (Subsequent) preventive examination.  I connected with  Briana Craig on 01/06/22 by a audio enabled telemedicine application and verified that I am speaking with the correct person using two identifiers.  Patient Location: Home  Provider Location: Office/Clinic  I discussed the limitations of evaluation and management by telemedicine. The patient expressed understanding and agreed to proceed.   Review of Systems     Cardiac Risk Factors include: advanced age (>37mn, >>44women);hypertension     Objective:    There were no vitals filed for this visit. There is no height or weight on file to calculate BMI.  Advanced Directives 01/06/2022 03/25/2021 12/19/2020 11/24/2019 01/18/2019 01/09/2019 12/11/2016  Does Patient Have a Medical Advance Directive? _0  Yes Yes  Type of [place] will;Out of facility DNR (pink MOST or yellow form) - Living will [place] will [place] will Living will [place] will  Does patient want to make changes to medical advance directive? - - - No - Patient declined No - Patient declined No - Patient declined -  Copy of [place] Chart? No - copy requested - - No - copy requested No - copy requested No - copy requested No - copy requested  Would patient like information on creating a medical advance directive? - - - - - - No - Patient declined  Pre-existing out of facility DNR order (yellow form or pink MOST form) - - - - - - -    Current Medications (verified) Outpatient Encounter Medications as of 01/06/2022  Medication Sig   Accu-Chek Softclix Lancets lancets Use to check blood sugar once a day.  DX  E11.9   Alcohol Swabs (ALCOHOL WIPES) 70 % PADS To use to check sugars   Blood Glucose Calibration (ACCU-CHEK AVIVA)  SOLN Use to check control when opening a new bottle of strips.  DX E11.9   Blood Glucose Monitoring Suppl (ACCU-CHEK AVIVA PLUS) w/Device KIT Use to check blood sugar once a day.  DX E11.9   Blood Glucose Monitoring Suppl (TRUE METRIX AIR GLUCOSE METER) DEVI Use as directed to check glucose twice daily. E11.9   CALCIUM-VITAMIN D PO Take 600 mg by mouth daily.   cetirizine (ZYRTEC) 10 MG tablet Take 10 mg by mouth as needed for allergies.   famotidine (PEPCID) 40 MG tablet TAKE 1 TABLET AT BEDTIME   fluticasone (FLONASE) 50 MCG/ACT nasal spray Place 2 sprays into the nose daily as needed.    glucose blood (ACCU-CHEK AVIVA PLUS) test strip Use to check blood sugar once daily.  DX  E 11.9   glucose blood (TRUE METRIX BLOOD GLUCOSE TEST) test strip Use as instructed   latanoprost (XALATAN) 0.005 % ophthalmic solution Place 1 drop into both eyes daily.   Multiple Vitamins-Minerals (MULTIVITAMIN PO) Take 1 tablet by mouth daily.   Multiple Vitamins-Minerals (PRESERVISION AREDS) CAPS Take by mouth.   Omega-3 Fatty Acids (FISH OIL PO) Take 1,200 mg by mouth daily.   pantoprazole (PROTONIX) 40 MG tablet TAKE 1 TABLET EVERY DAY (NEED MD APPOINTMENT)   simvastatin (ZOCOR) 20 MG tablet TAKE 1 TABLET EVERY DAY AT 6 P.M. (NEED MD APPOINTMENT)   TRUEplus Lancets 33G MISC Use as directed. Check glucose up to twice daily. E11.9   No facility-administered encounter medications on file as of 01/06/2022.  Allergies (verified) Taxol [paclitaxel], Tramadol, Carboplatin, Codeine, Compazine, Compazine  [prochlorperazine maleate], Penicillins, and Prochlorperazine edisylate   History: Past Medical History:  Diagnosis Date   Breast cancer ([place]) 12/2018   right DCIS   Dysphagia    Esophageal cancer ([place])    GERD (gastroesophageal reflux disease)    Hyperlipemia    Hypertension    no meds now   Impaired fasting blood sugar    Personal history of radiation therapy    PONV (postoperative nausea and vomiting)     Pre-diabetes    Past Surgical History:  Procedure Laterality Date   BREAST BIOPSY Right 12/23/2018   BREAST LUMPECTOMY Right 01/18/2019   BREAST LUMPECTOMY WITH RADIOACTIVE SEED LOCALIZATION Right 01/18/2019   Procedure: RIGHT BREAST CENTRAL LUMPECTOMY WITH RADIOACTIVE SEED LOCALIZATION;  Surgeon: Rolm Bookbinder, MD;  Location: [facility];  Service: General;  Laterality: Right;   DIRECT LARYNGOSCOPY WITH BOTOX INJECTION     tx paralysed vocal cord   ESOPHAGOGASTRODUODENOSCOPY  11/17/2011   Procedure: ESOPHAGOGASTRODUODENOSCOPY (EGD);  Surgeon: Owens Loffler, MD;  Location: [facility];  Service: Endoscopy;  Laterality: N/A;   ESOPHAGUS SURGERY     EYE SURGERY     both cataracts   GANGLION CYST EXCISION Left 01/26/2013   Procedure: LEFT FLEXOR CARPI RADIALIS  RELEASE AND SCAPHO TRAPEZIAL TRAPEZOID DEBRIDEMENT ;  Surgeon: Tennis Must, MD;  Location: [facility];  Service: Orthopedics;  Laterality: Left;   Family History  Problem Relation Age of Onset   Alcohol abuse Sister    AAA (abdominal aortic aneurysm) Mother    COPD Father    Colon cancer Neg Hx    Social History   Socioeconomic History   Marital status: Widowed    Spouse name: Sonia Side   Number of children: 0   Years of education: 18   Highest education level: Not on file  Occupational History   Occupation: RETIRED     Employer: OTHER  Tobacco Use   Smoking status: Former    Types: Cigarettes    Quit date: 08/30/1987    Years since quitting: 34.3   Smokeless tobacco: Never  Substance and Sexual Activity   Alcohol use: No   Drug use: Never   Sexual activity: Yes    Partners: Male    Birth control/protection: Post-menopausal  Other Topics Concern   Not on file  Social History Narrative   Marital Status:  Married Sonia Side)   Children:  None    Pets: Dog (1)    Living Situation: Lives with husband.   Occupation:  Retired Visual merchandiser)    Education: Master's Degree    Tobacco  Use/Exposure:  Formal Smoker    Alcohol Use:  Occasional   Drug Use:  None   Diet:  Regular   Exercise:  Walking 1 mile 5 times per week.    Hobbies:  Reading                Social Determinants of Health   Financial Resource Strain: Not on file  Food Insecurity: Not on file  Transportation Needs: Not on file  Physical Activity: Sufficiently Active   Days of Exercise per Week: 7 days   Minutes of Exercise per Session: 60 min  Stress: Not on file  Social Connections: Not on file    Tobacco Counseling Counseling given: Not Answered   Clinical Intake:  Pre-visit preparation completed: Yes  Pain : No/denies pain     Nutritional Risks: None Diabetes: No CBG done?:  No Did pt. bring in CBG monitor from home?: No  How often do you need to have someone help you when you read instructions, pamphlets, or other written materials from your doctor or pharmacy?: 1 - Never  Diabetic?yes Nutrition Risk Assessment:  Has the patient had any N/V/D within the last 2 months?  No  Does the patient have any non-healing wounds?  No  Has the patient had any unintentional weight loss or weight gain?  No   Diabetes:  Is the patient diabetic?  No  If diabetic, was a CBG obtained today?  No  Did the patient bring in their glucometer from home?  No  How often do you monitor your CBG's? N/A.   Financial Strains and Diabetes Management:  Are you having any financial strains with the device, your supplies or your medication? No .  Does the patient want to be seen by Chronic Care Management for management of their diabetes?  No  Would the patient like to be referred to a Nutritionist or for Diabetic Management?  No   Diabetic Exams:  Diabetic Eye Exam: Overdue for diabetic eye exam. Pt has been advised about the importance in completing this exam. Patient advised to call and schedule an eye exam. Diabetic Foot Exam: Completed 11/27/21   Interpreter Needed?: No  Information entered by  :: [place] of Daily Living In your present state of health, do you have any difficulty performing the following activities: 01/06/2022  Hearing? N  Vision? N  Difficulty concentrating or making decisions? N  Walking or climbing stairs? N  Dressing or bathing? N  Doing errands, shopping? N  Preparing Food and eating ? N  Using the Toilet? N  In the past six months, have you accidently leaked urine? N  Do you have problems with loss of bowel control? N  Managing your Medications? N  Managing your Finances? N  Housekeeping or managing your Housekeeping? N  Some recent data might be hidden    Patient Care Team: Carollee Herter, Alferd Apa, DO as PCP - General (Family Medicine) Calvert Cantor, MD as Consulting Physician (Ophthalmology) Thea Silversmith, MD as Referring Physician (Radiation Oncology) Surgery, [facility] (General Surgery)  Indicate any recent Medical Services you may have received from other than [facility] providers in the past year (date may be approximate).     Assessment:   This is a routine wellness examination for Annalyssa.  Hearing/Vision screen No results found.  Dietary issues and exercise activities discussed: Current Exercise Habits: Home exercise routine, Type of exercise: walking, Time (Minutes): 60, Frequency (Times/Week): 7, Weekly Exercise (Minutes/Week): 420, Intensity: Mild, Exercise limited by: None identified   Goals Addressed   None    Depression Screen PHQ 2/9 Scores 01/06/2022 12/19/2020 11/24/2019 05/19/2018 11/16/2016 11/25/2015 11/25/2015  PHQ - 2 Score 0 0 0 0 0 0 0    Fall Risk Fall Risk  01/06/2022 12/19/2020 11/24/2019 09/25/2019 09/19/2018  Falls in the past year? 0 0 0 0 0  Comment - - - [employer] Telephone Survey: data to providers prior to load [employer] Survey: data to providers prior to load  Number falls in past yr: 0 0 0 - -  Injury with Fall? 0 0 0 - -  Risk for fall due to : No Fall Risks - - - -  Follow up - Falls  prevention discussed Education provided;Falls prevention discussed - -    FALL RISK PREVENTION PERTAINING TO THE HOME:  Any stairs  in or around the home? Yes  If so, are there any without handrails? Yes  Home free of loose throw rugs in walkways, pet beds, electrical cords, etc? Yes  Adequate lighting in your home to reduce risk of falls? Yes   ASSISTIVE DEVICES UTILIZED TO PREVENT FALLS:  Life alert? No  Use of a cane, walker or w/c? No  Grab bars in the bathroom? Yes  Shower chair or bench in shower? No  Elevated toilet seat or a handicapped toilet? Yes   TIMED UP AND GO:  Was the test performed? No  telephone visit   Cognitive Function:     6CIT Screen 01/06/2022  What Year? 0 points  What month? 0 points  What time? 0 points  Count back from 20 0 points  Months in reverse 0 points  Repeat phrase 0 points  Total Score 0    Immunizations Immunization History  Administered Date(s) Administered   Influenza, High Dose Seasonal PF 08/16/2014, 08/13/2015, 08/20/2016, 08/02/2017, 08/16/2018, 07/17/2019   Influenza-Unspecified 08/13/2015, 08/01/2020, 08/02/2021   PFIZER(Purple Top)SARS-COV-2 Vaccination 11/21/2019, 12/11/2019, 08/02/2020, 04/24/2021   Pfizer Covid-19 Vaccine Bivalent Booster 55yr & up 08/12/2021   Pneumococcal Conjugate-13 09/03/2014   Pneumococcal Polysaccharide-23 12/03/2010, 11/16/2016   Tdap 05/01/2013   Zoster Recombinat (Shingrix) 06/04/2018, 10/01/2018    TDAP status: Up to date  Flu Vaccine status: Up to date  Pneumococcal vaccine status: Up to date  Covid-19 vaccine status: Completed vaccines  Qualifies for Shingles Vaccine? Yes   Zostavax completed No   Shingrix Completed?: Yes  Screening Tests Health Maintenance  Topic Date Due   OPHTHALMOLOGY EXAM  12/18/2021   MAMMOGRAM  03/18/2022   HEMOGLOBIN A1C  05/27/2022   FOOT EXAM  11/27/2022   URINE MICROALBUMIN  11/27/2022   TETANUS/TDAP  05/02/2023   Pneumonia Vaccine 65+ Years  old  Completed   INFLUENZA VACCINE  Completed   DEXA SCAN  Completed   COVID-19 Vaccine  Completed   Hepatitis C Screening  Completed   Zoster Vaccines- Shingrix  Completed   HPV VACCINES  Aged Out   COLONOSCOPY (Pts 45-448yrInsurance coverage will need to be confirmed)  Discontinued    Health Maintenance  Health Maintenance Due  Topic Date Due   OPHTHALMOLOGY EXAM  12/18/2021    Colorectal cancer screening: No longer required.   Mammogram status: Completed 03/18/21. Repeat every year  Bone Density status: Completed 07/17/21. Results reflect: Bone density results: OSTEOPOROSIS. Repeat every 2 years.  Lung Cancer Screening: (Low Dose CT Chest recommended if Age 78-80ears, 30 pack-year currently smoking OR have quit w/in 15years.) does not qualify.   Lung Cancer Screening Referral: N/A  Additional Screening:  Hepatitis C Screening: does not qualify; Completed 11/25/2015  Vision Screening: Recommended annual ophthalmology exams for early detection of glaucoma and other disorders of the eye. Is the patient up to date with their annual eye exam?  Yes  Who is the provider or what is the name of the office in which the patient attends annual eye exams? Dr. DiBing Plumef pt is not established with a provider, would they like to be referred to a provider to establish care? No .   Dental Screening: Recommended annual dental exams for proper oral hygiene  Community Resource Referral / Chronic Care Management: CRR required this visit?  No   CCM required this visit?  No      Plan:     I have personally reviewed and noted the following in the patients chart:  Medical and social history Use of alcohol, tobacco or illicit drugs  Current medications and supplements including opioid prescriptions.  Functional ability and status Nutritional status Physical activity Advanced directives List of other physicians Hospitalizations, surgeries, and ER visits in previous 12  months Vitals Screenings to include cognitive, depression, and falls Referrals and appointments  In addition, I have reviewed and discussed with patient certain preventive protocols, quality metrics, and best practice recommendations. A written personalized care plan for preventive services as well as general preventive health recommendations were provided to patient.  Due to this being a telephonic visit, the after visit summary with patients personalized plan was offered to patient via mail or my-chart. Patient preferred to pick up at office at next visit.     Duard Brady Keishia Ground, [place]   01/06/2022   Nurse Notes: none

## 2022-01-06 ENCOUNTER — Ambulatory Visit (INDEPENDENT_AMBULATORY_CARE_PROVIDER_SITE_OTHER): Payer: Medicare PPO

## 2022-01-06 DIAGNOSIS — Z0000 Encounter for general adult medical examination without abnormal findings: Secondary | ICD-10-CM

## 2022-01-06 NOTE — Patient Instructions (Signed)
Briana Craig , Thank you for taking time to come for your Medicare Wellness Visit. I appreciate your ongoing commitment to your health goals. Please review the following plan we discussed and let me know if I can assist you in the future.   Screening recommendations/referrals: Colonoscopy: no longer needed Mammogram: 03/18/21 due 03/18/22 Bone Density: 07/17/21 due 07/18/23 Recommended yearly ophthalmology/optometry visit for glaucoma screening and checkup Recommended yearly dental visit for hygiene and checkup  Vaccinations: Influenza vaccine: up to date Pneumococcal vaccine: up to date Tdap vaccine: up to date Shingles vaccine: up to date   Covid-19:completed  Advanced directives: Yes, will bring to next visit  Conditions/risks identified: see problem list  Next appointment: Follow up in one year for your annual wellness visit    Preventive Care 51 Years and Older, Female Preventive care refers to lifestyle choices and visits with your health care provider that can promote health and wellness. What does preventive care include? A yearly physical exam. This is also called an annual well check. Dental exams once or twice a year. Routine eye exams. Ask your health care provider how often you should have your eyes checked. Personal lifestyle choices, including: Daily care of your teeth and gums. Regular physical activity. Eating a healthy diet. Avoiding tobacco and drug use. Limiting alcohol use. Practicing safe sex. Taking low-dose aspirin every day. Taking vitamin and mineral supplements as recommended by your health care provider. What happens during an annual well check? The services and screenings done by your health care provider during your annual well check will depend on your age, overall health, lifestyle risk factors, and family history of disease. Counseling  Your health care provider may ask you questions about your: Alcohol use. Tobacco use. Drug use. Emotional  well-being. Home and relationship well-being. Sexual activity. Eating habits. History of falls. Memory and ability to understand (cognition). Work and work Statistician. Reproductive health. Screening  You may have the following tests or measurements: Height, weight, and BMI. Blood pressure. Lipid and cholesterol levels. These may be checked every 5 years, or more frequently if you are over 1 years old. Skin check. Lung cancer screening. You may have this screening every year starting at age 63 if you have a 30-pack-year history of smoking and currently smoke or have quit within the past 15 years. Fecal occult blood test (FOBT) of the stool. You may have this test every year starting at age 78. Flexible sigmoidoscopy or colonoscopy. You may have a sigmoidoscopy every 5 years or a colonoscopy every 10 years starting at age 70. Hepatitis C blood test. Hepatitis B blood test. Sexually transmitted disease (STD) testing. Diabetes screening. This is done by checking your blood sugar (glucose) after you have not eaten for a while (fasting). You may have this done every 1-3 years. Bone density scan. This is done to screen for osteoporosis. You may have this done starting at age 20. Mammogram. This may be done every 1-2 years. Talk to your health care provider about how often you should have regular mammograms. Talk with your health care provider about your test results, treatment options, and if necessary, the need for more tests. Vaccines  Your health care provider may recommend certain vaccines, such as: Influenza vaccine. This is recommended every year. Tetanus, diphtheria, and acellular pertussis (Tdap, Td) vaccine. You may need a Td booster every 10 years. Zoster vaccine. You may need this after age 10. Pneumococcal 13-valent conjugate (PCV13) vaccine. One dose is recommended after age 30. Pneumococcal polysaccharide (  PPSV23) vaccine. One dose is recommended after age 49. Talk to your  health care provider about which screenings and vaccines you need and how often you need them. This information is not intended to replace advice given to you by your health care provider. Make sure you discuss any questions you have with your health care provider. Document Released: 11/15/2015 Document Revised: 07/08/2016 Document Reviewed: 08/20/2015 Elsevier Interactive Patient Education  2017 [employer] Prevention in the Home Falls can cause injuries. They can happen to people of all ages. There are many things you can do to make your home safe and to help prevent falls. What can I do on the outside of my home? Regularly fix the edges of walkways and driveways and fix any cracks. Remove anything that might make you trip as you walk through a door, such as a raised step or threshold. Trim any bushes or trees on the path to your home. Use bright outdoor lighting. Clear any walking paths of anything that might make someone trip, such as rocks or tools. Regularly check to see if handrails are loose or broken. Make sure that both sides of any steps have handrails. Any raised decks and porches should have guardrails on the edges. Have any leaves, snow, or ice cleared regularly. Use sand or salt on walking paths during winter. Clean up any spills in your garage right away. This includes oil or grease spills. What can I do in the bathroom? Use night lights. Install grab bars by the toilet and in the tub and shower. Do not use towel bars as grab bars. Use non-skid mats or decals in the tub or shower. If you need to sit down in the shower, use a plastic, non-slip stool. Keep the floor dry. Clean up any water that spills on the floor as soon as it happens. Remove soap buildup in the tub or shower regularly. Attach bath mats securely with double-sided non-slip rug tape. Do not have throw rugs and other things on the floor that can make you trip. What can I do in the bedroom? Use night  lights. Make sure that you have a light by your bed that is easy to reach. Do not use any sheets or blankets that are too big for your bed. They should not hang down onto the floor. Have a firm chair that has side arms. You can use this for support while you get dressed. Do not have throw rugs and other things on the floor that can make you trip. What can I do in the kitchen? Clean up any spills right away. Avoid walking on wet floors. Keep items that you use a lot in easy-to-reach places. If you need to reach something above you, use a strong step stool that has a grab bar. Keep electrical cords out of the way. Do not use floor polish or wax that makes floors slippery. If you must use wax, use non-skid floor wax. Do not have throw rugs and other things on the floor that can make you trip. What can I do with my stairs? Do not leave any items on the stairs. Make sure that there are handrails on both sides of the stairs and use them. Fix handrails that are broken or loose. Make sure that handrails are as long as the stairways. Check any carpeting to make sure that it is firmly attached to the stairs. Fix any carpet that is loose or worn. Avoid having throw rugs at the top or  bottom of the stairs. If you do have throw rugs, attach them to the floor with carpet tape. Make sure that you have a light switch at the top of the stairs and the bottom of the stairs. If you do not have them, ask someone to add them for you. What else can I do to help prevent falls? Wear shoes that: Do not have high heels. Have rubber bottoms. Are comfortable and fit you well. Are closed at the toe. Do not wear sandals. If you use a stepladder: Make sure that it is fully opened. Do not climb a closed stepladder. Make sure that both sides of the stepladder are locked into place. Ask someone to hold it for you, if possible. Clearly mark and make sure that you can see: Any grab bars or handrails. First and last  steps. Where the edge of each step is. Use tools that help you move around (mobility aids) if they are needed. These include: Canes. Walkers. Scooters. Crutches. Turn on the lights when you go into a dark area. Replace any light bulbs as soon as they burn out. Set up your furniture so you have a clear path. Avoid moving your furniture around. If any of your floors are uneven, fix them. If there are any pets around you, be aware of where they are. Review your medicines with your doctor. Some medicines can make you feel dizzy. This can increase your chance of falling. Ask your doctor what other things that you can do to help prevent falls. This information is not intended to replace advice given to you by your health care provider. Make sure you discuss any questions you have with your health care provider. Document Released: 08/15/2009 Document Revised: 03/26/2016 Document Reviewed: 11/23/2014 Elsevier Interactive Patient Education  2017 [employer].

## 2022-01-19 DIAGNOSIS — H353211 Exudative age-related macular degeneration, right eye, with active choroidal neovascularization: Secondary | ICD-10-CM | POA: Diagnosis not present

## 2022-01-19 DIAGNOSIS — H43812 Vitreous degeneration, left eye: Secondary | ICD-10-CM | POA: Diagnosis not present

## 2022-01-19 DIAGNOSIS — H353122 Nonexudative age-related macular degeneration, left eye, intermediate dry stage: Secondary | ICD-10-CM | POA: Diagnosis not present

## 2022-02-16 DIAGNOSIS — H353122 Nonexudative age-related macular degeneration, left eye, intermediate dry stage: Secondary | ICD-10-CM | POA: Diagnosis not present

## 2022-02-16 DIAGNOSIS — H43812 Vitreous degeneration, left eye: Secondary | ICD-10-CM | POA: Diagnosis not present

## 2022-02-16 DIAGNOSIS — H353211 Exudative age-related macular degeneration, right eye, with active choroidal neovascularization: Secondary | ICD-10-CM | POA: Diagnosis not present

## 2022-03-16 DIAGNOSIS — H353211 Exudative age-related macular degeneration, right eye, with active choroidal neovascularization: Secondary | ICD-10-CM | POA: Diagnosis not present

## 2022-03-16 DIAGNOSIS — H353122 Nonexudative age-related macular degeneration, left eye, intermediate dry stage: Secondary | ICD-10-CM | POA: Diagnosis not present

## 2022-04-13 DIAGNOSIS — H353211 Exudative age-related macular degeneration, right eye, with active choroidal neovascularization: Secondary | ICD-10-CM | POA: Diagnosis not present

## 2022-04-13 DIAGNOSIS — H35371 Puckering of macula, right eye: Secondary | ICD-10-CM | POA: Diagnosis not present

## 2022-05-11 DIAGNOSIS — H35373 Puckering of macula, bilateral: Secondary | ICD-10-CM | POA: Diagnosis not present

## 2022-05-11 DIAGNOSIS — H353122 Nonexudative age-related macular degeneration, left eye, intermediate dry stage: Secondary | ICD-10-CM | POA: Diagnosis not present

## 2022-05-11 DIAGNOSIS — H353211 Exudative age-related macular degeneration, right eye, with active choroidal neovascularization: Secondary | ICD-10-CM | POA: Diagnosis not present

## 2022-05-29 ENCOUNTER — Ambulatory Visit (INDEPENDENT_AMBULATORY_CARE_PROVIDER_SITE_OTHER): Payer: Medicare PPO | Admitting: Family Medicine

## 2022-05-29 ENCOUNTER — Encounter: Payer: Self-pay | Admitting: Family Medicine

## 2022-05-29 VITALS — BP 136/86 | HR 84 | Temp 97.6°F | Resp 18 | Ht 62.0 in | Wt 120.4 lb

## 2022-05-29 DIAGNOSIS — K219 Gastro-esophageal reflux disease without esophagitis: Secondary | ICD-10-CM | POA: Diagnosis not present

## 2022-05-29 DIAGNOSIS — E119 Type 2 diabetes mellitus without complications: Secondary | ICD-10-CM

## 2022-05-29 DIAGNOSIS — E785 Hyperlipidemia, unspecified: Secondary | ICD-10-CM

## 2022-05-29 DIAGNOSIS — C159 Malignant neoplasm of esophagus, unspecified: Secondary | ICD-10-CM | POA: Diagnosis not present

## 2022-05-29 DIAGNOSIS — M810 Age-related osteoporosis without current pathological fracture: Secondary | ICD-10-CM

## 2022-05-29 DIAGNOSIS — E1169 Type 2 diabetes mellitus with other specified complication: Secondary | ICD-10-CM | POA: Diagnosis not present

## 2022-05-29 DIAGNOSIS — Z0000 Encounter for general adult medical examination without abnormal findings: Secondary | ICD-10-CM | POA: Diagnosis not present

## 2022-05-29 LAB — COMPREHENSIVE METABOLIC PANEL
ALT: 17 U/L (ref 0–35)
AST: 23 U/L (ref 0–37)
Albumin: 4.4 g/dL (ref 3.5–5.2)
Alkaline Phosphatase: 51 U/L (ref 39–117)
BUN: 19 mg/dL (ref 6–23)
CO2: 31 mEq/L (ref 19–32)
Calcium: 10.3 mg/dL (ref 8.4–10.5)
Chloride: 102 mEq/L (ref 96–112)
Creatinine, Ser: 1.07 mg/dL (ref 0.40–1.20)
GFR: 50.06 mL/min — ABNORMAL LOW (ref >60.00)
Glucose, Bld: 128 mg/dL — ABNORMAL HIGH (ref 70–99)
Potassium: 4.4 mEq/L (ref 3.5–5.1)
Sodium: 140 mEq/L (ref 135–145)
Total Bilirubin: 0.7 mg/dL (ref 0.2–1.2)
Total Protein: 7.6 g/dL (ref 6.0–8.3)

## 2022-05-29 LAB — LIPID PANEL
Cholesterol: 204 mg/dL — ABNORMAL HIGH (ref 0–200)
HDL: 86.4 mg/dL (ref >39.00)
LDL Cholesterol: 93 mg/dL (ref 0–99)
NonHDL: 117.77
Total CHOL/HDL Ratio: 2
Triglycerides: 122 mg/dL (ref 0.0–149.0)
VLDL: 24.4 mg/dL (ref 0.0–40.0)

## 2022-05-29 LAB — CBC WITH DIFFERENTIAL/PLATELET
Basophils Absolute: 0 10*3/uL (ref 0.0–0.1)
Basophils Relative: 0.3 % (ref 0.0–3.0)
Eosinophils Absolute: 0.2 10*3/uL (ref 0.0–0.7)
Eosinophils Relative: 3 % (ref 0.0–5.0)
HCT: 38.7 % (ref 36.0–46.0)
Hemoglobin: 12.8 g/dL (ref 12.0–15.0)
Lymphocytes Relative: 19.2 % (ref 12.0–46.0)
Lymphs Abs: 1.5 10*3/uL (ref 0.7–4.0)
MCHC: 33.1 g/dL (ref 30.0–36.0)
MCV: 91.7 fl (ref 78.0–100.0)
Monocytes Absolute: 0.8 10*3/uL (ref 0.1–1.0)
Monocytes Relative: 10.5 % (ref 3.0–12.0)
Neutro Abs: 5.4 10*3/uL (ref 1.4–7.7)
Neutrophils Relative %: 67 % (ref 43.0–77.0)
Platelets: 202 10*3/uL (ref 150.0–400.0)
RBC: 4.22 Mil/uL (ref 3.87–5.11)
RDW: 14.5 % (ref 11.5–15.5)
WBC: 8.1 10*3/uL (ref 4.0–10.5)

## 2022-05-29 LAB — HEMOGLOBIN A1C: Hgb A1c MFr Bld: 7.1 % — ABNORMAL HIGH (ref 4.6–6.5)

## 2022-05-29 LAB — MICROALBUMIN / CREATININE URINE RATIO
Creatinine,U: 81.7 mg/dL
Microalb Creat Ratio: 1.1 mg/g (ref 0.0–30.0)
Microalb, Ur: 0.9 mg/dL (ref 0.0–1.9)

## 2022-05-29 MED ORDER — SIMVASTATIN 20 MG PO TABS: ORAL_TABLET | ORAL | 3 refills | Status: DC

## 2022-05-29 MED ORDER — PANTOPRAZOLE SODIUM 40 MG PO TBEC: DELAYED_RELEASE_TABLET | ORAL | 3 refills | Status: DC

## 2022-05-29 NOTE — Assessment & Plan Note (Signed)
ghm utd Check labs  See avs  

## 2022-05-29 NOTE — Assessment & Plan Note (Signed)
stable °

## 2022-05-29 NOTE — Assessment & Plan Note (Signed)
Encourage heart healthy diet such as MIND or DASH diet, increase exercise, avoid trans fats, simple carbohydrates and processed foods, consider a krill or fish or flaxseed oil cap daily.  °

## 2022-05-29 NOTE — Assessment & Plan Note (Signed)
Check labs  con't diet

## 2022-05-29 NOTE — Progress Notes (Signed)
Subjective:   By signing my name below, I, Luiz Ochoa, attest that this documentation has been prepared under the direction and in the presence of Ann Held, DO 05/29/2022     Patient ID: Briana Craig, female    DOB: 27-Oct-1946, 76 y.o.   MRN: [id number]  Chief Complaint  Patient presents with   Annual Exam    Pt states fasting     HPI Patient is in today for follow up.  Briana Craig:  Her right eye changed from dry macular to wet macular, so she recieves an injection each month. She believes this might change. Her eye has improved prior to the visit and describes that certain colors are different, but  she expereinces "less wavy lines" in her vision. She reports she recieved her last Covid-19 vaccine in 2023-08-25 or october of last year. She is interested in having the latest Covid-19 vaccine for patients over 65 years.   She denies having any fever, new moles, congestion, sinus pain, sore throat, chest pain, palpitations, cough, shortness of breath, wheezing, nausea, vomiting, diarrhea, constipation, dysuria, frequency, abdominal pain, hematuria, new muscle pain, headaches.  She has devloped pain in her wrists, and adds that she had surgery on her left wrist. She manages her pain with Tylenol.   She admits that her A1C may be high due to her eating more sweets. This occured after her husband passed. She normally eats breakfast in the morning, and snacks on popcorn.    She regularly sees the ophthalmologist every 6 months. She also sees the dentist regularly. She recently had to have a tooth repaired, and is currently using Invisiline.   Her husband passed away 08/24/2021, and says that his health had been declining after recieving his pacemaker. She has lost many family members, but is currently living with her cousin. Her cousin does not have any medical complications at this time.   Past Medical History:  Diagnosis Date   Breast cancer ([place]) 12/2018    right DCIS   Dysphagia    Esophageal cancer (HCC)    GERD (gastroesophageal reflux disease)    Hyperlipemia    Hypertension    no meds now   Impaired fasting blood sugar    Personal history of radiation therapy    PONV (postoperative nausea and vomiting)    Pre-diabetes     Past Surgical History:  Procedure Laterality Date   BREAST BIOPSY Right 12/23/2018   BREAST LUMPECTOMY Right 01/18/2019   BREAST LUMPECTOMY WITH RADIOACTIVE SEED LOCALIZATION Right 01/18/2019   Procedure: RIGHT BREAST CENTRAL LUMPECTOMY WITH RADIOACTIVE SEED LOCALIZATION;  Surgeon: Rolm Bookbinder, MD;  Location: [facility];  Service: General;  Laterality: Right;   DIRECT LARYNGOSCOPY WITH BOTOX INJECTION     tx paralysed vocal cord   ESOPHAGOGASTRODUODENOSCOPY  11/17/2011   Procedure: ESOPHAGOGASTRODUODENOSCOPY (EGD);  Surgeon: Owens Loffler, MD;  Location: [facility];  Service: Endoscopy;  Laterality: N/A;   ESOPHAGUS SURGERY     EYE SURGERY     both cataracts   GANGLION CYST EXCISION Left 01/26/2013   Procedure: LEFT FLEXOR CARPI RADIALIS  RELEASE AND SCAPHO TRAPEZIAL TRAPEZOID DEBRIDEMENT ;  Surgeon: Tennis Must, MD;  Location: [facility];  Service: Orthopedics;  Laterality: Left;    Family History  Problem Relation Age of Onset   Alcohol abuse Sister    AAA (abdominal aortic aneurysm) Mother    COPD Father    Colon cancer Neg Hx  Social History   Socioeconomic History   Marital status: Widowed    Spouse name: Sonia Side   Number of children: 0   Years of education: 18   Highest education level: Not on file  Occupational History   Occupation: RETIRED     Employer: OTHER  Tobacco Use   Smoking status: Former    Types: Cigarettes    Quit date: 08/30/1987    Years since quitting: 34.7   Smokeless tobacco: Never  Substance and Sexual Activity   Alcohol use: No   Drug use: Never   Sexual activity: Yes    Partners: Male    Birth control/protection:  Post-menopausal  Other Topics Concern   Not on file  Social History Narrative   Marital Status:  Married Sonia Side)   Children:  None    Pets: Dog (1)    Living Situation: Lives with husband.   Occupation:  Retired Visual merchandiser)    Education: Master's Degree    Tobacco Use/Exposure:  Formal Smoker    Alcohol Use:  Occasional   Drug Use:  None   Diet:  Regular   Exercise:  Walking 1 mile 5 times per week.    Hobbies:  Reading                Social Determinants of Health   Financial Resource Strain: Low Risk  (01/06/2022)   Overall Financial Resource Strain (CARDIA)    Difficulty of Paying Living Expenses: Not hard at all  Food Insecurity: No Food Insecurity (01/06/2022)   Hunger Vital Sign    Worried About Running Out of Food in the Last Year: Never true    Ran Out of Food in the Last Year: Never true  Transportation Needs: No Transportation Needs (01/06/2022)   PRAPARE - Hydrologist (Medical): No    Lack of Transportation (Non-Medical): No  Physical Activity: Sufficiently Active (01/06/2022)   Exercise Vital Sign    Days of Exercise per Week: 7 days    Minutes of Exercise per Session: 60 min  Stress: No Stress Concern Present (12/19/2020)   [place]    Feeling of Stress : Not at all  Social Connections: Socially Isolated (01/06/2022)   Social Connection and Isolation Panel [NHANES]    Frequency of Communication with Friends and Family: More than three times a week    Frequency of Social Gatherings with Friends and Family: More than three times a week    Attends Religious Services: Never    Marine scientist or Organizations: No    Attends Archivist Meetings: Never    Marital Status: Widowed  Intimate Partner Violence: Not At Risk (12/19/2020)   Humiliation, Afraid, Rape, and Kick questionnaire    Fear of Current or Ex-Partner: No    Emotionally Abused: No    Physically  Abused: No    Sexually Abused: No    Outpatient Medications Prior to Visit  Medication Sig Dispense Refill   Accu-Chek Softclix Lancets lancets Use to check blood sugar once a day.  DX  E11.9 100 each 1   Alcohol Swabs (ALCOHOL WIPES) 70 % PADS To use to check sugars 100 each 1   Blood Glucose Calibration (ACCU-CHEK AVIVA) SOLN Use to check control when opening a new bottle of strips.  DX E11.9 1 each 1   Blood Glucose Monitoring Suppl (ACCU-CHEK AVIVA PLUS) w/Device KIT Use to check blood sugar once a day.  DX E11.9 1 kit 0   Blood Glucose Monitoring Suppl (TRUE METRIX AIR GLUCOSE METER) DEVI Use as directed to check glucose twice daily. E11.9 1 each 0   CALCIUM-VITAMIN D PO Take 600 mg by mouth daily.     cetirizine (ZYRTEC) 10 MG tablet Take 10 mg by mouth as needed for allergies.     famotidine (PEPCID) 40 MG tablet TAKE 1 TABLET AT BEDTIME 90 tablet 1   fluticasone (FLONASE) 50 MCG/ACT nasal spray Place 2 sprays into the nose daily as needed.      glucose blood (ACCU-CHEK AVIVA PLUS) test strip Use to check blood sugar once daily.  DX  E 11.9 100 strip 1   glucose blood (TRUE METRIX BLOOD GLUCOSE TEST) test strip Use as instructed 100 each 12   latanoprost (XALATAN) 0.005 % ophthalmic solution Place 1 drop into both eyes daily.     Multiple Vitamins-Minerals (MULTIVITAMIN PO) Take 1 tablet by mouth daily.     Multiple Vitamins-Minerals (PRESERVISION AREDS) CAPS Take by mouth.     Omega-3 Fatty Acids (FISH OIL PO) Take 1,200 mg by mouth daily.     TRUEplus Lancets 33G MISC Use as directed. Check glucose up to twice daily. E11.9 100 each 11   pantoprazole (PROTONIX) 40 MG tablet TAKE 1 TABLET EVERY DAY (NEED MD APPOINTMENT) 90 tablet 1   simvastatin (ZOCOR) 20 MG tablet TAKE 1 TABLET EVERY DAY AT 6 P.M. (NEED MD APPOINTMENT) 90 tablet 1   No facility-administered medications prior to visit.    Allergies  Allergen Reactions   Taxol [Paclitaxel] Hives and Itching   Tramadol      "Sweaty, shaky and faint"   Carboplatin Hives and Itching   Codeine    Compazine    Compazine  [Prochlorperazine Maleate] Other (See Comments)    Severe chest pain   Penicillins    Prochlorperazine Edisylate     Other reaction(s): Other (See Comments) Severe chest pain    Review of Systems  Constitutional:  Negative for fever and malaise/fatigue.  HENT:  Negative for congestion.   Eyes:  Negative for blurred vision.  Respiratory:  Negative for cough and shortness of breath.   Cardiovascular:  Negative for chest pain, palpitations and leg swelling.  Gastrointestinal:  Negative for vomiting.  Musculoskeletal:  Positive for joint pain (wrist pain). Negative for back pain.  Skin:  Negative for rash.  Neurological:  Negative for loss of consciousness and headaches.       Objective:    Physical Exam Vitals and nursing note reviewed.  Constitutional:      Appearance: She is well-developed.  HENT:     Head: Normocephalic and atraumatic.  Eyes:     Conjunctiva/sclera: Conjunctivae normal.  Neck:     Thyroid: No thyromegaly.     Vascular: No carotid bruit or JVD.  Cardiovascular:     Rate and Rhythm: Normal rate and regular rhythm.     Heart sounds: Normal heart sounds. No murmur heard. Pulmonary:     Effort: Pulmonary effort is normal. No respiratory distress.     Breath sounds: Normal breath sounds. No wheezing or rales.  Chest:     Chest wall: No tenderness.  Abdominal:     General: There is no distension.     Palpations: Abdomen is soft.     Tenderness: There is no abdominal tenderness. There is no guarding or rebound.  Genitourinary:    Exam position: Supine.     Labia:  Right: No rash, tenderness, lesion or injury.        Left: No rash, tenderness, lesion or injury.      Vagina: No vaginal discharge, erythema or tenderness.  Musculoskeletal:     Cervical back: Normal range of motion and neck supple.  Neurological:     Mental Status: She is alert and  oriented to person, place, and time.     BP 136/86 (BP Location: Left Arm, Patient Position: Sitting, Cuff Size: Normal)   Pulse 84   Temp 97.6 F (36.4 C) (Oral)   Resp 18   Ht 5' 2"  (1.575 m)   Wt 120 lb 6.4 oz (54.6 kg)   SpO2 98%   BMI 22.02 kg/m  Wt Readings from Last 3 Encounters:  05/29/22 120 lb 6.4 oz (54.6 kg)  11/27/21 122 lb 3.2 oz (55.4 kg)  05/27/21 121 lb 12.8 oz (55.2 kg)    Diabetic Foot Exam - Simple   No data filed    Lab Results  Component Value Date   WBC 5.4 05/20/2021   HGB 12.6 05/20/2021   HCT 37.2 05/20/2021   PLT 198.0 05/20/2021   GLUCOSE 126 (H) 11/27/2021   CHOL 195 11/27/2021   TRIG 127.0 11/27/2021   HDL 89.30 11/27/2021   LDLCALC 81 11/27/2021   ALT 17 11/27/2021   AST 23 11/27/2021   NA 139 11/27/2021   K 4.2 11/27/2021   CL 101 11/27/2021   CREATININE 1.09 11/27/2021   BUN 21 11/27/2021   CO2 29 11/27/2021   TSH 2.147 04/24/2013   HGBA1C 7.0 (H) 11/27/2021   MICROALBUR 1.3 11/27/2021    Lab Results  Component Value Date   TSH 2.147 04/24/2013   Lab Results  Component Value Date   WBC 5.4 05/20/2021   HGB 12.6 05/20/2021   HCT 37.2 05/20/2021   MCV 91.1 05/20/2021   PLT 198.0 05/20/2021   Lab Results  Component Value Date   NA 139 11/27/2021   K 4.2 11/27/2021   CO2 29 11/27/2021   GLUCOSE 126 (H) 11/27/2021   BUN 21 11/27/2021   CREATININE 1.09 11/27/2021   BILITOT 0.7 11/27/2021   ALKPHOS 53 11/27/2021   AST 23 11/27/2021   ALT 17 11/27/2021   PROT 7.6 11/27/2021   ALBUMIN 4.5 11/27/2021   CALCIUM 9.9 11/27/2021   GFR 49.13 (L) 11/27/2021   Lab Results  Component Value Date   CHOL 195 11/27/2021   Lab Results  Component Value Date   HDL 89.30 11/27/2021   Lab Results  Component Value Date   LDLCALC 81 11/27/2021   Lab Results  Component Value Date   TRIG 127.0 11/27/2021   Lab Results  Component Value Date   CHOLHDL 2 11/27/2021   Lab Results  Component Value Date   HGBA1C 7.0 (H)  11/27/2021       Assessment & Plan:   Problem List Items Addressed This Visit       Unprioritized   Esophageal cancer ([place])   Relevant Medications   pantoprazole (PROTONIX) 40 MG tablet   Hyperlipidemia associated with type 2 diabetes mellitus ([place])   Relevant Medications   simvastatin (ZOCOR) 20 MG tablet   Other Relevant Orders   CBC with Differential/Platelet   Comprehensive metabolic panel   Hemoglobin A1c   Lipid panel   Microalbumin / creatinine urine ratio   Preventative health care    ghm utd Check labs  See avs       Relevant Orders  CBC with Differential/Platelet   Comprehensive metabolic panel   Hemoglobin A1c   Lipid panel   Microalbumin / creatinine urine ratio   Hyperlipidemia    .Encourage heart healthy diet such as MIND or DASH diet, increase exercise, avoid trans fats, simple carbohydrates and processed foods, consider a krill or fish or flaxseed oil cap daily.       Relevant Medications   simvastatin (ZOCOR) 20 MG tablet   GERD (gastroesophageal reflux disease)    stable      Relevant Medications   pantoprazole (PROTONIX) 40 MG tablet   Diet-controlled diabetes mellitus ([facility]CC) - Primary    Check labs  con't diet      Relevant Medications   simvastatin (ZOCOR) 20 MG tablet   Other Relevant Orders   CBC with Differential/Platelet   Comprehensive metabolic panel   Hemoglobin A1c   Lipid panel   Microalbumin / creatinine urine ratio   Age-related osteoporosis without current pathological fracture    con't calcium and vita d Weight bearing exercise         Meds ordered this encounter  Medications   pantoprazole (PROTONIX) 40 MG tablet    Sig: TAKE 1 TABLET EVERY DAY (NEED MD APPOINTMENT)    Dispense:  90 tablet    Refill:  3   simvastatin (ZOCOR) 20 MG tablet    Sig: TAKE 1 TABLET EVERY DAY AT 6 P.M. (NEED MD APPOINTMENT)    Dispense:  90 tablet    Refill:  3    I, Ann Held, DO, personally preformed the services  described in this documentation.  All medical record entries made by the scribe were at my direction and in my presence.  I have reviewed the chart and discharge instructions (if applicable) and agree that the record reflects my personal performance and is accurate and complete. 05/29/2022   I,Tinashe Williams,acting as a scribe for Ann Held, DO.,have documented all relevant documentation on the behalf of Ann Held, DO,as directed by  Ann Held, DO while in the presence of Ann Held, DO.    Ann Held, DO

## 2022-05-29 NOTE — Progress Notes (Signed)
Subjective:   By signing my name below, I, Luiz Ochoa, attest that this documentation has been prepared under the direction and in the presence of Ann Held, DO 05/29/2022   Patient ID: Briana Craig, female    DOB: [birth year], 76 y.o.   MRN: [id number]  No chief complaint on file.   HPI Patient is in today for a comprehensive physical.  She denies having any fever, new moles, congestion, sinus pain, sore throat, chest pain, palpitations, cough, shortness of breath, wheezing, nausea, vomiting, diarrhea, constipation, dysuria, frequency, abdominal pain, hematuria, new muscle pain, new joint pain, headaches.   Past Medical History:  Diagnosis Date   Breast cancer ([place]) 12/2018   right DCIS   Dysphagia    Esophageal cancer ([facility])    GERD (gastroesophageal reflux disease)    Hyperlipemia    Hypertension    no meds now   Impaired fasting blood sugar    Personal history of radiation therapy    PONV (postoperative nausea and vomiting)    Pre-diabetes     Past Surgical History:  Procedure Laterality Date   BREAST BIOPSY Right 12/23/2018   BREAST LUMPECTOMY Right 01/18/2019   BREAST LUMPECTOMY WITH RADIOACTIVE SEED LOCALIZATION Right 01/18/2019   Procedure: RIGHT BREAST CENTRAL LUMPECTOMY WITH RADIOACTIVE SEED LOCALIZATION;  Surgeon: Rolm Bookbinder, MD;  Location: [facility];  Service: General;  Laterality: Right;   DIRECT LARYNGOSCOPY WITH BOTOX INJECTION     tx paralysed vocal cord   ESOPHAGOGASTRODUODENOSCOPY  11/17/2011   Procedure: ESOPHAGOGASTRODUODENOSCOPY (EGD);  Surgeon: Owens Loffler, MD;  Location: [facility];  Service: Endoscopy;  Laterality: N/A;   ESOPHAGUS SURGERY     EYE SURGERY     both cataracts   GANGLION CYST EXCISION Left 01/26/2013   Procedure: LEFT FLEXOR CARPI RADIALIS  RELEASE AND SCAPHO TRAPEZIAL TRAPEZOID DEBRIDEMENT ;  Surgeon: Tennis Must, MD;  Location: [facility];  Service: Orthopedics;   Laterality: Left;    Family History  Problem Relation Age of Onset   Alcohol abuse Sister    AAA (abdominal aortic aneurysm) Mother    COPD Father    Colon cancer Neg Hx     Social History   Socioeconomic History   Marital status: Widowed    Spouse name: Sonia Side   Number of children: 0   Years of education: 18   Highest education level: Not on file  Occupational History   Occupation: RETIRED     Employer: OTHER  Tobacco Use   Smoking status: Former    Types: Cigarettes    Quit date: 08/30/1987    Years since quitting: 34.7   Smokeless tobacco: Never  Substance and Sexual Activity   Alcohol use: No   Drug use: Never   Sexual activity: Yes    Partners: Male    Birth control/protection: Post-menopausal  Other Topics Concern   Not on file  Social History Narrative   Marital Status:  Married Sonia Side)   Children:  None    Pets: Dog (1)    Living Situation: Lives with husband.   Occupation:  Retired Visual merchandiser)    Education: Master's Degree    Tobacco Use/Exposure:  Formal Smoker    Alcohol Use:  Occasional   Drug Use:  None   Diet:  Regular   Exercise:  Walking 1 mile 5 times per week.    Hobbies:  Reading                Social  Determinants of Health   Financial Resource Strain: Low Risk  (01/06/2022)   Overall Financial Resource Strain (CARDIA)    Difficulty of Paying Living Expenses: Not hard at all  Food Insecurity: No Food Insecurity (01/06/2022)   Hunger Vital Sign    Worried About Running Out of Food in the Last Year: Never true    Ran Out of Food in the Last Year: Never true  Transportation Needs: No Transportation Needs (01/06/2022)   PRAPARE - Hydrologist (Medical): No    Lack of Transportation (Non-Medical): No  Physical Activity: Sufficiently Active (01/06/2022)   Exercise Vital Sign    Days of Exercise per Week: 7 days    Minutes of Exercise per Session: 60 min  Stress: No Stress Concern Present (12/19/2020)   [place]    Feeling of Stress : Not at all  Social Connections: Socially Isolated (01/06/2022)   Social Connection and Isolation Panel [NHANES]    Frequency of Communication with Friends and Family: More than three times a week    Frequency of Social Gatherings with Friends and Family: More than three times a week    Attends Religious Services: Never    Marine scientist or Organizations: No    Attends Archivist Meetings: Never    Marital Status: Widowed  Intimate Partner Violence: Not At Risk (12/19/2020)   Humiliation, Afraid, Rape, and Kick questionnaire    Fear of Current or Ex-Partner: No    Emotionally Abused: No    Physically Abused: No    Sexually Abused: No    Outpatient Medications Prior to Visit  Medication Sig Dispense Refill   Accu-Chek Softclix Lancets lancets Use to check blood sugar once a day.  DX  E11.9 100 each 1   Alcohol Swabs (ALCOHOL WIPES) 70 % PADS To use to check sugars 100 each 1   Blood Glucose Calibration (ACCU-CHEK AVIVA) SOLN Use to check control when opening a new bottle of strips.  DX E11.9 1 each 1   Blood Glucose Monitoring Suppl (ACCU-CHEK AVIVA PLUS) w/Device KIT Use to check blood sugar once a day.  DX E11.9 1 kit 0   Blood Glucose Monitoring Suppl (TRUE METRIX AIR GLUCOSE METER) DEVI Use as directed to check glucose twice daily. E11.9 1 each 0   CALCIUM-VITAMIN D PO Take 600 mg by mouth daily.     cetirizine (ZYRTEC) 10 MG tablet Take 10 mg by mouth as needed for allergies.     famotidine (PEPCID) 40 MG tablet TAKE 1 TABLET AT BEDTIME 90 tablet 1   fluticasone (FLONASE) 50 MCG/ACT nasal spray Place 2 sprays into the nose daily as needed.      glucose blood (ACCU-CHEK AVIVA PLUS) test strip Use to check blood sugar once daily.  DX  E 11.9 100 strip 1   glucose blood (TRUE METRIX BLOOD GLUCOSE TEST) test strip Use as instructed 100 each 12   latanoprost (XALATAN) 0.005 %  ophthalmic solution Place 1 drop into both eyes daily.     Multiple Vitamins-Minerals (MULTIVITAMIN PO) Take 1 tablet by mouth daily.     Multiple Vitamins-Minerals (PRESERVISION AREDS) CAPS Take by mouth.     Omega-3 Fatty Acids (FISH OIL PO) Take 1,200 mg by mouth daily.     pantoprazole (PROTONIX) 40 MG tablet TAKE 1 TABLET EVERY DAY (NEED MD APPOINTMENT) 90 tablet 1   simvastatin (ZOCOR) 20 MG tablet TAKE 1  TABLET EVERY DAY AT 6 P.M. (NEED MD APPOINTMENT) 90 tablet 1   TRUEplus Lancets 33G MISC Use as directed. Check glucose up to twice daily. E11.9 100 each 11   No facility-administered medications prior to visit.    Allergies  Allergen Reactions   Taxol [Paclitaxel] Hives and Itching   Tramadol     "Sweaty, shaky and faint"   Carboplatin Hives and Itching   Codeine    Compazine    Compazine  [Prochlorperazine Maleate] Other (See Comments)    Severe chest pain   Penicillins    Prochlorperazine Edisylate     Other reaction(s): Other (See Comments) Severe chest pain    Review of Systems  Constitutional:  Negative for fever.  HENT:  Negative for congestion, sinus pain and sore throat.   Respiratory:  Negative for cough, shortness of breath and wheezing.   Cardiovascular:  Negative for chest pain and palpitations.  Gastrointestinal:  Negative for abdominal pain, constipation, diarrhea, nausea and vomiting.  Genitourinary:  Negative for dysuria, frequency and hematuria.  Musculoskeletal:  Negative for joint pain and myalgias.  Skin:        (-) New Moles  Neurological:  Negative for headaches.       Objective:    Physical Exam Constitutional:      Appearance: Normal appearance. She is not ill-appearing.  HENT:     Head: Normocephalic and atraumatic.     Right Ear: Tympanic membrane, ear canal and external ear normal.     Left Ear: Tympanic membrane, ear canal and external ear normal.  Eyes:     Extraocular Movements: Extraocular movements intact.     Pupils: Pupils  are equal, round, and reactive to light.  Cardiovascular:     Rate and Rhythm: Normal rate and regular rhythm.     Pulses: Normal pulses.     Heart sounds: Normal heart sounds. No murmur heard.    No gallop.  Pulmonary:     Effort: Pulmonary effort is normal. No respiratory distress.     Breath sounds: Normal breath sounds. No wheezing or rales.  Abdominal:     General: Bowel sounds are normal. There is no distension.     Palpations: Abdomen is soft.     Tenderness: There is no abdominal tenderness. There is no guarding.  Skin:    General: Skin is warm and dry.  Neurological:     Mental Status: She is alert and oriented to person, place, and time.  Psychiatric:        Judgment: Judgment normal.     There were no vitals taken for this visit. Wt Readings from Last 3 Encounters:  11/27/21 122 lb 3.2 oz (55.4 kg)  05/27/21 121 lb 12.8 oz (55.2 kg)  03/25/21 118 lb (53.5 kg)    Diabetic Foot Exam - Simple   No data filed    Lab Results  Component Value Date   WBC 5.4 05/20/2021   HGB 12.6 05/20/2021   HCT 37.2 05/20/2021   PLT 198.0 05/20/2021   GLUCOSE 126 (H) 11/27/2021   CHOL 195 11/27/2021   TRIG 127.0 11/27/2021   HDL 89.30 11/27/2021   LDLCALC 81 11/27/2021   ALT 17 11/27/2021   AST 23 11/27/2021   NA 139 11/27/2021   K 4.2 11/27/2021   CL 101 11/27/2021   CREATININE 1.09 11/27/2021   BUN 21 11/27/2021   CO2 29 11/27/2021   TSH 2.147 04/24/2013   HGBA1C 7.0 (H) 11/27/2021   MICROALBUR 1.3  11/27/2021    Lab Results  Component Value Date   TSH 2.147 04/24/2013   Lab Results  Component Value Date   WBC 5.4 05/20/2021   HGB 12.6 05/20/2021   HCT 37.2 05/20/2021   MCV 91.1 05/20/2021   PLT 198.0 05/20/2021   Lab Results  Component Value Date   NA 139 11/27/2021   K 4.2 11/27/2021   CO2 29 11/27/2021   GLUCOSE 126 (H) 11/27/2021   BUN 21 11/27/2021   CREATININE 1.09 11/27/2021   BILITOT 0.7 11/27/2021   ALKPHOS 53 11/27/2021   AST 23  11/27/2021   ALT 17 11/27/2021   PROT 7.6 11/27/2021   ALBUMIN 4.5 11/27/2021   CALCIUM 9.9 11/27/2021   GFR 49.13 (L) 11/27/2021   Lab Results  Component Value Date   CHOL 195 11/27/2021   Lab Results  Component Value Date   HDL 89.30 11/27/2021   Lab Results  Component Value Date   LDLCALC 81 11/27/2021   Lab Results  Component Value Date   TRIG 127.0 11/27/2021   Lab Results  Component Value Date   CHOLHDL 2 11/27/2021   Lab Results  Component Value Date   HGBA1C 7.0 (H) 11/27/2021        Mammogram- Last completed 03/18/2021. Results: IMPRESSION: 1. Probably benign asymmetry in the posterior central left breast in the craniocaudal projection, not included on previous examinations. This most likely represents normal fibroglandular tissue seen at ultrasound. 2. No evidence of malignancy elsewhere in either breast.  Follow up in 6 months.   Colonoscopy- Last completed 01/05/2011.   Bone Density- Last completed 07/17/2021. Results:  Assessment: The BMD measured at Femur Neck Right is 0.692 g/cm2 with a T-score of -2.5. This patient is considered osteoporotic according to [facility]) criteria. Compared with the prior study on, the BMD of the total mean shows no statistically signficant change. L-4 was excluded due to degenerative changes. The scan quality is good.  Follow up in 2 years.  Assessment & Plan:   Problem List Items Addressed This Visit   None    No orders of the defined types were placed in this encounter.   I, Luiz Ochoa, personally preformed the services described in this documentation.  All medical record entries made by the scribe were at my direction and in my presence.  I have reviewed the chart and discharge instructions (if applicable) and agree that the record reflects my personal performance and is accurate and complete. 05/29/2022   I,Tinashe Williams,acting as a scribe for Ann Held, DO.,have  documented all relevant documentation on the behalf of Ann Held, DO,as directed by  Ann Held, DO while in the presence of Ann Held, DO.    Luiz Ochoa

## 2022-05-29 NOTE — Assessment & Plan Note (Signed)
con't calcium and vita d Weight bearing exercise

## 2022-05-29 NOTE — Patient Instructions (Signed)
Preventive Care 65 Years and Older, Female Preventive care refers to lifestyle choices and visits with your health care provider that can promote health and wellness. Preventive care visits are also called wellness exams. What can I expect for my preventive care visit? Counseling Your health care provider may ask you questions about your: Medical history, including: Past medical problems. Family medical history. Pregnancy and menstrual history. History of falls. Current health, including: Memory and ability to understand (cognition). Emotional well-being. Home life and relationship well-being. Sexual activity and sexual health. Lifestyle, including: Alcohol, nicotine or tobacco, and drug use. Access to firearms. Diet, exercise, and sleep habits. Work and work environment. Sunscreen use. Safety issues such as seatbelt and bike helmet use. Physical exam Your health care provider will check your: Height and weight. These may be used to calculate your BMI (body mass index). BMI is a measurement that tells if you are at a healthy weight. Waist circumference. This measures the distance around your waistline. This measurement also tells if you are at a healthy weight and may help predict your risk of certain diseases, such as type 2 diabetes and high blood pressure. Heart rate and blood pressure. Body temperature. Skin for abnormal spots. What immunizations do I need?  Vaccines are usually given at various ages, according to a schedule. Your health care provider will recommend vaccines for you based on your age, medical history, and lifestyle or other factors, such as travel or where you work. What tests do I need? Screening Your health care provider may recommend screening tests for certain conditions. This may include: Lipid and cholesterol levels. Hepatitis C test. Hepatitis B test. HIV (human immunodeficiency virus) test. STI (sexually transmitted infection) testing, if you are at  risk. Lung cancer screening. Colorectal cancer screening. Diabetes screening. This is done by checking your blood sugar (glucose) after you have not eaten for a while (fasting). Mammogram. Talk with your health care provider about how often you should have regular mammograms. BRCA-related cancer screening. This may be done if you have a family history of breast, ovarian, tubal, or peritoneal cancers. Bone density scan. This is done to screen for osteoporosis. Talk with your health care provider about your test results, treatment options, and if necessary, the need for more tests. Follow these instructions at home: Eating and drinking  Eat a diet that includes fresh fruits and vegetables, whole grains, lean protein, and low-fat dairy products. Limit your intake of foods with high amounts of sugar, saturated fats, and salt. Take vitamin and mineral supplements as recommended by your health care provider. Do not drink alcohol if your health care provider tells you not to drink. If you drink alcohol: Limit how much you have to 0-1 drink a day. Know how much alcohol is in your drink. In the [place] one drink equals one 12 oz bottle of beer (355 mL), one 5 oz glass of wine (148 mL), or one 1 oz glass of hard liquor (44 mL). Lifestyle Brush your teeth every morning and night with fluoride toothpaste. Floss one time each day. Exercise for at least 30 minutes 5 or more days each week. Do not use any products that contain nicotine or tobacco. These products include cigarettes, chewing tobacco, and vaping devices, such as e-cigarettes. If you need help quitting, ask your health care provider. Do not use drugs. If you are sexually active, practice safe sex. Use a condom or other form of protection in order to prevent STIs. Take aspirin only as told by   your health care provider. Make sure that you understand how much to take and what form to take. Work with your health care provider to find out whether it  is safe and beneficial for you to take aspirin daily. Ask your health care provider if you need to take a cholesterol-lowering medicine (statin). Find healthy ways to manage stress, such as: Meditation, yoga, or listening to music. Journaling. Talking to a trusted person. Spending time with friends and family. Minimize exposure to UV radiation to reduce your risk of skin cancer. Safety Always wear your seat belt while driving or riding in a vehicle. Do not drive: If you have been drinking alcohol. Do not ride with someone who has been drinking. When you are tired or distracted. While texting. If you have been using any mind-altering substances or drugs. Wear a helmet and other protective equipment during sports activities. If you have firearms in your house, make sure you follow all gun safety procedures. What's next? Visit your health care provider once a year for an annual wellness visit. Ask your health care provider how often you should have your eyes and teeth checked. Stay up to date on all vaccines. This information is not intended to replace advice given to you by your health care provider. Make sure you discuss any questions you have with your health care provider. Document Revised: 04/16/2021 Document Reviewed: 04/16/2021 Elsevier Patient Education  2023 Elsevier Inc.  

## 2022-06-10 ENCOUNTER — Other Ambulatory Visit: Payer: Self-pay | Admitting: Family Medicine

## 2022-06-10 ENCOUNTER — Other Ambulatory Visit: Payer: Self-pay

## 2022-06-10 DIAGNOSIS — E1169 Type 2 diabetes mellitus with other specified complication: Secondary | ICD-10-CM

## 2022-06-10 DIAGNOSIS — E1165 Type 2 diabetes mellitus with hyperglycemia: Secondary | ICD-10-CM

## 2022-06-10 MED ORDER — ACCU-CHEK AVIVA PLUS W/DEVICE KIT: PACK | 0 refills | Status: AC

## 2022-06-22 DIAGNOSIS — H43813 Vitreous degeneration, bilateral: Secondary | ICD-10-CM | POA: Diagnosis not present

## 2022-06-22 DIAGNOSIS — H353122 Nonexudative age-related macular degeneration, left eye, intermediate dry stage: Secondary | ICD-10-CM | POA: Diagnosis not present

## 2022-06-22 DIAGNOSIS — H353211 Exudative age-related macular degeneration, right eye, with active choroidal neovascularization: Secondary | ICD-10-CM | POA: Diagnosis not present

## 2022-06-22 DIAGNOSIS — H35373 Puckering of macula, bilateral: Secondary | ICD-10-CM | POA: Diagnosis not present

## 2022-07-30 DIAGNOSIS — H401131 Primary open-angle glaucoma, bilateral, mild stage: Secondary | ICD-10-CM | POA: Diagnosis not present

## 2022-07-30 DIAGNOSIS — H353211 Exudative age-related macular degeneration, right eye, with active choroidal neovascularization: Secondary | ICD-10-CM | POA: Diagnosis not present

## 2022-07-30 DIAGNOSIS — H353122 Nonexudative age-related macular degeneration, left eye, intermediate dry stage: Secondary | ICD-10-CM | POA: Diagnosis not present

## 2022-08-05 ENCOUNTER — Other Ambulatory Visit: Payer: Self-pay | Admitting: Family Medicine

## 2022-08-05 DIAGNOSIS — K219 Gastro-esophageal reflux disease without esophagitis: Secondary | ICD-10-CM

## 2022-08-17 DIAGNOSIS — H35373 Puckering of macula, bilateral: Secondary | ICD-10-CM | POA: Diagnosis not present

## 2022-08-17 DIAGNOSIS — H353211 Exudative age-related macular degeneration, right eye, with active choroidal neovascularization: Secondary | ICD-10-CM | POA: Diagnosis not present

## 2022-08-17 DIAGNOSIS — H353122 Nonexudative age-related macular degeneration, left eye, intermediate dry stage: Secondary | ICD-10-CM | POA: Diagnosis not present

## 2022-08-18 ENCOUNTER — Other Ambulatory Visit (HOSPITAL_BASED_OUTPATIENT_CLINIC_OR_DEPARTMENT_OTHER): Payer: Self-pay

## 2022-08-18 MED ORDER — COMIRNATY 30 MCG/0.3ML IM SUSY
PREFILLED_SYRINGE | INTRAMUSCULAR | 0 refills | Status: DC
  Filled 2022-08-18: qty 0.3, 1d supply, fill #0

## 2022-08-18 MED ORDER — FLUAD QUADRIVALENT 0.5 ML IM PRSY
PREFILLED_SYRINGE | INTRAMUSCULAR | 0 refills | Status: DC
  Filled 2022-08-18: qty 0.5, 1d supply, fill #0

## 2022-09-02 ENCOUNTER — Telehealth: Payer: Self-pay | Admitting: *Deleted

## 2022-09-02 NOTE — Chronic Care Management (AMB) (Signed)
  Care Coordination   Note   09/02/2022 Name: Briana Craig MRN: [id number] DOB: Nov 14, 1945  Briana Craig is a 76 y.o. year old female who sees Carollee Herter, Alferd Apa, DO for primary care. I reached out to April Holding by phone today to offer care coordination services.  [name] was given information about Care Coordination services today including:   The Care Coordination services include support from the care team which includes your Nurse Coordinator, Clinical Social Worker, or Pharmacist.  The Care Coordination team is here to help remove barriers to the health concerns and goals most important to you. Care Coordination services are voluntary, and the patient may decline or stop services at any time by request to their care team member.   Care Coordination Consent Status: Patient agreed to services and verbal consent obtained.   Follow up plan:  Telephone appointment with care coordination team member scheduled for:  09/11/2022  Encounter Outcome:  Pt. Scheduled  Julian Hy, [place] Direct Dial: [phone]

## 2022-09-11 ENCOUNTER — Ambulatory Visit: Payer: Self-pay

## 2022-09-11 NOTE — Patient Outreach (Signed)
  Care Coordination   Initial Visit Note   09/11/2022 Name: Namine Beahm MRN: [id number] DOB: [birth year]  Chevella Jaleiah Asay is a 76 y.o. year old female who sees Carollee Herter, Alferd Apa, DO for primary care. I spoke with  April Holding by phone today.  What matters to the patients health and wellness today?  Ms. Asmus denies any care coordination or care coordination needs at this time.    Goals Addressed             This Visit's Progress    COMPLETED: Care Coordination Actvities       Care Coordination Interventions: Discussed care coordination program Encouraged patient to contact primary care provider if care coordination needs in the future       SDOH assessments and interventions completed:  Yes  SDOH Interventions Today    Flowsheet Row Most Recent Value  SDOH Interventions   Food Insecurity Interventions Intervention Not Indicated  Housing Interventions Intervention Not Indicated  Transportation Interventions Intervention Not Indicated  Utilities Interventions Intervention Not Indicated     Care Coordination Interventions Activated:  Yes  Care Coordination Interventions:  Yes, provided   Follow up plan: No further intervention required.   Encounter Outcome:  Pt. Visit Completed   Thea Silversmith, RN, MSN, BSN, CCM Care Coordinator [phone]

## 2022-10-20 DIAGNOSIS — H35373 Puckering of macula, bilateral: Secondary | ICD-10-CM | POA: Diagnosis not present

## 2022-10-20 DIAGNOSIS — H353211 Exudative age-related macular degeneration, right eye, with active choroidal neovascularization: Secondary | ICD-10-CM | POA: Diagnosis not present

## 2022-10-20 DIAGNOSIS — H353122 Nonexudative age-related macular degeneration, left eye, intermediate dry stage: Secondary | ICD-10-CM | POA: Diagnosis not present

## 2022-10-20 DIAGNOSIS — H43813 Vitreous degeneration, bilateral: Secondary | ICD-10-CM | POA: Diagnosis not present

## 2022-11-30 ENCOUNTER — Ambulatory Visit (INDEPENDENT_AMBULATORY_CARE_PROVIDER_SITE_OTHER): Payer: Medicare PPO | Admitting: Family Medicine

## 2022-11-30 ENCOUNTER — Other Ambulatory Visit: Payer: Self-pay | Admitting: Family Medicine

## 2022-11-30 ENCOUNTER — Encounter: Payer: Self-pay | Admitting: Family Medicine

## 2022-11-30 ENCOUNTER — Telehealth: Payer: Self-pay | Admitting: Family Medicine

## 2022-11-30 VITALS — BP 118/80 | HR 67 | Temp 98.1°F | Resp 18 | Ht 62.0 in | Wt 116.8 lb

## 2022-11-30 DIAGNOSIS — E1169 Type 2 diabetes mellitus with other specified complication: Secondary | ICD-10-CM

## 2022-11-30 DIAGNOSIS — Z0000 Encounter for general adult medical examination without abnormal findings: Secondary | ICD-10-CM

## 2022-11-30 DIAGNOSIS — E785 Hyperlipidemia, unspecified: Secondary | ICD-10-CM

## 2022-11-30 DIAGNOSIS — E1165 Type 2 diabetes mellitus with hyperglycemia: Secondary | ICD-10-CM

## 2022-11-30 DIAGNOSIS — E119 Type 2 diabetes mellitus without complications: Secondary | ICD-10-CM

## 2022-11-30 LAB — CBC WITH DIFFERENTIAL/PLATELET
Basophils Absolute: 0 10*3/uL (ref 0.0–0.1)
Basophils Relative: 0.6 % (ref 0.0–3.0)
Eosinophils Absolute: 0.2 10*3/uL (ref 0.0–0.7)
Eosinophils Relative: 3.5 % (ref 0.0–5.0)
HCT: 37 % (ref 36.0–46.0)
Hemoglobin: 12.6 g/dL (ref 12.0–15.0)
Lymphocytes Relative: 22.2 % (ref 12.0–46.0)
Lymphs Abs: 1.5 10*3/uL (ref 0.7–4.0)
MCHC: 34.1 g/dL (ref 30.0–36.0)
MCV: 91.9 fl (ref 78.0–100.0)
Monocytes Absolute: 0.7 10*3/uL (ref 0.1–1.0)
Monocytes Relative: 10.5 % (ref 3.0–12.0)
Neutro Abs: 4.2 10*3/uL (ref 1.4–7.7)
Neutrophils Relative %: 63.2 % (ref 43.0–77.0)
Platelets: 219 10*3/uL (ref 150.0–400.0)
RBC: 4.03 Mil/uL (ref 3.87–5.11)
RDW: 14.7 % (ref 11.5–15.5)
WBC: 6.6 10*3/uL (ref 4.0–10.5)

## 2022-11-30 LAB — COMPREHENSIVE METABOLIC PANEL
ALT: 17 U/L (ref 0–35)
AST: 23 U/L (ref 0–37)
Albumin: 4.3 g/dL (ref 3.5–5.2)
Alkaline Phosphatase: 49 U/L (ref 39–117)
BUN: 15 mg/dL (ref 6–23)
CO2: 29 mEq/L (ref 19–32)
Calcium: 9.5 mg/dL (ref 8.4–10.5)
Chloride: 102 mEq/L (ref 96–112)
Creatinine, Ser: 1.03 mg/dL (ref 0.40–1.20)
GFR: 52.22 mL/min — ABNORMAL LOW (ref >60.00)
Glucose, Bld: 119 mg/dL — ABNORMAL HIGH (ref 70–99)
Potassium: 4 mEq/L (ref 3.5–5.1)
Sodium: 140 mEq/L (ref 135–145)
Total Bilirubin: 0.6 mg/dL (ref 0.2–1.2)
Total Protein: 7.2 g/dL (ref 6.0–8.3)

## 2022-11-30 LAB — LIPID PANEL
Cholesterol: 199 mg/dL (ref 0–200)
HDL: 89.6 mg/dL (ref >39.00)
LDL Cholesterol: 91 mg/dL (ref 0–99)
NonHDL: 109.52
Total CHOL/HDL Ratio: 2
Triglycerides: 92 mg/dL (ref 0.0–149.0)
VLDL: 18.4 mg/dL (ref 0.0–40.0)

## 2022-11-30 LAB — MICROALBUMIN / CREATININE URINE RATIO
Creatinine,U: 38.2 mg/dL
Microalb Creat Ratio: 2.7 mg/g (ref 0.0–30.0)
Microalb, Ur: 1 mg/dL (ref 0.0–1.9)

## 2022-11-30 LAB — HEMOGLOBIN A1C: Hgb A1c MFr Bld: 7 % — ABNORMAL HIGH (ref 4.6–6.5)

## 2022-11-30 MED ORDER — ACCU-CHEK AVIVA PLUS VI STRP: ORAL_STRIP | 1 refills | Status: AC

## 2022-11-30 NOTE — Assessment & Plan Note (Signed)
Encourage heart healthy diet such as MIND or DASH diet, increase exercise, avoid trans fats, simple carbohydrates and processed foods, consider a krill or fish or flaxseed oil cap daily.  °

## 2022-11-30 NOTE — Patient Instructions (Signed)

## 2022-11-30 NOTE — Assessment & Plan Note (Signed)
hgba1c to be checked, minimize simple carbs. Increase exercise as tolerated. Continue current meds  

## 2022-11-30 NOTE — Telephone Encounter (Signed)
Future labs already in

## 2022-11-30 NOTE — Progress Notes (Signed)
Established Patient Office Visit  Subjective   Patient ID: Briana Craig, female    DOB: 14-Feb-1946  Age: 77 y.o. MRN: [id number]  Chief Complaint  Patient presents with   Hyperlipidemia   Follow-up    HPI Pt here to f/u cholesterol  Patient Active Problem List   Diagnosis Date Noted   Age-related osteoporosis without current pathological fracture 11/27/2021   Preventative health care 05/27/2021   Periorbital cellulitis of right eye 09/11/2019   Diet-controlled diabetes mellitus ([place]) 05/22/2019   Ductal carcinoma in situ (DCIS) of right breast 05/22/2019   Nipple lesion 11/21/2018   Controlled type 2 diabetes mellitus without complication, without long-term current use of insulin ([place]) 05/19/2018   Hyperlipidemia associated with type 2 diabetes mellitus ([place]) 05/19/2018   Vitamin D deficiency 05/19/2018   History of esophageal cancer 05/19/2018   Hematuria 04/08/2015   UTI (urinary tract infection) 04/04/2015   DM (diabetes mellitus) type II uncontrolled, periph vascular disorder 12/06/2014   Dysuria 01/25/2014   IFG (impaired fasting glucose) 01/25/2014   Unspecified vitamin D deficiency 05/30/2013   Encounter for therapeutic drug monitoring 05/30/2013   Impaired fasting glucose 05/30/2013   Other malaise and fatigue 05/30/2013   Hyperlipidemia 05/30/2013   GERD (gastroesophageal reflux disease) 05/30/2013   Need for prophylactic vaccination with combined diphtheria-tetanus-pertussis (DTP) vaccine 05/30/2013   Esophageal cancer ([place])    Dysphagia 11/17/2011   Cancer of esophagus ([place]) 11/13/2011   Past Medical History:  Diagnosis Date   Breast cancer ([place]) 12/2018   right DCIS   Dysphagia    Esophageal cancer ([place])    GERD (gastroesophageal reflux disease)    Hyperlipemia    Hypertension    no meds now   Impaired fasting blood sugar    Personal history of radiation therapy    PONV (postoperative nausea and vomiting)    Pre-diabetes    Past Surgical  History:  Procedure Laterality Date   BREAST BIOPSY Right 12/23/2018   BREAST LUMPECTOMY Right 01/18/2019   BREAST LUMPECTOMY WITH RADIOACTIVE SEED LOCALIZATION Right 01/18/2019   Procedure: RIGHT BREAST CENTRAL LUMPECTOMY WITH RADIOACTIVE SEED LOCALIZATION;  Surgeon: Rolm Bookbinder, MD;  Location: [facility];  Service: General;  Laterality: Right;   DIRECT LARYNGOSCOPY WITH BOTOX INJECTION     tx paralysed vocal cord   ESOPHAGOGASTRODUODENOSCOPY  11/17/2011   Procedure: ESOPHAGOGASTRODUODENOSCOPY (EGD);  Surgeon: Owens Loffler, MD;  Location: [facility];  Service: Endoscopy;  Laterality: N/A;   ESOPHAGUS SURGERY     EYE SURGERY     both cataracts   GANGLION CYST EXCISION Left 01/26/2013   Procedure: LEFT FLEXOR CARPI RADIALIS  RELEASE AND SCAPHO TRAPEZIAL TRAPEZOID DEBRIDEMENT ;  Surgeon: Tennis Must, MD;  Location: [facility];  Service: Orthopedics;  Laterality: Left;   Social History   Tobacco Use   Smoking status: Former    Types: Cigarettes    Quit date: 08/30/1987    Years since quitting: 35.2   Smokeless tobacco: Never  Substance Use Topics   Alcohol use: No   Drug use: Never   Social History   Socioeconomic History   Marital status: Widowed    Spouse name: Sonia Side   Number of children: 0   Years of education: 18   Highest education level: Not on file  Occupational History   Occupation: RETIRED     Employer: OTHER  Tobacco Use   Smoking status: Former    Types: Cigarettes    Quit date: 08/30/1987  Years since quitting: 35.2   Smokeless tobacco: Never  Substance and Sexual Activity   Alcohol use: No   Drug use: Never   Sexual activity: Yes    Partners: Male    Birth control/protection: Post-menopausal  Other Topics Concern   Not on file  Social History Narrative   Marital Status:  Married Sonia Side)   Children:  None    Pets: Dog (1)    Living Situation: Lives with husband.   Occupation:  Retired Visual merchandiser)    Education:  Master's Degree    Tobacco Use/Exposure:  Formal Smoker    Alcohol Use:  Occasional   Drug Use:  None   Diet:  Regular   Exercise:  Walking 1 mile 5 times per week.    Hobbies:  Reading                Social Determinants of Health   Financial Resource Strain: Low Risk  (01/06/2022)   Overall Financial Resource Strain (CARDIA)    Difficulty of Paying Living Expenses: Not hard at all  Food Insecurity: No Food Insecurity (09/11/2022)   Hunger Vital Sign    Worried About Running Out of Food in the Last Year: Never true    Ran Out of Food in the Last Year: Never true  Transportation Needs: No Transportation Needs (09/11/2022)   PRAPARE - Hydrologist (Medical): No    Lack of Transportation (Non-Medical): No  Physical Activity: Sufficiently Active (01/06/2022)   Exercise Vital Sign    Days of Exercise per Week: 7 days    Minutes of Exercise per Session: 60 min  Stress: No Stress Concern Present (12/19/2020)   [place]    Feeling of Stress : Not at all  Social Connections: Socially Isolated (01/06/2022)   Social Connection and Isolation Panel [NHANES]    Frequency of Communication with Friends and Family: More than three times a week    Frequency of Social Gatherings with Friends and Family: More than three times a week    Attends Religious Services: Never    Marine scientist or Organizations: No    Attends Archivist Meetings: Never    Marital Status: Widowed  Intimate Partner Violence: Not At Risk (12/19/2020)   Humiliation, Afraid, Rape, and Kick questionnaire    Fear of Current or Ex-Partner: No    Emotionally Abused: No    Physically Abused: No    Sexually Abused: No   Family Status  Relation Name Status   Sister  Alive   Mother  Deceased   Father  Deceased   Neg Hx  (Not Specified)   Family History  Problem Relation Age of Onset   Alcohol abuse Sister    AAA  (abdominal aortic aneurysm) Mother    COPD Father    Colon cancer Neg Hx    Allergies  Allergen Reactions   Taxol [Paclitaxel] Hives and Itching   Tramadol     "Sweaty, shaky and faint"   Carboplatin Hives and Itching   Codeine    Compazine    Compazine  [Prochlorperazine Maleate] Other (See Comments)    Severe chest pain   Penicillins    Prochlorperazine Edisylate     Other reaction(s): Other (See Comments) Severe chest pain      Review of Systems  Constitutional:  Negative for fever and malaise/fatigue.  HENT:  Negative for congestion.   Eyes:  Negative for blurred  vision.  Respiratory:  Negative for shortness of breath.   Cardiovascular:  Negative for chest pain, palpitations and leg swelling.  Gastrointestinal:  Negative for abdominal pain, blood in stool and nausea.  Genitourinary:  Negative for dysuria and frequency.  Musculoskeletal:  Negative for falls.  Skin:  Negative for rash.  Neurological:  Negative for dizziness, loss of consciousness and headaches.  Endo/Heme/Allergies:  Negative for environmental allergies.  Psychiatric/Behavioral:  Negative for depression. The patient is not nervous/anxious.       Objective:     BP 118/80 (BP Location: Left Arm, Patient Position: Sitting, Cuff Size: Normal)   Pulse 67   Temp 98.1 F (36.7 C) (Oral)   Resp 18   Ht '5\' 2"'$  (1.575 m)   Wt 116 lb 12.8 oz (53 kg)   SpO2 100%   BMI 21.36 kg/m  BP Readings from Last 3 Encounters:  11/30/22 118/80  05/29/22 136/86  11/27/21 130/90   Wt Readings from Last 3 Encounters:  11/30/22 116 lb 12.8 oz (53 kg)  05/29/22 120 lb 6.4 oz (54.6 kg)  11/27/21 122 lb 3.2 oz (55.4 kg)   SpO2 Readings from Last 3 Encounters:  11/30/22 100%  05/29/22 98%  11/27/21 100%      Physical Exam Vitals and nursing note reviewed.  Constitutional:      Appearance: She is well-developed.  HENT:     Head: Normocephalic and atraumatic.  Eyes:     Conjunctiva/sclera: Conjunctivae  normal.  Neck:     Thyroid: No thyromegaly.     Vascular: No carotid bruit or JVD.  Cardiovascular:     Rate and Rhythm: Normal rate and regular rhythm.     Heart sounds: Normal heart sounds. No murmur heard. Pulmonary:     Effort: Pulmonary effort is normal. No respiratory distress.     Breath sounds: Normal breath sounds. No wheezing or rales.  Chest:     Chest wall: No tenderness.  Musculoskeletal:     Cervical back: Normal range of motion and neck supple.  Neurological:     Mental Status: She is alert and oriented to person, place, and time.  Psychiatric:        Mood and Affect: Mood normal.        Behavior: Behavior normal.        Thought Content: Thought content normal.        Judgment: Judgment normal.      No results found for any visits on 11/30/22.  Last CBC Lab Results  Component Value Date   WBC 8.1 05/29/2022   HGB 12.8 05/29/2022   HCT 38.7 05/29/2022   MCV 91.7 05/29/2022   MCH 28.6 04/24/2013   RDW 14.5 05/29/2022   PLT 202.0 58/85/0277   Last metabolic panel Lab Results  Component Value Date   GLUCOSE 128 (H) 05/29/2022   NA 140 05/29/2022   K 4.4 05/29/2022   CL 102 05/29/2022   CO2 31 05/29/2022   BUN 19 05/29/2022   CREATININE 1.07 05/29/2022   GFRNONAA 53 (L) 04/16/2014   CALCIUM 10.3 05/29/2022   PROT 7.6 05/29/2022   ALBUMIN 4.4 05/29/2022   BILITOT 0.7 05/29/2022   ALKPHOS 51 05/29/2022   AST 23 05/29/2022   ALT 17 05/29/2022   Last lipids Lab Results  Component Value Date   CHOL 204 (H) 05/29/2022   HDL 86.40 05/29/2022   LDLCALC 93 05/29/2022   TRIG 122.0 05/29/2022   CHOLHDL 2 05/29/2022   Last hemoglobin A1c  Lab Results  Component Value Date   HGBA1C 7.1 (H) 05/29/2022   Last thyroid functions Lab Results  Component Value Date   TSH 2.147 04/24/2013      The 10-year ASCVD risk score (Arnett DK, et al., 2019) is: 35.1%    Assessment & Plan:   Problem List Items Addressed This Visit       Unprioritized    Hyperlipidemia associated with type 2 diabetes mellitus ([place])    Encourage heart healthy diet such as MIND or DASH diet, increase exercise, avoid trans fats, simple carbohydrates and processed foods, consider a krill or fish or flaxseed oil cap daily.        Relevant Orders   Lipid panel   CBC with Differential/Platelet   Comprehensive metabolic panel   Hemoglobin A1c   Microalbumin / creatinine urine ratio   Diet-controlled diabetes mellitus ([place]) - Primary    hgba1c to be checked , minimize simple carbs. Increase exercise as tolerated. Continue current meds       Relevant Orders   Lipid panel   CBC with Differential/Platelet   Comprehensive metabolic panel   Hemoglobin A1c   Microalbumin / creatinine urine ratio   Other Visit Diagnoses     Type 2 diabetes mellitus with hyperglycemia, without long-term current use of insulin ([facility])       Relevant Medications   glucose blood (ACCU-CHEK AVIVA PLUS) test strip       Return in about 6 months (around 05/31/2023), or if symptoms worsen or fail to improve, for annual exam, fasting labs the week before.    Ann Held, DO

## 2022-11-30 NOTE — Telephone Encounter (Signed)
Pt would like to get her labs done the week before her cpe appt. Please put in orders pt is scheduled 7/25 for labs.

## 2022-12-31 ENCOUNTER — Telehealth: Payer: Self-pay | Admitting: Family Medicine

## 2022-12-31 NOTE — Telephone Encounter (Signed)
Contacted Briana Craig to schedule their annual wellness visit. Appointment made for 01/11/2023.  Sherol Dade; Care Guide [facility] Direct Dial: [phone]

## 2023-01-11 ENCOUNTER — Ambulatory Visit (INDEPENDENT_AMBULATORY_CARE_PROVIDER_SITE_OTHER): Payer: Medicare PPO | Admitting: *Deleted

## 2023-01-11 DIAGNOSIS — Z0000 Encounter for general adult medical examination without abnormal findings: Secondary | ICD-10-CM

## 2023-01-11 NOTE — Progress Notes (Signed)
Subjective:   Briana Craig is a 77 y.o. female who presents for Medicare Annual (Subsequent) preventive examination.  I connected with  Briana Craig on 01/11/23 by a audio enabled telemedicine application and verified that I am speaking with the correct person using two identifiers.  Patient Location: Home  Provider Location: Office/Clinic  I discussed the limitations of evaluation and management by telemedicine. The patient expressed understanding and agreed to proceed.   Review of Systems     Cardiac Risk Factors include: advanced age (>67mn, >>56women);dyslipidemia;diabetes mellitus     Objective:    There were no vitals filed for this visit. There is no height or weight on file to calculate BMI.     01/11/2023    1:01 PM 01/06/2022   11:45 AM 03/25/2021   10:58 AM 12/19/2020    3:21 PM 11/24/2019    2:10 PM 01/18/2019    8:55 AM 01/09/2019    3:00 PM  Advanced Directives  Does Patient Have a Medical Advance Directive? Yes Yes Yes Yes Yes Yes Yes  Type of AParamedicof APetaluma CenterLiving will;Out of facility DNR (pink MOST or yellow form) HMount MorrisLiving will;Out of facility DNR (pink MOST or yellow form)  Living will H[place]in[place]Living will [place]matillaLiving will Living will  Does patient want to make changes to medical advance directive? No - Patient declined    No - Patient declined No - Patient declined No - Patient declined  Copy of [place] Chart? No - copy requested No - copy requested   No - copy requested No - copy requested No - copy requested    Current Medications (verified) Outpatient Encounter Medications as of 01/11/2023  Medication Sig   Accu-Chek Softclix Lancets lancets Use to check blood sugar once a day.  DX  E11.9   Alcohol Swabs (ALCOHOL WIPES) 70 % PADS To use to check sugars   Blood Glucose Calibration (ACCU-CHEK AVIVA) SOLN Use to check control  when opening a new bottle of strips.  DX E11.9   Blood Glucose Monitoring Suppl (ACCU-CHEK AVIVA PLUS) w/Device KIT Use to check blood sugar once a day.  DX E11.9   Blood Glucose Monitoring Suppl (TRUE METRIX AIR GLUCOSE METER) DEVI Use as directed to check glucose twice daily. E11.9   CALCIUM-VITAMIN D PO Take 600 mg by mouth daily.   cetirizine (ZYRTEC) 10 MG tablet Take 10 mg by mouth as needed for allergies.   famotidine (PEPCID) 40 MG tablet Take 1 tablet (40 mg total) by mouth at bedtime.   fluticasone (FLONASE) 50 MCG/ACT nasal spray Place 2 sprays into the nose daily as needed.    glucose blood (ACCU-CHEK AVIVA PLUS) test strip Use to check blood sugar once daily.  DX  E 11.9   latanoprost (XALATAN) 0.005 % ophthalmic solution Place 1 drop into both eyes daily.   Multiple Vitamins-Minerals (MULTIVITAMIN PO) Take 1 tablet by mouth daily.   Multiple Vitamins-Minerals (PRESERVISION AREDS) CAPS Take by mouth.   Omega-3 Fatty Acids (FISH OIL PO) Take 1,200 mg by mouth daily.   pantoprazole (PROTONIX) 40 MG tablet TAKE 1 TABLET EVERY DAY (NEED MD APPOINTMENT)   simvastatin (ZOCOR) 20 MG tablet TAKE 1 TABLET EVERY DAY AT 6 P.M. (NEED MD APPOINTMENT)   TRUEplus Lancets 33G MISC Use as directed. Check glucose up to twice daily. E11.9   No facility-administered encounter medications on file as of 01/11/2023.  Allergies (verified) Taxol [paclitaxel], Tramadol, Carboplatin, Codeine, Compazine, Compazine  [prochlorperazine maleate], Penicillins, and Prochlorperazine edisylate   History: Past Medical History:  Diagnosis Date   Breast cancer ([place]) 12/2018   right DCIS   Dysphagia    Esophageal cancer ([place])    GERD (gastroesophageal reflux disease)    Hyperlipemia    Hypertension    no meds now   Impaired fasting blood sugar    Personal history of radiation therapy    PONV (postoperative nausea and vomiting)    Pre-diabetes    Past Surgical History:  Procedure Laterality Date    BREAST BIOPSY Right 12/23/2018   BREAST LUMPECTOMY Right 01/18/2019   BREAST LUMPECTOMY WITH RADIOACTIVE SEED LOCALIZATION Right 01/18/2019   Procedure: RIGHT BREAST CENTRAL LUMPECTOMY WITH RADIOACTIVE SEED LOCALIZATION;  Surgeon: Rolm Bookbinder, MD;  Location: [facility];  Service: General;  Laterality: Right;   DIRECT LARYNGOSCOPY WITH BOTOX INJECTION     tx paralysed vocal cord   ESOPHAGOGASTRODUODENOSCOPY  11/17/2011   Procedure: ESOPHAGOGASTRODUODENOSCOPY (EGD);  Surgeon: Owens Loffler, MD;  Location: [facility];  Service: Endoscopy;  Laterality: N/A;   ESOPHAGUS SURGERY     EYE SURGERY     both cataracts   GANGLION CYST EXCISION Left 01/26/2013   Procedure: LEFT FLEXOR CARPI RADIALIS  RELEASE AND SCAPHO TRAPEZIAL TRAPEZOID DEBRIDEMENT ;  Surgeon: Tennis Must, MD;  Location: [facility];  Service: Orthopedics;  Laterality: Left;   Family History  Problem Relation Age of Onset   Alcohol abuse Sister    AAA (abdominal aortic aneurysm) Mother    COPD Father    Colon cancer Neg Hx    Social History   Socioeconomic History   Marital status: Widowed    Spouse name: Briana Craig   Number of children: 0   Years of education: 18   Highest education level: Not on file  Occupational History   Occupation: RETIRED     Employer: OTHER  Tobacco Use   Smoking status: Former    Types: Cigarettes    Quit date: 08/30/1987    Years since quitting: 35.3   Smokeless tobacco: Never  Substance and Sexual Activity   Alcohol use: No   Drug use: Never   Sexual activity: Yes    Partners: Male    Birth control/protection: Post-menopausal  Other Topics Concern   Not on file  Social History Narrative   Marital Status:  Married Briana Craig)   Children:  None    Pets: Dog (1)    Living Situation: Lives with husband.   Occupation:  Retired Visual merchandiser)    Education: Master's Degree    Tobacco Use/Exposure:  Formal Smoker    Alcohol Use:  Occasional   Drug Use:  None    Diet:  Regular   Exercise:  Walking 1 mile 5 times per week.    Hobbies:  Reading                Social Determinants of Health   Financial Resource Strain: Low Risk  (01/06/2022)   Overall Financial Resource Strain (CARDIA)    Difficulty of Paying Living Expenses: Not hard at all  Food Insecurity: No Food Insecurity (09/11/2022)   Hunger Vital Sign    Worried About Running Out of Food in the Last Year: Never true    Ran Out of Food in the Last Year: Never true  Transportation Needs: No Transportation Needs (09/11/2022)   PRAPARE - Hydrologist (Medical): No  Lack of Transportation (Non-Medical): No  Physical Activity: Sufficiently Active (01/06/2022)   Exercise Vital Sign    Days of Exercise per Week: 7 days    Minutes of Exercise per Session: 60 min  Stress: No Stress Concern Present (12/19/2020)   [place]    Feeling of Stress : Not at all  Social Connections: Socially Isolated (01/06/2022)   Social Connection and Isolation Panel [NHANES]    Frequency of Communication with Friends and Family: More than three times a week    Frequency of Social Gatherings with Friends and Family: More than three times a week    Attends Religious Services: Never    Marine scientist or Organizations: No    Attends Archivist Meetings: Never    Marital Status: Widowed    Tobacco Counseling Counseling given: Not Answered   Clinical Intake:  Pre-visit preparation completed: Yes  Pain : No/denies pain  Nutritional Risks: None Diabetes: Yes CBG done?: No Did pt. bring in CBG monitor from home?: No  How often do you need to have someone help you when you read instructions, pamphlets, or other written materials from your doctor or pharmacy?: 1 - Never   Activities of Daily Living    01/11/2023    1:04 PM  In your present state of health, do you have any difficulty performing  the following activities:  Hearing? 0  Vision? 0  Difficulty concentrating or making decisions? 0  Walking or climbing stairs? 0  Dressing or bathing? 0  Doing errands, shopping? 0  Preparing Food and eating ? N  Using the Toilet? N  In the past six months, have you accidently leaked urine? N  Do you have problems with loss of bowel control? N  Managing your Medications? N  Managing your Finances? N  Housekeeping or managing your Housekeeping? N    Patient Care Team: Carollee Herter, Alferd Apa, DO as PCP - General (Family Medicine) Calvert Cantor, MD as Consulting Physician (Ophthalmology) Thea Silversmith, MD as Referring Physician (Radiation Oncology) Surgery, [facility] (General Surgery)  Indicate any recent Medical Services you may have received from other than [facility] providers in the past year (date may be approximate).     Assessment:   This is a routine wellness examination for [place].  Hearing/Vision screen No results found.  Dietary issues and exercise activities discussed: Current Exercise Habits: Home exercise routine, Type of exercise: walking, Time (Minutes): 20, Frequency (Times/Week): 7, Weekly Exercise (Minutes/Week): 140, Intensity: Mild, Exercise limited by: None identified   Goals Addressed   None    Depression Screen    01/11/2023    1:03 PM 11/30/2022    8:31 AM 01/06/2022   11:57 AM 01/06/2022   11:56 AM 12/19/2020    3:23 PM 11/24/2019    2:13 PM 05/19/2018    8:22 AM  PHQ 2/9 Scores  PHQ - 2 Score 0 0 0 0 0 0 0    Fall Risk    01/11/2023    1:01 PM 11/30/2022    8:31 AM 01/06/2022   11:44 AM 12/19/2020    3:22 PM 11/24/2019    2:13 PM  Fall Risk   Falls in the past year? 0 0 0 0 0  Number falls in past yr: 0 0 0 0 0  Injury with Fall? 0 0 0 0 0  Risk for fall due to : No Fall Risks  No Fall Risks  Follow up Falls evaluation completed Falls evaluation completed  Falls prevention discussed Education provided;Falls prevention discussed    FALL  RISK PREVENTION PERTAINING TO THE HOME:  Any stairs in or around the home? No  Home free of loose throw rugs in walkways, pet beds, electrical cords, etc? Yes  Adequate lighting in your home to reduce risk of falls? Yes   ASSISTIVE DEVICES UTILIZED TO PREVENT FALLS:  Life alert? No  Use of a cane, walker or w/c? No  Grab bars in the bathroom? Yes  Shower chair or bench in shower?  Built in seat Elevated toilet seat or a handicapped toilet? Yes   TIMED UP AND GO:  Was the test performed?  No, audio visit .   Cognitive Function:        01/11/2023    1:07 PM 01/06/2022   11:49 AM  6CIT Screen  What Year? 0 points 0 points  What month? 0 points 0 points  What time? 0 points 0 points  Count back from 20 0 points 0 points  Months in reverse 0 points 0 points  Repeat phrase 0 points 0 points  Total Score 0 points 0 points    Immunizations Immunization History  Administered Date(s) Administered   COVID-19, mRNA, vaccine(Comirnaty)12 years and older 08/18/2022   Fluad Quad(high Dose 65+) 08/18/2022   Influenza, High Dose Seasonal PF 08/16/2014, 08/13/2015, 08/20/2016, 08/02/2017, 08/16/2018, 07/17/2019   Influenza-Unspecified 08/13/2015, 08/01/2020, 08/02/2021   PFIZER(Purple Top)SARS-COV-2 Vaccination 11/21/2019, 12/11/2019, 08/02/2020, 04/24/2021   Pfizer Covid-19 Vaccine Bivalent Booster 38yr & up 08/12/2021   Pneumococcal Conjugate-13 09/03/2014   Pneumococcal Polysaccharide-23 12/03/2010, 11/16/2016   Tdap 05/01/2013   Zoster Recombinat (Shingrix) 06/04/2018, 10/01/2018    TDAP status: Up to date  Flu Vaccine status: Up to date  Pneumococcal vaccine status: Up to date  Covid-19 vaccine status: Information provided on how to obtain vaccines.   Qualifies for Shingles Vaccine? Yes   Zostavax completed No   Shingrix Completed?: Yes  Screening Tests Health Maintenance  Topic Date Due   COVID-19 Vaccine (7 - 2023-24 season) 10/13/2022   FOOT EXAM  11/27/2022    Medicare Annual Wellness (AWV)  01/07/2023   MAMMOGRAM  05/30/2023 (Originally 03/18/2022)   DTaP/Tdap/Td (2 - Td or Tdap) 05/02/2023   OPHTHALMOLOGY EXAM  05/12/2023   HEMOGLOBIN A1C  05/31/2023   Diabetic kidney evaluation - eGFR measurement  12/01/2023   Diabetic kidney evaluation - Urine ACR  12/01/2023   Pneumonia Vaccine 79 Years old  Completed   INFLUENZA VACCINE  Completed   DEXA SCAN  Completed   Hepatitis C Screening  Completed   Zoster Vaccines- Shingrix  Completed   HPV VACCINES  Aged Out   COLONOSCOPY (Pts [phone] coverage will need to be confirmed)  Discontinued    Health Maintenance  Health Maintenance Due  Topic Date Due   COVID-19 Vaccine (7 - 2023-24 season) 10/13/2022   FOOT EXAM  11/27/2022   Medicare Annual Wellness (AWV)  01/07/2023    Colorectal cancer screening: No longer required.   Mammogram status: Completed 03/18/21. Repeat every year  Bone Density status: Completed 07/17/21. Results reflect: Bone density results: OSTEOPOROSIS. Repeat every 2 years.  Lung Cancer Screening: (Low Dose CT Chest recommended if Age 974-80ears, 30 pack-year currently smoking OR have quit w/in 15years.) does not qualify.   Additional Screening:  Hepatitis C Screening: does qualify; Completed 11/25/15  Vision Screening: Recommended annual ophthalmology exams for early detection of glaucoma and other disorders of  the eye. Is the patient up to date with their annual eye exam?  Yes  Who is the provider or what is the name of the office in which the patient attends annual eye exams? Dr. Eulas [facility]. If pt is not established with a provider, would they like to be referred to a provider to establish care? No .   Dental Screening: Recommended annual dental exams for proper oral hygiene  Community Resource Referral / Chronic Care Management: CRR required this visit?  No   CCM required this visit?  No      Plan:     I have personally reviewed and  noted the following in the patient's chart:   Medical and social history Use of alcohol, tobacco or illicit drugs  Current medications and supplements including opioid prescriptions. Patient is not currently taking opioid prescriptions. Functional ability and status Nutritional status Physical activity Advanced directives List of other physicians Hospitalizations, surgeries, and ER visits in previous 12 months Vitals Screenings to include cognitive, depression, and falls Referrals and appointments  In addition, I have reviewed and discussed with patient certain preventive protocols, quality metrics, and best practice recommendations. A written personalized care plan for preventive services as well as general preventive health recommendations were provided to patient.   Due to this being a telephonic visit, the after visit summary with patients personalized plan was offered to patient via mail or my-chart. Patient would like to access on my-chart.  Beatris Ship, [place]   01/11/2023   Nurse Notes: None

## 2023-01-11 NOTE — Patient Instructions (Signed)
Briana Craig , Thank you for taking time to come for your Medicare Wellness Visit. I appreciate your ongoing commitment to your health goals. Please review the following plan we discussed and let me know if I can assist you in the future.   These are the goals we discussed:  Goals      Maintain health and independence     Patient Stated     Increase activity with more walking        This is a list of the screening recommended for you and due dates:  Health Maintenance  Topic Date Due   COVID-19 Vaccine (7 - 2023-24 season) 10/13/2022   Complete foot exam   11/27/2022   Mammogram  05/30/2023*   DTaP/Tdap/Td vaccine (2 - Td or Tdap) 05/02/2023   Eye exam for diabetics  05/12/2023   Hemoglobin A1C  05/31/2023   Yearly kidney function blood test for diabetes  12/01/2023   Yearly kidney health urinalysis for diabetes  12/01/2023   Medicare Annual Wellness Visit  01/11/2024   Pneumonia Vaccine  Completed   Flu Shot  Completed   DEXA scan (bone density measurement)  Completed   Hepatitis C Screening: USPSTF Recommendation to screen - Ages 16-79 yo.  Completed   Zoster (Shingles) Vaccine  Completed   HPV Vaccine  Aged Out   Colon Cancer Screening  Discontinued  *Topic was postponed. The date shown is not the original due date.     Next appointment: Follow up in one year for your annual wellness visit.   Preventive Care 46 Years and Older, Female Preventive care refers to lifestyle choices and visits with your health care provider that can promote health and wellness. What does preventive care include? A yearly physical exam. This is also called an annual well check. Dental exams once or twice a year. Routine eye exams. Ask your health care provider how often you should have your eyes checked. Personal lifestyle choices, including: Daily care of your teeth and gums. Regular physical activity. Eating a healthy diet. Avoiding tobacco and drug use. Limiting alcohol  use. Practicing safe sex. Taking low-dose aspirin every day. Taking vitamin and mineral supplements as recommended by your health care provider. What happens during an annual well check? The services and screenings done by your health care provider during your annual well check will depend on your age, overall health, lifestyle risk factors, and family history of disease. Counseling  Your health care provider may ask you questions about your: Alcohol use. Tobacco use. Drug use. Emotional well-being. Home and relationship well-being. Sexual activity. Eating habits. History of falls. Memory and ability to understand (cognition). Work and work Statistician. Reproductive health. Screening  You may have the following tests or measurements: Height, weight, and BMI. Blood pressure. Lipid and cholesterol levels. These may be checked every 5 years, or more frequently if you are over 57 years old. Skin check. Lung cancer screening. You may have this screening every year starting at age 71 if you have a 30-pack-year history of smoking and currently smoke or have quit within the past 15 years. Fecal occult blood test (FOBT) of the stool. You may have this test every year starting at age 38. Flexible sigmoidoscopy or colonoscopy. You may have a sigmoidoscopy every 5 years or a colonoscopy every 10 years starting at age 70. Hepatitis C blood test. Hepatitis B blood test. Sexually transmitted disease (STD) testing. Diabetes screening. This is done by checking your blood sugar (glucose) after you have  not eaten for a while (fasting). You may have this done every 1-3 years. Bone density scan. This is done to screen for osteoporosis. You may have this done starting at age 38. Mammogram. This may be done every 1-2 years. Talk to your health care provider about how often you should have regular mammograms. Talk with your health care provider about your test results, treatment options, and if necessary,  the need for more tests. Vaccines  Your health care provider may recommend certain vaccines, such as: Influenza vaccine. This is recommended every year. Tetanus, diphtheria, and acellular pertussis (Tdap, Td) vaccine. You may need a Td booster every 10 years. Zoster vaccine. You may need this after age 46. Pneumococcal 13-valent conjugate (PCV13) vaccine. One dose is recommended after age 45. Pneumococcal polysaccharide (PPSV23) vaccine. One dose is recommended after age 73. Talk to your health care provider about which screenings and vaccines you need and how often you need them. This information is not intended to replace advice given to you by your health care provider. Make sure you discuss any questions you have with your health care provider. Document Released: 11/15/2015 Document Revised: 07/08/2016 Document Reviewed: 08/20/2015 Elsevier Interactive Patient Education  2017 [place] Prevention in the Home Falls can cause injuries. They can happen to people of all ages. There are many things you can do to make your home safe and to help prevent falls. What can I do on the outside of my home? Regularly fix the edges of walkways and driveways and fix any cracks. Remove anything that might make you trip as you walk through a door, such as a raised step or threshold. Trim any bushes or trees on the path to your home. Use bright outdoor lighting. Clear any walking paths of anything that might make someone trip, such as rocks or tools. Regularly check to see if handrails are loose or broken. Make sure that both sides of any steps have handrails. Any raised decks and porches should have guardrails on the edges. Have any leaves, snow, or ice cleared regularly. Use sand or salt on walking paths during winter. Clean up any spills in your garage right away. This includes oil or grease spills. What can I do in the bathroom? Use night lights. Install grab bars by the toilet and in the  tub and shower. Do not use towel bars as grab bars. Use non-skid mats or decals in the tub or shower. If you need to sit down in the shower, use a plastic, non-slip stool. Keep the floor dry. Clean up any water that spills on the floor as soon as it happens. Remove soap buildup in the tub or shower regularly. Attach bath mats securely with double-sided non-slip rug tape. Do not have throw rugs and other things on the floor that can make you trip. What can I do in the bedroom? Use night lights. Make sure that you have a light by your bed that is easy to reach. Do not use any sheets or blankets that are too big for your bed. They should not hang down onto the floor. Have a firm chair that has side arms. You can use this for support while you get dressed. Do not have throw rugs and other things on the floor that can make you trip. What can I do in the kitchen? Clean up any spills right away. Avoid walking on wet floors. Keep items that you use a lot in easy-to-reach places. If you need to  reach something above you, use a strong step stool that has a grab bar. Keep electrical cords out of the way. Do not use floor polish or wax that makes floors slippery. If you must use wax, use non-skid floor wax. Do not have throw rugs and other things on the floor that can make you trip. What can I do with my stairs? Do not leave any items on the stairs. Make sure that there are handrails on both sides of the stairs and use them. Fix handrails that are broken or loose. Make sure that handrails are as long as the stairways. Check any carpeting to make sure that it is firmly attached to the stairs. Fix any carpet that is loose or worn. Avoid having throw rugs at the top or bottom of the stairs. If you do have throw rugs, attach them to the floor with carpet tape. Make sure that you have a light switch at the top of the stairs and the bottom of the stairs. If you do not have them, ask someone to add them for  you. What else can I do to help prevent falls? Wear shoes that: Do not have high heels. Have rubber bottoms. Are comfortable and fit you well. Are closed at the toe. Do not wear sandals. If you use a stepladder: Make sure that it is fully opened. Do not climb a closed stepladder. Make sure that both sides of the stepladder are locked into place. Ask someone to hold it for you, if possible. Clearly mark and make sure that you can see: Any grab bars or handrails. First and last steps. Where the edge of each step is. Use tools that help you move around (mobility aids) if they are needed. These include: Canes. Walkers. Scooters. Crutches. Turn on the lights when you go into a dark area. Replace any light bulbs as soon as they burn out. Set up your furniture so you have a clear path. Avoid moving your furniture around. If any of your floors are uneven, fix them. If there are any pets around you, be aware of where they are. Review your medicines with your doctor. Some medicines can make you feel dizzy. This can increase your chance of falling. Ask your doctor what other things that you can do to help prevent falls. This information is not intended to replace advice given to you by your health care provider. Make sure you discuss any questions you have with your health care provider. Document Released: 08/15/2009 Document Revised: 03/26/2016 Document Reviewed: 11/23/2014 Elsevier Interactive Patient Education  2017 [employer].

## 2023-01-12 DIAGNOSIS — H353211 Exudative age-related macular degeneration, right eye, with active choroidal neovascularization: Secondary | ICD-10-CM | POA: Diagnosis not present

## 2023-01-28 DIAGNOSIS — H26492 Other secondary cataract, left eye: Secondary | ICD-10-CM | POA: Diagnosis not present

## 2023-01-28 DIAGNOSIS — H353211 Exudative age-related macular degeneration, right eye, with active choroidal neovascularization: Secondary | ICD-10-CM | POA: Diagnosis not present

## 2023-01-28 DIAGNOSIS — H401131 Primary open-angle glaucoma, bilateral, mild stage: Secondary | ICD-10-CM | POA: Diagnosis not present

## 2023-01-28 DIAGNOSIS — H353122 Nonexudative age-related macular degeneration, left eye, intermediate dry stage: Secondary | ICD-10-CM | POA: Diagnosis not present

## 2023-04-26 DIAGNOSIS — H353122 Nonexudative age-related macular degeneration, left eye, intermediate dry stage: Secondary | ICD-10-CM | POA: Diagnosis not present

## 2023-04-26 DIAGNOSIS — H353211 Exudative age-related macular degeneration, right eye, with active choroidal neovascularization: Secondary | ICD-10-CM | POA: Diagnosis not present

## 2023-04-26 DIAGNOSIS — H35373 Puckering of macula, bilateral: Secondary | ICD-10-CM | POA: Diagnosis not present

## 2023-04-26 DIAGNOSIS — H43813 Vitreous degeneration, bilateral: Secondary | ICD-10-CM | POA: Diagnosis not present

## 2023-05-22 ENCOUNTER — Emergency Department (HOSPITAL_BASED_OUTPATIENT_CLINIC_OR_DEPARTMENT_OTHER): Payer: Medicare PPO

## 2023-05-22 ENCOUNTER — Encounter (HOSPITAL_BASED_OUTPATIENT_CLINIC_OR_DEPARTMENT_OTHER): Payer: Self-pay

## 2023-05-22 ENCOUNTER — Other Ambulatory Visit: Payer: Self-pay

## 2023-05-22 ENCOUNTER — Emergency Department (HOSPITAL_BASED_OUTPATIENT_CLINIC_OR_DEPARTMENT_OTHER)
Admission: EM | Admit: 2023-05-22 | Discharge: 2023-05-22 | Disposition: A | Discharge status: Home / Self Care | Payer: Medicare PPO | Attending: Emergency Medicine | Admitting: Emergency Medicine

## 2023-05-22 DIAGNOSIS — M7989 Other specified soft tissue disorders: Secondary | ICD-10-CM | POA: Diagnosis not present

## 2023-05-22 DIAGNOSIS — M25531 Pain in right wrist: Secondary | ICD-10-CM | POA: Diagnosis not present

## 2023-05-22 DIAGNOSIS — M8580 Other specified disorders of bone density and structure, unspecified site: Secondary | ICD-10-CM | POA: Diagnosis not present

## 2023-05-22 MED ORDER — PREDNISONE 10 MG PO TABS: 20.0000 mg | ORAL_TABLET | Freq: Every day | ORAL | 0 refills | Status: AC

## 2023-05-22 MED ORDER — PREDNISONE 20 MG PO TABS
20.0000 mg | ORAL_TABLET | Freq: Once | ORAL | Status: AC
  Administered 2023-05-22: 20 mg via ORAL
  Filled 2023-05-22: qty 1

## 2023-05-22 NOTE — Discharge Instructions (Signed)
Take next dose of prednisone tomorrow.  Will do a very short course of steroids to see if you get improvement.  I do not want to spike her blood sugar too much and I think that this is a good way to try to get some improvement.  Recommend continued use of Tylenol, lidocaine cream.  Use splint for comfort.  Recommend ice.  Follow-up with your primary care doctor or with hand doctor.  Please return if symptoms worsen especially if you develop fever, major redness and swelling.

## 2023-05-22 NOTE — ED Provider Notes (Signed)
[facility] Provider Note   CSN: [id number] Arrival date & time: 05/22/23  1230     History  Chief Complaint  Patient presents with   Wrist Pain    Briana Craig is a 77 y.o. female.  Patient here with right wrist pain for the last few days.  No specific injury or trauma.  Has been using Tylenol without much relief.  Using some lidocaine creams as well.  Denies any history of surgery in the joint.  Denies any fevers or chills or redness.  Denies any numbness tingling or weakness.  Just has pain when she flexes and extends at the wrist.  The history is provided by the patient.       Home Medications Prior to Admission medications   Medication Sig Start Date End Date Taking? Authorizing Provider  predniSONE (DELTASONE) 10 MG tablet Take 2 tablets (20 mg total) by mouth daily for 2 days. 05/22/23 05/24/23 Yes Taneeka Curtner, DO  Accu-Chek Softclix Lancets lancets Use to check blood sugar once a day.  DX  E11.9 12/20/19   Donato Schultz, DO  Alcohol Swabs (ALCOHOL WIPES) 70 % PADS To use to check sugars 03/13/20   Zola Button, Grayling Congress, DO  Blood Glucose Calibration (ACCU-CHEK AVIVA) SOLN Use to check control when opening a new bottle of strips.  DX E11.9 12/20/19   Donato Schultz, DO  Blood Glucose Monitoring Suppl (ACCU-CHEK AVIVA PLUS) w/Device KIT Use to check blood sugar once a day.  DX E11.9 06/10/22   Donato Schultz, DO  Blood Glucose Monitoring Suppl (TRUE METRIX AIR GLUCOSE METER) DEVI Use as directed to check glucose twice daily. E11.9 03/08/20   Seabron Spates R, DO  CALCIUM-VITAMIN D PO Take 600 mg by mouth daily.    [provider]  cetirizine (ZYRTEC) 10 MG tablet Take 10 mg by mouth as needed for allergies.    [provider]  famotidine (PEPCID) 40 MG tablet Take 1 tablet (40 mg total) by mouth at bedtime. 08/05/22   Seabron Spates R, DO  fluticasone (FLONASE) 50 MCG/ACT nasal  spray Place 2 sprays into the nose daily as needed.     [provider]  glucose blood (ACCU-CHEK AVIVA PLUS) test strip Use to check blood sugar once daily.  DX  E 11.9 11/30/22   Lowne Chase, Yvonne R, DO  latanoprost (XALATAN) 0.005 % ophthalmic solution Place 1 drop into both eyes daily. 10/29/17   [provider]  Multiple Vitamins-Minerals (MULTIVITAMIN PO) Take 1 tablet by mouth daily.    [provider]  Multiple Vitamins-Minerals (PRESERVISION AREDS) CAPS Take by mouth.    [provider]  Omega-3 Fatty Acids (FISH OIL PO) Take 1,200 mg by mouth daily.    [provider]  pantoprazole (PROTONIX) 40 MG tablet TAKE 1 TABLET EVERY DAY (NEED MD APPOINTMENT) 05/29/22   Zola Button, Grayling Congress, DO  simvastatin (ZOCOR) 20 MG tablet TAKE 1 TABLET EVERY DAY AT 6 P.M. (NEED MD APPOINTMENT) 05/29/22   Zola Button, Grayling Congress, DO  TRUEplus Lancets 33G MISC Use as directed. Check glucose up to twice daily. E11.9 03/08/20   Seabron Spates R, DO      Allergies    Taxol [paclitaxel], Tramadol, Carboplatin, Codeine, Compazine, Compazine  [prochlorperazine maleate], Penicillins, and Prochlorperazine edisylate    Review of Systems   Review of Systems  Physical Exam Updated Vital Signs BP Marland Kitchen)  170/110   Pulse 96   Temp 97.8 F (36.6 C) (Temporal)   Resp 16   Ht 5\' 2"  (1.575 m)   Wt 53 kg   SpO2 95%   BMI 21.37 kg/m  Physical Exam Constitutional:      General: She is not in acute distress.    Appearance: She is not ill-appearing.  Cardiovascular:     Pulses: Normal pulses.  Musculoskeletal:        General: Tenderness present. No swelling. Normal range of motion.     Comments: She is tender with flexion extension the right wrist but she is got great range of motion, she is tender to palpation in the right wrist joint but there is no obvious swelling or deformity or redness.  Skin:    Coloration: Skin is not pale.  Neurological:     General: No focal  deficit present.     Mental Status: She is alert.     Sensory: No sensory deficit.     Motor: No weakness.     Comments: Normal strength and sensation in the right upper extremity     ED Results / Procedures / Treatments   Labs (all labs ordered are listed, but only abnormal results are displayed) Labs Reviewed - No data to display  EKG None  Radiology DG Wrist Complete Right  Result Date: 05/22/2023 CLINICAL DATA:  Pain.  Swelling for 2 days.  No known injury. EXAM: RIGHT WRIST - COMPLETE 3+ VIEW COMPARISON:  None Available. FINDINGS: Mild diffuse osteopenia. No signs of acute fracture or dislocation. No significant arthropathy. Soft tissue edema. IMPRESSION: 1. No acute findings. 2. Soft tissue edema. Electronically Signed   By: Signa Kell M.D.   On: 05/22/2023 13:04    Procedures Procedures    Medications Ordered in ED Medications  predniSONE (DELTASONE) tablet 20 mg (20 mg Oral Given 05/22/23 1300)    ED Course/ Medical Decision Making/ A&P                             Medical Decision Making Amount and/or Complexity of Data Reviewed Radiology: ordered.  Risk Prescription drug management.   Briana Craig is here with right wrist pain.  History of high cholesterol, hypertension.  She is not on diabetes medicine anymore.  Overall vitals unremarkable.  Differential diagnosis likely inflammatory process in the wrist have no concern for septic joint and low suspicion for fracture or malalignment.  Will get an x-ray of the right wrist.  Does not seem consistent with a carpal tunnel.  My suspicion is that this is inflammatory and arthritic in nature.  We had shared decision making and will do some prednisone for a few days.  She has a couple response at home but we will offer her a splint here to see which when she likes better.  She is neurovascular neuromuscular intact otherwise.  I have no concern for DVT or arterial process.  She has strong pulses.    X-ray per my  review interpretation with no obvious fracture or dislocation.  Per radiology report may be some diffuse osteopenia and mild edema.  Overall no concern for infectious process.  Recommend ice, Tylenol, prednisone.  She will follow-up with primary care or hand doctor.  She understands return precautions.  Discharged in good condition.  This chart was dictated using voice recognition software.  Despite best efforts to proofread,  errors can occur which can change the  documentation meaning.        Final Clinical Impression(s) / ED Diagnoses Final diagnoses:  Right wrist pain    Rx / DC Orders ED Discharge Orders          Ordered    predniSONE (DELTASONE) 10 MG tablet  Daily        05/22/23 1308              Virgina Norfolk, DO 05/22/23 1309

## 2023-05-22 NOTE — ED Triage Notes (Signed)
Patient having right wrist pain with swelling. No injury.

## 2023-05-27 ENCOUNTER — Other Ambulatory Visit (INDEPENDENT_AMBULATORY_CARE_PROVIDER_SITE_OTHER): Payer: Medicare PPO

## 2023-05-27 DIAGNOSIS — E119 Type 2 diabetes mellitus without complications: Secondary | ICD-10-CM | POA: Diagnosis not present

## 2023-05-27 DIAGNOSIS — E785 Hyperlipidemia, unspecified: Secondary | ICD-10-CM | POA: Diagnosis not present

## 2023-05-27 LAB — MICROALBUMIN / CREATININE URINE RATIO
Creatinine,U: 66.3 mg/dL
Microalb Creat Ratio: 1.1 mg/g (ref 0.0–30.0)
Microalb, Ur: <0.7 mg/dL (ref 0.0–1.9)

## 2023-05-27 LAB — CBC WITH DIFFERENTIAL/PLATELET
Basophils Absolute: 0 10*3/uL (ref 0.0–0.1)
Basophils Relative: 0.5 % (ref 0.0–3.0)
Eosinophils Absolute: 0.3 10*3/uL (ref 0.0–0.7)
Eosinophils Relative: 4.6 % (ref 0.0–5.0)
HCT: 38.9 % (ref 36.0–46.0)
Hemoglobin: 12.7 g/dL (ref 12.0–15.0)
Lymphocytes Relative: 31.4 % (ref 12.0–46.0)
Lymphs Abs: 1.9 10*3/uL (ref 0.7–4.0)
MCHC: 32.6 g/dL (ref 30.0–36.0)
MCV: 93.7 fl (ref 78.0–100.0)
Monocytes Absolute: 0.8 10*3/uL (ref 0.1–1.0)
Monocytes Relative: 12.9 % — ABNORMAL HIGH (ref 3.0–12.0)
Neutro Abs: 3.1 10*3/uL (ref 1.4–7.7)
Neutrophils Relative %: 50.6 % (ref 43.0–77.0)
Platelets: 265 10*3/uL (ref 150.0–400.0)
RBC: 4.15 Mil/uL (ref 3.87–5.11)
RDW: 15 % (ref 11.5–15.5)
WBC: 6 10*3/uL (ref 4.0–10.5)

## 2023-05-27 LAB — COMPREHENSIVE METABOLIC PANEL
ALT: 16 U/L (ref 0–35)
AST: 21 U/L (ref 0–37)
Albumin: 4 g/dL (ref 3.5–5.2)
Alkaline Phosphatase: 52 U/L (ref 39–117)
BUN: 21 mg/dL (ref 6–23)
CO2: 30 mEq/L (ref 19–32)
Calcium: 9.6 mg/dL (ref 8.4–10.5)
Chloride: 102 mEq/L (ref 96–112)
Creatinine, Ser: 0.99 mg/dL (ref 0.40–1.20)
GFR: 54.57 mL/min — ABNORMAL LOW (ref >60.00)
Glucose, Bld: 116 mg/dL — ABNORMAL HIGH (ref 70–99)
Potassium: 4.1 mEq/L (ref 3.5–5.1)
Sodium: 139 mEq/L (ref 135–145)
Total Bilirubin: 0.6 mg/dL (ref 0.2–1.2)
Total Protein: 6.9 g/dL (ref 6.0–8.3)

## 2023-05-27 LAB — LIPID PANEL
Cholesterol: 193 mg/dL (ref 0–200)
HDL: 70 mg/dL (ref >39.00)
LDL Cholesterol: 93 mg/dL (ref 0–99)
NonHDL: 122.74
Total CHOL/HDL Ratio: 3
Triglycerides: 150 mg/dL — ABNORMAL HIGH (ref 0.0–149.0)
VLDL: 30 mg/dL (ref 0.0–40.0)

## 2023-05-27 LAB — HEMOGLOBIN A1C: Hgb A1c MFr Bld: 6.9 % — ABNORMAL HIGH (ref 4.6–6.5)

## 2023-06-03 ENCOUNTER — Other Ambulatory Visit: Payer: Self-pay | Admitting: Family Medicine

## 2023-06-03 ENCOUNTER — Telehealth: Payer: Self-pay | Admitting: Family Medicine

## 2023-06-03 ENCOUNTER — Ambulatory Visit (INDEPENDENT_AMBULATORY_CARE_PROVIDER_SITE_OTHER): Payer: Medicare PPO | Admitting: Family Medicine

## 2023-06-03 ENCOUNTER — Encounter: Payer: Self-pay | Admitting: Family Medicine

## 2023-06-03 VITALS — BP 134/80 | HR 92 | Temp 98.4°F | Resp 18 | Ht 62.0 in | Wt 113.0 lb

## 2023-06-03 DIAGNOSIS — E785 Hyperlipidemia, unspecified: Secondary | ICD-10-CM

## 2023-06-03 DIAGNOSIS — Z0001 Encounter for general adult medical examination with abnormal findings: Secondary | ICD-10-CM

## 2023-06-03 DIAGNOSIS — E1169 Type 2 diabetes mellitus with other specified complication: Secondary | ICD-10-CM

## 2023-06-03 DIAGNOSIS — E119 Type 2 diabetes mellitus without complications: Secondary | ICD-10-CM

## 2023-06-03 DIAGNOSIS — M25531 Pain in right wrist: Secondary | ICD-10-CM

## 2023-06-03 DIAGNOSIS — Z0000 Encounter for general adult medical examination without abnormal findings: Secondary | ICD-10-CM

## 2023-06-03 NOTE — Progress Notes (Signed)
Established Patient Office Visit  Subjective   Patient ID: Isabellarose Shore, female    DOB: [birth year]  Age: 77 y.o. MRN: [id number]  Chief Complaint  Patient presents with   Annual Exam    Pt states fasting     HPI Discussed the use of AI scribe software for clinical note transcription with the patient, who gave verbal consent to proceed.  History of Present Illness   The patient, with a history of esophageal cancer status post esophagectomy, presents with severe right hand pain. The pain is constant, aching, and associated with swelling across the dorsum of the hand. The pain is exacerbated by touch and is not relieved by Tylenol. The patient reports that this pain has been intermittent for some time, but this episode has not resolved. She was recently evaluated in the ER where she received a three-day course of prednisone with minimal relief.  The patient also reports difficulty sleeping due to the pain. She sleeps elevated due to a history of reflux, which is well controlled with pantoprazole as long as she does not eat late in the day. She expresses concern about taking medications that may cause her to sleep too deeply and risk reflux.  The patient also mentions that she has regular eye injections for an unspecified condition and regular dental care. She has not had recent mammograms or bone density scans.      Patient Active Problem List   Diagnosis Date Noted   Wrist pain, acute, right 06/03/2023   Age-related osteoporosis without current pathological fracture 11/27/2021   Preventative health care 05/27/2021   Periorbital cellulitis of right eye 09/11/2019   Diet-controlled diabetes mellitus (HCC) 05/22/2019   Ductal carcinoma in situ (DCIS) of right breast 05/22/2019   Nipple lesion 11/21/2018   Controlled type 2 diabetes mellitus without complication, without long-term current use of insulin (HCC) 05/19/2018   Hyperlipidemia associated with type 2 diabetes mellitus (HCC)  05/19/2018   Vitamin D deficiency 05/19/2018   History of esophageal cancer 05/19/2018   Hematuria 04/08/2015   UTI (urinary tract infection) 04/04/2015   DM (diabetes mellitus) type II uncontrolled, periph vascular disorder 12/06/2014   Dysuria 01/25/2014   IFG (impaired fasting glucose) 01/25/2014   Unspecified vitamin D deficiency 05/30/2013   Encounter for therapeutic drug monitoring 05/30/2013   Impaired fasting glucose 05/30/2013   Other malaise and fatigue 05/30/2013   Hyperlipidemia 05/30/2013   GERD (gastroesophageal reflux disease) 05/30/2013   Need for prophylactic vaccination with combined diphtheria-tetanus-pertussis (DTP) vaccine 05/30/2013   Esophageal cancer (HCC)    Dysphagia 11/17/2011   Cancer of esophagus (HCC) 11/13/2011   Past Medical History:  Diagnosis Date   Breast cancer (HCC) 12/2018   right DCIS   Dysphagia    Esophageal cancer (HCC)    GERD (gastroesophageal reflux disease)    Hyperlipemia    Hypertension    no meds now   Impaired fasting blood sugar    Personal history of radiation therapy    PONV (postoperative nausea and vomiting)    Pre-diabetes    Past Surgical History:  Procedure Laterality Date   BREAST BIOPSY Right 12/23/2018   BREAST LUMPECTOMY Right 01/18/2019   BREAST LUMPECTOMY WITH RADIOACTIVE SEED LOCALIZATION Right 01/18/2019   Procedure: RIGHT BREAST CENTRAL LUMPECTOMY WITH RADIOACTIVE SEED LOCALIZATION;  Surgeon: Emelia Loron, MD;  Location:  SURGERY CENTER;  Service: General;  Laterality: Right;   DIRECT LARYNGOSCOPY WITH BOTOX INJECTION     tx paralysed vocal cord  ESOPHAGOGASTRODUODENOSCOPY  11/17/2011   Procedure: ESOPHAGOGASTRODUODENOSCOPY (EGD);  Surgeon: Rob Bunting, MD;  Location: [facility];  Service: Endoscopy;  Laterality: N/A;   ESOPHAGUS SURGERY     EYE SURGERY     both cataracts   GANGLION CYST EXCISION Left 01/26/2013   Procedure: LEFT FLEXOR CARPI RADIALIS  RELEASE AND SCAPHO TRAPEZIAL  TRAPEZOID DEBRIDEMENT ;  Surgeon: Tami Ribas, MD;  Location: [facility];  Service: Orthopedics;  Laterality: Left;   Social History   Tobacco Use   Smoking status: Former    Current packs/day: 0.00    Types: Cigarettes    Quit date: 08/30/1987    Years since quitting: 35.7   Smokeless tobacco: Never  Substance Use Topics   Alcohol use: No   Drug use: Never   Social History   Socioeconomic History   Marital status: Widowed    Spouse name: Dorene Sorrow   Number of children: 0   Years of education: 18   Highest education level: Not on file  Occupational History   Occupation: RETIRED     Employer: OTHER  Tobacco Use   Smoking status: Former    Current packs/day: 0.00    Types: Cigarettes    Quit date: 08/30/1987    Years since quitting: 35.7   Smokeless tobacco: Never  Substance and Sexual Activity   Alcohol use: No   Drug use: Never   Sexual activity: Yes    Partners: Male    Birth control/protection: Post-menopausal  Other Topics Concern   Not on file  Social History Narrative   Marital Status:  Married Dorene Sorrow)   Children:  None    Pets: Dog (1)    Living Situation: Lives with husband.   Occupation:  Retired Loss adjuster, chartered)    Education: Master's Degree    Tobacco Use/Exposure:  Formal Smoker    Alcohol Use:  Occasional   Drug Use:  None   Diet:  Regular   Exercise:  Walking 1 mile 5 times per week.    Hobbies:  Reading                Social Determinants of Health   Financial Resource Strain: Low Risk  (01/06/2022)   Overall Financial Resource Strain (CARDIA)    Difficulty of Paying Living Expenses: Not hard at all  Food Insecurity: No Food Insecurity (09/11/2022)   Hunger Vital Sign    Worried About Running Out of Food in the Last Year: Never true    Ran Out of Food in the Last Year: Never true  Transportation Needs: No Transportation Needs (09/11/2022)   PRAPARE - Administrator, Civil Service (Medical): No    Lack of Transportation  (Non-Medical): No  Physical Activity: Sufficiently Active (01/06/2022)   Exercise Vital Sign    Days of Exercise per Week: 7 days    Minutes of Exercise per Session: 60 min  Stress: No Stress Concern Present (12/19/2020)   [employer] of Occupational Health - Occupational Stress Questionnaire    Feeling of Stress : Not at all  Social Connections: Socially Isolated (01/06/2022)   Social Connection and Isolation Panel [NHANES]    Frequency of Communication with Friends and Family: More than three times a week    Frequency of Social Gatherings with Friends and Family: More than three times a week    Attends Religious Services: Never    Database administrator or Organizations: No    Attends Banker Meetings: Never  Marital Status: Widowed  Intimate Partner Violence: Not At Risk (01/11/2023)   Humiliation, Afraid, Rape, and Kick questionnaire    Fear of Current or Ex-Partner: No    Emotionally Abused: No    Physically Abused: No    Sexually Abused: No   Family Status  Relation Name Status   Sister  Alive   Mother  Deceased   Father  Deceased   Neg Hx  (Not Specified)  No partnership data on file   Family History  Problem Relation Age of Onset   Alcohol abuse Sister    AAA (abdominal aortic aneurysm) Mother    COPD Father    Colon cancer Neg Hx    Allergies  Allergen Reactions   Taxol [Paclitaxel] Hives and Itching   Tramadol     "Sweaty, shaky and faint"   Carboplatin Hives and Itching   Codeine    Compazine    Compazine  [Prochlorperazine Maleate] Other (See Comments)    Severe chest pain   Penicillins    Prochlorperazine Edisylate     Other reaction(s): Other (See Comments) Severe chest pain      Review of Systems  Constitutional:  Negative for fever and malaise/fatigue.  HENT:  Negative for congestion.   Eyes:  Negative for blurred vision.  Respiratory:  Negative for shortness of breath.   Cardiovascular:  Negative for chest pain, palpitations  and leg swelling.  Gastrointestinal:  Negative for abdominal pain, blood in stool and nausea.  Genitourinary:  Negative for dysuria and frequency.  Musculoskeletal:  Negative for falls.  Skin:  Negative for rash.  Neurological:  Negative for dizziness, loss of consciousness and headaches.  Endo/Heme/Allergies:  Negative for environmental allergies.  Psychiatric/Behavioral:  Negative for depression. The patient is not nervous/anxious.       Objective:     BP 134/80 (BP Location: Left Arm, Patient Position: Sitting, Cuff Size: Normal)   Pulse 92   Temp 98.4 F (36.9 C) (Oral)   Resp 18   Ht 5\' 2"  (1.575 m)   Wt 113 lb (51.3 kg)   SpO2 100%   BMI 20.67 kg/m  BP Readings from Last 3 Encounters:  06/03/23 134/80  05/22/23 (!) 152/98  11/30/22 118/80   Wt Readings from Last 3 Encounters:  06/03/23 113 lb (51.3 kg)  05/22/23 116 lb 13.5 oz (53 kg)  11/30/22 116 lb 12.8 oz (53 kg)   SpO2 Readings from Last 3 Encounters:  06/03/23 100%  05/22/23 98%  11/30/22 100%      Physical Exam Vitals and nursing note reviewed.  Constitutional:      General: She is not in acute distress.    Appearance: Normal appearance. She is well-developed.  HENT:     Head: Normocephalic and atraumatic.     Right Ear: Tympanic membrane, ear canal and external ear normal. There is no impacted cerumen.     Left Ear: Tympanic membrane, ear canal and external ear normal. There is no impacted cerumen.     Nose: Nose normal.     Mouth/Throat:     Mouth: Mucous membranes are moist.     Pharynx: Oropharynx is clear. No oropharyngeal exudate or posterior oropharyngeal erythema.  Eyes:     General: No scleral icterus.       Right eye: No discharge.        Left eye: No discharge.     Conjunctiva/sclera: Conjunctivae normal.     Pupils: Pupils are equal, round, and reactive to light.  Neck:     Thyroid: No thyromegaly or thyroid tenderness.     Vascular: No JVD.  Cardiovascular:     Rate and Rhythm:  Normal rate and regular rhythm.     Heart sounds: Normal heart sounds. No murmur heard. Pulmonary:     Effort: Pulmonary effort is normal. No respiratory distress.     Breath sounds: Normal breath sounds.  Abdominal:     General: Bowel sounds are normal. There is no distension.     Palpations: Abdomen is soft. There is no mass.     Tenderness: There is no abdominal tenderness. There is no guarding or rebound.  Genitourinary:    Vagina: Normal.  Musculoskeletal:        General: Swelling and tenderness present.     Right wrist: Swelling and tenderness present. Decreased range of motion.     Left wrist: Normal.     Cervical back: Normal range of motion and neck supple.     Right lower leg: No edema.     Left lower leg: No edema.  Lymphadenopathy:     Cervical: No cervical adenopathy.  Skin:    General: Skin is warm and dry.     Findings: No erythema or rash.  Neurological:     General: No focal deficit present.     Mental Status: She is alert and oriented to person, place, and time.     Cranial Nerves: No cranial nerve deficit.     Deep Tendon Reflexes: Reflexes are normal and symmetric.  Psychiatric:        Mood and Affect: Mood normal.        Behavior: Behavior normal.        Thought Content: Thought content normal.        Judgment: Judgment normal.      No results found for any visits on 06/03/23.  Last CBC Lab Results  Component Value Date   WBC 6.0 05/27/2023   HGB 12.7 05/27/2023   HCT 38.9 05/27/2023   MCV 93.7 05/27/2023   MCH 28.6 04/24/2013   RDW 15.0 05/27/2023   PLT 265.0 05/27/2023   Last metabolic panel Lab Results  Component Value Date   GLUCOSE 116 (H) 05/27/2023   NA 139 05/27/2023   K 4.1 05/27/2023   CL 102 05/27/2023   CO2 30 05/27/2023   BUN 21 05/27/2023   CREATININE 0.99 05/27/2023   GFR 54.57 (L) 05/27/2023   CALCIUM 9.6 05/27/2023   PROT 6.9 05/27/2023   ALBUMIN 4.0 05/27/2023   BILITOT 0.6 05/27/2023   ALKPHOS 52 05/27/2023    AST 21 05/27/2023   ALT 16 05/27/2023   Last lipids Lab Results  Component Value Date   CHOL 193 05/27/2023   HDL 70.00 05/27/2023   LDLCALC 93 05/27/2023   TRIG 150.0 (H) 05/27/2023   CHOLHDL 3 05/27/2023   Last hemoglobin A1c Lab Results  Component Value Date   HGBA1C 6.9 (H) 05/27/2023   Last thyroid functions Lab Results  Component Value Date   TSH 2.147 04/24/2013   Last vitamin D Lab Results  Component Value Date   VD25OH 66.43 11/27/2021   Last vitamin B12 and Folate No results found for: "VITAMINB12", "FOLATE"    The 10-year ASCVD risk score (Arnett DK, et al., 2019) is: 41.7%    Assessment & Plan:   Problem List Items Addressed This Visit       Unprioritized   Wrist pain, acute, right    Con't to wear  splint Refer to ortho      Relevant Orders   Ambulatory referral to Orthopedics   Preventative health care - Primary    Ghm utd Check labs  See AVS Health Maintenance  Topic Date Due   DTaP/Tdap/Td (2 - Td or Tdap) 05/02/2023   INFLUENZA VACCINE  06/03/2023   COVID-19 Vaccine (7 - 2023-24 season) 06/19/2023 (Originally 10/13/2022)   MAMMOGRAM  06/02/2024 (Originally 03/18/2022)   HEMOGLOBIN A1C  11/27/2023   Medicare Annual Wellness (AWV)  01/11/2024   OPHTHALMOLOGY EXAM  04/25/2024   Diabetic kidney evaluation - eGFR measurement  05/26/2024   Diabetic kidney evaluation - Urine ACR  05/26/2024   FOOT EXAM  06/02/2024   Pneumonia Vaccine 15+ Years old  Completed   DEXA SCAN  Completed   Hepatitis C Screening  Completed   Zoster Vaccines- Shingrix  Completed   HPV VACCINES  Aged Out   Colonoscopy  Discontinued         Hyperlipidemia associated with type 2 diabetes mellitus (HCC)    Tolerating statin, encouraged heart healthy diet, avoid trans fats, minimize simple carbs and saturated fats. Increase exercise as tolerated       Hyperlipidemia    Tolerating statin, encouraged heart healthy diet, avoid trans fats, minimize simple carbs and  saturated fats. Increase exercise as tolerated       Diet-controlled diabetes mellitus (HCC)    hgba1c acceptable, minimize simple carbs. Increase exercise as tolerated. Continue current meds      Assessment and Plan    Wrist Pain Chronic, intermittent wrist pain that has recently worsened with associated swelling. X-ray showed no significant arthritis. Prednisone provided minimal relief. -Referral to Dr. Amanda Pea at [facility] for further evaluation and management. -Advised patient to monitor blood sugar if steroid injection is given for pain relief.  Gastroesophageal Reflux Disease (GERD) Patient reports needing to sleep elevated due to reflux symptoms. Currently on Pantoprazole. -Continue Pantoprazole as prescribed.  General Health Maintenance -Overdue for tetanus shot. Advised to get at pharmacy. -Flu shot due when available.  Follow-up -Check patient portal for referral information. -Return to clinic as needed.        Return in about 6 months (around 12/04/2023), or if symptoms worsen or fail to improve.    Donato Schultz, DO

## 2023-06-03 NOTE — Assessment & Plan Note (Signed)
Tolerating statin, encouraged heart healthy diet, avoid trans fats, minimize simple carbs and saturated fats. Increase exercise as tolerated 

## 2023-06-03 NOTE — Telephone Encounter (Signed)
Patient would like labs added because would like labs drawn in Jan prior to next appt.

## 2023-06-03 NOTE — Assessment & Plan Note (Signed)
hgba1c acceptable, minimize simple carbs. Increase exercise as tolerated. Continue current meds 

## 2023-06-03 NOTE — Patient Instructions (Signed)
Preventive Care 65 Years and Older, Female Preventive care refers to lifestyle choices and visits with your health care provider that can promote health and wellness. Preventive care visits are also called wellness exams. What can I expect for my preventive care visit? Counseling Your health care provider may ask you questions about your: Medical history, including: Past medical problems. Family medical history. Pregnancy and menstrual history. History of falls. Current health, including: Memory and ability to understand (cognition). Emotional well-being. Home life and relationship well-being. Sexual activity and sexual health. Lifestyle, including: Alcohol, nicotine or tobacco, and drug use. Access to firearms. Diet, exercise, and sleep habits. Work and work environment. Sunscreen use. Safety issues such as seatbelt and bike helmet use. Physical exam Your health care provider will check your: Height and weight. These may be used to calculate your BMI (body mass index). BMI is a measurement that tells if you are at a healthy weight. Waist circumference. This measures the distance around your waistline. This measurement also tells if you are at a healthy weight and may help predict your risk of certain diseases, such as type 2 diabetes and high blood pressure. Heart rate and blood pressure. Body temperature. Skin for abnormal spots. What immunizations do I need?  Vaccines are usually given at various ages, according to a schedule. Your health care provider will recommend vaccines for you based on your age, medical history, and lifestyle or other factors, such as travel or where you work. What tests do I need? Screening Your health care provider may recommend screening tests for certain conditions. This may include: Lipid and cholesterol levels. Hepatitis C test. Hepatitis B test. HIV (human immunodeficiency virus) test. STI (sexually transmitted infection) testing, if you are at  risk. Lung cancer screening. Colorectal cancer screening. Diabetes screening. This is done by checking your blood sugar (glucose) after you have not eaten for a while (fasting). Mammogram. Talk with your health care provider about how often you should have regular mammograms. BRCA-related cancer screening. This may be done if you have a family history of breast, ovarian, tubal, or peritoneal cancers. Bone density scan. This is done to screen for osteoporosis. Talk with your health care provider about your test results, treatment options, and if necessary, the need for more tests. Follow these instructions at home: Eating and drinking  Eat a diet that includes fresh fruits and vegetables, whole grains, lean protein, and low-fat dairy products. Limit your intake of foods with high amounts of sugar, saturated fats, and salt. Take vitamin and mineral supplements as recommended by your health care provider. Do not drink alcohol if your health care provider tells you not to drink. If you drink alcohol: Limit how much you have to 0-1 drink a day. Know how much alcohol is in your drink. In the [place] one drink equals one 12 oz bottle of beer (355 mL), one 5 oz glass of wine (148 mL), or one 1 oz glass of hard liquor (44 mL). Lifestyle Brush your teeth every morning and night with fluoride toothpaste. Floss one time each day. Exercise for at least 30 minutes 5 or more days each week. Do not use any products that contain nicotine or tobacco. These products include cigarettes, chewing tobacco, and vaping devices, such as e-cigarettes. If you need help quitting, ask your health care provider. Do not use drugs. If you are sexually active, practice safe sex. Use a condom or other form of protection in order to prevent STIs. Take aspirin only as told by   your health care provider. Make sure that you understand how much to take and what form to take. Work with your health care provider to find out whether it  is safe and beneficial for you to take aspirin daily. Ask your health care provider if you need to take a cholesterol-lowering medicine (statin). Find healthy ways to manage stress, such as: Meditation, yoga, or listening to music. Journaling. Talking to a trusted person. Spending time with friends and family. Minimize exposure to UV radiation to reduce your risk of skin cancer. Safety Always wear your seat belt while driving or riding in a vehicle. Do not drive: If you have been drinking alcohol. Do not ride with someone who has been drinking. When you are tired or distracted. While texting. If you have been using any mind-altering substances or drugs. Wear a helmet and other protective equipment during sports activities. If you have firearms in your house, make sure you follow all gun safety procedures. What's next? Visit your health care provider once a year for an annual wellness visit. Ask your health care provider how often you should have your eyes and teeth checked. Stay up to date on all vaccines. This information is not intended to replace advice given to you by your health care provider. Make sure you discuss any questions you have with your health care provider. Document Revised: 04/16/2021 Document Reviewed: 04/16/2021 Elsevier Patient Education  2024 Elsevier Inc.  

## 2023-06-03 NOTE — Assessment & Plan Note (Signed)
Con't to wear splint Refer to ortho

## 2023-06-03 NOTE — Assessment & Plan Note (Signed)
Ghm utd Check labs  See AVS Health Maintenance  Topic Date Due   DTaP/Tdap/Td (2 - Td or Tdap) 05/02/2023   INFLUENZA VACCINE  06/03/2023   COVID-19 Vaccine (7 - 2023-24 season) 06/19/2023 (Originally 10/13/2022)   MAMMOGRAM  06/02/2024 (Originally 03/18/2022)   HEMOGLOBIN A1C  11/27/2023   Medicare Annual Wellness (AWV)  01/11/2024   OPHTHALMOLOGY EXAM  04/25/2024   Diabetic kidney evaluation - eGFR measurement  05/26/2024   Diabetic kidney evaluation - Urine ACR  05/26/2024   FOOT EXAM  06/02/2024   Pneumonia Vaccine 64+ Years old  Completed   DEXA SCAN  Completed   Hepatitis C Screening  Completed   Zoster Vaccines- Shingrix  Completed   HPV VACCINES  Aged Out   Colonoscopy  Discontinued

## 2023-06-04 DIAGNOSIS — M13831 Other specified arthritis, right wrist: Secondary | ICD-10-CM | POA: Diagnosis not present

## 2023-06-04 DIAGNOSIS — G5601 Carpal tunnel syndrome, right upper limb: Secondary | ICD-10-CM | POA: Diagnosis not present

## 2023-06-18 DIAGNOSIS — M13831 Other specified arthritis, right wrist: Secondary | ICD-10-CM | POA: Diagnosis not present

## 2023-06-18 DIAGNOSIS — G5601 Carpal tunnel syndrome, right upper limb: Secondary | ICD-10-CM | POA: Diagnosis not present

## 2023-06-25 DIAGNOSIS — M25531 Pain in right wrist: Secondary | ICD-10-CM | POA: Diagnosis not present

## 2023-06-30 ENCOUNTER — Other Ambulatory Visit: Payer: Self-pay | Admitting: Family Medicine

## 2023-06-30 DIAGNOSIS — C159 Malignant neoplasm of esophagus, unspecified: Secondary | ICD-10-CM

## 2023-07-01 DIAGNOSIS — M25531 Pain in right wrist: Secondary | ICD-10-CM | POA: Diagnosis not present

## 2023-07-08 DIAGNOSIS — M25531 Pain in right wrist: Secondary | ICD-10-CM | POA: Diagnosis not present

## 2023-07-15 DIAGNOSIS — M25531 Pain in right wrist: Secondary | ICD-10-CM | POA: Diagnosis not present

## 2023-07-16 ENCOUNTER — Ambulatory Visit (INDEPENDENT_AMBULATORY_CARE_PROVIDER_SITE_OTHER): Payer: Medicare PPO | Admitting: Family Medicine

## 2023-07-16 ENCOUNTER — Encounter: Payer: Self-pay | Admitting: Family Medicine

## 2023-07-16 ENCOUNTER — Ambulatory Visit (HOSPITAL_BASED_OUTPATIENT_CLINIC_OR_DEPARTMENT_OTHER)
Admission: RE | Admit: 2023-07-16 | Discharge: 2023-07-16 | Disposition: A | Discharge status: Home / Self Care | Payer: Medicare PPO | Source: Ambulatory Visit | Attending: Family Medicine | Admitting: Family Medicine

## 2023-07-16 VITALS — BP 144/86 | HR 101 | Temp 98.0°F | Resp 16 | Ht 62.0 in | Wt 112.6 lb

## 2023-07-16 DIAGNOSIS — R0602 Shortness of breath: Secondary | ICD-10-CM | POA: Insufficient documentation

## 2023-07-16 DIAGNOSIS — E119 Type 2 diabetes mellitus without complications: Secondary | ICD-10-CM | POA: Diagnosis not present

## 2023-07-16 DIAGNOSIS — R002 Palpitations: Secondary | ICD-10-CM | POA: Diagnosis not present

## 2023-07-16 DIAGNOSIS — R918 Other nonspecific abnormal finding of lung field: Secondary | ICD-10-CM | POA: Diagnosis not present

## 2023-07-16 DIAGNOSIS — C159 Malignant neoplasm of esophagus, unspecified: Secondary | ICD-10-CM | POA: Diagnosis not present

## 2023-07-16 LAB — COMPREHENSIVE METABOLIC PANEL
ALT: 14 U/L (ref 0–35)
AST: 21 U/L (ref 0–37)
Albumin: 4.1 g/dL (ref 3.5–5.2)
Alkaline Phosphatase: 57 U/L (ref 39–117)
BUN: 15 mg/dL (ref 6–23)
CO2: 29 meq/L (ref 19–32)
Calcium: 9.7 mg/dL (ref 8.4–10.5)
Chloride: 100 meq/L (ref 96–112)
Creatinine, Ser: 0.97 mg/dL (ref 0.40–1.20)
GFR: 55.87 mL/min — ABNORMAL LOW (ref >60.00)
Glucose, Bld: 147 mg/dL — ABNORMAL HIGH (ref 70–99)
Potassium: 4 meq/L (ref 3.5–5.1)
Sodium: 137 meq/L (ref 135–145)
Total Bilirubin: 0.7 mg/dL (ref 0.2–1.2)
Total Protein: 7.2 g/dL (ref 6.0–8.3)

## 2023-07-16 LAB — CBC WITH DIFFERENTIAL/PLATELET
Basophils Absolute: 0 10*3/uL (ref 0.0–0.1)
Basophils Relative: 0.5 % (ref 0.0–3.0)
Eosinophils Absolute: 0.1 10*3/uL (ref 0.0–0.7)
Eosinophils Relative: 1.6 % (ref 0.0–5.0)
HCT: 40.5 % (ref 36.0–46.0)
Hemoglobin: 13.2 g/dL (ref 12.0–15.0)
Lymphocytes Relative: 16.9 % (ref 12.0–46.0)
Lymphs Abs: 1.5 10*3/uL (ref 0.7–4.0)
MCHC: 32.5 g/dL (ref 30.0–36.0)
MCV: 93.4 fl (ref 78.0–100.0)
Monocytes Absolute: 0.8 10*3/uL (ref 0.1–1.0)
Monocytes Relative: 9.7 % (ref 3.0–12.0)
Neutro Abs: 6.2 10*3/uL (ref 1.4–7.7)
Neutrophils Relative %: 71.3 % (ref 43.0–77.0)
Platelets: 236 10*3/uL (ref 150.0–400.0)
RBC: 4.34 Mil/uL (ref 3.87–5.11)
RDW: 14.7 % (ref 11.5–15.5)
WBC: 8.7 10*3/uL (ref 4.0–10.5)

## 2023-07-16 LAB — HEMOGLOBIN A1C: Hgb A1c MFr Bld: 7.1 % — ABNORMAL HIGH (ref 4.6–6.5)

## 2023-07-16 LAB — VITAMIN B12: Vitamin B-12: 1342 pg/mL — ABNORMAL HIGH (ref 211–911)

## 2023-07-16 LAB — TSH: TSH: 1.66 u[IU]/mL (ref 0.35–5.50)

## 2023-07-16 NOTE — Assessment & Plan Note (Addendum)
EKG normal  Event monitor Check labs  ?anxiety

## 2023-07-16 NOTE — Progress Notes (Signed)
Established Patient Office Visit  Subjective   Patient ID: Briana Craig, female    DOB: Sep 12, 1946  Age: 77 y.o. MRN: [id number]  Chief Complaint  Patient presents with   Dizziness    Dizziness and shortness of breath      HPI Discussed the use of AI scribe software for clinical note transcription with the patient, who gave verbal consent to proceed.  History of Present Illness   The patient, with a history of wrist pain managed with cortisone injections and oral prednisone, presents with a buzzing sensation in her head and occasional shortness of breath. These symptoms have been ongoing for a couple of weeks and are not associated with chest pain or dizziness. The patient describes the sensation as not spinning but more of a lightheadedness that resolves when she closes her eyes or chop their arms. The episodes are not consistently associated with exertion and can occur at rest. The patient has noticed that her pulse feels slow during these episodes but has not quantified it. She denies palpitations. The patient has also been experiencing anxiety related to current events for the past two years, which she manages by limiting exposure to news media. She denies needing medication for anxiety.      Patient Active Problem List   Diagnosis Date Noted   SOB (shortness of breath) 07/16/2023   Palpitation 07/16/2023   Wrist pain, acute, right 06/03/2023   Age-related osteoporosis without current pathological fracture 11/27/2021   Preventative health care 05/27/2021   Periorbital cellulitis of right eye 09/11/2019   Diet-controlled diabetes mellitus (HCC) 05/22/2019   Ductal carcinoma in situ (DCIS) of right breast 05/22/2019   Nipple lesion 11/21/2018   Controlled type 2 diabetes mellitus without complication, without long-term current use of insulin (HCC) 05/19/2018   Hyperlipidemia associated with type 2 diabetes mellitus (HCC) 05/19/2018   Vitamin D deficiency 05/19/2018   History  of esophageal cancer 05/19/2018   Hematuria 04/08/2015   UTI (urinary tract infection) 04/04/2015   DM (diabetes mellitus) type II uncontrolled, periph vascular disorder 12/06/2014   Dysuria 01/25/2014   IFG (impaired fasting glucose) 01/25/2014   Unspecified vitamin D deficiency 05/30/2013   Encounter for therapeutic drug monitoring 05/30/2013   Impaired fasting glucose 05/30/2013   Other malaise and fatigue 05/30/2013   Hyperlipidemia 05/30/2013   GERD (gastroesophageal reflux disease) 05/30/2013   Need for prophylactic vaccination with combined diphtheria-tetanus-pertussis (DTP) vaccine 05/30/2013   Esophageal cancer (HCC)    Dysphagia 11/17/2011   Cancer of esophagus (HCC) 11/13/2011   Past Medical History:  Diagnosis Date   Breast cancer (HCC) 12/2018   right DCIS   Dysphagia    Esophageal cancer (HCC)    GERD (gastroesophageal reflux disease)    Hyperlipemia    Hypertension    no meds now   Impaired fasting blood sugar    Personal history of radiation therapy    PONV (postoperative nausea and vomiting)    Pre-diabetes    Past Surgical History:  Procedure Laterality Date   BREAST BIOPSY Right 12/23/2018   BREAST LUMPECTOMY Right 01/18/2019   BREAST LUMPECTOMY WITH RADIOACTIVE SEED LOCALIZATION Right 01/18/2019   Procedure: RIGHT BREAST CENTRAL LUMPECTOMY WITH RADIOACTIVE SEED LOCALIZATION;  Surgeon: Emelia Loron, MD;  Location: [facility];  Service: General;  Laterality: Right;   DIRECT LARYNGOSCOPY WITH BOTOX INJECTION     tx paralysed vocal cord   ESOPHAGOGASTRODUODENOSCOPY  11/17/2011   Procedure: ESOPHAGOGASTRODUODENOSCOPY (EGD);  Surgeon: Rob Bunting, MD;  Location:  [facility];  Service: Endoscopy;  Laterality: N/A;   ESOPHAGUS SURGERY     EYE SURGERY     both cataracts   GANGLION CYST EXCISION Left 01/26/2013   Procedure: LEFT FLEXOR CARPI RADIALIS  RELEASE AND SCAPHO TRAPEZIAL TRAPEZOID DEBRIDEMENT ;  Surgeon: Tami Ribas, MD;   Location: [facility];  Service: Orthopedics;  Laterality: Left;   Social History   Tobacco Use   Smoking status: Former    Current packs/day: 0.00    Types: Cigarettes    Quit date: 08/30/1987    Years since quitting: 35.9   Smokeless tobacco: Never  Substance Use Topics   Alcohol use: No   Drug use: Never   Social History   Socioeconomic History   Marital status: Widowed    Spouse name: Dorene Sorrow   Number of children: 0   Years of education: 18   Highest education level: Not on file  Occupational History   Occupation: RETIRED     Employer: OTHER  Tobacco Use   Smoking status: Former    Current packs/day: 0.00    Types: Cigarettes    Quit date: 08/30/1987    Years since quitting: 35.9   Smokeless tobacco: Never  Substance and Sexual Activity   Alcohol use: No   Drug use: Never   Sexual activity: Yes    Partners: Male    Birth control/protection: Post-menopausal  Other Topics Concern   Not on file  Social History Narrative   Marital Status:  Married Dorene Sorrow)   Children:  None    Pets: Dog (1)    Living Situation: Lives with husband.   Occupation:  Retired Loss adjuster, chartered)    Education: Master's Degree    Tobacco Use/Exposure:  Formal Smoker    Alcohol Use:  Occasional   Drug Use:  None   Diet:  Regular   Exercise:  Walking 1 mile 5 times per week.    Hobbies:  Reading                Social Determinants of Health   Financial Resource Strain: Low Risk  (01/06/2022)   Overall Financial Resource Strain (CARDIA)    Difficulty of Paying Living Expenses: Not hard at all  Food Insecurity: No Food Insecurity (09/11/2022)   Hunger Vital Sign    Worried About Running Out of Food in the Last Year: Never true    Ran Out of Food in the Last Year: Never true  Transportation Needs: No Transportation Needs (09/11/2022)   PRAPARE - Administrator, Civil Service (Medical): No    Lack of Transportation (Non-Medical): No  Physical Activity: Sufficiently  Active (01/06/2022)   Exercise Vital Sign    Days of Exercise per Week: 7 days    Minutes of Exercise per Session: 60 min  Stress: No Stress Concern Present (12/19/2020)   [employer] of Occupational Health - Occupational Stress Questionnaire    Feeling of Stress : Not at all  Social Connections: Socially Isolated (01/06/2022)   Social Connection and Isolation Panel [NHANES]    Frequency of Communication with Friends and Family: More than three times a week    Frequency of Social Gatherings with Friends and Family: More than three times a week    Attends Religious Services: Never    Database administrator or Organizations: No    Attends Banker Meetings: Never    Marital Status: Widowed  Intimate Partner Violence: Not At Risk (01/11/2023)   Humiliation,  Afraid, Rape, and Kick questionnaire    Fear of Current or Ex-Partner: No    Emotionally Abused: No    Physically Abused: No    Sexually Abused: No   Family Status  Relation Name Status   Sister  Alive   Mother  Deceased   Father  Deceased   Neg Hx  (Not Specified)  No partnership data on file   Family History  Problem Relation Age of Onset   Alcohol abuse Sister    AAA (abdominal aortic aneurysm) Mother    COPD Father    Colon cancer Neg Hx    Allergies  Allergen Reactions   Taxol [Paclitaxel] Hives and Itching   Tramadol     "Sweaty, shaky and faint"   Carboplatin Hives and Itching   Codeine    Compazine    Compazine  [Prochlorperazine Maleate] Other (See Comments)    Severe chest pain   Penicillins    Prochlorperazine Edisylate     Other reaction(s): Other (See Comments) Severe chest pain      Review of Systems  Constitutional:  Negative for fever and malaise/fatigue.  HENT:  Negative for congestion.   Eyes:  Negative for blurred vision.  Respiratory:  Positive for shortness of breath. Negative for cough.   Cardiovascular:  Positive for palpitations. Negative for chest pain and leg swelling.   Gastrointestinal:  Negative for vomiting.  Musculoskeletal:  Negative for back pain.  Skin:  Negative for rash.  Neurological:  Positive for dizziness. Negative for loss of consciousness and headaches.  Psychiatric/Behavioral:  Negative for depression, hallucinations, memory loss, substance abuse and suicidal ideas. The patient is nervous/anxious.       Objective:     BP (!) 144/86 (BP Location: Right Leg, Patient Position: Sitting, Cuff Size: Small)   Pulse (!) 101   Temp 98 F (36.7 C) (Oral)   Resp 16   Ht 5\' 2"  (1.575 m)   Wt 112 lb 9.6 oz (51.1 kg)   SpO2 96%   BMI 20.59 kg/m  BP Readings from Last 3 Encounters:  07/16/23 (!) 144/86  06/03/23 134/80  05/22/23 (!) 152/98   Wt Readings from Last 3 Encounters:  07/16/23 112 lb 9.6 oz (51.1 kg)  06/03/23 113 lb (51.3 kg)  05/22/23 116 lb 13.5 oz (53 kg)   SpO2 Readings from Last 3 Encounters:  07/16/23 96%  06/03/23 100%  05/22/23 98%      Physical Exam Vitals and nursing note reviewed.  Constitutional:      General: She is not in acute distress.    Appearance: Normal appearance. She is well-developed.  HENT:     Head: Normocephalic and atraumatic.  Eyes:     General: No scleral icterus.       Right eye: No discharge.        Left eye: No discharge.  Cardiovascular:     Rate and Rhythm: Normal rate and regular rhythm.     Heart sounds: No murmur heard. Pulmonary:     Effort: Pulmonary effort is normal. No respiratory distress.     Breath sounds: Normal breath sounds.  Musculoskeletal:        General: Normal range of motion.     Cervical back: Normal range of motion and neck supple.     Right lower leg: No edema.     Left lower leg: No edema.  Skin:    General: Skin is warm and dry.  Neurological:     General: No focal  deficit present.     Mental Status: She is alert and oriented to person, place, and time.     Motor: No weakness.     Gait: Gait normal.  Psychiatric:        Mood and Affect: Mood  normal.        Behavior: Behavior normal.        Thought Content: Thought content normal.        Judgment: Judgment normal.      No results found for any visits on 07/16/23.  Last CBC Lab Results  Component Value Date   WBC 6.0 05/27/2023   HGB 12.7 05/27/2023   HCT 38.9 05/27/2023   MCV 93.7 05/27/2023   MCH 28.6 04/24/2013   RDW 15.0 05/27/2023   PLT 265.0 05/27/2023   Last metabolic panel Lab Results  Component Value Date   GLUCOSE 116 (H) 05/27/2023   NA 139 05/27/2023   K 4.1 05/27/2023   CL 102 05/27/2023   CO2 30 05/27/2023   BUN 21 05/27/2023   CREATININE 0.99 05/27/2023   GFR 54.57 (L) 05/27/2023   CALCIUM 9.6 05/27/2023   PROT 6.9 05/27/2023   ALBUMIN 4.0 05/27/2023   BILITOT 0.6 05/27/2023   ALKPHOS 52 05/27/2023   AST 21 05/27/2023   ALT 16 05/27/2023   Last lipids Lab Results  Component Value Date   CHOL 193 05/27/2023   HDL 70.00 05/27/2023   LDLCALC 93 05/27/2023   TRIG 150.0 (H) 05/27/2023   CHOLHDL 3 05/27/2023   Last hemoglobin A1c Lab Results  Component Value Date   HGBA1C 6.9 (H) 05/27/2023   Last thyroid functions Lab Results  Component Value Date   TSH 2.147 04/24/2013   Last vitamin D Lab Results  Component Value Date   VD25OH 66.43 11/27/2021   Last vitamin B12 and Folate No results found for: "VITAMINB12", "FOLATE"   EKG---  nsr  The 10-year ASCVD risk score (Arnett DK, et al., 2019) is: 46.3%    Assessment & Plan:   Problem List Items Addressed This Visit       Unprioritized   Diet-controlled diabetes mellitus (HCC)   Relevant Orders   CBC with Differential/Platelet   Comprehensive metabolic panel   Hemoglobin A1c   SOB (shortness of breath) - Primary    Shortness of breath---cxr EKG normal Echo       Relevant Orders   EKG 12-Lead (Completed)   ECHOCARDIOGRAM COMPLETE   Vitamin B12   TSH   DG Chest 2 View   Palpitation    EKG normal  Event monitor Check labs  ?anxiety       Relevant Orders    Cardiac event monitor   Vitamin B12   TSH   DG Chest 2 View  Assessment and Plan    Episodes of lightheadedness and shortness of breath Symptoms have been present for a few weeks, sometimes associated with exertion. Patient describes a "buzzing" sensation in the head. No chest pain. EKG normal. Patient reports slow pulse during episodes. Possible anxiety component. -Order labs to check for metabolic causes. -Order chest x-ray. -Schedule echocardiogram to assess cardiac function. -Arrange for patient to wear a heart monitor to capture any arrhythmias during symptomatic episodes. -Advise patient to go to the emergency room if symptoms worsen or persist.  Recent steroid injection for wrist pain Patient received cortisone injections in the wrist about a month ago. No current symptoms related to this. -No specific plan needed at this time.  No follow-ups on file.    Donato Schultz, DO

## 2023-07-16 NOTE — Assessment & Plan Note (Signed)
Shortness of breath---cxr EKG normal Echo

## 2023-07-19 ENCOUNTER — Telehealth: Payer: Self-pay | Admitting: Family Medicine

## 2023-07-19 ENCOUNTER — Other Ambulatory Visit: Payer: Self-pay | Admitting: Family Medicine

## 2023-07-19 DIAGNOSIS — E785 Hyperlipidemia, unspecified: Secondary | ICD-10-CM

## 2023-07-19 NOTE — Telephone Encounter (Signed)
Pt would like to discuss her most recent lab results via telephone. Please call and advise.

## 2023-07-19 NOTE — Telephone Encounter (Signed)
Spoke with patient. Reviewed labs. Pt states not taking B12 supplements or shots. Pt states mult vitamin only has a 50 mcg of B12

## 2023-08-03 ENCOUNTER — Other Ambulatory Visit: Payer: Self-pay | Admitting: Family Medicine

## 2023-08-03 DIAGNOSIS — R0602 Shortness of breath: Secondary | ICD-10-CM

## 2023-08-05 DIAGNOSIS — H401131 Primary open-angle glaucoma, bilateral, mild stage: Secondary | ICD-10-CM | POA: Diagnosis not present

## 2023-08-08 ENCOUNTER — Other Ambulatory Visit: Payer: Self-pay | Admitting: Family Medicine

## 2023-08-08 DIAGNOSIS — E785 Hyperlipidemia, unspecified: Secondary | ICD-10-CM

## 2023-08-08 DIAGNOSIS — K219 Gastro-esophageal reflux disease without esophagitis: Secondary | ICD-10-CM

## 2023-08-13 ENCOUNTER — Ambulatory Visit (HOSPITAL_BASED_OUTPATIENT_CLINIC_OR_DEPARTMENT_OTHER)
Admission: RE | Admit: 2023-08-13 | Discharge: 2023-08-13 | Disposition: A | Discharge status: Home / Self Care | Payer: Medicare PPO | Source: Ambulatory Visit | Attending: Family Medicine | Admitting: Family Medicine

## 2023-08-13 ENCOUNTER — Other Ambulatory Visit (HOSPITAL_BASED_OUTPATIENT_CLINIC_OR_DEPARTMENT_OTHER): Payer: Self-pay

## 2023-08-13 DIAGNOSIS — I771 Stricture of artery: Secondary | ICD-10-CM | POA: Diagnosis not present

## 2023-08-13 DIAGNOSIS — R0602 Shortness of breath: Secondary | ICD-10-CM | POA: Insufficient documentation

## 2023-08-13 DIAGNOSIS — R918 Other nonspecific abnormal finding of lung field: Secondary | ICD-10-CM | POA: Diagnosis not present

## 2023-08-13 MED ORDER — COVID-19 MRNA VAC-TRIS(PFIZER) 30 MCG/0.3ML IM SUSY
0.3000 mL | PREFILLED_SYRINGE | Freq: Once | INTRAMUSCULAR | 0 refills | Status: AC
  Filled 2023-08-13: qty 0.3, 1d supply, fill #0

## 2023-08-13 MED ORDER — INFLUENZA VAC A&B SURF ANT ADJ 0.5 ML IM SUSY
0.5000 mL | PREFILLED_SYRINGE | Freq: Once | INTRAMUSCULAR | 0 refills | Status: AC
  Filled 2023-08-13: qty 0.5, 1d supply, fill #0

## 2023-08-17 ENCOUNTER — Ambulatory Visit (HOSPITAL_BASED_OUTPATIENT_CLINIC_OR_DEPARTMENT_OTHER)
Admission: RE | Admit: 2023-08-17 | Discharge: 2023-08-17 | Disposition: A | Discharge status: Home / Self Care | Payer: Medicare PPO | Source: Ambulatory Visit | Attending: Family Medicine | Admitting: Family Medicine

## 2023-08-17 DIAGNOSIS — R0602 Shortness of breath: Secondary | ICD-10-CM | POA: Insufficient documentation

## 2023-08-17 LAB — ECHOCARDIOGRAM COMPLETE
AR max vel: 1.89 cm2
AV Area VTI: 1.81 cm2
AV Area mean vel: 1.92 cm2
AV Mean grad: 4 mm[Hg]
AV Peak grad: 6.8 mm[Hg]
AV Vena cont: 0.1 cm
Ao pk vel: 1.3 m/s
Area-P 1/2: 2.84 cm2
Calc EF: 69.8 %
S' Lateral: 1.7 cm
Single Plane A2C EF: 66.6 %
Single Plane A4C EF: 71.5 %

## 2023-08-20 DIAGNOSIS — H353211 Exudative age-related macular degeneration, right eye, with active choroidal neovascularization: Secondary | ICD-10-CM | POA: Diagnosis not present

## 2023-08-24 ENCOUNTER — Ambulatory Visit (INDEPENDENT_AMBULATORY_CARE_PROVIDER_SITE_OTHER): Payer: Medicare PPO

## 2023-08-24 ENCOUNTER — Ambulatory Visit (INDEPENDENT_AMBULATORY_CARE_PROVIDER_SITE_OTHER): Payer: Medicare PPO | Admitting: Family Medicine

## 2023-08-24 ENCOUNTER — Telehealth: Payer: Self-pay | Admitting: Family Medicine

## 2023-08-24 ENCOUNTER — Encounter: Payer: Self-pay | Admitting: Family Medicine

## 2023-08-24 ENCOUNTER — Ambulatory Visit
Admission: RE | Admit: 2023-08-24 | Discharge: 2023-08-24 | Disposition: A | Discharge status: Home / Self Care | Payer: Medicare PPO | Source: Ambulatory Visit | Attending: Family Medicine | Admitting: Family Medicine

## 2023-08-24 ENCOUNTER — Telehealth: Payer: Self-pay

## 2023-08-24 ENCOUNTER — Other Ambulatory Visit: Payer: Self-pay | Admitting: Family Medicine

## 2023-08-24 VITALS — BP 120/60 | HR 54 | Temp 97.7°F | Resp 18 | Ht 62.0 in | Wt 114.0 lb

## 2023-08-24 DIAGNOSIS — R42 Dizziness and giddiness: Secondary | ICD-10-CM

## 2023-08-24 DIAGNOSIS — R911 Solitary pulmonary nodule: Secondary | ICD-10-CM | POA: Insufficient documentation

## 2023-08-24 DIAGNOSIS — R0602 Shortness of breath: Secondary | ICD-10-CM | POA: Insufficient documentation

## 2023-08-24 DIAGNOSIS — R002 Palpitations: Secondary | ICD-10-CM

## 2023-08-24 DIAGNOSIS — Z8501 Personal history of malignant neoplasm of esophagus: Secondary | ICD-10-CM | POA: Diagnosis not present

## 2023-08-24 DIAGNOSIS — R918 Other nonspecific abnormal finding of lung field: Secondary | ICD-10-CM | POA: Diagnosis not present

## 2023-08-24 DIAGNOSIS — R1013 Epigastric pain: Secondary | ICD-10-CM

## 2023-08-24 DIAGNOSIS — J984 Other disorders of lung: Secondary | ICD-10-CM | POA: Diagnosis not present

## 2023-08-24 MED ORDER — IOHEXOL 300 MG/ML  SOLN
80.0000 mL | Freq: Once | INTRAMUSCULAR | Status: AC | PRN
  Administered 2023-08-24: 80 mL via INTRAVENOUS

## 2023-08-24 NOTE — Progress Notes (Signed)
Established Patient Office Visit  Subjective   Patient ID: Briana Craig, female    DOB: 11/05/1945  Age: 77 y.o. MRN: [id number]  Chief Complaint  Patient presents with   Shortness of Breath   Dizziness   Follow-up    HPI Discussed the use of AI scribe software for clinical note transcription with the patient, who gave verbal consent to proceed.  History of Present Illness   The patient, with a history of reflux, presents with ongoing shortness of breath and dizziness. The symptoms are described as 'spells' that occur several times a day, often with exertion such as walking uphill or moving quickly. The patient reports a 'buzzing' sensation in her head that precedes the shortness of breath. The severity of the shortness of breath varies, but it is significant enough to limit the patient's activities. The patient also reports palpitations and feeling shaky after these episodes. The patient has noticed that these symptoms can also be triggered by emotional upset and eating. The patient denies coughing or congestion. She is currently taking pantoprazole and promoted for reflux, and she reports that her symptoms are worse with spicy foods. The patient also takes a multivitamin and calcium supplement.      Patient Active Problem List   Diagnosis Date Noted   SOB (shortness of breath) 07/16/2023   Palpitation 07/16/2023   Wrist pain, acute, right 06/03/2023   Age-related osteoporosis without current pathological fracture 11/27/2021   Preventative health care 05/27/2021   Periorbital cellulitis of right eye 09/11/2019   Diet-controlled diabetes mellitus (HCC) 05/22/2019   Ductal carcinoma in situ (DCIS) of right breast 05/22/2019   Nipple lesion 11/21/2018   Controlled type 2 diabetes mellitus without complication, without long-term current use of insulin (HCC) 05/19/2018   Hyperlipidemia associated with type 2 diabetes mellitus (HCC) 05/19/2018   Vitamin D deficiency 05/19/2018    History of esophageal cancer 05/19/2018   Hematuria 04/08/2015   UTI (urinary tract infection) 04/04/2015   DM (diabetes mellitus) type II uncontrolled, periph vascular disorder 12/06/2014   Dysuria 01/25/2014   IFG (impaired fasting glucose) 01/25/2014   Vitamin D deficiency 05/30/2013   Encounter for therapeutic drug monitoring 05/30/2013   Impaired fasting glucose 05/30/2013   Other malaise and fatigue 05/30/2013   Hyperlipidemia 05/30/2013   GERD (gastroesophageal reflux disease) 05/30/2013   Need for prophylactic vaccination with combined diphtheria-tetanus-pertussis (DTP) vaccine 05/30/2013   Esophageal cancer (HCC)    Dysphagia 11/17/2011   Cancer of esophagus (HCC) 11/13/2011   Past Medical History:  Diagnosis Date   Breast cancer (HCC) 12/2018   right DCIS   Dysphagia    Esophageal cancer (HCC)    GERD (gastroesophageal reflux disease)    Hyperlipemia    Hypertension    no meds now   Impaired fasting blood sugar    Personal history of radiation therapy    PONV (postoperative nausea and vomiting)    Pre-diabetes    Past Surgical History:  Procedure Laterality Date   BREAST BIOPSY Right 12/23/2018   BREAST LUMPECTOMY Right 01/18/2019   BREAST LUMPECTOMY WITH RADIOACTIVE SEED LOCALIZATION Right 01/18/2019   Procedure: RIGHT BREAST CENTRAL LUMPECTOMY WITH RADIOACTIVE SEED LOCALIZATION;  Surgeon: Emelia Loron, MD;  Location: [facility];  Service: General;  Laterality: Right;   DIRECT LARYNGOSCOPY WITH BOTOX INJECTION     tx paralysed vocal cord   ESOPHAGOGASTRODUODENOSCOPY  11/17/2011   Procedure: ESOPHAGOGASTRODUODENOSCOPY (EGD);  Surgeon: Rob Bunting, MD;  Location: [facility];  Service: Endoscopy;  Laterality: N/A;   ESOPHAGUS SURGERY     EYE SURGERY     both cataracts   GANGLION CYST EXCISION Left 01/26/2013   Procedure: LEFT FLEXOR CARPI RADIALIS  RELEASE AND SCAPHO TRAPEZIAL TRAPEZOID DEBRIDEMENT ;  Surgeon: Tami Ribas, MD;  Location:  [facility];  Service: Orthopedics;  Laterality: Left;   Social History   Tobacco Use   Smoking status: Former    Current packs/day: 0.00    Types: Cigarettes    Quit date: 08/30/1987    Years since quitting: 36.0   Smokeless tobacco: Never  Substance Use Topics   Alcohol use: No   Drug use: Never   Social History   Socioeconomic History   Marital status: Widowed    Spouse name: Dorene Sorrow   Number of children: 0   Years of education: 18   Highest education level: Not on file  Occupational History   Occupation: RETIRED     Employer: OTHER  Tobacco Use   Smoking status: Former    Current packs/day: 0.00    Types: Cigarettes    Quit date: 08/30/1987    Years since quitting: 36.0   Smokeless tobacco: Never  Substance and Sexual Activity   Alcohol use: No   Drug use: Never   Sexual activity: Yes    Partners: Male    Birth control/protection: Post-menopausal  Other Topics Concern   Not on file  Social History Narrative   Marital Status:  Married Dorene Sorrow)   Children:  None    Pets: Dog (1)    Living Situation: Lives with husband.   Occupation:  Retired Loss adjuster, chartered)    Education: Master's Degree    Tobacco Use/Exposure:  Formal Smoker    Alcohol Use:  Occasional   Drug Use:  None   Diet:  Regular   Exercise:  Walking 1 mile 5 times per week.    Hobbies:  Reading                Social Determinants of Health   Financial Resource Strain: Low Risk  (01/06/2022)   Overall Financial Resource Strain (CARDIA)    Difficulty of Paying Living Expenses: Not hard at all  Food Insecurity: No Food Insecurity (09/11/2022)   Hunger Vital Sign    Worried About Running Out of Food in the Last Year: Never true    Ran Out of Food in the Last Year: Never true  Transportation Needs: No Transportation Needs (09/11/2022)   PRAPARE - Administrator, Civil Service (Medical): No    Lack of Transportation (Non-Medical): No  Physical Activity: Sufficiently Active  (01/06/2022)   Exercise Vital Sign    Days of Exercise per Week: 7 days    Minutes of Exercise per Session: 60 min  Stress: No Stress Concern Present (12/19/2020)   [employer] of Occupational Health - Occupational Stress Questionnaire    Feeling of Stress : Not at all  Social Connections: Socially Isolated (01/06/2022)   Social Connection and Isolation Panel [NHANES]    Frequency of Communication with Friends and Family: More than three times a week    Frequency of Social Gatherings with Friends and Family: More than three times a week    Attends Religious Services: Never    Database administrator or Organizations: No    Attends Banker Meetings: Never    Marital Status: Widowed  Intimate Partner Violence: Not At Risk (01/11/2023)   Humiliation, Afraid, Rape, and Kick questionnaire  Fear of Current or Ex-Partner: No    Emotionally Abused: No    Physically Abused: No    Sexually Abused: No   Family Status  Relation Name Status   Sister  Alive   Mother  Deceased   Father  Deceased   Neg Hx  (Not Specified)  No partnership data on file   Family History  Problem Relation Age of Onset   Alcohol abuse Sister    AAA (abdominal aortic aneurysm) Mother    COPD Father    Colon cancer Neg Hx    Allergies  Allergen Reactions   Taxol [Paclitaxel] Hives and Itching   Tramadol     "Sweaty, shaky and faint"   Carboplatin Hives and Itching   Codeine    Compazine    Compazine  [Prochlorperazine Maleate] Other (See Comments)    Severe chest pain   Penicillins    Prochlorperazine Edisylate     Other reaction(s): Other (See Comments) Severe chest pain      Review of Systems  Constitutional:  Negative for chills, fever and malaise/fatigue.  HENT:  Negative for congestion and hearing loss.   Eyes:  Negative for blurred vision and discharge.  Respiratory:  Positive for shortness of breath. Negative for cough and sputum production.   Cardiovascular:  Positive for  palpitations. Negative for chest pain and leg swelling.  Gastrointestinal:  Negative for abdominal pain, blood in stool, constipation, diarrhea, heartburn, nausea and vomiting.  Genitourinary:  Negative for dysuria, frequency, hematuria and urgency.  Musculoskeletal:  Negative for back pain, falls and myalgias.  Skin:  Negative for rash.  Neurological:  Positive for dizziness. Negative for sensory change, loss of consciousness, weakness and headaches.  Endo/Heme/Allergies:  Negative for environmental allergies. Does not bruise/bleed easily.  Psychiatric/Behavioral:  Negative for depression and suicidal ideas. The patient is not nervous/anxious and does not have insomnia.       Objective:     BP 120/60 (BP Location: Left Arm, Patient Position: Sitting, Cuff Size: Normal)   Pulse (!) 54   Temp 97.7 F (36.5 C) (Oral)   Resp 18   Ht 5\' 2"  (1.575 m)   Wt 114 lb (51.7 kg)   SpO2 93%   BMI 20.85 kg/m  BP Readings from Last 3 Encounters:  08/24/23 120/60  07/16/23 (!) 144/86  06/03/23 134/80   Wt Readings from Last 3 Encounters:  08/24/23 114 lb (51.7 kg)  07/16/23 112 lb 9.6 oz (51.1 kg)  06/03/23 113 lb (51.3 kg)   SpO2 Readings from Last 3 Encounters:  08/24/23 93%  07/16/23 96%  06/03/23 100%      Physical Exam Vitals and nursing note reviewed.  Constitutional:      General: She is not in acute distress.    Appearance: Normal appearance. She is well-developed.  HENT:     Head: Normocephalic and atraumatic.  Eyes:     General: No scleral icterus.       Right eye: No discharge.        Left eye: No discharge.  Cardiovascular:     Rate and Rhythm: Normal rate and regular rhythm.     Heart sounds: No murmur heard. Pulmonary:     Effort: Pulmonary effort is normal. No respiratory distress.     Breath sounds: Normal breath sounds.  Musculoskeletal:        General: Normal range of motion.     Cervical back: Normal range of motion and neck supple.     Right  lower leg:  No edema.     Left lower leg: No edema.  Skin:    General: Skin is warm and dry.  Neurological:     Mental Status: She is alert and oriented to person, place, and time.  Psychiatric:        Mood and Affect: Mood normal.        Behavior: Behavior normal.        Thought Content: Thought content normal.        Judgment: Judgment normal.      No results found for any visits on 08/24/23.  Last CBC Lab Results  Component Value Date   WBC 8.7 07/16/2023   HGB 13.2 07/16/2023   HCT 40.5 07/16/2023   MCV 93.4 07/16/2023   MCH 28.6 04/24/2013   RDW 14.7 07/16/2023   PLT 236.0 07/16/2023   Last metabolic panel Lab Results  Component Value Date   GLUCOSE 147 (H) 07/16/2023   NA 137 07/16/2023   K 4.0 07/16/2023   CL 100 07/16/2023   CO2 29 07/16/2023   BUN 15 07/16/2023   CREATININE 0.97 07/16/2023   GFR 55.87 (L) 07/16/2023   CALCIUM 9.7 07/16/2023   PROT 7.2 07/16/2023   ALBUMIN 4.1 07/16/2023   BILITOT 0.7 07/16/2023   ALKPHOS 57 07/16/2023   AST 21 07/16/2023   ALT 14 07/16/2023   Last lipids Lab Results  Component Value Date   CHOL 193 05/27/2023   HDL 70.00 05/27/2023   LDLCALC 93 05/27/2023   TRIG 150.0 (H) 05/27/2023   CHOLHDL 3 05/27/2023   Last hemoglobin A1c Lab Results  Component Value Date   HGBA1C 7.1 (H) 07/16/2023   Last thyroid functions Lab Results  Component Value Date   TSH 1.66 07/16/2023   Last vitamin D Lab Results  Component Value Date   VD25OH 66.43 11/27/2021   Last vitamin B12 and Folate Lab Results  Component Value Date   VITAMINB12 1,342 (H) 07/16/2023     Narrative & Impression  CLINICAL DATA:  sob   EXAM: CHEST - 2 VIEW   COMPARISON:  Chest x-ray 07/16/2023.   FINDINGS: Hyperinflation. Ill-defined right upper lung reticulonodular opacities are similar. New nodular opacities in the left lung. No visible pleural effusions or pneumothorax. Similar cardiomediastinal silhouette. Tortuous aorta. Polyarticular  degenerative change.   IMPRESSION: 1. New nodular opacities in the left lung and similar right upper lung reticulonodular opacities. Recommend chest CT (preferably with contrast) to further evaluate and to exclude malignancy. 2. Hyperinflation, suggestive of COPD/emphysema.   These results will be called to the ordering clinician or representative by the Radiologist Assistant, and communication documented in the PACS or [employer].     Electronically Signed   By: Feliberto Harts M.D.   On: 08/24/2023 15:33      The 10-year ASCVD risk score (Arnett DK, et al., 2019) is: 35.3%    Assessment & Plan:   Problem List Items Addressed This Visit       Unprioritized   SOB (shortness of breath) - Primary   Relevant Orders   Ambulatory referral to Cardiology   Palpitation   Relevant Orders   Ambulatory referral to Cardiology   Other Visit Diagnoses     Dizziness       Relevant Orders   Ambulatory referral to Cardiology   Dyspepsia       Relevant Orders   Ambulatory referral to Gastroenterology   Solitary pulmonary nodule       Relevant  Orders   CT Chest W Contrast     Assessment and Plan    Dyspnea and Dizziness Symptoms worsen with exertion and occasionally occur at rest. Symptoms are associated with a sensation of a slow pulse and occasional palpitations. No associated chest pain, cough, or congestion. -Order a Holter monitor to evaluate for arrhythmias. -Refer to cardiology for further evaluation.  Abnormal Chest X-ray New findings in the left and right lung. No associated cough or congestion. -Order a CT scan with contrast to further evaluate these findings.  Gastroesophageal Reflux Disease (GERD) Symptoms controlled with Pantoprazole and Promotidine, except with spicy food or lying flat. -Continue current medications and lifestyle modifications.  General Health Maintenance -Continue current multivitamin and calcium supplementation.        Return  if symptoms worsen or fail to improve.    Donato Schultz, DO

## 2023-08-24 NOTE — Telephone Encounter (Signed)
Pt called to check the status of her imaging results that was performed 10/11. She explained how she is very worried because she hasn't heard back about her results yet.   She then mentioned that she was still experiencing pain, dizzy spells, and SOB. I transferred her to speak with a triage nurse and advise that a msg would be sent back. Please call and advise pt.

## 2023-08-24 NOTE — Patient Instructions (Signed)
Dizziness Dizziness is a common problem. It makes you feel unsteady or light-headed. You may feel like you're about to faint. Dizziness can lead to getting hurt if you stumble or fall. It's more common to feel dizzy if you're an older adult. Many things can cause you to feel dizzy. These include: Medicines. Dehydration. This is when there's not enough water in your body. Illness. Follow these instructions at home: Eating and drinking  Drink enough fluid to keep your pee (urine) pale yellow. This helps keep you from getting dehydrated. Try to drink more clear fluids, such as water. Do not drink alcohol. Try to limit how much caffeine you take in. Try to limit how much salt, also called sodium, you take in. Activity Try not to make quick movements. Stand up slowly from sitting in a chair. Steady yourself until you feel okay. In the morning, first sit up on the side of the bed. When you feel okay, hold onto something and slowly stand up. Do this until you know that your balance is okay. If you need to stand in one place for a long time, move your legs often. Tighten and relax the muscles in your legs while you're standing. Do not drive or use machines if you feel dizzy. Avoid bending down if you feel dizzy. Place items in your home so you can reach them without leaning over. Lifestyle Do not smoke, vape, or use products with nicotine or tobacco in them. If you need help quitting, talk with your health care provider. Try to lower your stress level. You can do this by using methods like yoga or meditation. Talk with your provider if you need help. General instructions Watch your dizziness for any changes. Take your medicines only as told by your provider. Talk with your provider if you think you're dizzy because of a medicine you're taking. Tell a friend or a family member that you're feeling dizzy. If they spot any changes in your behavior, have them call your provider. Contact a health care  provider if: Your dizziness doesn't go away, or you have new symptoms. Your dizziness gets worse. You feel like you may vomit. You have trouble hearing. You have a fever. You have neck pain or a stiff neck. You fall or get hurt. Get help right away if: You vomit each time you eat or drink. You have watery poop and can't eat or drink. You have trouble talking, walking, swallowing, or using your arms, hands, or legs. You feel very weak. You're bleeding. You're not thinking clearly, or you have trouble forming sentences. A friend or family member may spot this. Your vision changes, or you get a very bad headache. These symptoms may be an emergency. Call 911 right away. Do not wait to see if the symptoms will go away. Do not drive yourself to the hospital. This information is not intended to replace advice given to you by your health care provider. Make sure you discuss any questions you have with your health care provider. Document Revised: 12/03/2022 Document Reviewed: 12/03/2022 Elsevier Patient Education  2024 [employer].

## 2023-08-24 NOTE — Progress Notes (Unsigned)
EP to read

## 2023-08-24 NOTE — Telephone Encounter (Signed)
Pt is scheduled to see PCP 08/24/2023.

## 2023-08-24 NOTE — Telephone Encounter (Signed)
Initial Comment Caller is experiencing dizziness and shortness of breath. Translation No Nurse Assessment Nurse: Shawnie Dapper, RN, Arline Asp Date/Time (Eastern Time): 08/24/2023 1:25:27 PM Confirm and document reason for call. If symptomatic, describe symptoms. ---Caller states that she is having dizziness and shortness of breath. Caller states that it has been going on since September. Caller states that she was seen in September and had an ECG and x-ray. Caller states that results showed a mild heart murmur. Caller states she has episodes where her head buzzes, shortness of breath and feels like her heart skips a beat. Does the patient have any new or worsening symptoms? ---Yes Will a triage be completed? ---Yes Related visit to physician within the last 2 weeks? ---No Does the PT have any chronic conditions? (i.e. diabetes, asthma, this includes High risk factors for pregnancy, etc.) ---Yes List chronic conditions. ---borderline DM Is this a behavioral health or substance abuse call? ---No Guidelines Guideline Title Affirmed Question Affirmed Notes Nurse Date/Time (Eastern Time) Dizziness - Lightheadedness Extra heartbeats, irregular heart beating, or heart is beating very fast (i.e., "palpitations") Elana Alm 08/24/2023 1:29:42 PM PLEASE NOTE: All timestamps contained within this report are represented as [place] Standard Time. CONFIDENTIALTY NOTICE: This fax transmission is intended only for the addressee. It contains information that is legally privileged, confidential or otherwise protected from use or disclosure. If you are not the intended recipient, you are strictly prohibited from reviewing, disclosing, copying using or disseminating any of this information or taking any action in reliance on or regarding this information. If you have received this fax in error, please notify us immediately by telephone so that we can arrange for its return to [place]. Phone: [phone],  Toll-Free: [phone], Fax: [phone] Page: 2 of 2 Call Id: [id number] Disp. Time Lamount Cohen Time) Disposition Final User 08/24/2023 1:23:10 PM Send to Urgent Queue Suanne Marker 08/24/2023 1:39:37 PM Go to ED Now (or PCP triage) Yes Shawnie Dapper, RN, Arline Asp Final Disposition 08/24/2023 1:39:37 PM Go to ED Now (or PCP triage) Yes Shawnie Dapper, RN, [facility] Filter Disagree/Comply Disagree Caller Understands Yes PreDisposition InappropriateToAsk Care Advice Given Per Guideline GO TO ED/UCC NOW (OR PCP TRIAGE): * IF NO PCP (PRIMARY CARE PROVIDER) SECOND-LEVEL TRIAGE: You need to be seen within the next hour. Go to the ED/UCC at _____________ Hospital. Leave as soon as you can. CARE ADVICE given per Dizziness - Lightheadedness (Adult) guideline. Comments User: Christy Gentles, RN Date/Time Lamount Cohen Time): 08/24/2023 1:41:13 PM Caller states she does not want to be seen at a walk in or UC, she wants to speak with her provider. User: Christy Gentles, RN Date/Time Lamount Cohen Time): 08/24/2023 1:41:30 PM Left message with office and they will call patient Referrals REFERRED TO PCP OFFICE

## 2023-08-24 NOTE — Telephone Encounter (Signed)
Impression:   1. New nodular opacities in the left lung and similar right upper lung reticulonodular opacities. Recommend chest CT (preferably with contrast) to further evaluate and to exclude malignancy. 2. Hyperinflation, suggestive of COPD/emphysema.

## 2023-08-24 NOTE — Telephone Encounter (Signed)
Pt had CT chest today.

## 2023-08-25 ENCOUNTER — Telehealth: Payer: Self-pay | Admitting: Family Medicine

## 2023-08-25 ENCOUNTER — Other Ambulatory Visit: Payer: Self-pay | Admitting: Family Medicine

## 2023-08-25 ENCOUNTER — Other Ambulatory Visit: Payer: Self-pay

## 2023-08-25 DIAGNOSIS — R0602 Shortness of breath: Secondary | ICD-10-CM

## 2023-08-25 DIAGNOSIS — R9389 Abnormal findings on diagnostic imaging of other specified body structures: Secondary | ICD-10-CM

## 2023-08-25 MED ORDER — AZITHROMYCIN 250 MG PO TABS: ORAL_TABLET | ORAL | 0 refills | Status: DC

## 2023-08-25 NOTE — Telephone Encounter (Signed)
Pt called and stated that she called the pulmonology office that her referral was sent to today, and they stated that they did not receive the referral.  She provided the fax number, [phone]. Please advise.

## 2023-08-26 ENCOUNTER — Telehealth: Payer: Self-pay | Admitting: Family Medicine

## 2023-08-26 DIAGNOSIS — R0602 Shortness of breath: Secondary | ICD-10-CM

## 2023-08-26 DIAGNOSIS — R002 Palpitations: Secondary | ICD-10-CM

## 2023-08-26 DIAGNOSIS — R42 Dizziness and giddiness: Secondary | ICD-10-CM

## 2023-08-26 NOTE — Telephone Encounter (Signed)
Noted  

## 2023-08-26 NOTE — Telephone Encounter (Signed)
Pt called to inquire into the status of her Heart Monitor that was ordered from her 9.13.24 visit. Please Advise.

## 2023-08-26 NOTE — Telephone Encounter (Signed)
Pt called stating that she would feel more comfortable going to a [facility] Pulmonologist, if possible. Pt would like to be seen with the following practice:  [facility] at [facility] [address], [place], [place] [address] P: [phone]

## 2023-08-27 ENCOUNTER — Ambulatory Visit: Payer: Medicare PPO | Attending: Family Medicine

## 2023-08-27 DIAGNOSIS — R002 Palpitations: Secondary | ICD-10-CM

## 2023-08-27 NOTE — Telephone Encounter (Signed)
Pt called. LVM about new order for heart monitor being placed today

## 2023-08-27 NOTE — Addendum Note (Signed)
Addended by: Roxanne Gates on: 08/27/2023 03:41 PM   Modules accepted: Orders

## 2023-08-27 NOTE — Progress Notes (Unsigned)
EP to read

## 2023-08-31 NOTE — Telephone Encounter (Signed)
FYI - Pt is currently scheduled for 12.10.24 with  Pulmonary but she is doing some checking around to see if she can get in sooner with [facility]. Pt wanted to also have something added to her chart stating she would prefer to be seen in the [facility] system for any future referrals, if possible.

## 2023-08-31 NOTE — Telephone Encounter (Signed)
Noted  

## 2023-09-20 DIAGNOSIS — R42 Dizziness and giddiness: Secondary | ICD-10-CM | POA: Diagnosis not present

## 2023-09-20 DIAGNOSIS — R002 Palpitations: Secondary | ICD-10-CM | POA: Diagnosis not present

## 2023-09-21 ENCOUNTER — Telehealth: Payer: Self-pay | Admitting: Family Medicine

## 2023-09-21 ENCOUNTER — Telehealth: Payer: Self-pay | Admitting: Cardiology

## 2023-09-21 NOTE — Telephone Encounter (Signed)
Heart care called and would like a cal back regarding a critical reading that was reported from the heart monitor from patient. Please call [phone]

## 2023-09-21 NOTE — Telephone Encounter (Signed)
Spoke with Zollie Scale who reports that the pt had 3 critical events on her Zio monitor.  Pt had complete heart block at rate of 42 bpm lasting 10.1 seconds. Can be found on page 15 strip 7.  Pt had back to back ventricular asystole due to high grade AV block lasting 7.3 seconds. Can be found on page 16 strip 9 on 09/03/23 at 8:33 am.  Pt had ventricular asystole due to high grade AV block lasting 4.4 seconds. Can be found on page 14 strip 5.  Dr. Tomie China made aware and recommends that we call Dr. Zola Button and make her aware that the pt needs to go to the closest ED for evaluation. Called the office and left a VM that we had a critical result. Will also send a secure chat.

## 2023-09-21 NOTE — Telephone Encounter (Signed)
Caller Zollie Scale) is reporting abnormal Zio patch results.

## 2023-09-21 NOTE — Telephone Encounter (Signed)
Spoke with [place] at [facility]. They received report/strips from [facility] heart monitor. Dr. Tomie China recommends patient be evaluated at closest ER.   Spoke with patient, advised to go to ER. Patient refuses, stating she feels great.  Explained the importance of evaluation d/t findings from Zio monitor, patient states she will wait until she sees cardiology on 10/06/23. Strongly encouraged again for patient to go to ER, continued to refuse.  Patient denies CP or SOB, agrees to go to ER if symptoms begin.

## 2023-09-23 ENCOUNTER — Ambulatory Visit: Payer: Medicare PPO | Attending: Cardiovascular Disease | Admitting: Cardiovascular Disease

## 2023-09-23 ENCOUNTER — Encounter: Payer: Self-pay | Admitting: Cardiovascular Disease

## 2023-09-23 VITALS — BP 130/80 | HR 92 | Ht 62.0 in | Wt 113.0 lb

## 2023-09-23 DIAGNOSIS — R002 Palpitations: Secondary | ICD-10-CM

## 2023-09-23 NOTE — Progress Notes (Signed)
Electrophysiology Office Note:    Date:  09/23/2023   ID:  Briana Craig, DOB 10/07/1946, MRN [id number]  PCP:  Zola Button, Grayling Congress, DO   [facility] Providers Cardiologist:  None     Referring MD: Zola Button, Grayling Congress, *   History of Present Illness:    Briana Craig is a 77 y.o. female with a medical history significant for esophageal cancer with chemotherapy and radiotherapy, referred for symptomatic bradycardia.     He presented to her PCP in September with complaints of "buzzing sensation" in her head associate with shortness of breath and lightheadedness.  She noticed that her pulse slows down during these episodes.  A monitor was placed with results detailed below.  In brief there were episodes of complete heart block and second-degree heart block associated with symptoms of dizziness     Today, she reports that she is doing well.  She continues to have episodic dizziness and lightheadedness but she has not had any syncope  EKGs/Labs/Other Studies Reviewed Today:     Echocardiogram:  Transthoracic August 17, 2023 EF 60 to 65%.   Monitors:  Zio monitor -14 days November 2024 Wear time: 14 days   Sinus rhythm predominates Sinus HR 44-113 bpm, avg 68   There were frequent (147 events) pauses > 3 seconds due to AV block. These events occurred during the daytime. Symptoms of dizziness and shortness of breath correlated with complete heart block. The longest pause was 6.3 seconds and occurred at 7:25 AM on 10/25. There were a few brief episodes of ectopic atrial tachycardia. The longest was 4.9 seconds.    EKG:   EKG Interpretation Date/Time:  Thursday September 23 2023 12:13:25 EST Ventricular Rate:  92 PR Interval:  128 QRS Duration:  66 QT Interval:  370 QTC Calculation: 457 R Axis:   -65  Text Interpretation: Normal sinus rhythm Left axis deviation Nonspecific T wave abnormality When compared with ECG of 19-Feb-2011 09:31, Nonspecific  T wave abnormality now evident in Anterior leads Confirmed by York Pellant [phone]) on 09/23/2023 12:21:57 PM     Physical Exam:    VS:  BP 130/80 (BP Location: Left Arm, Patient Position: Sitting, Cuff Size: Normal)   Pulse 92   Ht 5\' 2"  (1.575 m)   Wt 113 lb (51.3 kg)   SpO2 97%   BMI 20.67 kg/m     Wt Readings from Last 3 Encounters:  09/23/23 113 lb (51.3 kg)  08/24/23 114 lb (51.7 kg)  07/16/23 112 lb 9.6 oz (51.1 kg)     GEN:  Well nourished, well developed in no acute distress CARDIAC: RRR, no murmurs, rubs, gallops RESPIRATORY:  Normal work of breathing MUSCULOSKELETAL: no edema    ASSESSMENT & PLAN:     Paroxysmal complete heart block Also episodes of second-degree AV block and high-grade AV block Associated with symptoms of lightheadedness and dizziness She declined to go to the ER We will schedule her for urgent placement of a dual-chamber pacemaker  I discussed the indication for the procedure and the logistics, risks, potential benefit, and after care. I specifically explained that risks include but are not limited to infection, bleeding,damage to blood vessels, lung, and the heart -- but risk of prolonged hospitalization, need for surgery, or the event of stroke, heart attack, or death are low but not zero.    I reviewed notes by Dr. Zola Button, laboratory results from September 13 in addition to studies listed above.  I have  corresponded with Dr. Zola Button regarding the concerning monitor findings.  Signed, Maurice Small, MD  09/23/2023 12:23 PM    [facility]

## 2023-09-23 NOTE — Pre-Procedure Instructions (Signed)
Instructed patient on the following items: Arrival time 2:00 Nothing to eat or drink after midnight Responsible person to drive you home and stay with you for 24 hrs Wash with special soap night before and morning of procedure

## 2023-09-23 NOTE — H&P (View-Only) (Signed)
Electrophysiology Office Note:    Date:  09/23/2023   ID:  Briana Craig, DOB June 15, 1946, MRN [id number]  PCP:  Zola Button, Grayling Congress, DO   [facility] Providers Cardiologist:  None     Referring MD: Zola Button, Grayling Congress, *   History of Present Illness:    Briana Craig is a 77 y.o. female with a medical history significant for esophageal cancer with chemotherapy and radiotherapy, referred for symptomatic bradycardia.     He presented to her PCP in September with complaints of "buzzing sensation" in her head associate with shortness of breath and lightheadedness.  She noticed that her pulse slows down during these episodes.  A monitor was placed with results detailed below.  In brief there were episodes of complete heart block and second-degree heart block associated with symptoms of dizziness     Today, she reports that she is doing well.  She continues to have episodic dizziness and lightheadedness but she has not had any syncope  EKGs/Labs/Other Studies Reviewed Today:     Echocardiogram:  Transthoracic August 17, 2023 EF 60 to 65%.   Monitors:  Zio monitor -14 days November 2024 Wear time: 14 days   Sinus rhythm predominates Sinus HR 44-113 bpm, avg 68   There were frequent (147 events) pauses > 3 seconds due to AV block. These events occurred during the daytime. Symptoms of dizziness and shortness of breath correlated with complete heart block. The longest pause was 6.3 seconds and occurred at 7:25 AM on 10/25. There were a few brief episodes of ectopic atrial tachycardia. The longest was 4.9 seconds.    EKG:   EKG Interpretation Date/Time:  Thursday September 23 2023 12:13:25 EST Ventricular Rate:  92 PR Interval:  128 QRS Duration:  66 QT Interval:  370 QTC Calculation: 457 R Axis:   -65  Text Interpretation: Normal sinus rhythm Left axis deviation Nonspecific T wave abnormality When compared with ECG of 19-Feb-2011 09:31, Nonspecific  T wave abnormality now evident in Anterior leads Confirmed by York Pellant [phone]) on 09/23/2023 12:21:57 PM     Physical Exam:    VS:  BP 130/80 (BP Location: Left Arm, Patient Position: Sitting, Cuff Size: Normal)   Pulse 92   Ht 5\' 2"  (1.575 m)   Wt 113 lb (51.3 kg)   SpO2 97%   BMI 20.67 kg/m     Wt Readings from Last 3 Encounters:  09/23/23 113 lb (51.3 kg)  08/24/23 114 lb (51.7 kg)  07/16/23 112 lb 9.6 oz (51.1 kg)     GEN:  Well nourished, well developed in no acute distress CARDIAC: RRR, no murmurs, rubs, gallops RESPIRATORY:  Normal work of breathing MUSCULOSKELETAL: no edema    ASSESSMENT & PLAN:     Paroxysmal complete heart block Also episodes of second-degree AV block and high-grade AV block Associated with symptoms of lightheadedness and dizziness She declined to go to the ER We will schedule her for urgent placement of a dual-chamber pacemaker  I discussed the indication for the procedure and the logistics, risks, potential benefit, and after care. I specifically explained that risks include but are not limited to infection, bleeding,damage to blood vessels, lung, and the heart -- but risk of prolonged hospitalization, need for surgery, or the event of stroke, heart attack, or death are low but not zero.    I reviewed notes by Dr. Zola Button, laboratory results from September 13 in addition to studies listed above.  I have  corresponded with Dr. Zola Button regarding the concerning monitor findings.  Signed, Maurice Small, MD  09/23/2023 12:23 PM    [facility]

## 2023-09-23 NOTE — Patient Instructions (Signed)
Medication Instructions:  Your physician recommends that you continue on your current medications as directed. Please refer to the Current Medication list given to you today. *If you need a refill on your cardiac medications before your next appointment, please call your pharmacy*   Lab Work: CBC and BMET today If you have labs (blood work) drawn today and your tests are completely normal, you will receive your results only by: MyChart Message (if you have MyChart) OR A paper copy in the mail If you have any lab test that is abnormal or we need to change your treatment, we will call you to review the results.   Testing/Procedures: Pacemaker implant Your physician has recommended that you have a pacemaker inserted. A pacemaker is a small device that is placed under the skin of your chest or abdomen to help control abnormal heart rhythms. This device uses electrical pulses to prompt the heart to beat at a normal rate. Pacemakers are used to treat heart rhythms that are too slow. Wire (leads) are attached to the pacemaker that goes into the chambers of you heart. This is done in the hospital and usually requires and overnight stay. Please see the instruction sheet given to you today for more information.   Follow-Up: At [facility], you and your health needs are our priority.  As part of our continuing mission to provide you with exceptional heart care, we have created designated Provider Care Teams.  These Care Teams include your primary Cardiologist (physician) and Advanced Practice Providers (APPs -  Physician Assistants and Nurse Practitioners) who all work together to provide you with the care you need, when you need it.  We recommend signing up for the patient portal called "MyChart".  Sign up information is provided on this After Visit Summary.  MyChart is used to connect with patients for Virtual Visits (Telemedicine).  Patients are able to view lab/test results, encounter notes,  upcoming appointments, etc.  Non-urgent messages can be sent to your provider as well.   To learn more about what you can do with MyChart, go to [email or web address].    Your next appointment:   We will schedule follow up after pacemaker implant  Provider:   York Pellant, MD

## 2023-09-24 ENCOUNTER — Telehealth: Payer: Self-pay | Admitting: Family Medicine

## 2023-09-24 ENCOUNTER — Encounter (HOSPITAL_COMMUNITY)
Admission: RE | Disposition: A | Discharge status: Home / Self Care | Payer: Self-pay | Source: Home / Self Care | Attending: Cardiovascular Disease

## 2023-09-24 ENCOUNTER — Ambulatory Visit (HOSPITAL_COMMUNITY)
Admission: RE | Admit: 2023-09-24 | Discharge: 2023-09-24 | Disposition: A | Discharge status: Home / Self Care | Payer: Medicare PPO | Attending: Cardiovascular Disease | Admitting: Cardiovascular Disease

## 2023-09-24 ENCOUNTER — Ambulatory Visit (HOSPITAL_COMMUNITY): Payer: Medicare PPO

## 2023-09-24 ENCOUNTER — Other Ambulatory Visit: Payer: Self-pay

## 2023-09-24 DIAGNOSIS — I700 Atherosclerosis of aorta: Secondary | ICD-10-CM | POA: Diagnosis not present

## 2023-09-24 DIAGNOSIS — Z923 Personal history of irradiation: Secondary | ICD-10-CM | POA: Insufficient documentation

## 2023-09-24 DIAGNOSIS — I441 Atrioventricular block, second degree: Secondary | ICD-10-CM | POA: Diagnosis not present

## 2023-09-24 DIAGNOSIS — R918 Other nonspecific abnormal finding of lung field: Secondary | ICD-10-CM | POA: Diagnosis not present

## 2023-09-24 DIAGNOSIS — I442 Atrioventricular block, complete: Secondary | ICD-10-CM | POA: Insufficient documentation

## 2023-09-24 DIAGNOSIS — Z9221 Personal history of antineoplastic chemotherapy: Secondary | ICD-10-CM | POA: Insufficient documentation

## 2023-09-24 DIAGNOSIS — R001 Bradycardia, unspecified: Secondary | ICD-10-CM | POA: Insufficient documentation

## 2023-09-24 DIAGNOSIS — Z8501 Personal history of malignant neoplasm of esophagus: Secondary | ICD-10-CM | POA: Diagnosis not present

## 2023-09-24 DIAGNOSIS — Z950 Presence of cardiac pacemaker: Secondary | ICD-10-CM | POA: Diagnosis not present

## 2023-09-24 DIAGNOSIS — K449 Diaphragmatic hernia without obstruction or gangrene: Secondary | ICD-10-CM | POA: Diagnosis not present

## 2023-09-24 HISTORY — PX: PACEMAKER IMPLANT: EP1218

## 2023-09-24 LAB — GLUCOSE, CAPILLARY
Glucose-Capillary: 99 mg/dL (ref 70–99)
Glucose-Capillary: 118 mg/dL — ABNORMAL HIGH (ref 70–99)

## 2023-09-24 LAB — CBC
Hematocrit: 41.1 % (ref 34.0–46.6)
Hemoglobin: 13.5 g/dL (ref 11.1–15.9)
MCH: 30.3 pg (ref 26.6–33.0)
MCHC: 32.8 g/dL (ref 31.5–35.7)
MCV: 92 fL (ref 79–97)
Platelets: 222 10*3/uL (ref 150–450)
RBC: 4.45 x10E6/uL (ref 3.77–5.28)
RDW: 13.6 % (ref 11.7–15.4)
WBC: 6.7 10*3/uL (ref 3.4–10.8)

## 2023-09-24 LAB — BASIC METABOLIC PANEL
BUN/Creatinine Ratio: 16 (ref 12–28)
BUN: 15 mg/dL (ref 8–27)
CO2: 23 mmol/L (ref 20–29)
Calcium: 9.7 mg/dL (ref 8.7–10.3)
Chloride: 101 mmol/L (ref 96–106)
Creatinine, Ser: 0.91 mg/dL (ref 0.57–1.00)
Glucose: 131 mg/dL — ABNORMAL HIGH (ref 70–99)
Potassium: 4.6 mmol/L (ref 3.5–5.2)
Sodium: 141 mmol/L (ref 134–144)
eGFR: 65 mL/min/{1.73_m2} (ref >59)

## 2023-09-24 SURGERY — PACEMAKER IMPLANT

## 2023-09-24 MED ORDER — ACETAMINOPHEN 325 MG PO TABS: 325.0000 mg | ORAL_TABLET | ORAL | Status: DC | PRN

## 2023-09-24 MED ORDER — MIDAZOLAM HCL 5 MG/5ML IJ SOLN
INTRAMUSCULAR | Status: DC | PRN
  Administered 2023-09-24 (×2): 1 mg via INTRAVENOUS

## 2023-09-24 MED ORDER — MIDAZOLAM HCL 5 MG/5ML IJ SOLN
INTRAMUSCULAR | Status: AC
  Filled 2023-09-24: qty 5

## 2023-09-24 MED ORDER — LIDOCAINE HCL 1 % IJ SOLN
INTRAMUSCULAR | Status: AC
  Filled 2023-09-24: qty 60

## 2023-09-24 MED ORDER — SODIUM CHLORIDE 0.9 % IV SOLN
INTRAVENOUS | Status: AC
  Administered 2023-09-24: 80 mg
  Filled 2023-09-24: qty 2

## 2023-09-24 MED ORDER — CEFAZOLIN SODIUM-DEXTROSE 2-4 GM/100ML-% IV SOLN
INTRAVENOUS | Status: AC
  Administered 2023-09-24: 2 g via INTRAVENOUS
  Filled 2023-09-24: qty 100

## 2023-09-24 MED ORDER — CEFAZOLIN SODIUM-DEXTROSE 2-4 GM/100ML-% IV SOLN
INTRAVENOUS | Status: AC
  Filled 2023-09-24: qty 100

## 2023-09-24 MED ORDER — SODIUM CHLORIDE 0.9 % IV SOLN: INTRAVENOUS | Status: DC

## 2023-09-24 MED ORDER — LIDOCAINE HCL (PF) 1 % IJ SOLN
INTRAMUSCULAR | Status: DC | PRN
  Administered 2023-09-24: 50 mL

## 2023-09-24 MED ORDER — FENTANYL CITRATE (PF) 100 MCG/2ML IJ SOLN
INTRAMUSCULAR | Status: AC
  Filled 2023-09-24: qty 2

## 2023-09-24 MED ORDER — CEFAZOLIN SODIUM-DEXTROSE 2-4 GM/100ML-% IV SOLN: 2.0000 g | INTRAVENOUS | Status: AC

## 2023-09-24 MED ORDER — HEPARIN (PORCINE) IN NACL 1000-0.9 UT/500ML-% IV SOLN
INTRAVENOUS | Status: DC | PRN
  Administered 2023-09-24: 500 mL

## 2023-09-24 MED ORDER — ONDANSETRON HCL 4 MG/2ML IJ SOLN: 4.0000 mg | Freq: Four times a day (QID) | INTRAMUSCULAR | Status: DC | PRN

## 2023-09-24 MED ORDER — FENTANYL CITRATE (PF) 100 MCG/2ML IJ SOLN
INTRAMUSCULAR | Status: DC | PRN
  Administered 2023-09-24 (×2): 25 ug via INTRAVENOUS

## 2023-09-24 MED ORDER — SODIUM CHLORIDE 0.9 % IV SOLN: 80.0000 mg | INTRAVENOUS | Status: AC

## 2023-09-24 MED ORDER — POVIDONE-IODINE 10 % EX SWAB
2.0000 | Freq: Once | CUTANEOUS | Status: AC
  Administered 2023-09-24: 2 via TOPICAL

## 2023-09-24 SURGICAL SUPPLY — 13 items
CABLE SURGICAL S-101-97-12 (CABLE) ×1 IMPLANT
CATH RIGHTSITE C315HIS02 (CATHETERS) IMPLANT
IPG PACE AZUR XT DR MRI W1DR01 (Pacemaker) IMPLANT
KIT MICROPUNCTURE NIT STIFF (SHEATH) IMPLANT
LEAD CAPSURE NOVUS 45CM (Lead) IMPLANT
LEAD SELECT SECURE 3830 383069 (Lead) IMPLANT
PACE AZURE XT DR MRI W1DR01 (Pacemaker) ×1 IMPLANT
PAD DEFIB RADIO PHYSIO CONN (PAD) ×1 IMPLANT
SELECT SECURE 3830 383069 (Lead) ×1 IMPLANT
SHEATH 7FR PRELUDE SNAP 13 (SHEATH) IMPLANT
SLITTER 6232ADJ (MISCELLANEOUS) IMPLANT
TRAY PACEMAKER INSERTION (PACKS) ×1 IMPLANT
WIRE HI TORQ VERSACORE-J 145CM (WIRE) IMPLANT

## 2023-09-24 NOTE — Telephone Encounter (Signed)
Pt called to ask why she was referred to GI. Please call to advise purpose of referral. Pt requested a call not Mychart message. Pt has to go and have a pacemaker placed today so please leave message if she does not answer.

## 2023-09-24 NOTE — Discharge Instructions (Addendum)
After Your Pacemaker   ACTIVITY Do not lift your arm above shoulder height for 1 week after your procedure. After 7 days, you may progress as below.  You should remove your sling 24 hours after your procedure, unless otherwise instructed by your provider.     Friday October 01, 2023  Saturday October 02, 2023 Sunday October 03, 2023 Monday October 04, 2023   Do not lift, push, pull, or carry anything over 10 pounds with the affected arm until 6 weeks (Friday November 05, 2023 ) after your procedure.   You may drive AFTER your wound check, unless you have been told otherwise by your provider.   Ask your healthcare provider when you can go back to work   INCISION/Dressing If large square, outer bandage is left in place, this can be removed tomorrow at 5:00 pm. Do not remove steri-strips.  Monitor your Pacemaker site for redness, swelling, and drainage. Call the device clinic at [phone] if you experience these symptoms or fever/chills.  If your incision is sealed with Steri-strips or staples, you may shower 7 days after your procedure or when told by your provider. Do not remove the steri-strips or let the shower hit directly on your site. You may wash around your site with soap and water.    If you were discharged in a sling, please do not wear this during the day more than 48 hours after your surgery unless otherwise instructed. This may increase the risk of stiffness and soreness in your shoulder.   Avoid lotions, ointments, or perfumes over your incision until it is well-healed.  You may use a hot tub or a pool AFTER your wound check appointment if the incision is completely closed.  Pacemaker Alerts:  Some alerts are vibratory and others beep. These are NOT emergencies. Please call our office to let us know. If this occurs at night or on weekends, it can wait until the next business day. Send a remote transmission.  If your device is capable of reading fluid status (for heart  failure), you will be offered monthly monitoring to review this with you.   DEVICE MANAGEMENT Remote monitoring is used to monitor your pacemaker from home. This monitoring is scheduled every 91 days by our office. It allows [place] to keep an eye on the functioning of your device to ensure it is working properly. You will routinely see your Electrophysiologist annually (more often if necessary).   You should receive your ID card for your new device in 4-8 weeks. Keep this card with you at all times once received. Consider wearing a medical alert bracelet or necklace.  Your Pacemaker may be MRI compatible. This will be discussed at your next office visit/wound check.  You should avoid contact with strong electric or magnetic fields.   Do not use amateur (ham) radio equipment or electric (arc) welding torches. MP3 player headphones with magnets should not be used. Some devices are safe to use if held at least 12 inches (30 cm) from your Pacemaker. These include power tools, lawn mowers, and speakers. If you are unsure if something is safe to use, ask your health care provider.  When using your cell phone, hold it to the ear that is on the opposite side from the Pacemaker. Do not leave your cell phone in a pocket over the Pacemaker.  You may safely use electric blankets, heating pads, computers, and microwave ovens.  Call the office right away if: You have chest pain. You feel more  short of breath than you have felt before. You feel more light-headed than you have felt before. Your incision starts to open up.  This information is not intended to replace advice given to you by your health care provider. Make sure you discuss any questions you have with your health care provider.

## 2023-09-24 NOTE — Telephone Encounter (Signed)
Spoke with patient. Pt advised she was referred due to her reflux sxs. Pt states she is going to wait to see GI due to pacemaker placement today and upcoming pulmonary appt. Pt states feeling overwhelmed.

## 2023-09-24 NOTE — Interval H&P Note (Signed)
History and Physical Interval Note:  09/24/2023 2:52 PM  Briana Craig  has presented today for surgery, with the diagnosis of heart block.  The various methods of treatment have been discussed with the patient and family. After consideration of risks, benefits and other options for treatment, the patient has consented to  Procedure(s): PACEMAKER IMPLANT (N/A) as a surgical intervention.  The patient's history has been reviewed, patient examined, no change in status, stable for surgery.  I have reviewed the patient's chart and labs.  Questions were answered to the patient's satisfaction.     Roberts Gaudy Grayce Budden

## 2023-09-27 ENCOUNTER — Encounter (HOSPITAL_COMMUNITY): Payer: Self-pay | Admitting: Cardiovascular Disease

## 2023-10-06 ENCOUNTER — Ambulatory Visit: Payer: Medicare PPO | Admitting: Cardiology

## 2023-10-07 ENCOUNTER — Ambulatory Visit: Payer: Medicare PPO | Attending: Cardiovascular Disease

## 2023-10-07 DIAGNOSIS — I459 Conduction disorder, unspecified: Secondary | ICD-10-CM

## 2023-10-07 LAB — CUP PACEART INCLINIC DEVICE CHECK
Battery Remaining Longevity: 178 mo
Battery Voltage: 3.22 V
Brady Statistic AP VP Percent: 0.13 %
Brady Statistic AP VS Percent: 8.05 %
Brady Statistic AS VP Percent: 1.69 %
Brady Statistic AS VS Percent: 90.12 %
Brady Statistic RA Percent Paced: 8.18 %
Brady Statistic RV Percent Paced: 1.82 %
Date Time Interrogation Session: 20241205095048
Implantable Lead Connection Status: 753985
Implantable Lead Connection Status: 753985
Implantable Lead Implant Date: 20241122
Implantable Lead Implant Date: 20241122
Implantable Lead Location: 753859
Implantable Lead Location: 753860
Implantable Lead Model: 3830
Implantable Lead Model: 5076
Implantable Pulse Generator Implant Date: 20241122
Lead Channel Impedance Value: 361 Ohm
Lead Channel Impedance Value: 399 Ohm
Lead Channel Impedance Value: 532 Ohm
Lead Channel Impedance Value: 551 Ohm
Lead Channel Pacing Threshold Amplitude: 0.375 V
Lead Channel Pacing Threshold Amplitude: 0.5 V
Lead Channel Pacing Threshold Amplitude: 1.75 V
Lead Channel Pacing Threshold Amplitude: 2 V
Lead Channel Pacing Threshold Pulse Width: 0.4 ms
Lead Channel Pacing Threshold Pulse Width: 0.4 ms
Lead Channel Pacing Threshold Pulse Width: 0.4 ms
Lead Channel Pacing Threshold Pulse Width: 0.4 ms
Lead Channel Sensing Intrinsic Amplitude: 3.25 mV
Lead Channel Sensing Intrinsic Amplitude: 3.375 mV
Lead Channel Sensing Intrinsic Amplitude: 3.875 mV
Lead Channel Sensing Intrinsic Amplitude: 3.875 mV
Lead Channel Setting Pacing Amplitude: 3.5 V
Lead Channel Setting Pacing Amplitude: 4 V
Lead Channel Setting Pacing Pulse Width: 0.4 ms
Lead Channel Setting Sensing Sensitivity: 1.2 mV
Zone Setting Status: 755011
Zone Setting Status: 755011

## 2023-10-07 NOTE — Patient Instructions (Signed)

## 2023-10-07 NOTE — Progress Notes (Signed)
Wound check appointment. Steri-strips removed. Wound without redness or edema. Incision edges approximated, wound well healed. Normal device function. Thresholds, sensing, and impedances consistent with implant measurements. Device programmed at 3.5V/auto capture programmed on for extra safety margin until 3 month visit. Histogram distribution appropriate for patient and level of activity. No mode switches or high ventricular rates noted. Patient educated about wound care, arm mobility, lifting restrictions. ROV in 3 months with implanting physician.  Upon presenting, patient's EGM was noted to have double R waves intermittently. Reprogrammed RV sensitivity from 0.9 to 1.69mv. This change corrected the sensing issue. Spoke with industry rep who evaluated the presenting EGM and RV testing. Rep agrees that the change made was appropriate. No other concerns at this time from the device rep. Will forward to physician for review.

## 2023-10-12 ENCOUNTER — Encounter: Payer: Self-pay | Admitting: Emergency Medicine

## 2023-10-12 ENCOUNTER — Ambulatory Visit: Payer: Medicare PPO | Admitting: Emergency Medicine

## 2023-10-12 VITALS — BP 150/88 | HR 78 | Temp 98.4°F | Ht 62.0 in | Wt 116.0 lb

## 2023-10-12 DIAGNOSIS — R9389 Abnormal findings on diagnostic imaging of other specified body structures: Secondary | ICD-10-CM

## 2023-10-12 DIAGNOSIS — Z87891 Personal history of nicotine dependence: Secondary | ICD-10-CM

## 2023-10-12 NOTE — Progress Notes (Signed)
Subjective:    Patient ID: Briana Craig, female    DOB: 1946-06-09, 77 y.o.   MRN: [id number]  HPI A 77 year old former smoker with a history of esophageal cancer treated with adjuvant therapy and surgical resection in 2012, and DCIS of the right breast, presents for a consultation. The patient has been under surveillance with serial imaging, revealing scattered tree and bud opacities on CT. The most recent CT scan from 2019 showed post-surgical changes and peripheral tree-in-bud opacities bilaterally, raising suspicion for possible opportunistic mycobacterial infection or chronic aspiration. Stable waxing and waning nodules were also noted.  The patient was referred for a recent CT chest done in October 2024, which showed scattered interstitial and tree and bud nodular opacities similar to the previous study, as well as scattered nodules including an area of cavitation in the right upper lobe measuring 11 millimeters.  The patient reports experiencing reflux, particularly after eating spicy food in the evening, and sleeps with a wedge pillow to manage this. She also reports episodes of dizziness and shortness of breath, which have improved since the recent placement of a pacemaker. The patient was prescribed a five-day course of erythromycin, which seemed to alleviate some symptoms.  The patient has a strong desire to avoid further invasive treatments and radiation, having already undergone extensive treatment for esophageal cancer. She expresses understanding of the risks associated with her condition and the potential for recurrent low-grade inflammation due to aspiration.     RADIOLOGY CT chest: Post-surgical changes and peripheral tree-in-bud opacities bilaterally suspicious for possible opportunistic mycobacterial infection or chronic aspiration. Other waxing and waning nodules overall stable. (04/29/2018) CT chest: Scattered interstitial and tree-in-bud nodular opacities similar to  previous study, scattered nodules including an area of cavitation in the right upper lobe measuring 11 mm. (08/24/2023)   Review of Systems As per HPI  Past Medical History:  Diagnosis Date   Breast cancer (HCC) 12/2018   right DCIS   Dysphagia    Esophageal cancer (HCC)    GERD (gastroesophageal reflux disease)    Hyperlipemia    Hypertension    no meds now   Impaired fasting blood sugar    Personal history of radiation therapy    PONV (postoperative nausea and vomiting)    Pre-diabetes      Family History  Problem Relation Age of Onset   Alcohol abuse Sister    AAA (abdominal aortic aneurysm) Mother    COPD Father    Colon cancer Neg Hx      Social History   Socioeconomic History   Marital status: Widowed    Spouse name: Briana Craig   Number of children: 0   Years of education: 18   Highest education level: Not on file  Occupational History   Occupation: RETIRED     Employer: OTHER  Tobacco Use   Smoking status: Former    Current packs/day: 0.00    Types: Cigarettes    Quit date: 08/30/1987    Years since quitting: 36.1   Smokeless tobacco: Never  Substance and Sexual Activity   Alcohol use: No   Drug use: Never   Sexual activity: Yes    Partners: Male    Birth control/protection: Post-menopausal  Other Topics Concern   Not on file  Social History Narrative   Marital Status:  Married Briana Craig)   Children:  None    Pets: Dog (1)    Living Situation: Lives with husband.   Occupation:  Retired Loss adjuster, chartered)  Education: Master's Degree    Tobacco Use/Exposure:  Formal Smoker    Alcohol Use:  Occasional   Drug Use:  None   Diet:  Regular   Exercise:  Walking 1 mile 5 times per week.    Hobbies:  Reading                Social Determinants of Health   Financial Resource Strain: Low Risk  (01/06/2022)   Overall Financial Resource Strain (CARDIA)    Difficulty of Paying Living Expenses: Not hard at all  Food Insecurity: No Food Insecurity (09/11/2022)    Hunger Vital Sign    Worried About Running Out of Food in the Last Year: Never true    Ran Out of Food in the Last Year: Never true  Transportation Needs: No Transportation Needs (09/11/2022)   PRAPARE - Administrator, Civil Service (Medical): No    Lack of Transportation (Non-Medical): No  Physical Activity: Sufficiently Active (01/06/2022)   Exercise Vital Sign    Days of Exercise per Week: 7 days    Minutes of Exercise per Session: 60 min  Stress: No Stress Concern Present (12/19/2020)   [employer] of Occupational Health - Occupational Stress Questionnaire    Feeling of Stress : Not at all  Social Connections: Socially Isolated (01/06/2022)   Social Connection and Isolation Panel [NHANES]    Frequency of Communication with Friends and Family: More than three times a week    Frequency of Social Gatherings with Friends and Family: More than three times a week    Attends Religious Services: Never    Database administrator or Organizations: No    Attends Banker Meetings: Never    Marital Status: Widowed  Intimate Partner Violence: Not At Risk (01/11/2023)   Humiliation, Afraid, Rape, and Kick questionnaire    Fear of Current or Ex-Partner: No    Emotionally Abused: No    Physically Abused: No    Sexually Abused: No     Allergies  Allergen Reactions   Taxol [Paclitaxel] Hives and Itching   Tramadol     "Sweaty, shaky and faint"   Carboplatin Hives and Itching   Codeine Other (See Comments)    Could not sleep, Shaky and Sweaty   Compazine  [Prochlorperazine Maleate] Other (See Comments)    Severe chest pain   Penicillins Other (See Comments)    Childhood   Prochlorperazine Edisylate     Other reaction(s): Other (See Comments) Severe chest pain     Outpatient Medications Prior to Visit  Medication Sig Dispense Refill   Accu-Chek Softclix Lancets lancets Use to check blood sugar once a day.  DX  E11.9 100 each 1   Alcohol Swabs (ALCOHOL WIPES)  70 % PADS To use to check sugars 100 each 1   Blood Glucose Calibration (ACCU-CHEK AVIVA) SOLN Use to check control when opening a new bottle of strips.  DX E11.9 1 each 1   Blood Glucose Monitoring Suppl (ACCU-CHEK AVIVA PLUS) w/Device KIT Use to check blood sugar once a day.  DX E11.9 1 kit 0   Blood Glucose Monitoring Suppl (TRUE METRIX AIR GLUCOSE METER) DEVI Use as directed to check glucose twice daily. E11.9 1 each 0   cetirizine (ZYRTEC) 10 MG tablet Take 10 mg by mouth daily as needed for allergies.     Coenzyme Q10 (CO Q 10 PO) Take 1 Cartridge by mouth daily.     famotidine (PEPCID) 40 MG tablet  Take 1 tablet (40 mg total) by mouth at bedtime. 90 tablet 1   Glucos-Chondroit-Hyaluron-MSM (GLUCOSAMINE CHONDROITIN JOINT PO) Take 2,000 mg by mouth daily.     glucose blood (ACCU-CHEK AVIVA PLUS) test strip Use to check blood sugar once daily.  DX  E 11.9 100 strip 1   latanoprost (XALATAN) 0.005 % ophthalmic solution Place 1 drop into both eyes at bedtime.     Multiple Vitamins-Minerals (MULTIVITAMIN PO) Take 1 tablet by mouth daily. Centrum silver 50+     Multiple Vitamins-Minerals (PRESERVISION AREDS) CAPS Take 1 capsule by mouth 2 (two) times daily.     Omega-3 Fatty Acids (FISH OIL PO) Take 1,200 mg by mouth daily. Krill Oil     pantoprazole (PROTONIX) 40 MG tablet Take 1 tablet (40 mg total) by mouth daily. 90 tablet 1   simvastatin (ZOCOR) 20 MG tablet Take 1 tablet (20 mg total) by mouth daily at 6 PM. 90 tablet 1   TRUEplus Lancets 33G MISC Use as directed. Check glucose up to twice daily. E11.9 100 each 11   No facility-administered medications prior to visit.         Objective:   Physical Exam Today's Vitals   10/12/23 1515  BP: (!) 150/88  Pulse: 78  Temp: 98.4 F (36.9 C)  TempSrc: Oral  SpO2: 93%  Weight: 116 lb (52.6 kg)  Height: 5\' 2"  (1.575 m)     Gen: Pleasant, well-nourished, in no distress,  normal affect  ENT: No lesions,  mouth clear,  oropharynx  clear, no postnasal drip  Neck: No JVD, no stridor  Lungs: No use of accessory muscles, no crackles or wheezing on normal respiration, no wheeze on forced expiration  Cardiovascular: RRR, heart sounds normal, no murmur or gallops, no peripheral edema  Musculoskeletal: No deformities, no cyanosis or clubbing  Neuro: alert, awake, non focal  Skin: Warm, no lesions or rash      Assessment & Plan:  Abnormal CT of the chest Pulmonary Nodules New right upper lobe cavitary nodule measuring 11mm on CT chest from 08/24/2023. History of esophageal cancer and DCIS. Chronic aspiration due to reflux that is likely contributing to smoldering inflammatory change, tree-in-bud abnormalities and scarring.  Consider also Mycobacterium. No current symptoms suggestive of active infection or malignancy. Patient preference to avoid invasive procedures and radiation.  I suspect that this reflects a dilated bronchiectatic airway although reviewed with the patient that malignancy is on the differential diagnosis. -Repeat CT chest in 6 months (April 2025) to assess for changes in nodules.   Follow-up in April 2025 to review repeat CT chest.   Levy Pupa, MD, PhD 10/12/2023, 3:40 PM [facility] [phone] or if no answer before 7:00PM call [phone] For any issues after 7:00PM please call eLink [phone]

## 2023-10-12 NOTE — Patient Instructions (Signed)
VISIT SUMMARY:  You had a consultation today to review your recent CT chest scan and discuss your ongoing health concerns. We reviewed your history of esophageal cancer, DCIS, and recent symptoms including reflux, dizziness, and shortness of breath. Your recent CT scan showed some changes in your lungs, and we discussed the best ways to manage your symptoms and monitor your condition moving forward.  YOUR PLAN:  -PULMONARY NODULES: Pulmonary nodules are small growths in the lungs. Your recent CT scan showed a new nodule in your right upper lung. We will repeat the CT scan in six months to monitor any changes.  -GASTROESOPHAGEAL REFLUX DISEASE (GERD): GERD is a condition where stomach acid frequently flows back into the tube connecting your mouth and stomach. You are experiencing reflux, especially after eating spicy foods and when lying flat. Continue using your wedge pillow and avoid spicy foods to help manage this.  -CARDIAC PACEMAKER: A pacemaker is a device that helps control abnormal heart rhythms. You recently had a pacemaker placed, which has improved your dizziness and shortness of breath. No changes to your current management are needed.  INSTRUCTIONS:  Please schedule a follow-up appointment in April 2025 to review your repeat CT chest scan.

## 2023-10-12 NOTE — Assessment & Plan Note (Signed)
Pulmonary Nodules New right upper lobe cavitary nodule measuring 11mm on CT chest from 08/24/2023. History of esophageal cancer and DCIS. Chronic aspiration due to reflux that is likely contributing to smoldering inflammatory change, tree-in-bud abnormalities and scarring.  Consider also Mycobacterium. No current symptoms suggestive of active infection or malignancy. Patient preference to avoid invasive procedures and radiation.  I suspect that this reflects a dilated bronchiectatic airway although reviewed with the patient that malignancy is on the differential diagnosis. -Repeat CT chest in 6 months (April 2025) to assess for changes in nodules.   Follow-up in April 2025 to review repeat CT chest.

## 2023-11-08 DIAGNOSIS — H353231 Exudative age-related macular degeneration, bilateral, with active choroidal neovascularization: Secondary | ICD-10-CM | POA: Diagnosis not present

## 2023-11-08 DIAGNOSIS — H43813 Vitreous degeneration, bilateral: Secondary | ICD-10-CM | POA: Diagnosis not present

## 2023-11-09 DIAGNOSIS — H43813 Vitreous degeneration, bilateral: Secondary | ICD-10-CM | POA: Diagnosis not present

## 2023-11-09 DIAGNOSIS — H353221 Exudative age-related macular degeneration, left eye, with active choroidal neovascularization: Secondary | ICD-10-CM | POA: Diagnosis not present

## 2023-11-09 DIAGNOSIS — H35373 Puckering of macula, bilateral: Secondary | ICD-10-CM | POA: Diagnosis not present

## 2023-11-09 DIAGNOSIS — Z961 Presence of intraocular lens: Secondary | ICD-10-CM | POA: Diagnosis not present

## 2023-11-24 ENCOUNTER — Other Ambulatory Visit: Payer: Self-pay | Admitting: Family Medicine

## 2023-11-24 DIAGNOSIS — C159 Malignant neoplasm of esophagus, unspecified: Secondary | ICD-10-CM

## 2023-11-26 ENCOUNTER — Emergency Department (HOSPITAL_BASED_OUTPATIENT_CLINIC_OR_DEPARTMENT_OTHER): Payer: Medicare PPO

## 2023-11-26 ENCOUNTER — Encounter (HOSPITAL_BASED_OUTPATIENT_CLINIC_OR_DEPARTMENT_OTHER): Payer: Self-pay | Admitting: Emergency Medicine

## 2023-11-26 ENCOUNTER — Inpatient Hospital Stay (HOSPITAL_BASED_OUTPATIENT_CLINIC_OR_DEPARTMENT_OTHER)
Admission: EM | Admit: 2023-11-26 | Discharge: 2023-12-02 | DRG: 243 | Disposition: A | Discharge status: Home / Self Care | Payer: Medicare PPO | Attending: Cardiology | Admitting: Cardiology

## 2023-11-26 ENCOUNTER — Other Ambulatory Visit: Payer: Self-pay

## 2023-11-26 ENCOUNTER — Inpatient Hospital Stay (HOSPITAL_COMMUNITY)
Admission: EM | Disposition: A | Discharge status: Home / Self Care | Payer: Self-pay | Source: Home / Self Care | Attending: Cardiology

## 2023-11-26 DIAGNOSIS — I700 Atherosclerosis of aorta: Secondary | ICD-10-CM | POA: Diagnosis not present

## 2023-11-26 DIAGNOSIS — K219 Gastro-esophageal reflux disease without esophagitis: Secondary | ICD-10-CM | POA: Diagnosis present

## 2023-11-26 DIAGNOSIS — J90 Pleural effusion, not elsewhere classified: Secondary | ICD-10-CM | POA: Diagnosis not present

## 2023-11-26 DIAGNOSIS — Y712 Prosthetic and other implants, materials and accessory cardiovascular devices associated with adverse incidents: Secondary | ICD-10-CM | POA: Diagnosis present

## 2023-11-26 DIAGNOSIS — Z9221 Personal history of antineoplastic chemotherapy: Secondary | ICD-10-CM | POA: Diagnosis not present

## 2023-11-26 DIAGNOSIS — R0600 Dyspnea, unspecified: Secondary | ICD-10-CM | POA: Diagnosis not present

## 2023-11-26 DIAGNOSIS — Z923 Personal history of irradiation: Secondary | ICD-10-CM

## 2023-11-26 DIAGNOSIS — Z888 Allergy status to other drugs, medicaments and biological substances status: Secondary | ICD-10-CM | POA: Diagnosis not present

## 2023-11-26 DIAGNOSIS — Z1152 Encounter for screening for COVID-19: Secondary | ICD-10-CM | POA: Diagnosis not present

## 2023-11-26 DIAGNOSIS — Z79899 Other long term (current) drug therapy: Secondary | ICD-10-CM

## 2023-11-26 DIAGNOSIS — Z87891 Personal history of nicotine dependence: Secondary | ICD-10-CM

## 2023-11-26 DIAGNOSIS — Z950 Presence of cardiac pacemaker: Secondary | ICD-10-CM

## 2023-11-26 DIAGNOSIS — T82897A Other specified complication of cardiac prosthetic devices, implants and grafts, initial encounter: Secondary | ICD-10-CM | POA: Diagnosis not present

## 2023-11-26 DIAGNOSIS — R001 Bradycardia, unspecified: Secondary | ICD-10-CM | POA: Diagnosis present

## 2023-11-26 DIAGNOSIS — J984 Other disorders of lung: Secondary | ICD-10-CM | POA: Diagnosis not present

## 2023-11-26 DIAGNOSIS — Z885 Allergy status to narcotic agent status: Secondary | ICD-10-CM | POA: Diagnosis not present

## 2023-11-26 DIAGNOSIS — J929 Pleural plaque without asbestos: Secondary | ICD-10-CM | POA: Diagnosis not present

## 2023-11-26 DIAGNOSIS — I771 Stricture of artery: Secondary | ICD-10-CM | POA: Diagnosis not present

## 2023-11-26 DIAGNOSIS — Z8501 Personal history of malignant neoplasm of esophagus: Secondary | ICD-10-CM

## 2023-11-26 DIAGNOSIS — I82B12 Acute embolism and thrombosis of left subclavian vein: Secondary | ICD-10-CM | POA: Diagnosis not present

## 2023-11-26 DIAGNOSIS — J939 Pneumothorax, unspecified: Secondary | ICD-10-CM | POA: Diagnosis not present

## 2023-11-26 DIAGNOSIS — E785 Hyperlipidemia, unspecified: Secondary | ICD-10-CM | POA: Diagnosis present

## 2023-11-26 DIAGNOSIS — I10 Essential (primary) hypertension: Secondary | ICD-10-CM | POA: Diagnosis not present

## 2023-11-26 DIAGNOSIS — R911 Solitary pulmonary nodule: Secondary | ICD-10-CM | POA: Diagnosis not present

## 2023-11-26 DIAGNOSIS — I442 Atrioventricular block, complete: Secondary | ICD-10-CM | POA: Diagnosis not present

## 2023-11-26 DIAGNOSIS — J95811 Postprocedural pneumothorax: Secondary | ICD-10-CM | POA: Diagnosis not present

## 2023-11-26 DIAGNOSIS — Z880 Allergy status to penicillin: Secondary | ICD-10-CM

## 2023-11-26 DIAGNOSIS — Z853 Personal history of malignant neoplasm of breast: Secondary | ICD-10-CM

## 2023-11-26 DIAGNOSIS — R7303 Prediabetes: Secondary | ICD-10-CM | POA: Diagnosis present

## 2023-11-26 DIAGNOSIS — J9811 Atelectasis: Secondary | ICD-10-CM | POA: Diagnosis not present

## 2023-11-26 DIAGNOSIS — Z4682 Encounter for fitting and adjustment of non-vascular catheter: Secondary | ICD-10-CM | POA: Diagnosis not present

## 2023-11-26 DIAGNOSIS — T82118A Breakdown (mechanical) of other cardiac electronic device, initial encounter: Secondary | ICD-10-CM | POA: Diagnosis not present

## 2023-11-26 DIAGNOSIS — R0602 Shortness of breath: Secondary | ICD-10-CM | POA: Diagnosis not present

## 2023-11-26 DIAGNOSIS — T82190A Other mechanical complication of cardiac electrode, initial encounter: Secondary | ICD-10-CM | POA: Diagnosis not present

## 2023-11-26 DIAGNOSIS — R918 Other nonspecific abnormal finding of lung field: Secondary | ICD-10-CM | POA: Diagnosis not present

## 2023-11-26 HISTORY — PX: LEAD REVISION/REPAIR: EP1213

## 2023-11-26 LAB — CBC WITH DIFFERENTIAL/PLATELET
Abs Immature Granulocytes: 0.03 10*3/uL (ref 0.00–0.07)
Basophils Absolute: 0 10*3/uL (ref 0.0–0.1)
Basophils Relative: 1 %
Eosinophils Absolute: 0.1 10*3/uL (ref 0.0–0.5)
Eosinophils Relative: 1 %
HCT: 34.7 % — ABNORMAL LOW (ref 36.0–46.0)
Hemoglobin: 11.7 g/dL — ABNORMAL LOW (ref 12.0–15.0)
Immature Granulocytes: 1 %
Lymphocytes Relative: 18 %
Lymphs Abs: 1.1 10*3/uL (ref 0.7–4.0)
MCH: 30.3 pg (ref 26.0–34.0)
MCHC: 33.7 g/dL (ref 30.0–36.0)
MCV: 89.9 fL (ref 80.0–100.0)
Monocytes Absolute: 0.5 10*3/uL (ref 0.1–1.0)
Monocytes Relative: 8 %
Neutro Abs: 4.5 10*3/uL (ref 1.7–7.7)
Neutrophils Relative %: 71 %
Platelets: 183 10*3/uL (ref 150–400)
RBC: 3.86 MIL/uL — ABNORMAL LOW (ref 3.87–5.11)
RDW: 14.8 % (ref 11.5–15.5)
WBC: 6.2 10*3/uL (ref 4.0–10.5)
nRBC: 0 % (ref 0.0–0.2)

## 2023-11-26 LAB — COMPREHENSIVE METABOLIC PANEL
ALT: 60 U/L — ABNORMAL HIGH (ref 0–44)
AST: 70 U/L — ABNORMAL HIGH (ref 15–41)
Albumin: 3.9 g/dL (ref 3.5–5.0)
Alkaline Phosphatase: 65 U/L (ref 38–126)
Anion gap: 10 (ref 5–15)
BUN: 21 mg/dL (ref 8–23)
CO2: 23 mmol/L (ref 22–32)
Calcium: 9.6 mg/dL (ref 8.9–10.3)
Chloride: 107 mmol/L (ref 98–111)
Creatinine, Ser: 1.08 mg/dL — ABNORMAL HIGH (ref 0.44–1.00)
GFR, Estimated: 52 mL/min — ABNORMAL LOW (ref >60)
Glucose, Bld: 161 mg/dL — ABNORMAL HIGH (ref 70–99)
Potassium: 4.7 mmol/L (ref 3.5–5.1)
Sodium: 140 mmol/L (ref 135–145)
Total Bilirubin: 0.8 mg/dL (ref 0.0–1.2)
Total Protein: 7.1 g/dL (ref 6.5–8.1)

## 2023-11-26 LAB — TROPONIN I (HIGH SENSITIVITY)
Troponin I (High Sensitivity): 18 ng/L — ABNORMAL HIGH (ref <18)
Troponin I (High Sensitivity): 21 ng/L — ABNORMAL HIGH (ref <18)

## 2023-11-26 LAB — SURGICAL PCR SCREEN
MRSA, PCR: NEGATIVE
Staphylococcus aureus: NEGATIVE

## 2023-11-26 LAB — RESP PANEL BY RT-PCR (RSV, FLU A&B, COVID)  RVPGX2
Influenza A by PCR: NEGATIVE
Influenza B by PCR: NEGATIVE
Resp Syncytial Virus by PCR: NEGATIVE
SARS Coronavirus 2 by RT PCR: NEGATIVE

## 2023-11-26 LAB — BRAIN NATRIURETIC PEPTIDE: B Natriuretic Peptide: 1583 pg/mL — ABNORMAL HIGH (ref 0.0–100.0)

## 2023-11-26 SURGERY — LEAD REVISION/REPAIR
Anesthesia: LOCAL

## 2023-11-26 MED ORDER — LATANOPROST 0.005 % OP SOLN
1.0000 [drp] | Freq: Every day | OPHTHALMIC | Status: DC
  Administered 2023-11-27 – 2023-12-01 (×5): 1 [drp] via OPHTHALMIC
  Filled 2023-11-26: qty 2.5

## 2023-11-26 MED ORDER — VANCOMYCIN HCL IN DEXTROSE 1-5 GM/200ML-% IV SOLN
1000.0000 mg | INTRAVENOUS | Status: AC
  Administered 2023-11-26: 1000 mg via INTRAVENOUS
  Filled 2023-11-26: qty 200

## 2023-11-26 MED ORDER — LIDOCAINE HCL (PF) 1 % IJ SOLN
INTRAMUSCULAR | Status: DC | PRN
  Administered 2023-11-26: 60 mL

## 2023-11-26 MED ORDER — MIDAZOLAM HCL 5 MG/5ML IJ SOLN
INTRAMUSCULAR | Status: DC | PRN
  Administered 2023-11-26 (×2): 1 mg via INTRAVENOUS

## 2023-11-26 MED ORDER — FENTANYL CITRATE (PF) 100 MCG/2ML IJ SOLN
INTRAMUSCULAR | Status: DC | PRN
  Administered 2023-11-26 (×2): 25 ug via INTRAVENOUS

## 2023-11-26 MED ORDER — SODIUM CHLORIDE 0.9% FLUSH: 3.0000 mL | INTRAVENOUS | Status: DC | PRN

## 2023-11-26 MED ORDER — FUROSEMIDE 10 MG/ML IJ SOLN: 20.0000 mg | Freq: Once | INTRAMUSCULAR | Status: DC

## 2023-11-26 MED ORDER — CHLORHEXIDINE GLUCONATE 4 % EX SOLN: 60.0000 mL | Freq: Once | CUTANEOUS | Status: DC

## 2023-11-26 MED ORDER — SIMVASTATIN 20 MG PO TABS
20.0000 mg | ORAL_TABLET | Freq: Every day | ORAL | Status: DC
  Administered 2023-11-27 – 2023-12-01 (×5): 20 mg via ORAL
  Filled 2023-11-26 (×5): qty 1

## 2023-11-26 MED ORDER — PANTOPRAZOLE SODIUM 40 MG PO TBEC
40.0000 mg | DELAYED_RELEASE_TABLET | Freq: Every day | ORAL | Status: DC
  Administered 2023-11-27 – 2023-12-02 (×6): 40 mg via ORAL
  Filled 2023-11-26 (×6): qty 1

## 2023-11-26 MED ORDER — HEPARIN (PORCINE) IN NACL 1000-0.9 UT/500ML-% IV SOLN
INTRAVENOUS | Status: DC | PRN
  Administered 2023-11-26: 500 mL

## 2023-11-26 MED ORDER — FENTANYL CITRATE (PF) 100 MCG/2ML IJ SOLN
INTRAMUSCULAR | Status: AC
  Filled 2023-11-26: qty 2

## 2023-11-26 MED ORDER — ONDANSETRON HCL 4 MG/2ML IJ SOLN
4.0000 mg | Freq: Four times a day (QID) | INTRAMUSCULAR | Status: DC | PRN
  Administered 2023-11-27 – 2023-11-28 (×2): 4 mg via INTRAVENOUS
  Filled 2023-11-26 (×2): qty 2

## 2023-11-26 MED ORDER — SODIUM CHLORIDE 0.9 % IV SOLN: 250.0000 mL | INTRAVENOUS | Status: DC

## 2023-11-26 MED ORDER — SODIUM CHLORIDE 0.9% FLUSH
3.0000 mL | Freq: Two times a day (BID) | INTRAVENOUS | Status: DC
  Administered 2023-11-26 – 2023-11-29 (×6): 3 mL via INTRAVENOUS

## 2023-11-26 MED ORDER — FAMOTIDINE 20 MG PO TABS
20.0000 mg | ORAL_TABLET | Freq: Every day | ORAL | Status: DC
  Administered 2023-11-27 – 2023-12-01 (×5): 20 mg via ORAL
  Filled 2023-11-26 (×6): qty 1

## 2023-11-26 MED ORDER — VANCOMYCIN HCL IN DEXTROSE 1-5 GM/200ML-% IV SOLN
INTRAVENOUS | Status: AC
  Filled 2023-11-26: qty 200

## 2023-11-26 MED ORDER — ACETAMINOPHEN 325 MG PO TABS
325.0000 mg | ORAL_TABLET | ORAL | Status: DC | PRN
  Administered 2023-11-27 – 2023-12-01 (×11): 650 mg via ORAL
  Filled 2023-11-26 (×14): qty 2

## 2023-11-26 MED ORDER — SODIUM CHLORIDE 0.9% FLUSH: 3.0000 mL | Freq: Two times a day (BID) | INTRAVENOUS | Status: DC

## 2023-11-26 MED ORDER — MIDAZOLAM HCL 2 MG/2ML IJ SOLN
INTRAMUSCULAR | Status: AC
Start: 2023-11-26 — End: ?
  Filled 2023-11-26: qty 2

## 2023-11-26 MED ORDER — SODIUM CHLORIDE 0.9 % IV SOLN
80.0000 mg | INTRAVENOUS | Status: AC
  Administered 2023-11-26: 80 mg
  Filled 2023-11-26: qty 2

## 2023-11-26 MED ORDER — LIDOCAINE HCL (PF) 1 % IJ SOLN
INTRAMUSCULAR | Status: AC
  Filled 2023-11-26: qty 60

## 2023-11-26 MED ORDER — SODIUM CHLORIDE 0.9 % IV SOLN
INTRAVENOUS | Status: AC
  Filled 2023-11-26: qty 2

## 2023-11-26 MED ORDER — VANCOMYCIN HCL IN DEXTROSE 1-5 GM/200ML-% IV SOLN
1000.0000 mg | Freq: Two times a day (BID) | INTRAVENOUS | Status: AC
  Administered 2023-11-27: 1000 mg via INTRAVENOUS
  Filled 2023-11-26: qty 200

## 2023-11-26 SURGICAL SUPPLY — 12 items
CATH RIGHTSITE C315HIS02 (CATHETERS) IMPLANT
GUIDEWIRE ANGLED .035X150CM (WIRE) IMPLANT
KIT WRENCH (KITS) IMPLANT
PAD DEFIB RADIO PHYSIO CONN (PAD) ×1 IMPLANT
POUCH AIGIS-R ANTIBACT PPM (Mesh General) ×1 IMPLANT
POUCH AIGIS-R ANTIBACT PPM MED (Mesh General) IMPLANT
SHEATH 7FR PRELUDE SNAP 13 (SHEATH) IMPLANT
SLITTER 6232ADJ (MISCELLANEOUS) IMPLANT
TRAY PACEMAKER INSERTION (PACKS) ×1 IMPLANT
WIRE HI TORQ VERSACORE-J 145CM (WIRE) IMPLANT
WIRE MICRO SET SILHO 5FR 7 (SHEATH) IMPLANT
WIRE MICROINTRODUCER 60CM (WIRE) IMPLANT

## 2023-11-26 NOTE — Progress Notes (Addendum)
At the end of today's procedure in d/w industry thresholds remained the same, and outpt programmed to  6V/1.46ms, unipolar  Francis Dowse, PA-C

## 2023-11-26 NOTE — ED Triage Notes (Signed)
Started to have sob x 2 -3 weeks ago and denies cp  but  was vomiting with the reflux has hx of pacemeaker

## 2023-11-26 NOTE — H&P (Addendum)
H&P   Patient ID: Briana Craig MRN: [id number]; DOB: 1946/09/05  Admit date: 11/26/2023 Date of Consult: 11/26/2023  PCP:  Donato Schultz, DO   [facility] Providers Cardiologist:  None        Patient Profile:   Briana Craig is a 78 y.o. female with a hx of HTN, HLD, esophageal Ca (tx w/chemo and XRT), symptomatic bradycardia with conduction system disease including CHB > s/p PPM 09/24/23 who is being seen 11/26/2023 for admission 2/2 malfunctioning pacemaker.Marland Kitchen  History of Present Illness:   Briana Craig underwent MDT dual chamber PPM on 09/24/23 by Dr. Nelly Laurence, uncomplicated procedure, home as usual.   She had her wound check visit with note reported "double R waves"  corrected by adjustment in her sensitivity. Otherwise measurements/thresholds similar to implant.  She sought attention today at the ER with reports of 3 weeks of progressive SOB, reported similar sx pre-pacing, felt transiently better though progressively worse again. She was found to have CHB 40's, with lack of pacer capture Transferred to [facility] for further management  LABS K+ 4.7 BUN/Creat 21/1.08 BNP 1,583.0 WBC 6.2 H/H 11/34 Plts 183  Today device interrogation Battery is good RV lead with inconsistent capture in the RV 6V/0.4 3.75/1.0 bipolar 3.25/1.0 unipolar A Lead lead measurements are stable  RV lead programmed 5V/1.0, unipolar MVP turned off  She has been feeling intermittently SOB for a few weeks, probably most/all of January, in the last 3 days though acutely much worse, unable to walk across the room, weak spells, near syncope, but no syncope. No CP   Past Medical History:  Diagnosis Date   Breast cancer (HCC) 12/2018   right DCIS   Dysphagia    Esophageal cancer (HCC)    GERD (gastroesophageal reflux disease)    Hyperlipemia    Hypertension    no meds now   Impaired fasting blood sugar    Personal history of radiation therapy    PONV (postoperative nausea  and vomiting)    Pre-diabetes     Past Surgical History:  Procedure Laterality Date   BREAST BIOPSY Right 12/23/2018   BREAST LUMPECTOMY Right 01/18/2019   BREAST LUMPECTOMY WITH RADIOACTIVE SEED LOCALIZATION Right 01/18/2019   Procedure: RIGHT BREAST CENTRAL LUMPECTOMY WITH RADIOACTIVE SEED LOCALIZATION;  Surgeon: Emelia Loron, MD;  Location: [facility];  Service: General;  Laterality: Right;   DIRECT LARYNGOSCOPY WITH BOTOX INJECTION     tx paralysed vocal cord   ESOPHAGOGASTRODUODENOSCOPY  11/17/2011   Procedure: ESOPHAGOGASTRODUODENOSCOPY (EGD);  Surgeon: Rob Bunting, MD;  Location: [facility];  Service: Endoscopy;  Laterality: N/A;   ESOPHAGUS SURGERY     EYE SURGERY     both cataracts   GANGLION CYST EXCISION Left 01/26/2013   Procedure: LEFT FLEXOR CARPI RADIALIS  RELEASE AND SCAPHO TRAPEZIAL TRAPEZOID DEBRIDEMENT ;  Surgeon: Tami Ribas, MD;  Location: [facility];  Service: Orthopedics;  Laterality: Left;   PACEMAKER IMPLANT N/A 09/24/2023   Procedure: PACEMAKER IMPLANT;  Surgeon: Maurice Small, MD;  Location: [facility];  Service: Cardiovascular;  Laterality: N/A;     Home Medications:  Prior to Admission medications   Medication Sig Start Date End Date Taking? Authorizing Provider  cetirizine (ZYRTEC) 10 MG tablet Take 10 mg by mouth daily as needed for allergies.   Yes [provider]  Coenzyme Q10 (CO Q 10 PO) Take 1 capsule by mouth at bedtime. Gel capsule   Yes [provider]  famotidine (PEPCID) 40 MG tablet Take 1 tablet (40 mg total) by mouth at bedtime. 08/09/23  Yes Seabron Spates R, DO  Glucos-Chondroit-Hyaluron-MSM (GLUCOSAMINE CHONDROITIN JOINT PO) Take 2,000 mg by mouth daily.   Yes [provider]  latanoprost (XALATAN) 0.005 % ophthalmic solution Place 1 drop into both eyes at bedtime. 10/29/17  Yes [provider]  Multiple Vitamins-Minerals (MULTIVITAMIN PO) Take 1  tablet by mouth daily. Centrum silver 50+   Yes [provider]  Multiple Vitamins-Minerals (PRESERVISION AREDS) CAPS Take 1 capsule by mouth 2 (two) times daily.   Yes [provider]  Omega-3 Fatty Acids (FISH OIL PO) Take 1,200 mg by mouth daily. Krill Oil   Yes [provider]  pantoprazole (PROTONIX) 40 MG tablet Take 1 tablet (40 mg total) by mouth daily. 11/24/23  Yes Seabron Spates R, DO  simvastatin (ZOCOR) 20 MG tablet Take 1 tablet (20 mg total) by mouth daily at 6 PM. 08/09/23  Yes Donato Schultz, DO  Accu-Chek Softclix Lancets lancets Use to check blood sugar once a day.  DX  E11.9 12/20/19   Donato Schultz, DO  Alcohol Swabs (ALCOHOL WIPES) 70 % PADS To use to check sugars 03/13/20   Zola Button, Grayling Congress, DO  Blood Glucose Calibration (ACCU-CHEK AVIVA) SOLN Use to check control when opening a new bottle of strips.  DX E11.9 12/20/19   Donato Schultz, DO  Blood Glucose Monitoring Suppl (ACCU-CHEK AVIVA PLUS) w/Device KIT Use to check blood sugar once a day.  DX E11.9 06/10/22   Donato Schultz, DO  Blood Glucose Monitoring Suppl (TRUE METRIX AIR GLUCOSE METER) DEVI Use as directed to check glucose twice daily. E11.9 03/08/20   Seabron Spates R, DO  glucose blood (ACCU-CHEK AVIVA PLUS) test strip Use to check blood sugar once daily.  DX  E 11.9 11/30/22   Lowne Chase, Yvonne R, DO  TRUEplus Lancets 33G MISC Use as directed. Check glucose up to twice daily. E11.9 03/08/20   Donato Schultz, DO    Inpatient Medications: Scheduled Meds:  Continuous Infusions:  PRN Meds:   Allergies:    Allergies  Allergen Reactions   Taxol [Paclitaxel] Hives and Itching   Tramadol Other (See Comments)    "Sweaty, shaky and faint"   Carboplatin Hives and Itching   Codeine Other (See Comments)    Could not sleep, Shaky and Sweaty   Compazine  [Prochlorperazine Maleate] Other (See Comments)    Severe chest pain   Penicillins Other (See  Comments)    Childhood   Prochlorperazine Edisylate Other (See Comments)    Severe chest pain    Social History:   Social History   Socioeconomic History   Marital status: Widowed    Spouse name: Dorene Sorrow   Number of children: 0   Years of education: 18   Highest education level: Not on file  Occupational History   Occupation: RETIRED     Employer: OTHER  Tobacco Use   Smoking status: Former    Current packs/day: 0.00    Types: Cigarettes    Quit date: 08/30/1987    Years since quitting: 36.2   Smokeless tobacco: Never  Substance and Sexual Activity   Alcohol use: No   Drug use: Never   Sexual activity: Yes    Partners: Male    Birth control/protection: Post-menopausal  Other Topics Concern   Not on file  Social History Narrative  Marital Status:  Married Dorene Sorrow)   Children:  None    Pets: Dog (1)    Living Situation: Lives with husband.   Occupation:  Retired Loss adjuster, chartered)    Education: Master's Degree    Tobacco Use/Exposure:  Formal Smoker    Alcohol Use:  Occasional   Drug Use:  None   Diet:  Regular   Exercise:  Walking 1 mile 5 times per week.    Hobbies:  Reading                Social Drivers of Health   Financial Resource Strain: Low Risk  (01/06/2022)   Overall Financial Resource Strain (CARDIA)    Difficulty of Paying Living Expenses: Not hard at all  Food Insecurity: No Food Insecurity (09/11/2022)   Hunger Vital Sign    Worried About Running Out of Food in the Last Year: Never true    Ran Out of Food in the Last Year: Never true  Transportation Needs: No Transportation Needs (09/11/2022)   PRAPARE - Administrator, Civil Service (Medical): No    Lack of Transportation (Non-Medical): No  Physical Activity: Sufficiently Active (01/06/2022)   Exercise Vital Sign    Days of Exercise per Week: 7 days    Minutes of Exercise per Session: 60 min  Stress: No Stress Concern Present (12/19/2020)   [employer] of Occupational Health -  Occupational Stress Questionnaire    Feeling of Stress : Not at all  Social Connections: Socially Isolated (01/06/2022)   Social Connection and Isolation Panel [NHANES]    Frequency of Communication with Friends and Family: More than three times a week    Frequency of Social Gatherings with Friends and Family: More than three times a week    Attends Religious Services: Never    Database administrator or Organizations: No    Attends Banker Meetings: Never    Marital Status: Widowed  Intimate Partner Violence: Not At Risk (01/11/2023)   Humiliation, Afraid, Rape, and Kick questionnaire    Fear of Current or Ex-Partner: No    Emotionally Abused: No    Physically Abused: No    Sexually Abused: No    Family History:   Family History  Problem Relation Age of Onset   Alcohol abuse Sister    AAA (abdominal aortic aneurysm) Mother    COPD Father    Colon cancer Neg Hx      ROS:  Please see the history of present illness.  All other ROS reviewed and negative.     Physical Exam/Data:   Vitals:   11/26/23 1042 11/26/23 1115 11/26/23 1230 11/26/23 1500  BP:  137/77 (!) 147/69 (!) 167/70  Pulse:  (!) 43 (!) 41   Resp:  (!) 9 19 15   Temp:    98.3 F (36.8 C)  TempSrc:    Oral  SpO2:  95% 96% 98%  Weight: 50.8 kg     Height: 5\' 2"  (1.575 m)      No intake or output data in the 24 hours ending 11/26/23 1518    11/26/2023   10:42 AM 10/12/2023    3:15 PM 09/24/2023    2:34 PM  Last 3 Weights  Weight (lbs) 112 lb 116 lb 113 lb  Weight (kg) 50.803 kg 52.617 kg 51.256 kg     Body mass index is 20.49 kg/m.  General:  Well nourished, well developed, in no acute distress HEENT: normal Neck: no JVD Vascular: No  carotid bruits Cardiac:  RRR; no murmurs, gallops or rubs Lungs:  CTA b/l, no wheezing, rhonchi or rales  Abd: soft, nontender Ext: no edema Musculoskeletal:  No deformities Skin: warm and dry  Neuro:  no focal abnormalities noted Psych:  Normal affect    EKG:  The EKG was personally reviewed and demonstrates:    CHB 45bpm, pacing without capture is appreciated  Telemetry:  Telemetry was personally reviewed and demonstrates:   CHB 40's >> post programming changes SR/V paced   Relevant CV Studies:  08/17/2023: TTE 1. Left ventricular ejection fraction, by estimation, is 60 to 65%. The  left ventricle has normal function. Left ventricular endocardial border  not optimally defined to evaluate regional wall motion. Wall motion  grossly norma. Left ventricular diastolic   parameters were normal.   2. Right ventricular systolic function is normal. The right ventricular  size is normal. There is normal pulmonary artery systolic pressure. The  estimated right ventricular systolic pressure is 33.5 mmHg.   3. The mitral valve is grossly normal. Trivial mitral valve  regurgitation. No evidence of mitral stenosis.   4. The aortic valve is tricuspid. Aortic valve regurgitation is mild. No  aortic stenosis is present.   5. The inferior vena cava is normal in size with greater than 50%  respiratory variability, suggesting right atrial pressure of 3 mmHg.   Comparison(s): No prior Echocardiogram.   Laboratory Data:  High Sensitivity Troponin:   Recent Labs  Lab 11/26/23 1058 11/26/23 1258  TROPONINIHS 18* 21*     Chemistry Recent Labs  Lab 11/26/23 1058  NA 140  K 4.7  CL 107  CO2 23  GLUCOSE 161*  BUN 21  CREATININE 1.08*  CALCIUM 9.6  GFRNONAA 52*  ANIONGAP 10    Recent Labs  Lab 11/26/23 1058  PROT 7.1  ALBUMIN 3.9  AST 70*  ALT 60*  ALKPHOS 65  BILITOT 0.8   Lipids No results for input(s): "CHOL", "TRIG", "HDL", "LABVLDL", "LDLCALC", "CHOLHDL" in the last 168 hours.  Hematology Recent Labs  Lab 11/26/23 1058  WBC 6.2  RBC 3.86*  HGB 11.7*  HCT 34.7*  MCV 89.9  MCH 30.3  MCHC 33.7  RDW 14.8  PLT 183   Thyroid No results for input(s): "TSH", "FREET4" in the last 168 hours.  BNP Recent Labs  Lab  11/26/23 1115  BNP 1,583.0*    DDimer No results for input(s): "DDIMER" in the last 168 hours.   Radiology/Studies:  DG Chest Portable 1 View Result Date: 11/26/2023 CLINICAL DATA:  dyspnea EXAM: PORTABLE CHEST 1 VIEW COMPARISON:  CXR 09/24/23 FINDINGS: Left-sided dual lead cardiac device with unchanged lead positioning. No pleural effusion. No pneumothorax. Unchanged cardiac and mediastinal contours. Surgical clips in the right axilla. No focal airspace opacity. There are prominent bilateral interstitial opacities that appear unchanged from prior exam. No radiographically apparent displaced rib fractures. Visualized upper abdomen unremarkable. IMPRESSION: Unchanged prominent bilateral interstitial opacities. No new focal airspace opacity. Electronically Signed   By: Lorenza Cambridge M.D.   On: 11/26/2023 11:48     Assessment and Plan:   PPM lead failure RV lead loss of capture Findings and programming changes as above With current programming she is pacing appropriately though Demetric Parslow need lead revision CXR with grossly stable lead position > likely micro dislodgement  Dr. Elberta Fortis has seen her Discussed findings and plans for lead revision Discussed procedure, potential risks benefits, she is agreeable to proceed   CHB As above  HTN  Home meds  GERD (severe) Hx of esophageal cancer/XRT/surgery) Keep HOB up 15degrees Home meds  5. Elevated BNP Significant DOE Minimal if any rest SOB She chronically lays inclined some, she does not have the sense that she is retaining fluid (fairly adamant about that) Exam and CXR do not suggest overt volume OL Follow clinically Diurese if needed   Risk Assessment/Risk Scores:     For questions or updates, please contact [facility] Please consult [email or web address] for contact info under    Signed, Sheilah Pigeon, PA-C  11/26/2023 3:18 PM  I have seen and examined this patient with Francis Dowse.  Agree with above, note added to  reflect my findings.  Patient with a past history as above.  She is post Medtronic dual-chamber pacemaker.  She had an uncomplicated procedure, but has since developed lack of pacemaker capture with her RV lead.  She has been feeling significantly short of breath, weak, fatigued for weeks.    GEN: Well nourished, well developed, in no acute distress  HEENT: normal  Neck: no JVD, carotid bruits, or masses Cardiac: RRR; no murmurs, rubs, or gallops,no edema  Respiratory:  clear to auscultation bilaterally, normal work of breathing GI: soft, nontender, nondistended, + BS MS: no deformity or atrophy  Skin: warm and dry, device site well healed Neuro:  Strength and sensation are intact Psych: euthymic mood, full affect   Complete heart block: Post Medtronic pacemaker.  Unfortunately she has RV lead noncapture.  Adjustments were made by switching the lead to unipolar with elevated outputs.  She is capturing currently.  She Skylur Fuston need lead revision.  Risks and benefits have been discussed.  She understands the risks and is agreed to the procedure. Pacemaker RV lead dislodgment: Plan for lead revision as above Hypertension: Continue home medications Elevated BNP: Significantly short of breath.  Was likely due to bradycardia.  Caeden Foots plan for diuresis overnight if continued shortness of breath  Tiara Avabella Wailes has presented today for surgery, with the diagnosis of RV lead malfunction.  The various methods of treatment have been discussed with the patient and family. After consideration of risks, benefits and other options for treatment, the patient has consented to  Procedure(s): Pacemaker RV lead revision as a surgical intervention .  Risks include but not limited to bleeding, infection, pneumothorax, perforation, tamponade, vascular damage, renal failure, MI, stroke, death, and lead dislodgement . The patient's history has been reviewed, patient examined, no change in status, stable for surgery.  I have  reviewed the patient's chart and labs.  Questions were answered to the patient's satisfaction.    Loman Brooklyn, MD 11/26/2023 4:39 PM

## 2023-11-26 NOTE — ED Provider Notes (Signed)
[place] [facility] Provider Note   CSN: [id number] Arrival date & time: 11/26/23  1015     History  Chief Complaint  Patient presents with   Shortness of Breath    Briana Craig is a 78 y.o. female.  HPI 78 year old female presents with shortness of breath.  She has been experiencing shortness of breath for about 3 weeks.  Is primarily when she exerts herself but also at rest.  She has an intermittent cough that is relatively chronic and seems to occur whenever she has reflux, which she did this morning.  She had enough coughing this morning that she had posttussive emesis 1 time.  She denies chest pain or fevers.  No leg swelling.  She states that she has been dealing with this type of shortness of breath on and off for several months, starting around September.  She states that she had a pacemaker placed at the end of November that was supposed to help with this and seem to transiently help.  She did also get a course of azithromycin at the end of November/early December and that seemed to help the symptoms back then. No lightheadedness or syncope.  Home Medications Prior to Admission medications   Medication Sig Start Date End Date Taking? Authorizing Provider  Accu-Chek Softclix Lancets lancets Use to check blood sugar once a day.  DX  E11.9 12/20/19   Donato Schultz, DO  Alcohol Swabs (ALCOHOL WIPES) 70 % PADS To use to check sugars 03/13/20   Zola Button, Grayling Congress, DO  Blood Glucose Calibration (ACCU-CHEK AVIVA) SOLN Use to check control when opening a new bottle of strips.  DX E11.9 12/20/19   Donato Schultz, DO  Blood Glucose Monitoring Suppl (ACCU-CHEK AVIVA PLUS) w/Device KIT Use to check blood sugar once a day.  DX E11.9 06/10/22   Donato Schultz, DO  Blood Glucose Monitoring Suppl (TRUE METRIX AIR GLUCOSE METER) DEVI Use as directed to check glucose twice daily. E11.9 03/08/20   Donato Schultz, DO  cetirizine  (ZYRTEC) 10 MG tablet Take 10 mg by mouth daily as needed for allergies.    [provider]  Coenzyme Q10 (CO Q 10 PO) Take 1 Cartridge by mouth daily.    [provider]  famotidine (PEPCID) 40 MG tablet Take 1 tablet (40 mg total) by mouth at bedtime. 08/09/23   Donato Schultz, DO  Glucos-Chondroit-Hyaluron-MSM (GLUCOSAMINE CHONDROITIN JOINT PO) Take 2,000 mg by mouth daily.    [provider]  glucose blood (ACCU-CHEK AVIVA PLUS) test strip Use to check blood sugar once daily.  DX  E 11.9 11/30/22   Lowne Chase, Yvonne R, DO  latanoprost (XALATAN) 0.005 % ophthalmic solution Place 1 drop into both eyes at bedtime. 10/29/17   [provider]  Multiple Vitamins-Minerals (MULTIVITAMIN PO) Take 1 tablet by mouth daily. Centrum silver 50+    [provider]  Multiple Vitamins-Minerals (PRESERVISION AREDS) CAPS Take 1 capsule by mouth 2 (two) times daily.    [provider]  Omega-3 Fatty Acids (FISH OIL PO) Take 1,200 mg by mouth daily. Krill Oil    [provider]  pantoprazole (PROTONIX) 40 MG tablet Take 1 tablet (40 mg total) by mouth daily. 11/24/23   Donato Schultz, DO  simvastatin (ZOCOR) 20 MG tablet Take 1 tablet (20 mg total) by mouth daily at 6 PM. 08/09/23   Zola Button, Grayling Congress,  DO  TRUEplus Lancets 33G MISC Use as directed. Check glucose up to twice daily. E11.9 03/08/20   Donato Schultz, DO      Allergies    Taxol [paclitaxel], Tramadol, Carboplatin, Codeine, Compazine  [prochlorperazine maleate], Penicillins, and Prochlorperazine edisylate    Review of Systems   Review of Systems  Constitutional:  Negative for fever.  Respiratory:  Positive for cough and shortness of breath.   Cardiovascular:  Negative for chest pain and leg swelling.  Gastrointestinal:  Positive for vomiting.  Neurological:  Negative for syncope and light-headedness.    Physical Exam Updated Vital Signs BP (!) 147/69   Pulse (!)  41   Temp 97.6 F (36.4 C) (Oral)   Resp 19   Ht 5\' 2"  (1.575 m)   Wt 50.8 kg   SpO2 96%   BMI 20.49 kg/m  Physical Exam Vitals and nursing note reviewed.  Constitutional:      General: She is not in acute distress.    Appearance: She is well-developed. She is not ill-appearing or diaphoretic.  HENT:     Head: Normocephalic and atraumatic.  Cardiovascular:     Rate and Rhythm: Regular rhythm. Bradycardia present.     Heart sounds: Normal heart sounds.  Pulmonary:     Effort: Pulmonary effort is normal.     Breath sounds: Normal breath sounds. No wheezing, rhonchi or rales.  Abdominal:     Palpations: Abdomen is soft.     Tenderness: There is no abdominal tenderness.  Musculoskeletal:     Right lower leg: No edema.     Left lower leg: No edema.  Skin:    General: Skin is warm and dry.  Neurological:     Mental Status: She is alert.     ED Results / Procedures / Treatments   Labs (all labs ordered are listed, but only abnormal results are displayed) Labs Reviewed  COMPREHENSIVE METABOLIC PANEL - Abnormal; Notable for the following components:      Result Value   Glucose, Bld 161 (*)    Creatinine, Ser 1.08 (*)    AST 70 (*)    ALT 60 (*)    GFR, Estimated 52 (*)    All other components within normal limits  CBC WITH DIFFERENTIAL/PLATELET - Abnormal; Notable for the following components:   RBC 3.86 (*)    Hemoglobin 11.7 (*)    HCT 34.7 (*)    All other components within normal limits  BRAIN NATRIURETIC PEPTIDE - Abnormal; Notable for the following components:   B Natriuretic Peptide 1,583.0 (*)    All other components within normal limits  TROPONIN I (HIGH SENSITIVITY) - Abnormal; Notable for the following components:   Troponin I (High Sensitivity) 18 (*)    All other components within normal limits  TROPONIN I (HIGH SENSITIVITY) - Abnormal; Notable for the following components:   Troponin I (High Sensitivity) 21 (*)    All other components within normal  limits  RESP PANEL BY RT-PCR (RSV, FLU A&B, COVID)  [id number]    EKG EKG Interpretation Date/Time:  Friday November 26 2023 10:39:47 EST Ventricular Rate:  45 PR Interval:    QRS Duration:  91 QT Interval:  593 QTC Calculation: 514 R Axis:   -52  Text Interpretation: AV block, complete (third degree) LAD, consider left anterior fascicular block Probable anterior infarct, age indeterminate Prolonged QT interval Confirmed by Pricilla Loveless [phone]) on 11/26/2023 10:49:27 AM  Radiology DG Chest Portable 1 View Result Date:  11/26/2023 CLINICAL DATA:  dyspnea EXAM: PORTABLE CHEST 1 VIEW COMPARISON:  CXR 09/24/23 FINDINGS: Left-sided dual lead cardiac device with unchanged lead positioning. No pleural effusion. No pneumothorax. Unchanged cardiac and mediastinal contours. Surgical clips in the right axilla. No focal airspace opacity. There are prominent bilateral interstitial opacities that appear unchanged from prior exam. No radiographically apparent displaced rib fractures. Visualized upper abdomen unremarkable. IMPRESSION: Unchanged prominent bilateral interstitial opacities. No new focal airspace opacity. Electronically Signed   By: Lorenza Cambridge M.D.   On: 11/26/2023 11:48    Procedures .Critical Care  Performed by: Pricilla Loveless, MD Authorized by: Pricilla Loveless, MD   Critical care provider statement:    Critical care time (minutes):  30   Critical care time was exclusive of:  Separately billable procedures and treating other patients   Critical care was necessary to treat or prevent imminent or life-threatening deterioration of the following conditions:  Cardiac failure and circulatory failure   Critical care was time spent personally by me on the following activities:  Development of treatment plan with patient or surrogate, discussions with consultants, evaluation of patient's response to treatment, examination of patient, ordering and review of laboratory studies, ordering and review  of radiographic studies, ordering and performing treatments and interventions, pulse oximetry, re-evaluation of patient's condition and review of old charts     Medications Ordered in ED Medications - No data to display  ED Course/ Medical Decision Making/ A&P                                 Medical Decision Making Amount and/or Complexity of Data Reviewed Labs: ordered.    Details: Slightly elevated troponins but flat.  Not consistent with ACS.  Significantly elevated BNP of 1500 Radiology: ordered and independent interpretation performed.    Details: Pacemaker wires appear to be in place ECG/medicine tests: ordered and independent interpretation performed.    Details: Third-degree heart block  Risk Decision regarding hospitalization.   Patient's EKG and cardiac monitoring shows that she has complete heart block.  It appears that her pacemaker is not working or at least not sensing that she needs pacing.  She is having shortness of breath which appears to be her symptoms that led to her getting the pacemaker.  She is not having syncope or lightheadedness.  BNP had been sent and is 1500 but she does not have any other signs such as JVD, orthopnea, peripheral edema.  I did discuss this with Dr. Flora Lipps and Dr. Mayford Knife from cardiology, for now it is recommended to hold off on diuresis.  Would like patient admitted to the EP service.  Had recommended ED to ED transfer while waiting on the bed but then the patient's bed became ready so she went straight there via [facility].  She has had bradycardia but no hypotension or shock.  She has remained stable for transfer.        Final Clinical Impression(s) / ED Diagnoses Final diagnoses:  Pacemaker failure, initial encounter    Rx / DC Orders ED Discharge Orders     None         Pricilla Loveless, MD 11/26/23 1426

## 2023-11-27 ENCOUNTER — Inpatient Hospital Stay (HOSPITAL_COMMUNITY): Payer: Medicare PPO

## 2023-11-27 DIAGNOSIS — J9811 Atelectasis: Secondary | ICD-10-CM | POA: Diagnosis not present

## 2023-11-27 DIAGNOSIS — Z4682 Encounter for fitting and adjustment of non-vascular catheter: Secondary | ICD-10-CM | POA: Diagnosis not present

## 2023-11-27 DIAGNOSIS — J90 Pleural effusion, not elsewhere classified: Secondary | ICD-10-CM | POA: Diagnosis not present

## 2023-11-27 DIAGNOSIS — Z950 Presence of cardiac pacemaker: Secondary | ICD-10-CM | POA: Diagnosis not present

## 2023-11-27 DIAGNOSIS — J939 Pneumothorax, unspecified: Secondary | ICD-10-CM | POA: Diagnosis not present

## 2023-11-27 DIAGNOSIS — R918 Other nonspecific abnormal finding of lung field: Secondary | ICD-10-CM | POA: Diagnosis not present

## 2023-11-27 DIAGNOSIS — I771 Stricture of artery: Secondary | ICD-10-CM | POA: Diagnosis not present

## 2023-11-27 LAB — TROPONIN I (HIGH SENSITIVITY): Troponin I (High Sensitivity): 28 ng/L — ABNORMAL HIGH (ref <18)

## 2023-11-27 MED ORDER — NITROGLYCERIN 0.4 MG SL SUBL
0.4000 mg | SUBLINGUAL_TABLET | SUBLINGUAL | Status: DC | PRN
  Administered 2023-11-27: 0.4 mg via SUBLINGUAL

## 2023-11-27 MED ORDER — OXYCODONE-ACETAMINOPHEN 5-325 MG PO TABS
1.0000 | ORAL_TABLET | ORAL | Status: DC | PRN
Start: 2023-11-27 — End: 2023-12-02
  Administered 2023-11-27: 2 via ORAL
  Administered 2023-11-28 – 2023-11-29 (×2): 1 via ORAL
  Administered 2023-11-30 – 2023-12-01 (×2): 2 via ORAL
  Administered 2023-12-01 (×2): 1 via ORAL
  Filled 2023-11-27: qty 1
  Filled 2023-11-27 (×5): qty 2
  Filled 2023-11-27 (×2): qty 1
  Filled 2023-11-27: qty 2
  Filled 2023-11-27: qty 1

## 2023-11-27 MED ORDER — FENTANYL CITRATE PF 50 MCG/ML IJ SOSY
50.0000 ug | PREFILLED_SYRINGE | Freq: Once | INTRAMUSCULAR | Status: AC
  Administered 2023-11-27: 50 ug via INTRAVENOUS
  Filled 2023-11-27: qty 1

## 2023-11-27 MED ORDER — FENTANYL CITRATE PF 50 MCG/ML IJ SOSY
PREFILLED_SYRINGE | INTRAMUSCULAR | Status: AC
  Administered 2023-11-27: 50 ug
  Filled 2023-11-27: qty 1

## 2023-11-27 MED ORDER — SODIUM CHLORIDE 0.9% FLUSH
10.0000 mL | Freq: Three times a day (TID) | INTRAVENOUS | Status: DC
  Administered 2023-11-27 – 2023-11-30 (×11): 10 mL via INTRAPLEURAL

## 2023-11-27 MED ORDER — FENTANYL CITRATE PF 50 MCG/ML IJ SOSY
25.0000 ug | PREFILLED_SYRINGE | Freq: Once | INTRAMUSCULAR | Status: DC
  Filled 2023-11-27: qty 1

## 2023-11-27 MED ORDER — NITROGLYCERIN 0.4 MG SL SUBL
SUBLINGUAL_TABLET | SUBLINGUAL | Status: AC
  Administered 2023-11-27: 0.4 mg via SUBLINGUAL
  Filled 2023-11-27: qty 1

## 2023-11-27 NOTE — Progress Notes (Signed)
Notified of pneumothorax on L CXR this morning. Patient asymptomatic on their exam this morning, per chart review on RA.  Planning for bedside chest tube with RR (not able to make appt in Endo today). Not on AC.  Steffanie Dunn, DO 11/27/23 11:08 AM [facility]  For contact information, see Amion. If no response to pager, please call PCCM consult pager. After hours, 7PM- 7AM, please call Elink.

## 2023-11-27 NOTE — Procedures (Signed)
Insertion of Chest Tube Procedure Note  Briana Craig  [id number]  Nov 18, 1945  Date:11/27/23  Time:12:32 PM    Provider Performing: Comer Locket. [name]   Procedure: Pleural Catheter Insertion w/ Imaging Guidance (13086)  Indication(s) Pneumothorax  Consent Risks of the procedure as well as the alternatives and risks of each were explained to the patient and/or caregiver.  Consent for the procedure was obtained and is signed in the bedside chart  Anesthesia Topical only with 1% lidocaine    Time Out Verified patient identification, verified procedure, site/side was marked, verified correct patient position, special equipment/implants available, medications/allergies/relevant history reviewed, required imaging and test results available.   Sterile Technique Maximal sterile technique including full sterile barrier drape, hand hygiene, sterile gown, sterile gloves, mask, hair covering, sterile ultrasound probe cover (if used).   Procedure Description Ultrasound used to identify appropriate pleural anatomy for placement and overlying skin marked. Area of placement cleaned and draped in sterile fashion.  A 14 French pigtail pleural catheter was placed into the left pleural space using Seldinger technique. Appropriate return of air was obtained.  The tube was connected to atrium and placed on -20 cm H2O wall suction.   Complications/Tolerance None; patient tolerated the procedure well. Chest X-ray is ordered to verify placement.   EBL Minimal  Specimen(s) none  Briana Craig V. Vassie Loll MD

## 2023-11-27 NOTE — Progress Notes (Signed)
Electrophysiology Progress Note  Patient Name: Briana Craig Date of Encounter: 11/27/2023  Primary Cardiologist: None  Electrophysiologist: Dr. York Pellant  Subjective   Feels fine today. No significant discomfort at pacemaker site.  Inpatient Medications    Scheduled Meds:  famotidine  20 mg Oral QHS   latanoprost  1 drop Both Eyes QHS   pantoprazole  40 mg Oral Daily   simvastatin  20 mg Oral q1800   sodium chloride flush  3 mL Intravenous Q12H   Continuous Infusions:  PRN Meds: acetaminophen, nitroGLYCERIN, ondansetron (ZOFRAN) IV   Vital Signs    Vitals:   11/27/23 0253 11/27/23 0300 11/27/23 0306 11/27/23 0757  BP: (!) 158/86 (!) 140/89 126/81   Pulse: 69 76 80   Resp: 20 (!) 21 20   Temp:   98.4 F (36.9 C) 97.7 F (36.5 C)  TempSrc:   Oral Oral  SpO2:   98% 95%  Weight:      Height:        Intake/Output Summary (Last 24 hours) at 11/27/2023 1031 Last data filed at 11/27/2023 0400 Gross per 24 hour  Intake 141.25 ml  Output --  Net 141.25 ml   Filed Weights   11/26/23 1042  Weight: 50.8 kg    Telemetry    A-sensed, V-paced rhythm - Personally Reviewed  ECG    A-sensed, V-paced - Personally Reviewed  Physical Exam   GEN: No acute distress.   Neck: No JVD Cardiac: RRR, no murmurs, rubs, or gallops.  Bandage at device site with slight soil Respiratory: Clear to auscultation bilaterally. Decreased breath sounds on left. GI: Soft, nontender, non-distended  MS: No edema; No deformity. Neuro:  Nonfocal  Psych: Normal affect   Labs    Chemistry Recent Labs  Lab 11/26/23 1058  NA 140  K 4.7  CL 107  CO2 23  GLUCOSE 161*  BUN 21  CREATININE 1.08*  CALCIUM 9.6  PROT 7.1  ALBUMIN 3.9  AST 70*  ALT 60*  ALKPHOS 65  BILITOT 0.8  GFRNONAA 52*  ANIONGAP 10     Hematology Recent Labs  Lab 11/26/23 1058  WBC 6.2  RBC 3.86*  HGB 11.7*  HCT 34.7*  MCV 89.9  MCH 30.3  MCHC 33.7  RDW 14.8  PLT 183    Cardiac  EnzymesNo results for input(s): "TROPONINI" in the last 168 hours. No results for input(s): "TROPIPOC" in the last 168 hours.   BNP Recent Labs  Lab 11/26/23 1115  BNP 1,583.0*     DDimer No results for input(s): "DDIMER" in the last 168 hours.   Summary of Pertinent studies    TTE: 08/2023 -- EF 60-65%. Normal LV function.     Patient Profile     78 y.o. female with a history of HTN, HLD, esophageal Ca (tx w/chemo and XRT), symptomatic bradycardia with conduction system disease including CHB > s/p PPM 09/24/23.  She is admitted with RV lead dysfunction with failure to capture and progression of her bradycardia symptoms.   Assessment & Plan    RV lead dysfunction Pt admitted with failure to capture RV and complete heart block with narrow escape at 45 bpm Suspect microdislodgement of the RV lead Based upon the lead tie down location, I suspect the axillary vein was accessed in a lateral location, over the second rib; no impedence change or noise to suggest lead fracture There was a very good, narrow paced complex post-implant (bipolar pacing) Current unipolar paced complex shows  RV capture rather than conduction system capture. Device is currently capturing programmed with high output in unipolar mode  Occluded right subclavian See venogram from RV lead revision attempt 1/24 Axillary vein fills largely by the cephalic vein Will plan for RV lead extraction and replacement of the RV lead.   Pneumothorax Will consult pulmonology for assistance. Will increase FiO2 for now   For questions or updates, please contact [facility] Please consult [email or web address] for contact info under Cardiology/STEMI.      Signed, Maurice Small, MD 11/27/2023, 10:31 AM

## 2023-11-27 NOTE — Progress Notes (Signed)
Patient has complained of pain 9/10 Notitfied Mealor MD and he gaves ordered. The patient refused the fentanyl states it does not help my pain. Notified MD of this. Medication wasted with Lynnea Maizes RN. SEE MAR for medications given.

## 2023-11-27 NOTE — Plan of Care (Signed)
Patient has received chest tube today. Pain management is priority and she is tolerating well. Originally had an air leak but has continually resolves itself. Will continue to monitor patient

## 2023-11-27 NOTE — Progress Notes (Signed)
CXR showed left pneumothorax. Notified Camnitz MD. Awaiting response

## 2023-11-27 NOTE — Consult Note (Signed)
NAME:  Briana Craig, MRN:  [id number], DOB:  1946/01/06, LOS: 1 ADMISSION DATE:  11/26/2023, CONSULTATION DATE:  11/27/23 REFERRING MD:  Dr. Nelly Laurence, CHIEF COMPLAINT:  pneumothorax   History of Present Illness:   54 yoF with PMH as below significant for complete heart block s/p PPM 09/24/23, HTN, HLD, GERD, former smoker with hx of esophgeal cancer w/ adjuvant therapies and surgical resection 20212 and prior DCIS of right breast admitted 11/26/23 for malfunctioning pacemaker due to suspected micro-dislodgement of RV lead with symptomatic bradycardia/ CHB w/HR in 40's and 3 week hx of intermittent SOB admitted by EP.   Adjustments made to high output with unipolar mode with better capturing.  Taken 1/24 for lead revision attempted but EP unable to pass wire.  Left upper extremity venogram therefore performed showing occlusion of axillary vein but still unable to pass wire.  Plans for RV lead extraction and replacement of RV lead TBD.  On follow up CXR, pt found to have moderate left sided pneumothorax with small associated left pleural effusion with left basilar collapse.  Pt asymptomatic other than slight pressure in lower neck/ upper chest area and remains hemodynamically stable, PCCM consulted for further management.   Of note, pt also followed by Dr. Delton Coombes in our clinic for RUL cavitary nodule seen on CT in 08/2023, suspected to be related to chronic aspiration/ reflux with inflammatory changes but can not rule out malignancy given hx.  Plans for repeat chest CT in April.    Pertinent  Medical History  Complete heart block s/p PPM, HTN, HLD, esophageal cancer s/p chemo/ XRT Past Medical History:  Diagnosis Date   Breast cancer (HCC) 12/2018   right DCIS   Dysphagia    Esophageal cancer (HCC)    GERD (gastroesophageal reflux disease)    Hyperlipemia    Hypertension    no meds now   Impaired fasting blood sugar    Personal history of radiation therapy    PONV (postoperative nausea and  vomiting)    Pre-diabetes    Significant Hospital Events: Including procedures, antibiotic start and stop dates in addition to other pertinent events   1/24 admitted, attempted PM lead revision 1/25 asymptomatic left PTX  Interim History / Subjective:   Objective   Blood pressure 126/81, pulse 80, temperature 97.7 F (36.5 C), temperature source Oral, resp. rate 20, height 5\' 2"  (1.575 m), weight 50.8 kg, SpO2 95%.        Intake/Output Summary (Last 24 hours) at 11/27/2023 1130 Last data filed at 11/27/2023 0400 Gross per 24 hour  Intake 141.25 ml  Output --  Net 141.25 ml   Filed Weights   11/26/23 1042  Weight: 50.8 kg   Examination: General:  Older adult female sitting in bed in NAD Neuro:  alert, oriented, appropriate, MAE CV: paced, dressing to left chest cdi PULM:  non labored, diminished left breath sounds Extremities: warm/dry, no LE edema   Resolved Hospital Problem list    Assessment & Plan:   Left pneumothorax after attempted PPM lead revision P:  - pt asymptomatic and hemodynamically stable - s/p placement of left side pigtail chest tube by Dr. Vassie Loll - place to 20 cm sxn, flush per protocol - CXR post procedure pending - CXR pending in am, if resolved, likely transition to water seal 1/26 am - PCCM will continue to follow for management.    CHB s/p PPM 09/2023 with RV lead dysfunction with symptomatic bradycardia Left axillary vein occlusion  P:  Per EP, plans for RV lead extraction and replacement of RV lead TBD.  Programming since changed to unipolar pacing with good capture thus far   Best Practice (right click and "Reselect all SmartList Selections" daily)  Per primary  Labs   CBC: Recent Labs  Lab 11/26/23 1058  WBC 6.2  NEUTROABS 4.5  HGB 11.7*  HCT 34.7*  MCV 89.9  PLT 183    Basic Metabolic Panel: Recent Labs  Lab 11/26/23 1058  NA 140  K 4.7  CL 107  CO2 23  GLUCOSE 161*  BUN 21  CREATININE 1.08*  CALCIUM 9.6    GFR: Estimated Creatinine Clearance: 33.4 mL/min (A) (by C-G formula based on SCr of 1.08 mg/dL (H)). Recent Labs  Lab 11/26/23 1058  WBC 6.2    Liver Function Tests: Recent Labs  Lab 11/26/23 1058  AST 70*  ALT 60*  ALKPHOS 65  BILITOT 0.8  PROT 7.1  ALBUMIN 3.9   No results for input(s): "LIPASE", "AMYLASE" in the last 168 hours. No results for input(s): "AMMONIA" in the last 168 hours.  ABG    Component Value Date/Time   TCO2 25 02/19/2011 0959     Coagulation Profile: No results for input(s): "INR", "PROTIME" in the last 168 hours.  Cardiac Enzymes: No results for input(s): "CKTOTAL", "CKMB", "CKMBINDEX", "TROPONINI" in the last 168 hours.  HbA1C: Hgb A1c MFr Bld  Date/Time Value Ref Range Status  07/16/2023 12:26 PM 7.1 (H) 4.6 - 6.5 % Final    Comment:    Glycemic Control Guidelines for People with Diabetes:Non Diabetic:  <6%Goal of Therapy: <7%Additional Action Suggested:  >8%   05/27/2023 08:05 AM 6.9 (H) 4.6 - 6.5 % Final    Comment:    Glycemic Control Guidelines for People with Diabetes:Non Diabetic:  <6%Goal of Therapy: <7%Additional Action Suggested:  >8%     CBG: No results for input(s): "GLUCAP" in the last 168 hours.  Review of Systems:   Negative except for slight pressure in lower neck/ midline upper chest  Past Medical History:  She,  has a past medical history of Breast cancer (HCC) (12/2018), Dysphagia, Esophageal cancer (HCC), GERD (gastroesophageal reflux disease), Hyperlipemia, Hypertension, Impaired fasting blood sugar, Personal history of radiation therapy, PONV (postoperative nausea and vomiting), and Pre-diabetes.   Surgical History:   Past Surgical History:  Procedure Laterality Date   BREAST BIOPSY Right 12/23/2018   BREAST LUMPECTOMY Right 01/18/2019   BREAST LUMPECTOMY WITH RADIOACTIVE SEED LOCALIZATION Right 01/18/2019   Procedure: RIGHT BREAST CENTRAL LUMPECTOMY WITH RADIOACTIVE SEED LOCALIZATION;  Surgeon: Emelia Loron, MD;  Location: [facility];  Service: General;  Laterality: Right;   DIRECT LARYNGOSCOPY WITH BOTOX INJECTION     tx paralysed vocal cord   ESOPHAGOGASTRODUODENOSCOPY  11/17/2011   Procedure: ESOPHAGOGASTRODUODENOSCOPY (EGD);  Surgeon: Rob Bunting, MD;  Location: [facility];  Service: Endoscopy;  Laterality: N/A;   ESOPHAGUS SURGERY     EYE SURGERY     both cataracts   GANGLION CYST EXCISION Left 01/26/2013   Procedure: LEFT FLEXOR CARPI RADIALIS  RELEASE AND SCAPHO TRAPEZIAL TRAPEZOID DEBRIDEMENT ;  Surgeon: Tami Ribas, MD;  Location: [facility];  Service: Orthopedics;  Laterality: Left;   PACEMAKER IMPLANT N/A 09/24/2023   Procedure: PACEMAKER IMPLANT;  Surgeon: Maurice Small, MD;  Location: [facility];  Service: Cardiovascular;  Laterality: N/A;     Social History:   reports that she quit smoking about 36 years ago. Her smoking  use included cigarettes. She has never used smokeless tobacco. She reports that she does not drink alcohol and does not use drugs.   Family History:  Her family history includes AAA (abdominal aortic aneurysm) in her mother; Alcohol abuse in her sister; COPD in her father. There is no history of Colon cancer.   Allergies Allergies  Allergen Reactions   Taxol [Paclitaxel] Hives and Itching   Tramadol Other (See Comments)    "Sweaty, shaky and faint"   Carboplatin Hives and Itching   Codeine Other (See Comments)    Could not sleep, Shaky and Sweaty   Compazine  [Prochlorperazine Maleate] Other (See Comments)    Severe chest pain   Penicillins Other (See Comments)    Childhood   Prochlorperazine Edisylate Other (See Comments)    Severe chest pain     Home Medications  Prior to Admission medications   Medication Sig Start Date End Date Taking? Authorizing Provider  cetirizine (ZYRTEC) 10 MG tablet Take 10 mg by mouth daily as needed for allergies.   Yes [provider]  Coenzyme Q10 (CO Q  10 PO) Take 1 capsule by mouth at bedtime. Gel capsule   Yes [provider]  famotidine (PEPCID) 40 MG tablet Take 1 tablet (40 mg total) by mouth at bedtime. 08/09/23  Yes Seabron Spates R, DO  Glucos-Chondroit-Hyaluron-MSM (GLUCOSAMINE CHONDROITIN JOINT PO) Take 2,000 mg by mouth daily.   Yes [provider]  latanoprost (XALATAN) 0.005 % ophthalmic solution Place 1 drop into both eyes at bedtime. 10/29/17  Yes [provider]  Multiple Vitamins-Minerals (MULTIVITAMIN PO) Take 1 tablet by mouth daily. Centrum silver 50+   Yes [provider]  Multiple Vitamins-Minerals (PRESERVISION AREDS) CAPS Take 1 capsule by mouth 2 (two) times daily.   Yes [provider]  Omega-3 Fatty Acids (FISH OIL PO) Take 1,200 mg by mouth daily. Krill Oil   Yes [provider]  pantoprazole (PROTONIX) 40 MG tablet Take 1 tablet (40 mg total) by mouth daily. 11/24/23  Yes Seabron Spates R, DO  simvastatin (ZOCOR) 20 MG tablet Take 1 tablet (20 mg total) by mouth daily at 6 PM. 08/09/23  Yes Donato Schultz, DO  Accu-Chek Softclix Lancets lancets Use to check blood sugar once a day.  DX  E11.9 12/20/19   Donato Schultz, DO  Alcohol Swabs (ALCOHOL WIPES) 70 % PADS To use to check sugars 03/13/20   Zola Button, Grayling Congress, DO  Blood Glucose Calibration (ACCU-CHEK AVIVA) SOLN Use to check control when opening a new bottle of strips.  DX E11.9 12/20/19   Donato Schultz, DO  Blood Glucose Monitoring Suppl (ACCU-CHEK AVIVA PLUS) w/Device KIT Use to check blood sugar once a day.  DX E11.9 06/10/22   Donato Schultz, DO  Blood Glucose Monitoring Suppl (TRUE METRIX AIR GLUCOSE METER) DEVI Use as directed to check glucose twice daily. E11.9 03/08/20   Seabron Spates R, DO  glucose blood (ACCU-CHEK AVIVA PLUS) test strip Use to check blood sugar once daily.  DX  E 11.9 11/30/22   Lowne Chase, Yvonne R, DO  TRUEplus Lancets 33G MISC Use as directed.  Check glucose up to twice daily. E11.9 03/08/20   Donato Schultz, DO     Critical care time: n/a     Posey Boyer, MSN, AG-ACNP-BC [facility] 11/27/2023, 11:30 AM  See Amion for pager If no response to pager ,  please call [phone] until 7pm After 7:00 pm call Elink  [phone]

## 2023-11-27 NOTE — Progress Notes (Signed)
Chest tube insertion done by Dr Vassie Loll. Patient tolerated procedure well. Report given to primary RN.Patients call bell within reach.

## 2023-11-27 NOTE — Progress Notes (Addendum)
Patient is having complaints of a headache 6/10. Chest tightness. Patient states when she breathes in she is having some difficulty catching her breath and there is also pain present in her mid upper chest chest. Dennie Bible also states her teeth are hurting. EKG in process. Naricisse MD paged

## 2023-11-28 ENCOUNTER — Inpatient Hospital Stay (HOSPITAL_COMMUNITY): Payer: Medicare PPO

## 2023-11-28 DIAGNOSIS — J939 Pneumothorax, unspecified: Secondary | ICD-10-CM | POA: Diagnosis not present

## 2023-11-28 DIAGNOSIS — I700 Atherosclerosis of aorta: Secondary | ICD-10-CM | POA: Diagnosis not present

## 2023-11-28 DIAGNOSIS — J984 Other disorders of lung: Secondary | ICD-10-CM | POA: Diagnosis not present

## 2023-11-28 LAB — CBC
HCT: 38.3 % (ref 36.0–46.0)
Hemoglobin: 12.8 g/dL (ref 12.0–15.0)
MCH: 30.1 pg (ref 26.0–34.0)
MCHC: 33.4 g/dL (ref 30.0–36.0)
MCV: 90.1 fL (ref 80.0–100.0)
Platelets: 202 10*3/uL (ref 150–400)
RBC: 4.25 MIL/uL (ref 3.87–5.11)
RDW: 14.4 % (ref 11.5–15.5)
WBC: 9 10*3/uL (ref 4.0–10.5)
nRBC: 0 % (ref 0.0–0.2)

## 2023-11-28 LAB — BASIC METABOLIC PANEL
Anion gap: 13 (ref 5–15)
BUN: 7 mg/dL — ABNORMAL LOW (ref 8–23)
CO2: 23 mmol/L (ref 22–32)
Calcium: 9.5 mg/dL (ref 8.9–10.3)
Chloride: 100 mmol/L (ref 98–111)
Creatinine, Ser: 0.84 mg/dL (ref 0.44–1.00)
GFR, Estimated: >60 mL/min (ref >60)
Glucose, Bld: 138 mg/dL — ABNORMAL HIGH (ref 70–99)
Potassium: 4.1 mmol/L (ref 3.5–5.1)
Sodium: 136 mmol/L (ref 135–145)

## 2023-11-28 LAB — GLUCOSE, CAPILLARY: Glucose-Capillary: 115 mg/dL — ABNORMAL HIGH (ref 70–99)

## 2023-11-28 LAB — MAGNESIUM: Magnesium: 1.9 mg/dL (ref 1.7–2.4)

## 2023-11-28 MED ORDER — NIFEDIPINE ER OSMOTIC RELEASE 30 MG PO TB24
30.0000 mg | ORAL_TABLET | Freq: Every day | ORAL | Status: DC
Start: 2023-11-28 — End: 2023-12-02
  Administered 2023-11-28 – 2023-12-02 (×5): 30 mg via ORAL
  Filled 2023-11-28 (×5): qty 1

## 2023-11-28 MED ORDER — HYDRALAZINE HCL 25 MG PO TABS: 25.0000 mg | ORAL_TABLET | Freq: Four times a day (QID) | ORAL | Status: DC | PRN

## 2023-11-28 MED ORDER — ALPRAZOLAM 0.5 MG PO TABS
0.5000 mg | ORAL_TABLET | Freq: Every evening | ORAL | Status: DC | PRN
  Administered 2023-11-28 – 2023-11-29 (×2): 0.5 mg via ORAL
  Filled 2023-11-28 (×2): qty 1

## 2023-11-28 NOTE — Plan of Care (Signed)
Will continue to monitor patient.

## 2023-11-28 NOTE — Progress Notes (Signed)
NAME:  Briana Craig, MRN:  [id number], DOB:  07-Oct-1946, LOS: 2 ADMISSION DATE:  11/26/2023, CONSULTATION DATE:  11/27/23 REFERRING MD:  Dr. Nelly Laurence, CHIEF COMPLAINT:  pneumothorax   History of Present Illness:   33 yoF with PMH as below significant for complete heart block s/p PPM 09/24/23, HTN, HLD, GERD, former smoker with hx of esophgeal cancer w/ adjuvant therapies and surgical resection 20212 and prior DCIS of right breast admitted 11/26/23 for malfunctioning pacemaker due to suspected micro-dislodgement of RV lead with symptomatic bradycardia/ CHB w/HR in 40's and 3 week hx of intermittent SOB admitted by EP.   Adjustments made to high output with unipolar mode with better capturing.  Taken 1/24 for lead revision attempted but EP unable to pass wire.  Left upper extremity venogram therefore performed showing occlusion of axillary vein but still unable to pass wire.  Plans for RV lead extraction and replacement of RV lead TBD.  On follow up CXR, pt found to have moderate left sided pneumothorax with small associated left pleural effusion with left basilar collapse.  Pt asymptomatic other than slight pressure in lower neck/ upper chest area and remains hemodynamically stable, PCCM consulted for further management.   Of note, pt also followed by Dr. Delton Coombes in our clinic for RUL cavitary nodule seen on CT in 08/2023, suspected to be related to chronic aspiration/ reflux with inflammatory changes but can not rule out malignancy given hx.  Plans for repeat chest CT in April.    Pertinent  Medical History  Complete heart block s/p PPM, HTN, HLD, esophageal cancer s/p chemo/ XRT Past Medical History:  Diagnosis Date   Breast cancer (HCC) 12/2018   right DCIS   Dysphagia    Esophageal cancer (HCC)    GERD (gastroesophageal reflux disease)    Hyperlipemia    Hypertension    no meds now   Impaired fasting blood sugar    Personal history of radiation therapy    PONV (postoperative nausea and  vomiting)    Pre-diabetes    Significant Hospital Events: Including procedures, antibiotic start and stop dates in addition to other pertinent events   1/24 admitted, attempted PM lead revision 1/25 asymptomatic left PTX  Interim History / Subjective:   Chest pain has eased. Complains of nausea this morning. No dyspnea  Objective   Blood pressure (!) 146/88, pulse 81, temperature 97.7 F (36.5 C), temperature source Oral, resp. rate (!) 21, height 5\' 2"  (1.575 m), weight 50.8 kg, SpO2 93%.        Intake/Output Summary (Last 24 hours) at 11/28/2023 1021 Last data filed at 11/28/2023 0631 Gross per 24 hour  Intake 380 ml  Output 310 ml  Net 70 ml   Filed Weights   11/26/23 1042 11/28/23 0352  Weight: 50.8 kg 50.8 kg   Examination: General:  Older adult female sitting in bed in NAD Neuro:  alert, oriented, appropriate, MAE CV: paced, dressing to left chest cdi PULM: No accessory muscle use, decreased breath sounds on left , chest tube has airleak with deep breathing or coughing Extremities: warm/dry, no LE edema   Chest x-ray shows no pneumothorax left basilar airspace disease  Resolved Hospital Problem list    Assessment & Plan:   Left pneumothorax after attempted PPM lead revision Status post pigtail , airleak persists P:  -Continue suction at 20 cm , flush per protocol -Low threshold to add antibiotics if fever for left basilar airspace disease - PCCM will continue to follow for  management.   Cavitary lesion right upper lobe 11 mm History of esophageal cancer with gastric pull-up 2012 -Repeat CT chest planned in April 2025   CHB s/p PPM 09/2023 with RV lead dysfunction with symptomatic bradycardia Left axillary vein occlusion  P:  Per EP, plans for RV lead extraction and replacement of RV lead TBD.  Programming since changed to unipolar pacing with good capture thus far   Best Practice (right click and "Reselect all SmartList Selections" daily)  Per  primary  Labs   CBC: Recent Labs  Lab 11/26/23 1058  WBC 6.2  NEUTROABS 4.5  HGB 11.7*  HCT 34.7*  MCV 89.9  PLT 183    Basic Metabolic Panel: Recent Labs  Lab 11/26/23 1058  NA 140  K 4.7  CL 107  CO2 23  GLUCOSE 161*  BUN 21  CREATININE 1.08*  CALCIUM 9.6   GFR: Estimated Creatinine Clearance: 33.4 mL/min (A) (by C-G formula based on SCr of 1.08 mg/dL (H)). Recent Labs  Lab 11/26/23 1058  WBC 6.2    Liver Function Tests: Recent Labs  Lab 11/26/23 1058  AST 70*  ALT 60*  ALKPHOS 65  BILITOT 0.8  PROT 7.1  ALBUMIN 3.9   No results for input(s): "LIPASE", "AMYLASE" in the last 168 hours. No results for input(s): "AMMONIA" in the last 168 hours.  ABG    Component Value Date/Time   TCO2 25 02/19/2011 0959     Coagulation Profile: No results for input(s): "INR", "PROTIME" in the last 168 hours.  Cardiac Enzymes: No results for input(s): "CKTOTAL", "CKMB", "CKMBINDEX", "TROPONINI" in the last 168 hours.  HbA1C: Hgb A1c MFr Bld  Date/Time Value Ref Range Status  07/16/2023 12:26 PM 7.1 (H) 4.6 - 6.5 % Final    Comment:    Glycemic Control Guidelines for People with Diabetes:Non Diabetic:  <6%Goal of Therapy: <7%Additional Action Suggested:  >8%   05/27/2023 08:05 AM 6.9 (H) 4.6 - 6.5 % Final    Comment:    Glycemic Control Guidelines for People with Diabetes:Non Diabetic:  <6%Goal of Therapy: <7%Additional Action Suggested:  >8%     CBG: Recent Labs  Lab 11/28/23 0532  GLUCAP 115*    Cyril Mourning MD. Tonny Bollman. [facility] Pager : [phone]  If no response to pager , please call [phone] until 7 pm After 7:00 pm call Elink  [phone]    11/28/2023, 10:21 AM

## 2023-11-28 NOTE — Progress Notes (Signed)
Electrophysiology Progress Note  Patient Name: Briana Craig Date of Encounter: 11/28/2023  Primary Cardiologist: None  Electrophysiologist: Dr. York Pellant  Subjective   Feels fine today. No significant discomfort at pacemaker site. Some pain at chest tube site that is well controlled.  Inpatient Medications    Scheduled Meds:  famotidine  20 mg Oral QHS   fentaNYL (SUBLIMAZE) injection  25 mcg Intravenous Once   latanoprost  1 drop Both Eyes QHS   pantoprazole  40 mg Oral Daily   simvastatin  20 mg Oral q1800   sodium chloride flush  10 mL Intrapleural Q8H   sodium chloride flush  3 mL Intravenous Q12H   Continuous Infusions:  PRN Meds: acetaminophen, nitroGLYCERIN, ondansetron (ZOFRAN) IV, oxyCODONE-acetaminophen   Vital Signs    Vitals:   11/27/23 2352 11/28/23 0352 11/28/23 0722 11/28/23 0854  BP: (!) 155/90 (!) 143/93 (!) 143/85 (!) 146/88  Pulse: 70 70 78 81  Resp: 16 19 15  (!) 21  Temp: 98 F (36.7 C) 98 F (36.7 C)  97.7 F (36.5 C)  TempSrc: Oral Oral  Oral  SpO2: 100% 98% 97% 93%  Weight:  50.8 kg    Height:        Intake/Output Summary (Last 24 hours) at 11/28/2023 1223 Last data filed at 11/28/2023 0631 Gross per 24 hour  Intake 380 ml  Output 310 ml  Net 70 ml   Filed Weights   11/26/23 1042 11/28/23 0352  Weight: 50.8 kg 50.8 kg    Telemetry    A-sensed, V-paced rhythm - Personally Reviewed  ECG    A-sensed, V-paced - Personally Reviewed  Physical Exam   GEN: No acute distress.   Neck: No JVD Cardiac: RRR, no murmurs, rubs, or gallops.  Bandage at device site with slight soil Respiratory: Clear to auscultation bilaterally.  GI: Soft, nontender, non-distended  MS: No edema; No deformity. Neuro:  Nonfocal  Psych: Normal affect   Labs    Chemistry Recent Labs  Lab 11/26/23 1058  NA 140  K 4.7  CL 107  CO2 23  GLUCOSE 161*  BUN 21  CREATININE 1.08*  CALCIUM 9.6  PROT 7.1  ALBUMIN 3.9  AST 70*  ALT 60*   ALKPHOS 65  BILITOT 0.8  GFRNONAA 52*  ANIONGAP 10     Hematology Recent Labs  Lab 11/26/23 1058  WBC 6.2  RBC 3.86*  HGB 11.7*  HCT 34.7*  MCV 89.9  MCH 30.3  MCHC 33.7  RDW 14.8  PLT 183    Cardiac EnzymesNo results for input(s): "TROPONINI" in the last 168 hours. No results for input(s): "TROPIPOC" in the last 168 hours.   BNP Recent Labs  Lab 11/26/23 1115  BNP 1,583.0*     DDimer No results for input(s): "DDIMER" in the last 168 hours.   Summary of Pertinent studies    TTE: 08/2023 -- EF 60-65%. Normal LV function.     Patient Profile     78 y.o. female with a history of HTN, HLD, esophageal Ca (tx w/chemo and XRT), symptomatic bradycardia with conduction system disease including CHB > s/p PPM 09/24/23.  She is admitted with RV lead dysfunction with failure to capture and progression of her bradycardia symptoms.   Assessment & Plan    RV lead dysfunction Pt admitted with failure to capture RV and complete heart block with narrow escape at 45 bpm Suspect microdislodgement of the RV lead Based upon the lead tie down location, I suspect  the axillary vein was accessed in a lateral location, over the second rib; no impedence change or noise to suggest lead fracture There was a very good, narrow paced complex post-implant (bipolar pacing) Current unipolar paced complex shows RV capture rather than conduction system capture. Device is currently capturing programmed with high output in unipolar mode  Occluded right subclavian See venogram from RV lead revision attempt 1/24 Axillary vein fills largely by the cephalic vein Will plan for RV lead extraction and replacement of the RV lead.   Pneumothorax Chest tube in place Pulm following -- and very much appreciated  Hypertension BP elevated during admission Monitor for now  For questions or updates, please contact [facility] Please consult [email or web address] for contact info under Cardiology/STEMI.       Signed, Maurice Small, MD 11/28/2023, 12:23 PM

## 2023-11-28 NOTE — Plan of Care (Signed)

## 2023-11-28 NOTE — H&P (View-Only) (Signed)
Patient Name: Briana Craig Date of Encounter: 11/30/2023  Primary Cardiologist: None Electrophysiologist: None  Interval Summary   The patient is doing well today. Anxious to get her procedure completed.  At this time, the patient denies chest pain, shortness of breath, or any new concerns.  Vital Signs    Vitals:   11/30/23 0444 11/30/23 0613 11/30/23 0739 11/30/23 1150  BP: 109/61 (P) 126/75 115/81 106/73  Pulse:   72 79  Resp:   15 18  Temp: 98.4 F (36.9 C) (P) 97.7 F (36.5 C) 98.5 F (36.9 C) 98.2 F (36.8 C)  TempSrc: Oral (P) Oral Oral Oral  SpO2: 95% (P) 94% 95% 96%  Weight:      Height:        Intake/Output Summary (Last 24 hours) at 11/30/2023 1314 Last data filed at 11/30/2023 1300 Gross per 24 hour  Intake 765.35 ml  Output 818 ml  Net -52.65 ml   Filed Weights   11/26/23 1042 11/28/23 0352  Weight: 50.8 kg 50.8 kg    Physical Exam    GEN- pleasant adult female, alert and oriented x 3 today.   Lungs- Clear to ausculation bilaterally, normal work of breathing Cardiac- Regular rate and rhythm, no murmurs, rubs or gallops GI- soft, NT, ND, + BS Extremities- no clubbing or cyanosis. No edema  Telemetry    SR 70's, with unipolar VP mixed with intrinsic SR, and noted periods of narrow escape, one episode of LOC / pause (personally reviewed)  Hospital Course    Briana Craig is a 78 y.o. female with PMH of HTN, HLD, esophageal CA s/p chemo / XRT, symptomatic bradycardia with conduction system disease s/p PPM 09/24/23 admitted for RV lead dysfunction with failure to capture and progression of bradycardic symptoms. Admit with CHB with narrow escape at 45 bpm. Attempted lead revision 11/26/23 but unable to pass wire.  Found to have moderate L PTX post LUE venogram.   Assessment & Plan    RV Lead Dysfunction  Symptomatic Bradycardia / CHB  Suspected lead dislodgement of RV lead. Current unipolar paced complex shows RV capture rather than  conduction system capture.  -currently capturing programmed at [facility] @ 1ms in unipolar -tele monitoring  -remains NPO for procedure  -no need for anesthesia or CVTS support   Occluded R Subclavian  -note venogram from RV lead revision attempt 1/24  -axillary vein fills largely by the cephalic vein  -pending RV lead revision & replacement of RV lead 1/28  Hypertension  -well controlled on current regimen   Iatrogenic PTX  Hx Cavitary Lesion RUL, 11mm  -per PCCM  -persistent air leak, chest tube per PCCM, appreciate assistance  -continue chest tube until post implant > defer timing of removal to PCCM otherwise -flush per protocol   Hx Esophageal CA with Gastric Pull-up 2012 -per primary  -can not lie flat / has to have 15 degrees    For questions or updates, please contact [facility] Please consult [email or web address] for contact info under Cardiology/STEMI.  Signed, Canary Brim, NP-C, AGACNP-BC [facility] - Electrophysiology  11/30/2023, 1:14 PM  EP Attending  Patient seen and examined. Agree with above. Discussed with Dr. Elberta Fortis and Mealor. The patient is s/p attempted lead revision complicated by PTX. Our plan is to attempt central access and insertion of a new RV lead and then removal of the old. If not an option, we will sacrifice the RA lead and put a new RA and RV lead in  and then remove the old poorly functioning RV/left bundle lead. I have reviewed the indications/risks/benefits/goals/expectations and she is willing to proceed.   [name],MD

## 2023-11-28 NOTE — Progress Notes (Signed)
MEWS Progress Note  Patient Details Name: Briana Craig MRN: [id number] DOB: January 28, 1946 Today's Date: 11/28/2023   MEWS Flowsheet Documentation:  Assess: MEWS Score Temp: 97.7 F (36.5 C) BP: (!) 146/88 MAP (mmHg): 105 Pulse Rate: 81 ECG Heart Rate: 81 Resp: (!) 21 Level of Consciousness: Alert SpO2: 93 % O2 Device: Room Air (pt took cannula off "because her nose was plugged (breathing) and it was too much") O2 Flow Rate (L/min): 2 L/min Assess: MEWS Score MEWS Temp: 0 MEWS Systolic: 0 MEWS Pulse: 0 MEWS RR: 1 MEWS LOC: 0 MEWS Score: 1 MEWS Score Color: Green Assess: SIRS CRITERIA SIRS Temperature : 0 SIRS Respirations : 1 SIRS Pulse: 0 SIRS WBC: 0 SIRS Score Sum : 1 SIRS Temperature : 0 SIRS Pulse: 0 SIRS Respirations : 0 SIRS WBC: 0 SIRS Score Sum : 0 Assess: if the MEWS score is Yellow or Red Were vital signs accurate and taken at a resting state?: Yes Does the patient meet 2 or more of the SIRS criteria?: No MEWS guidelines implemented : Yes, yellow Treat MEWS Interventions: Considered administering scheduled or prn medications/treatments as ordered Take Vital Signs Increase Vital Sign Frequency : Yellow: Q2hr x1, continue Q4hrs until patient remains green for 12hrs Escalate MEWS: Escalate: Yellow: Discuss with charge nurse and consider notifying provider and/or RRT        Edy Mcbane I Abeni Finchum 11/28/2023, 9:34 AM

## 2023-11-28 NOTE — Progress Notes (Incomplete)
Patient Name: Briana Craig Date of Encounter: 11/30/2023  Primary Cardiologist: None Electrophysiologist: None  Interval Summary   The patient is doing well today. Anxious to get her procedure completed.  At this time, the patient denies chest pain, shortness of breath, or any new concerns.  Vital Signs    Vitals:   11/30/23 0444 11/30/23 0613 11/30/23 0739 11/30/23 1150  BP: 109/61 (P) 126/75 115/81 106/73  Pulse:   72 79  Resp:   15 18  Temp: 98.4 F (36.9 C) (P) 97.7 F (36.5 C) 98.5 F (36.9 C) 98.2 F (36.8 C)  TempSrc: Oral (P) Oral Oral Oral  SpO2: 95% (P) 94% 95% 96%  Weight:      Height:        Intake/Output Summary (Last 24 hours) at 11/30/2023 1314 Last data filed at 11/30/2023 1300 Gross per 24 hour  Intake 765.35 ml  Output 818 ml  Net -52.65 ml   Filed Weights   11/26/23 1042 11/28/23 0352  Weight: 50.8 kg 50.8 kg    Physical Exam    GEN- pleasant adult female, alert and oriented x 3 today.   Lungs- Clear to ausculation bilaterally, normal work of breathing Cardiac- Regular rate and rhythm, no murmurs, rubs or gallops GI- soft, NT, ND, + BS Extremities- no clubbing or cyanosis. No edema  Telemetry    SR 70's, with unipolar VP mixed with intrinsic SR, and noted periods of narrow escape, one episode of LOC / pause (personally reviewed)  Hospital Course    Briana Craig is a 78 y.o. female with PMH of HTN, HLD, esophageal CA s/p chemo / XRT, symptomatic bradycardia with conduction system disease s/p PPM 09/24/23 admitted for RV lead dysfunction with failure to capture and progression of bradycardic symptoms. Admit with CHB with narrow escape at 45 bpm. Attempted lead revision 11/26/23 but unable to pass wire.  Found to have moderate L PTX post LUE venogram.   Assessment & Plan    RV Lead Dysfunction  Symptomatic Bradycardia / CHB  Suspected lead dislodgement of RV lead. Current unipolar paced complex shows RV capture rather than  conduction system capture.  -currently capturing programmed at [facility] @ 1ms in unipolar -tele monitoring  -remains NPO for procedure  -no need for anesthesia or CVTS support   Occluded R Subclavian  -note venogram from RV lead revision attempt 1/24  -axillary vein fills largely by the cephalic vein  -pending RV lead revision & replacement of RV lead 1/28  Hypertension  -well controlled on current regimen   Iatrogenic PTX  Hx Cavitary Lesion RUL, 11mm  -per PCCM  -persistent air leak, chest tube per PCCM, appreciate assistance  -continue chest tube until post implant > defer timing of removal to PCCM otherwise -flush per protocol   Hx Esophageal CA with Gastric Pull-up 2012 -per primary  -can not lie flat / has to have 15 degrees    For questions or updates, please contact [facility] Please consult [email or web address] for contact info under Cardiology/STEMI.  Signed, Briana Brim, NP-C, AGACNP-BC [facility] - Electrophysiology  11/30/2023, 1:14 PM  EP Attending  Patient seen and examined. Agree with above. Discussed with Dr. Elberta Craig and Briana Craig. The patient is s/p attempted lead revision complicated by PTX. Our plan is to attempt central access and insertion of a new RV lead and then removal of the old. If not an option, we will sacrifice the RA lead and put a new RA and RV lead in  and then remove the old poorly functioning RV/left bundle lead. I have reviewed the indications/risks/benefits/goals/expectations and she is willing to proceed.   Briana Gowda Kemarion Abbey,MD

## 2023-11-29 ENCOUNTER — Encounter (HOSPITAL_COMMUNITY): Payer: Self-pay | Admitting: Cardiology

## 2023-11-29 ENCOUNTER — Inpatient Hospital Stay (HOSPITAL_COMMUNITY): Payer: Medicare PPO

## 2023-11-29 ENCOUNTER — Other Ambulatory Visit: Payer: Medicare PPO

## 2023-11-29 DIAGNOSIS — J939 Pneumothorax, unspecified: Secondary | ICD-10-CM | POA: Diagnosis not present

## 2023-11-29 DIAGNOSIS — Z950 Presence of cardiac pacemaker: Secondary | ICD-10-CM | POA: Diagnosis not present

## 2023-11-29 DIAGNOSIS — J929 Pleural plaque without asbestos: Secondary | ICD-10-CM | POA: Diagnosis not present

## 2023-11-29 DIAGNOSIS — R918 Other nonspecific abnormal finding of lung field: Secondary | ICD-10-CM | POA: Diagnosis not present

## 2023-11-29 LAB — CBC
HCT: 34.1 % — ABNORMAL LOW (ref 36.0–46.0)
Hemoglobin: 11.5 g/dL — ABNORMAL LOW (ref 12.0–15.0)
MCH: 30.6 pg (ref 26.0–34.0)
MCHC: 33.7 g/dL (ref 30.0–36.0)
MCV: 90.7 fL (ref 80.0–100.0)
Platelets: 187 10*3/uL (ref 150–400)
RBC: 3.76 MIL/uL — ABNORMAL LOW (ref 3.87–5.11)
RDW: 14.3 % (ref 11.5–15.5)
WBC: 6.5 10*3/uL (ref 4.0–10.5)
nRBC: 0 % (ref 0.0–0.2)

## 2023-11-29 LAB — BASIC METABOLIC PANEL
Anion gap: 8 (ref 5–15)
BUN: 7 mg/dL — ABNORMAL LOW (ref 8–23)
CO2: 24 mmol/L (ref 22–32)
Calcium: 8.9 mg/dL (ref 8.9–10.3)
Chloride: 102 mmol/L (ref 98–111)
Creatinine, Ser: 0.76 mg/dL (ref 0.44–1.00)
GFR, Estimated: >60 mL/min (ref >60)
Glucose, Bld: 127 mg/dL — ABNORMAL HIGH (ref 70–99)
Potassium: 3.5 mmol/L (ref 3.5–5.1)
Sodium: 134 mmol/L — ABNORMAL LOW (ref 135–145)

## 2023-11-29 LAB — GLUCOSE, CAPILLARY
Glucose-Capillary: 130 mg/dL — ABNORMAL HIGH (ref 70–99)
Glucose-Capillary: 102 mg/dL — ABNORMAL HIGH (ref 70–99)

## 2023-11-29 LAB — MAGNESIUM: Magnesium: 2 mg/dL (ref 1.7–2.4)

## 2023-11-29 MED ORDER — SODIUM CHLORIDE 0.9 % IV SOLN: INTRAVENOUS | Status: DC

## 2023-11-29 MED ORDER — CHLORHEXIDINE GLUCONATE 4 % EX SOLN
60.0000 mL | Freq: Once | CUTANEOUS | Status: AC
Start: 2023-11-30 — End: 2023-11-30
  Administered 2023-11-30: 4 via TOPICAL
  Filled 2023-11-29: qty 60

## 2023-11-29 MED ORDER — CHLORHEXIDINE GLUCONATE 4 % EX SOLN
60.0000 mL | Freq: Once | CUTANEOUS | Status: AC
  Administered 2023-11-29: 4 via TOPICAL
  Filled 2023-11-29: qty 60

## 2023-11-29 MED ORDER — SODIUM CHLORIDE 0.9% FLUSH
3.0000 mL | Freq: Two times a day (BID) | INTRAVENOUS | Status: DC
  Administered 2023-11-29 – 2023-12-01 (×4): 3 mL via INTRAVENOUS

## 2023-11-29 MED ORDER — CHLORHEXIDINE GLUCONATE 4 % EX SOLN: 60.0000 mL | Freq: Once | CUTANEOUS | Status: DC

## 2023-11-29 MED ORDER — CHLORHEXIDINE GLUCONATE 4 % EX SOLN
60.0000 mL | Freq: Once | CUTANEOUS | Status: AC
Start: 2023-11-30 — End: 2023-11-30
  Administered 2023-11-30: 4 via TOPICAL
  Filled 2023-11-29: qty 15

## 2023-11-29 MED ORDER — SODIUM CHLORIDE 0.9 % IV SOLN
250.0000 mL | INTRAVENOUS | Status: DC
  Administered 2023-11-29: 250 mL via INTRAVENOUS

## 2023-11-29 MED ORDER — SODIUM CHLORIDE 0.9% FLUSH: 3.0000 mL | INTRAVENOUS | Status: DC | PRN

## 2023-11-29 MED ORDER — SODIUM CHLORIDE 0.9 % IV SOLN
80.0000 mg | INTRAVENOUS | Status: AC
  Administered 2023-11-30: 80 mg
  Filled 2023-11-29: qty 2

## 2023-11-29 MED ORDER — VANCOMYCIN HCL IN DEXTROSE 1-5 GM/200ML-% IV SOLN
1000.0000 mg | INTRAVENOUS | Status: AC
  Administered 2023-11-30: 1000 mg via INTRAVENOUS

## 2023-11-29 NOTE — Progress Notes (Addendum)
Rounding Note    Patient Name: Briana Craig Date of Encounter: 11/29/2023  [facility] Cardiologist: None   Subjective   "I'm doing OK" no CP, no SOB, asks about BS monitoring with "borderline DM/diet managed"  Inpatient Medications    Scheduled Meds:  famotidine  20 mg Oral QHS   fentaNYL (SUBLIMAZE) injection  25 mcg Intravenous Once   latanoprost  1 drop Both Eyes QHS   NIFEdipine  30 mg Oral Daily   pantoprazole  40 mg Oral Daily   simvastatin  20 mg Oral q1800   sodium chloride flush  10 mL Intrapleural Q8H   sodium chloride flush  3 mL Intravenous Q12H   Continuous Infusions:  PRN Meds: acetaminophen, ALPRAZolam, hydrALAZINE, nitroGLYCERIN, ondansetron (ZOFRAN) IV, oxyCODONE-acetaminophen   Vital Signs    Vitals:   11/29/23 0018 11/29/23 0430 11/29/23 0600 11/29/23 0813  BP: 110/63 (!) 91/59 100/62 92/63  Pulse:   71 86  Resp:   18 19  Temp:    (!) 97.5 F (36.4 C)  TempSrc:    Oral  SpO2: 91% 93%  92%  Weight:      Height:        Intake/Output Summary (Last 24 hours) at 11/29/2023 0824 Last data filed at 11/29/2023 1610 Gross per 24 hour  Intake 540 ml  Output 650 ml  Net -110 ml      11/28/2023    3:52 AM 11/26/2023   10:42 AM 10/12/2023    3:15 PM  Last 3 Weights  Weight (lbs) 112 lb 1.6 oz 112 lb 116 lb  Weight (kg) 50.848 kg 50.803 kg 52.617 kg      Telemetry    SR w/V pacing, no evidence of LOC at current programming - Personally Reviewed  ECG     No new EKGs - Personally Reviewed  Physical Exam   GEN: No acute distress.   Neck: No JVD Cardiac: RRR, no murmurs, rubs, or gallops.  Respiratory: CTA b/l, no absent BS,  CT site CDI. GI: Soft, nontender, non-distended  MS: No edema; No deformity. Neuro:  Nonfocal  Psych: Normal affect   PPM site: stable, no bleeding or hematoma  Labs    High Sensitivity Troponin:   Recent Labs  Lab 11/26/23 1058 11/26/23 1258 11/27/23 0401  TROPONINIHS 18* 21* 28*      Chemistry Recent Labs  Lab 11/26/23 1058 11/28/23 1244 11/29/23 0357  NA 140 136 134*  K 4.7 4.1 3.5  CL 107 100 102  CO2 23 23 24   GLUCOSE 161* 138* 127*  BUN 21 7* 7*  CREATININE 1.08* 0.84 0.76  CALCIUM 9.6 9.5 8.9  MG  --  1.9 2.0  PROT 7.1  --   --   ALBUMIN 3.9  --   --   AST 70*  --   --   ALT 60*  --   --   ALKPHOS 65  --   --   BILITOT 0.8  --   --   GFRNONAA 52* >60 >60  ANIONGAP 10 13 8     Lipids No results for input(s): "CHOL", "TRIG", "HDL", "LABVLDL", "LDLCALC", "CHOLHDL" in the last 168 hours.  Hematology Recent Labs  Lab 11/26/23 1058 11/28/23 1244 11/29/23 0357  WBC 6.2 9.0 6.5  RBC 3.86* 4.25 3.76*  HGB 11.7* 12.8 11.5*  HCT 34.7* 38.3 34.1*  MCV 89.9 90.1 90.7  MCH 30.3 30.1 30.6  MCHC 33.7 33.4 33.7  RDW 14.8 14.4 14.3  PLT 183 202 187   Thyroid No results for input(s): "TSH", "FREET4" in the last 168 hours.  BNP Recent Labs  Lab 11/26/23 1115  BNP 1,583.0*    DDimer No results for input(s): "DDIMER" in the last 168 hours.   Radiology    DG Chest Port 1 View Result Date: 11/28/2023 CLINICAL DATA:  Pneumothorax EXAM: PORTABLE CHEST - 1 VIEW COMPARISON:  the previous day's study FINDINGS: Pigtail chest tube remains directed to the left lung apex. No pneumothorax evident. Some increase in patchy airspace disease at the left lung base. Coarse attenuated bilateral interstitial markings as before. Heart size normal. Stable left subclavian dual lead pacemaker. Aortic Atherosclerosis (ICD10-170.0). No effusion. Visualized bones unremarkable. IMPRESSION: 1. No pneumothorax. 2. Increasing left basilar airspace disease. Electronically Signed   By: Corlis Leak M.D.   On: 11/28/2023 09:40   DG Chest Port 1 View Result Date: 11/27/2023 CLINICAL DATA:  Follow-up left pneumothorax following chest tube placement. EXAM: PORTABLE CHEST 1 VIEW COMPARISON:  Earlier today. FINDINGS: Interval left pleural pigtail catheter with its tip at the left lung apex. The  previously demonstrated left pneumothorax is no longer seen. Decreased airspace opacity and linear density in the left upper and lower lung zones and right mid lung zone. Stable mild chronic interstitial prominence and left subclavian pacer leads. Normal sized heart. Tortuous and partially calcified thoracic aorta. Mild lower thoracic spine degenerative changes superior migration of both humeral heads, suggesting bilateral chronic rotator cuff tears. IMPRESSION: 1. Interval left pleural pigtail catheter placement with resolution of the previously demonstrated left pneumothorax. 2. Decreased bilateral atelectasis and possible atypical infection. Electronically Signed   By: Beckie Salts M.D.   On: 11/27/2023 13:56    Cardiac Studies   08/17/2023: TTE 1. Left ventricular ejection fraction, by estimation, is 60 to 65%. The  left ventricle has normal function. Left ventricular endocardial border  not optimally defined to evaluate regional wall motion. Wall motion  grossly norma. Left ventricular diastolic   parameters were normal.   2. Right ventricular systolic function is normal. The right ventricular  size is normal. There is normal pulmonary artery systolic pressure. The  estimated right ventricular systolic pressure is 33.5 mmHg.   3. The mitral valve is grossly normal. Trivial mitral valve  regurgitation. No evidence of mitral stenosis.   4. The aortic valve is tricuspid. Aortic valve regurgitation is mild. No  aortic stenosis is present.   5. The inferior vena cava is normal in size with greater than 50%  respiratory variability, suggesting right atrial pressure of 3 mmHg.   Comparison(s): No prior Echocardiogram.   Patient Profile     78 y.o. female w/PMHx of HTN, HLD, esophageal Ca (tx w/chemo and XRT), symptomatic bradycardia with conduction system disease including CHB > s/p PPM 09/24/23 admitted with progressive weakness, DOE found to have RV lead failure in CHB 40's  Assessment &  Plan    PPM lead failure RV lead loss of capture CXR with grossly stable lead position > likely micro dislodgement Programmed 6V/1.40ms unipolar currently  I have placed her on the schedule with Dr. Ladona Ridgel tomorrow Lead revision/removal No need for anesthesia or CTS support NPO after MN tonight     CHB As above   HTN Home meds   GERD (severe) Hx of esophageal cancer/XRT/surgery) Keep HOB up 15degrees Home meds   5. Elevated BNP Exam did/does not support overt volume OL   6. PTX Appreciate CCM/pulm assistance Scheduled for lead removal/revision with  Dr. Ladona Ridgel tomorrow (11/29/22) Would like the CT to remain in place until after this procedure please    7. Pt reports borderline DM Manages her diet closely at home Reports BS tend to get low in the mornings (127 this AM with BMET) Requests BS monitoring (ordered CBGs AM, AC, HS)   For questions or updates, please contact [facility] Please consult [email or web address] for contact info under        Signed, Sheilah Pigeon, PA-C  11/29/2023, 8:24 AM

## 2023-11-29 NOTE — Progress Notes (Signed)
Mobility Specialist Progress Note;   11/29/23 1100  Therapy Vitals  BP (!) 92/55  Mobility  Activity Ambulated with assistance to bathroom;Transferred from bed to chair  Level of Assistance Contact guard assist, steadying assist  Assistive Device None  Distance Ambulated (ft) 20 ft  Activity Response Tolerated fair  Mobility Referral Yes  Mobility visit 1 Mobility  Mobility Specialist Start Time (ACUTE ONLY) 1040  Mobility Specialist Stop Time (ACUTE ONLY) 1100  Mobility Specialist Time Calculation (min) (ACUTE ONLY) 20 min   Pt agreeable to mobility. Requested assistance to BR at BOS, void successful. C/o feeling weak and lightheaded, deferring ambulation at this time however requesting to sit in chair. Required MinG assistance throughout for safety. BP noted above once sitting, however pt stated lightheadedness had settled. Pt left in chair with all needs met, call bell in reach.   Caesar Bookman Mobility Specialist Please contact via SecureChat or [employer] [phone]

## 2023-11-29 NOTE — Plan of Care (Signed)
  Problem: Education: Goal: Knowledge of General Education information will improve Description: Including pain rating scale, medication(s)/side effects and non-pharmacologic comfort measures Outcome: Progressing   Problem: Health Behavior/Discharge Planning: Goal: Ability to manage health-related needs will improve Outcome: Progressing   Problem: Clinical Measurements: Goal: Ability to maintain clinical measurements within normal limits will improve Outcome: Progressing Goal: Will remain free from infection Outcome: Progressing Goal: Diagnostic test results will improve Outcome: Progressing Goal: Respiratory complications will improve Outcome: Progressing Goal: Cardiovascular complication will be avoided Outcome: Progressing   Problem: Activity: Goal: Risk for activity intolerance will decrease Outcome: Progressing   Problem: Nutrition: Goal: Adequate nutrition will be maintained Outcome: Progressing   Problem: Coping: Goal: Level of anxiety will decrease Outcome: Progressing   Problem: Elimination: Goal: Will not experience complications related to bowel motility Outcome: Progressing Goal: Will not experience complications related to urinary retention Outcome: Progressing   Problem: Pain Managment: Goal: General experience of comfort will improve and/or be controlled Outcome: Progressing   Problem: Safety: Goal: Ability to remain free from injury will improve Outcome: Progressing   Problem: Skin Integrity: Goal: Risk for impaired skin integrity will decrease Outcome: Progressing   Problem: Education: Goal: Knowledge of cardiac device and self-care will improve Outcome: Progressing Goal: Ability to safely manage health related needs after discharge will improve Outcome: Progressing Goal: Individualized Educational Video(s) Outcome: Progressing   Problem: Cardiac: Goal: Ability to achieve and maintain adequate cardiopulmonary perfusion will  improve Outcome: Progressing   Problem: Education: Goal: Knowledge of General Education information will improve Description: Including pain rating scale, medication(s)/side effects and non-pharmacologic comfort measures Outcome: Progressing   Problem: Health Behavior/Discharge Planning: Goal: Ability to manage health-related needs will improve Outcome: Progressing   Problem: Clinical Measurements: Goal: Ability to maintain clinical measurements within normal limits will improve Outcome: Progressing Goal: Will remain free from infection Outcome: Progressing Goal: Diagnostic test results will improve Outcome: Progressing Goal: Respiratory complications will improve Outcome: Progressing Goal: Cardiovascular complication will be avoided Outcome: Progressing   Problem: Activity: Goal: Risk for activity intolerance will decrease Outcome: Progressing   Problem: Nutrition: Goal: Adequate nutrition will be maintained Outcome: Progressing   Problem: Coping: Goal: Level of anxiety will decrease Outcome: Progressing   Problem: Elimination: Goal: Will not experience complications related to bowel motility Outcome: Progressing Goal: Will not experience complications related to urinary retention Outcome: Progressing   Problem: Pain Managment: Goal: General experience of comfort will improve and/or be controlled Outcome: Progressing   Problem: Safety: Goal: Ability to remain free from injury will improve Outcome: Progressing   Problem: Skin Integrity: Goal: Risk for impaired skin integrity will decrease Outcome: Progressing   Problem: Education: Goal: Knowledge of cardiac device and self-care will improve Outcome: Progressing Goal: Ability to safely manage health related needs after discharge will improve Outcome: Progressing Goal: Individualized Educational Video(s) Outcome: Progressing   Problem: Cardiac: Goal: Ability to achieve and maintain adequate cardiopulmonary  perfusion will improve Outcome: Progressing

## 2023-11-29 NOTE — TOC Initial Note (Signed)
Transition of Care [facility]) - Initial/Assessment Note    Patient Details  Name: Briana Craig MRN: [id number] Date of Birth: 1946-07-13  [facility]) CM/SW Contact:    Gala Lewandowsky, RN Phone Number: 11/29/2023, 3:20 PM  Clinical Narrative: Patient presented for pacemaker failure. PTA patient was from home with support of sister [name]. Patient states she still drives and has a rollator in the home. Case Manager will follow for disposition needs as she progresses.                  Expected Discharge Plan: Home w Home Health Services Barriers to Discharge: Continued Medical Work up   Patient Goals and CMS Choice Patient states their goals for this hospitalization and ongoing recovery are:: plan for transition home.  Expected Discharge Plan and Services In-house Referral: NA Discharge Planning Services: CM Consult Post Acute Care Choice: Home Health Living arrangements for the past 2 months: Single Family Home  Prior Living Arrangements/Services Living arrangements for the past 2 months: Single Family Home Lives with:: Siblings (Sister [name]) Patient language and need for interpreter reviewed:: Yes Do you feel safe going back to the place where you live?: Yes      Need for Family Participation in Patient Care: Yes (Comment) Care giver support system in place?: Yes (comment) Current home services: DME (rollator) Criminal Activity/Legal Involvement Pertinent to Current Situation/Hospitalization: No - Comment as needed  Activities of Daily Living   ADL Screening (condition at time of admission) Independently performs ADLs?: Yes (appropriate for developmental age) Is the patient deaf or have difficulty hearing?: No Does the patient have difficulty seeing, even when wearing glasses/contacts?: No Does the patient have difficulty concentrating, remembering, or making decisions?: No  Permission Sought/Granted Permission sought to share information with :  Family Supports, Case Manager    Emotional Assessment Appearance:: Appears stated age Attitude/Demeanor/Rapport: Engaged Affect (typically observed): Appropriate Orientation: : Oriented to Self, Oriented to Place, Oriented to  Time Alcohol / Substance Use: Not Applicable Psych Involvement: No (comment)  Admission diagnosis:  Pacemaker failure, initial encounter [T82.118A] Patient Active Problem List   Diagnosis Date Noted   Pacemaker failure, initial encounter 11/26/2023   Abnormal CT of the chest 10/12/2023   SOB (shortness of breath) 07/16/2023   Palpitation 07/16/2023   Wrist pain, acute, right 06/03/2023   Age-related osteoporosis without current pathological fracture 11/27/2021   Preventative health care 05/27/2021   Periorbital cellulitis of right eye 09/11/2019   Diet-controlled diabetes mellitus (HCC) 05/22/2019   Ductal carcinoma in situ (DCIS) of right breast 05/22/2019   Nipple lesion 11/21/2018   Controlled type 2 diabetes mellitus without complication, without long-term current use of insulin (HCC) 05/19/2018   Hyperlipidemia associated with type 2 diabetes mellitus (HCC) 05/19/2018   Vitamin D deficiency 05/19/2018   History of esophageal cancer 05/19/2018   Hematuria 04/08/2015   UTI (urinary tract infection) 04/04/2015   DM (diabetes mellitus) type II uncontrolled, periph vascular disorder 12/06/2014   Dysuria 01/25/2014   IFG (impaired fasting glucose) 01/25/2014   Vitamin D deficiency 05/30/2013   Encounter for therapeutic drug monitoring 05/30/2013   Impaired fasting glucose 05/30/2013   Other malaise and fatigue 05/30/2013   Hyperlipidemia 05/30/2013   GERD (gastroesophageal reflux disease) 05/30/2013   Need for prophylactic vaccination with combined diphtheria-tetanus-pertussis (DTP) vaccine 05/30/2013   Esophageal cancer (HCC)    Dysphagia 11/17/2011   Cancer of esophagus (HCC) 11/13/2011   PCP:  Donato Schultz, DO Pharmacy:  [id number] - [place], [place] - [address] [place] [address] Phone: [phone] Fax: [phone]  [facility] - [place], [place] - [address] [place] [address] Phone: [phone] Fax: [phone]   Social Drivers of Health (SDOH) Social History: SDOH Screenings   Food Insecurity: No Food Insecurity (11/26/2023)  Housing: Low Risk  (11/26/2023)  Transportation Needs: No Transportation Needs (11/26/2023)  Utilities: Not At Risk (11/26/2023)  Alcohol Screen: Low Risk  (01/11/2023)  Depression (PHQ2-9): Low Risk  (01/11/2023)  Financial Resource Strain: Low Risk  (01/06/2022)  Physical Activity: Sufficiently Active (01/06/2022)  Social Connections: Socially Isolated (11/26/2023)  Stress: No Stress Concern Present (12/19/2020)  Tobacco Use: Medium Risk (11/26/2023)    Readmission Risk Interventions     No data to display

## 2023-11-29 NOTE — Progress Notes (Signed)
NAME:  Briana Craig, MRN:  [id number], DOB:  [birth year], LOS: 3 ADMISSION DATE:  11/26/2023, CONSULTATION DATE:  11/27/23 REFERRING MD:  Dr. Nelly Laurence, CHIEF COMPLAINT:  pneumothorax   History of Present Illness:   33 yoF with PMH as below significant for complete heart block s/p PPM 09/24/23, HTN, HLD, GERD, former smoker with hx of esophgeal cancer w/ adjuvant therapies and surgical resection 20212 and prior DCIS of right breast admitted 11/26/23 for malfunctioning pacemaker due to suspected micro-dislodgement of RV lead with symptomatic bradycardia/ CHB w/HR in 40's and 3 week hx of intermittent SOB admitted by EP.   Adjustments made to high output with unipolar mode with better capturing.  Taken 1/24 for lead revision attempted but EP unable to pass wire.  Left upper extremity venogram therefore performed showing occlusion of axillary vein but still unable to pass wire.  Plans for RV lead extraction and replacement of RV lead TBD.  On follow up CXR, pt found to have moderate left sided pneumothorax with small associated left pleural effusion with left basilar collapse.  Pt asymptomatic other than slight pressure in lower neck/ upper chest area and remains hemodynamically stable, PCCM consulted for further management.   Of note, pt also followed by Dr. Delton Coombes in our clinic for RUL cavitary nodule seen on CT in 08/2023, suspected to be related to chronic aspiration/ reflux with inflammatory changes but can not rule out malignancy given hx.  Plans for repeat chest CT in April.    Pertinent  Medical History  Complete heart block s/p PPM, HTN, HLD, esophageal cancer s/p chemo/ XRT Past Medical History:  Diagnosis Date   Breast cancer (HCC) 12/2018   right DCIS   Dysphagia    Esophageal cancer (HCC)    GERD (gastroesophageal reflux disease)    Hyperlipemia    Hypertension    no meds now   Impaired fasting blood sugar    Personal history of radiation therapy    PONV (postoperative nausea and  vomiting)    Pre-diabetes    Significant Hospital Events: Including procedures, antibiotic start and stop dates in addition to other pertinent events   1/24 admitted, attempted PM lead revision 1/25 asymptomatic left PTX 1/27 CXR this am shows no pneumothorax.  Chest tube still to wall suction.  Interim History / Subjective:  No chest pain.  Denies nausea.  Anticipating replacement of RV PPM today.  NPO.  No air leak noted today with deep inspiration or coughing.  Objective   Blood pressure 92/63, pulse 86, temperature (!) 97.5 F (36.4 C), temperature source Oral, resp. rate 19, height 5\' 2"  (1.575 m), weight 50.8 kg, SpO2 92%.        Intake/Output Summary (Last 24 hours) at 11/29/2023 0834 Last data filed at 11/29/2023 4098 Gross per 24 hour  Intake 540 ml  Output 650 ml  Net -110 ml   Filed Weights   11/26/23 1042 11/28/23 0352  Weight: 50.8 kg 50.8 kg   Examination: General:  Older adult female sitting in bed in NAD Neuro:  alert, oriented, appropriate, MAE CV: paced, dressing to left chest cdi PULM: No accessory muscle use, decreased breath sounds on left.  No air leak with deep breathing or coughing Extremities: warm/dry, no LE edema    Resolved Hospital Problem list    Assessment & Plan:   Left pneumothorax after attempted PPM lead revision Status post pigtail P:  Chest tube to water seal today. CXR in 6 hours.  If negative and  no air leak, will consider stressing tube overnight. Low threshold to add antibiotics if fever for left basilar airspace disease PCCM will continue to follow for management.   Cavitary lesion right upper lobe 11 mm History of esophageal cancer with gastric pull-up 2012 Repeat CT chest planned in April 2025   CHB s/p PPM 09/2023 with RV lead dysfunction with symptomatic bradycardia Left axillary vein occlusion  P:  Per EP, plans for RV lead extraction and replacement of RV lead TBD.  Programming since changed to unipolar pacing with  good capture thus far   Best Practice (right click and "Reselect all SmartList Selections" daily)  Per primary  Labs   CBC: Recent Labs  Lab 11/26/23 1058 11/28/23 1244 11/29/23 0357  WBC 6.2 9.0 6.5  NEUTROABS 4.5  --   --   HGB 11.7* 12.8 11.5*  HCT 34.7* 38.3 34.1*  MCV 89.9 90.1 90.7  PLT 183 202 187    Basic Metabolic Panel: Recent Labs  Lab 11/26/23 1058 11/28/23 1244 11/29/23 0357  NA 140 136 134*  K 4.7 4.1 3.5  CL 107 100 102  CO2 23 23 24   GLUCOSE 161* 138* 127*  BUN 21 7* 7*  CREATININE 1.08* 0.84 0.76  CALCIUM 9.6 9.5 8.9  MG  --  1.9 2.0   GFR: Estimated Creatinine Clearance: 45.1 mL/min (by C-G formula based on SCr of 0.76 mg/dL). Recent Labs  Lab 11/26/23 1058 11/28/23 1244 11/29/23 0357  WBC 6.2 9.0 6.5    Liver Function Tests: Recent Labs  Lab 11/26/23 1058  AST 70*  ALT 60*  ALKPHOS 65  BILITOT 0.8  PROT 7.1  ALBUMIN 3.9   No results for input(s): "LIPASE", "AMYLASE" in the last 168 hours. No results for input(s): "AMMONIA" in the last 168 hours.  ABG    Component Value Date/Time   TCO2 25 02/19/2011 0959     Coagulation Profile: No results for input(s): "INR", "PROTIME" in the last 168 hours.  Cardiac Enzymes: No results for input(s): "CKTOTAL", "CKMB", "CKMBINDEX", "TROPONINI" in the last 168 hours.  HbA1C: Hgb A1c MFr Bld  Date/Time Value Ref Range Status  07/16/2023 12:26 PM 7.1 (H) 4.6 - 6.5 % Final    Comment:    Glycemic Control Guidelines for People with Diabetes:Non Diabetic:  <6%Goal of Therapy: <7%Additional Action Suggested:  >8%   05/27/2023 08:05 AM 6.9 (H) 4.6 - 6.5 % Final    Comment:    Glycemic Control Guidelines for People with Diabetes:Non Diabetic:  <6%Goal of Therapy: <7%Additional Action Suggested:  >8%     CBG: Recent Labs  Lab 11/28/23 0532  GLUCAP 115*   Marcelle Smiling, MD Board Certified by the ABIM, Pulmonary Diseases & Critical Care Medicine  If no response to pager , please  call [phone] until 7 pm After 7:00 pm call Elink  [phone]   11/29/2023, 8:34 AM

## 2023-11-30 ENCOUNTER — Encounter (HOSPITAL_COMMUNITY)
Admission: EM | Disposition: A | Discharge status: Home / Self Care | Payer: Self-pay | Source: Home / Self Care | Attending: Cardiology

## 2023-11-30 ENCOUNTER — Other Ambulatory Visit: Payer: Self-pay

## 2023-11-30 ENCOUNTER — Inpatient Hospital Stay (HOSPITAL_COMMUNITY): Payer: Medicare PPO

## 2023-11-30 DIAGNOSIS — R918 Other nonspecific abnormal finding of lung field: Secondary | ICD-10-CM | POA: Diagnosis not present

## 2023-11-30 DIAGNOSIS — J939 Pneumothorax, unspecified: Secondary | ICD-10-CM | POA: Diagnosis not present

## 2023-11-30 DIAGNOSIS — T82118A Breakdown (mechanical) of other cardiac electronic device, initial encounter: Secondary | ICD-10-CM | POA: Diagnosis not present

## 2023-11-30 HISTORY — PX: LEAD REVISION/REPAIR: EP1213

## 2023-11-30 LAB — CBC
HCT: 36.8 % (ref 36.0–46.0)
Hemoglobin: 12.6 g/dL (ref 12.0–15.0)
MCH: 30.7 pg (ref 26.0–34.0)
MCHC: 34.2 g/dL (ref 30.0–36.0)
MCV: 89.8 fL (ref 80.0–100.0)
Platelets: 207 10*3/uL (ref 150–400)
RBC: 4.1 MIL/uL (ref 3.87–5.11)
RDW: 14.1 % (ref 11.5–15.5)
WBC: 7 10*3/uL (ref 4.0–10.5)
nRBC: 0 % (ref 0.0–0.2)

## 2023-11-30 LAB — BASIC METABOLIC PANEL
Anion gap: 10 (ref 5–15)
BUN: 9 mg/dL (ref 8–23)
CO2: 24 mmol/L (ref 22–32)
Calcium: 9.5 mg/dL (ref 8.9–10.3)
Chloride: 104 mmol/L (ref 98–111)
Creatinine, Ser: 0.76 mg/dL (ref 0.44–1.00)
GFR, Estimated: >60 mL/min (ref >60)
Glucose, Bld: 118 mg/dL — ABNORMAL HIGH (ref 70–99)
Potassium: 4 mmol/L (ref 3.5–5.1)
Sodium: 138 mmol/L (ref 135–145)

## 2023-11-30 LAB — PREPARE RBC (CROSSMATCH)

## 2023-11-30 LAB — MAGNESIUM: Magnesium: 2 mg/dL (ref 1.7–2.4)

## 2023-11-30 LAB — GLUCOSE, CAPILLARY
Glucose-Capillary: 104 mg/dL — ABNORMAL HIGH (ref 70–99)
Glucose-Capillary: 103 mg/dL — ABNORMAL HIGH (ref 70–99)
Glucose-Capillary: 106 mg/dL — ABNORMAL HIGH (ref 70–99)
Glucose-Capillary: 168 mg/dL — ABNORMAL HIGH (ref 70–99)

## 2023-11-30 LAB — ABO/RH: ABO/RH(D): A POS

## 2023-11-30 SURGERY — LEAD REVISION/REPAIR

## 2023-11-30 MED ORDER — FENTANYL CITRATE (PF) 100 MCG/2ML IJ SOLN
INTRAMUSCULAR | Status: AC
  Filled 2023-11-30: qty 2

## 2023-11-30 MED ORDER — MIDAZOLAM HCL 5 MG/5ML IJ SOLN
INTRAMUSCULAR | Status: DC | PRN
  Administered 2023-11-30 (×4): 1 mg via INTRAVENOUS

## 2023-11-30 MED ORDER — VANCOMYCIN HCL IN DEXTROSE 1-5 GM/200ML-% IV SOLN
INTRAVENOUS | Status: AC
  Filled 2023-11-30: qty 200

## 2023-11-30 MED ORDER — MIDAZOLAM HCL 2 MG/2ML IJ SOLN
INTRAMUSCULAR | Status: AC
  Filled 2023-11-30: qty 2

## 2023-11-30 MED ORDER — FENTANYL CITRATE (PF) 100 MCG/2ML IJ SOLN
INTRAMUSCULAR | Status: DC | PRN
  Administered 2023-11-30 (×4): 12.5 ug via INTRAVENOUS

## 2023-11-30 MED ORDER — LIDOCAINE HCL (PF) 1 % IJ SOLN
INTRAMUSCULAR | Status: DC | PRN
  Administered 2023-11-30: 60 mL

## 2023-11-30 MED ORDER — LIDOCAINE HCL (PF) 1 % IJ SOLN
INTRAMUSCULAR | Status: AC
  Filled 2023-11-30: qty 60

## 2023-11-30 MED ORDER — SODIUM CHLORIDE 0.9 % IV SOLN
INTRAVENOUS | Status: AC
  Filled 2023-11-30: qty 2

## 2023-11-30 MED ORDER — SODIUM CHLORIDE 0.9% IV SOLUTION: Freq: Once | INTRAVENOUS | Status: DC

## 2023-11-30 MED ORDER — CEFAZOLIN SODIUM-DEXTROSE 1-4 GM/50ML-% IV SOLN
1.0000 g | Freq: Four times a day (QID) | INTRAVENOUS | Status: AC
  Administered 2023-11-30 – 2023-12-01 (×3): 1 g via INTRAVENOUS
  Filled 2023-11-30 (×3): qty 50

## 2023-11-30 MED ORDER — HEPARIN (PORCINE) IN NACL 1000-0.9 UT/500ML-% IV SOLN
INTRAVENOUS | Status: DC | PRN
  Administered 2023-11-30: 500 mL

## 2023-11-30 MED FILL — Midazolam HCl Inj 2 MG/2ML (Base Equivalent): INTRAMUSCULAR | Qty: 2 | Status: AC

## 2023-11-30 SURGICAL SUPPLY — 13 items
GUIDEWIRE ANGLED .035X150CM (WIRE) IMPLANT
IPG PACE AZUR XT DR MRI W1DR01 (Pacemaker) IMPLANT
KIT LEAD ACCESSORY 6056 PINCH (KITS) IMPLANT
KIT WRENCH (KITS) IMPLANT
LEAD CAPSURE NOVUS 45CM (Lead) IMPLANT
LEAD CAPSURE NOVUS 5076-52CM (Lead) IMPLANT
PACE AZURE XT DR MRI W1DR01 (Pacemaker) ×1 IMPLANT
PAD DEFIB RADIO PHYSIO CONN (PAD) ×1 IMPLANT
POUCH AIGIS-R ANTIBACT PPM (Mesh General) ×1 IMPLANT
POUCH AIGIS-R ANTIBACT PPM MED (Mesh General) IMPLANT
SHEATH 7FR PRELUDE SNAP 13 (SHEATH) IMPLANT
TRAY PACEMAKER INSERTION (PACKS) ×1 IMPLANT
WIRE MICRO SET SILHO 5FR 7 (SHEATH) IMPLANT

## 2023-11-30 NOTE — Discharge Summary (Signed)
ELECTROPHYSIOLOGY PROCEDURE DISCHARGE SUMMARY    Patient ID: Briana Craig,  MRN: [id number], DOB/AGE: 11/28/1945 79 y.o.  Admit date: 11/26/2023 Discharge date: 12/02/2023  Primary Care Physician: Donato Schultz, DO  Primary Cardiologist: None  Electrophysiologist: Dr. Nelly Laurence   Primary Discharge Diagnosis:  Symptomatic bradycardia status post pacemaker implantation this admission  Secondary Discharge Diagnosis:  Occluded Right Subclavian  HTN Iatrogenic Pneumothorax  RUL Cavitary Lesion  Hx Esophageal Cancer with Gastric Pull Up  Allergies  Allergen Reactions   Taxol [Paclitaxel] Hives and Itching   Tramadol Other (See Comments)    "Sweaty, shaky and faint"   Carboplatin Hives and Itching   Codeine Other (See Comments)    Could not sleep, Shaky and Sweaty   Compazine  [Prochlorperazine Maleate] Other (See Comments)    Severe chest pain   Penicillins Other (See Comments)    Childhood   Prochlorperazine Edisylate Other (See Comments)    Severe chest pain     Procedures This Admission:  1.  Implantation of a Medtronic Dual Chamber PPM revision by Dr. Ladona Ridgel (pt follows with Dr. Nelly Laurence). The patient received a Medtronic Azure XT DR [id number]  with a Medtronic CapSureFix Novus 5076 right atrial lead and a Medtronic CapSureFix Novus 5076 right ventricular lead.  There were no immediate post procedure complications.   2.  CXR on  12/01/23  demonstrated no pneumothorax status post device implantation.       Brief HPI: Briana Craig is a 78 y.o. female with a past medical history of HTN, HLD, esophageal CA s/p chemo/XRT, and symptomatic bradycardia / CHB.  She underwent PPM insertion 09/24/2023 in the setting of symptomatic bradycardia due to paroxysmal AVB.  Her post-implant course was uneventful.  She followed up in clinic and was noted to have "double R waves" which was corrected by adjustment to her sensitivity.  Otherwise, measurements/thresholds were  similar to implant.  She reported to the ER 11/26/23 with reports of 3 weeks of progressive shortness of breath similar to pre-pacing.  She was found to be in CHB in the 40's with lack of pacer capture. Device was reprogrammed at [facility] @1ms  in unipolar with capture. Risks, benefits, and alternatives to PPM implantation were reviewed with the patient who wished to proceed .   She underwent attempted revision of her RV lead on 11/26/23 but was found on venogram to have an occlusion of the axillary vein and attempts to pass the wire into the heart were unsuccessful. Procedure was ultimately aborted.  Post procedure CXR showed a left pneumothorax and she required placement of small bore chest tube for decompression of pneumothorax.  CT was removed on 12/01/23 with subsequent CXR on 1/30 with no residual pneumothorax. During admit, she had elevated blood pressure and nifedipine was initiated with good blood pressure control.  The patient subsequently underwent a second attempt at RV lead revision on 11/30/23 with new dual chamber pacing leads placed into the RA and RV.    Hospital Course:  The patient was admitted and underwent revision of a Medtronic dual chamber PPM with details as outlined above.  She was monitored on telemetry overnight which demonstrated NSR with occasional VP.  Left chest was without hematoma or ecchymosis.  The device was interrogated and found to be functioning normally.  CXR was obtained and demonstrated no pneumothorax status post device implantation.  Wound care, arm mobility, and restrictions were reviewed with the patient.  The patient was examined and considered stable  for discharge to home.    Anticoagulation resumption This patient is not on anticoagulation    Physical Exam: Vitals:   12/01/23 2010 12/01/23 2329 12/02/23 0309 12/02/23 0736  BP: 126/83 139/88 116/88 111/80  Pulse: (!) 107 87 87   Resp: 19 14 15    Temp: 98.2 F (36.8 C) 98.4 F (36.9 C) 98.4 F (36.9 C) 98.1  F (36.7 C)  TempSrc: Oral Oral Oral Oral  SpO2:  96% 93%   Weight:      Height:        GEN- NAD. A&O x 3.  HEENT: Normocephalic, atraumatic Lungs- CTAB, Normal effort.  Heart- RRR, No M/G/R. Left chest site clean/dry/intact, steri-strips in place without hematoma or significant swelling GI- Soft, NT, ND.  Extremities- No clubbing, cyanosis, or edema;  Skin- warm and dry, no rash or lesion, left chest without hematoma/ecchymosis  Discharge Medications:  Allergies as of 12/02/2023       Reactions   Taxol [paclitaxel] Hives, Itching   Tramadol Other (See Comments)   "Sweaty, shaky and faint"   Carboplatin Hives, Itching   Codeine Other (See Comments)   Could not sleep, Shaky and Sweaty   Compazine  [prochlorperazine Maleate] Other (See Comments)   Severe chest pain   Penicillins Other (See Comments)   Childhood   Prochlorperazine Edisylate Other (See Comments)   Severe chest pain        Medication List     TAKE these medications    Accu-Chek Aviva Plus test strip Generic drug: glucose blood Use to check blood sugar once daily.  DX  E 11.9   Accu-Chek Aviva Soln Use to check control when opening a new bottle of strips.  DX E11.9   Accu-Chek Softclix Lancets lancets Use to check blood sugar once a day.  DX  E11.9   TRUEplus Lancets 33G Misc Use as directed. Check glucose up to twice daily. E11.9   Alcohol Wipes 70 % Pads To use to check sugars   cetirizine 10 MG tablet Commonly known as: ZYRTEC Take 10 mg by mouth daily as needed for allergies.   CO Q 10 PO Take 1 capsule by mouth at bedtime. Gel capsule   famotidine 40 MG tablet Commonly known as: PEPCID Take 1 tablet (40 mg total) by mouth at bedtime.   FISH OIL PO Take 1,200 mg by mouth daily. Krill Oil   GLUCOSAMINE CHONDROITIN JOINT PO Take 2,000 mg by mouth daily.   latanoprost 0.005 % ophthalmic solution Commonly known as: XALATAN Place 1 drop into both eyes at bedtime.   MULTIVITAMIN  PO Take 1 tablet by mouth daily. Centrum silver 50+   NIFEdipine 30 MG 24 hr tablet Commonly known as: ADALAT CC Take 1 tablet (30 mg total) by mouth daily.   pantoprazole 40 MG tablet Commonly known as: PROTONIX Take 1 tablet (40 mg total) by mouth daily.   PreserVision AREDS Caps Take 1 capsule by mouth 2 (two) times daily.   simvastatin 20 MG tablet Commonly known as: ZOCOR Take 1 tablet (20 mg total) by mouth daily at 6 PM.   True Metrix Air Glucose Meter Devi Use as directed to check glucose twice daily. E11.9   Accu-Chek Aviva Plus w/Device Kit Use to check blood sugar once a day.  DX E11.9               Discharge Care Instructions  (From admission, onward)           Start  Ordered   12/02/23 0000  Discharge wound care:       Comments: See printed instructions > keep left chest clean and dry for 7 days.  Do not get wet.  Leave dressing intact as it is currently.   12/02/23 1022            Disposition:  Home with usual follow up as in AVS   Duration of Discharge Encounter:  APP time: 36 minutes  Signed, Canary Brim, NP-C, AGACNP-BC [facility] - Electrophysiology  12/02/2023, 10:24 AM

## 2023-11-30 NOTE — Interval H&P Note (Signed)
History and Physical Interval Note:  11/30/2023 3:08 PM  Briana Craig  has presented today for surgery, with the diagnosis of rv lead failure.  The various methods of treatment have been discussed with the patient and family. After consideration of risks, benefits and other options for treatment, the patient has consented to  Procedure(s): LEAD REVISION/REPAIR (N/A) as a surgical intervention.  The patient's history has been reviewed, patient examined, no change in status, stable for surgery.  I have reviewed the patient's chart and labs.  Questions were answered to the patient's satisfaction.     Lewayne Bunting

## 2023-11-30 NOTE — Progress Notes (Signed)
NAME:  Briana Craig, MRN:  [id number], DOB:  03-29-1946, LOS: 4 ADMISSION DATE:  11/26/2023, CONSULTATION DATE:  11/27/23 REFERRING MD:  Dr. Nelly Laurence, CHIEF COMPLAINT:  pneumothorax   History of Present Illness:   46 yoF with PMH as below significant for complete heart block s/p PPM 09/24/23, HTN, HLD, GERD, former smoker with hx of esophgeal cancer w/ adjuvant therapies and surgical resection 20212 and prior DCIS of right breast admitted 11/26/23 for malfunctioning pacemaker due to suspected micro-dislodgement of RV lead with symptomatic bradycardia/ CHB w/HR in 40's and 3 week hx of intermittent SOB admitted by EP.   Adjustments made to high output with unipolar mode with better capturing.  Taken 1/24 for lead revision attempted but EP unable to pass wire.  Left upper extremity venogram therefore performed showing occlusion of axillary vein but still unable to pass wire.  Plans for RV lead extraction and replacement of RV lead TBD.  On follow up CXR, pt found to have moderate left sided pneumothorax with small associated left pleural effusion with left basilar collapse.  Pt asymptomatic other than slight pressure in lower neck/ upper chest area and remains hemodynamically stable, PCCM consulted for further management.   Of note, pt also followed by Dr. Delton Coombes in our clinic for RUL cavitary nodule seen on CT in 08/2023, suspected to be related to chronic aspiration/ reflux with inflammatory changes but can not rule out malignancy given hx.  Plans for repeat chest CT in April.    Pertinent  Medical History  Complete heart block s/p PPM, HTN, HLD, esophageal cancer s/p chemo/ XRT Past Medical History:  Diagnosis Date   Breast cancer (HCC) 12/2018   right DCIS   Dysphagia    Esophageal cancer (HCC)    GERD (gastroesophageal reflux disease)    Hyperlipemia    Hypertension    no meds now   Impaired fasting blood sugar    Personal history of radiation therapy    PONV (postoperative nausea and  vomiting)    Pre-diabetes    Significant Hospital Events: Including procedures, antibiotic start and stop dates in addition to other pertinent events   1/24 admitted, attempted PM lead revision 1/25 asymptomatic left PTX 1/27 CXR this am shows no pneumothorax.  Chest tube still to wall suction. 1/28 scheduled for lead removal/revision today.  Interim History / Subjective:  No chest pain.  Denies nausea.  Anticipating replacement of RV PPM today.  NPO.  No air leak noted today with deep inspiration or coughing.  Objective   Blood pressure (P) 126/75, pulse 92, temperature (P) 97.7 F (36.5 C), temperature source (P) Oral, resp. rate 19, height 5\' 2"  (1.575 m), weight 50.8 kg, SpO2 (P) 94%.        Intake/Output Summary (Last 24 hours) at 11/30/2023 0720 Last data filed at 11/30/2023 0500 Gross per 24 hour  Intake 1005.35 ml  Output 818 ml  Net 187.35 ml   Filed Weights   11/26/23 1042 11/28/23 0352  Weight: 50.8 kg 50.8 kg   Examination: General:  Older adult female sitting in bed in NAD Neuro:  alert, oriented, appropriate, MAE CV: paced, dressing to left chest cdi PULM: No accessory muscle use, decreased breath sounds on left.  No air leak with deep breathing or coughing Extremities: warm/dry, no LE edema    Resolved Hospital Problem list    Assessment & Plan:   Left pneumothorax after attempted PPM lead revision Status post pigtail P:  Chest tube to water seal  today. CXR today.  If no pneumothorax is seen, will plan to clamp chest tube overnight and repeat CXR in am. Low threshold to add antibiotics if fever for left basilar airspace disease. PCCM will continue to follow for management.   Cavitary lesion right upper lobe 11 mm History of esophageal cancer with gastric pull-up 2012 Repeat CT chest planned in April 2025   CHB s/p PPM 09/2023 with RV lead dysfunction with symptomatic bradycardia Left axillary vein occlusion  P:  Per EP, plans for RV lead  extraction and replacement of RV lead 1/28.  Programming since changed to unipolar pacing with good capture thus far   Best Practice (right click and "Reselect all SmartList Selections" daily)  Per primary  Labs   CBC: Recent Labs  Lab 11/26/23 1058 11/28/23 1244 11/29/23 0357 11/30/23 0429  WBC 6.2 9.0 6.5 7.0  NEUTROABS 4.5  --   --   --   HGB 11.7* 12.8 11.5* 12.6  HCT 34.7* 38.3 34.1* 36.8  MCV 89.9 90.1 90.7 89.8  PLT 183 202 187 207    Basic Metabolic Panel: Recent Labs  Lab 11/26/23 1058 11/28/23 1244 11/29/23 0357 11/30/23 0429  NA 140 136 134* 138  K 4.7 4.1 3.5 4.0  CL 107 100 102 104  CO2 23 23 24 24   GLUCOSE 161* 138* 127* 118*  BUN 21 7* 7* 9  CREATININE 1.08* 0.84 0.76 0.76  CALCIUM 9.6 9.5 8.9 9.5  MG  --  1.9 2.0 2.0   GFR: Estimated Creatinine Clearance: 45.1 mL/min (by C-G formula based on SCr of 0.76 mg/dL). Recent Labs  Lab 11/26/23 1058 11/28/23 1244 11/29/23 0357 11/30/23 0429  WBC 6.2 9.0 6.5 7.0    Liver Function Tests: Recent Labs  Lab 11/26/23 1058  AST 70*  ALT 60*  ALKPHOS 65  BILITOT 0.8  PROT 7.1  ALBUMIN 3.9   No results for input(s): "LIPASE", "AMYLASE" in the last 168 hours. No results for input(s): "AMMONIA" in the last 168 hours.  ABG    Component Value Date/Time   TCO2 25 02/19/2011 0959     Coagulation Profile: No results for input(s): "INR", "PROTIME" in the last 168 hours.  Cardiac Enzymes: No results for input(s): "CKTOTAL", "CKMB", "CKMBINDEX", "TROPONINI" in the last 168 hours.  HbA1C: Hgb A1c MFr Bld  Date/Time Value Ref Range Status  07/16/2023 12:26 PM 7.1 (H) 4.6 - 6.5 % Final    Comment:    Glycemic Control Guidelines for People with Diabetes:Non Diabetic:  <6%Goal of Therapy: <7%Additional Action Suggested:  >8%   05/27/2023 08:05 AM 6.9 (H) 4.6 - 6.5 % Final    Comment:    Glycemic Control Guidelines for People with Diabetes:Non Diabetic:  <6%Goal of Therapy: <7%Additional Action  Suggested:  >8%     CBG: Recent Labs  Lab 11/28/23 0532 11/29/23 1336 11/29/23 1715 11/30/23 0617  GLUCAP 115* 130* 102* 104*   Marcelle Smiling, MD Board Certified by the ABIM, Pulmonary Diseases & Critical Care Medicine  If no response to pager , please call [phone] until 7 pm After 7:00 pm call Elink  [phone]   11/30/2023, 11:20 AM

## 2023-11-30 NOTE — Progress Notes (Signed)
Mobility Specialist Progress Note;   11/30/23 1053  Mobility  Activity Transferred from bed to chair  Level of Assistance Contact guard assist, steadying assist  Assistive Device Other (Comment) (HHA)  Distance Ambulated (ft) 10 ft  Activity Response Tolerated well  Mobility Referral Yes  Mobility visit 1 Mobility  Mobility Specialist Start Time (ACUTE ONLY) 1053  Mobility Specialist Stop Time (ACUTE ONLY) 1100  Mobility Specialist Time Calculation (min) (ACUTE ONLY) 7 min   Pt agreeable to mobility w/ encouragement. Deferred hallway ambulation at this time however agreed to transfer to chair. Required MinG assistance via HHA to transfer safely. VSS throughout and no c/o when asked. Pt left in chair with all needs met. Visitor in room.   Caesar Bookman Mobility Specialist Please contact via SecureChat or [employer] [phone]

## 2023-12-01 ENCOUNTER — Encounter (HOSPITAL_COMMUNITY): Payer: Self-pay | Admitting: Internal Medicine

## 2023-12-01 ENCOUNTER — Inpatient Hospital Stay (HOSPITAL_COMMUNITY): Payer: Medicare PPO

## 2023-12-01 DIAGNOSIS — J939 Pneumothorax, unspecified: Secondary | ICD-10-CM | POA: Diagnosis not present

## 2023-12-01 DIAGNOSIS — Z4682 Encounter for fitting and adjustment of non-vascular catheter: Secondary | ICD-10-CM | POA: Diagnosis not present

## 2023-12-01 DIAGNOSIS — R918 Other nonspecific abnormal finding of lung field: Secondary | ICD-10-CM | POA: Diagnosis not present

## 2023-12-01 DIAGNOSIS — J90 Pleural effusion, not elsewhere classified: Secondary | ICD-10-CM | POA: Diagnosis not present

## 2023-12-01 DIAGNOSIS — Z950 Presence of cardiac pacemaker: Secondary | ICD-10-CM | POA: Diagnosis not present

## 2023-12-01 DIAGNOSIS — I700 Atherosclerosis of aorta: Secondary | ICD-10-CM | POA: Diagnosis not present

## 2023-12-01 DIAGNOSIS — R0602 Shortness of breath: Secondary | ICD-10-CM | POA: Diagnosis not present

## 2023-12-01 LAB — BASIC METABOLIC PANEL
Anion gap: 9 (ref 5–15)
BUN: 11 mg/dL (ref 8–23)
CO2: 24 mmol/L (ref 22–32)
Calcium: 9 mg/dL (ref 8.9–10.3)
Chloride: 102 mmol/L (ref 98–111)
Creatinine, Ser: 0.7 mg/dL (ref 0.44–1.00)
GFR, Estimated: >60 mL/min (ref >60)
Glucose, Bld: 126 mg/dL — ABNORMAL HIGH (ref 70–99)
Potassium: 3.9 mmol/L (ref 3.5–5.1)
Sodium: 135 mmol/L (ref 135–145)

## 2023-12-01 LAB — CBC
HCT: 34.5 % — ABNORMAL LOW (ref 36.0–46.0)
Hemoglobin: 12.2 g/dL (ref 12.0–15.0)
MCH: 31 pg (ref 26.0–34.0)
MCHC: 35.4 g/dL (ref 30.0–36.0)
MCV: 87.6 fL (ref 80.0–100.0)
Platelets: 182 10*3/uL (ref 150–400)
RBC: 3.94 MIL/uL (ref 3.87–5.11)
RDW: 14.2 % (ref 11.5–15.5)
WBC: 7.4 10*3/uL (ref 4.0–10.5)
nRBC: 0 % (ref 0.0–0.2)

## 2023-12-01 LAB — GLUCOSE, CAPILLARY
Glucose-Capillary: 133 mg/dL — ABNORMAL HIGH (ref 70–99)
Glucose-Capillary: 109 mg/dL — ABNORMAL HIGH (ref 70–99)
Glucose-Capillary: 112 mg/dL — ABNORMAL HIGH (ref 70–99)
Glucose-Capillary: 139 mg/dL — ABNORMAL HIGH (ref 70–99)
Glucose-Capillary: 245 mg/dL — ABNORMAL HIGH (ref 70–99)

## 2023-12-01 LAB — MAGNESIUM: Magnesium: 1.8 mg/dL (ref 1.7–2.4)

## 2023-12-01 MED FILL — Midazolam HCl Inj 2 MG/2ML (Base Equivalent): INTRAMUSCULAR | Qty: 4 | Status: AC

## 2023-12-01 MED FILL — Gentamicin Sulfate Inj 40 MG/ML: INTRAMUSCULAR | Qty: 80 | Status: AC

## 2023-12-01 NOTE — Progress Notes (Signed)
  Chest tube removal delayed.  10:46- Attempted to remove chest tube as ordered, but patient reports MD told her chest tube was to be removed tomorrow. Patient also reports feeling like her breathing is "restricted".   Notified MD, received orders for stat CXR, MD verified chest tube is to be removed today.   12:02: Received order from pulmonary NP to remove chest tube. Charge RN requested assistance from [facility] RN for chest tube removal.

## 2023-12-01 NOTE — Progress Notes (Addendum)
Patient Name: Briana Craig Date of Encounter: 12/01/2023  Primary Cardiologist: None Electrophysiologist: None  Interval Summary   The patient is doing well today.  At this time, the patient denies chest pain, shortness of breath, or any new concerns.  Vital Signs    Vitals:   11/30/23 2332 12/01/23 0300 12/01/23 0730 12/01/23 1053  BP: 111/65 111/70 131/84 (!) 147/98  Pulse: 81 80  95  Resp: 16 18 18    Temp: 97.8 F (36.6 C) (!) 97.5 F (36.4 C) 97.9 F (36.6 C)   TempSrc: Oral Oral Oral   SpO2: 90% 92%    Weight:      Height:        Intake/Output Summary (Last 24 hours) at 12/01/2023 1204 Last data filed at 12/01/2023 0437 Gross per 24 hour  Intake 1234.6 ml  Output 616 ml  Net 618.6 ml   Filed Weights   11/26/23 1042 11/28/23 0352  Weight: 50.8 kg 50.8 kg    Physical Exam    GEN- The patient is well appearing, alert and oriented x 3 today.   Lungs- Clear to ausculation bilaterally, normal work of breathing Cardiac- Regular rate and rhythm, no murmurs, rubs or gallops GI- soft, NT, ND, + BS Extremities- no clubbing or cyanosis. No edema  Telemetry    SR 70's, occasional  VP (personally reviewed)  Device:  MDT Azure XT DR MRI, dual chamber, RA with 45 cm Capsure Novus, RV 5076 52 cm, implanted 11/30/23  Hospital Course    Briana Craig is a 78 y.o. female with PMH of HTN, HLD, esophageal CA s/p chemo / XRT, symptomatic bradycardia with conduction system disease s/p PPM 09/24/23 admitted for RV lead dysfunction with failure to capture and progression of bradycardic symptoms. Admit with CHB with narrow escape at 45 bpm. Attempted lead revision 11/26/23 but unable to pass wire.  Found to have moderate L PTX post LUE venogram. S/p L chest tube. CT transitioned to water seal on 1/28.    Assessment & Plan    RV Lead Dysfunction  Symptomatic Bradycardia / CHB  Admit with suspected lead dislodgement.  S/p revision of device 11/30/23  -CXR post procedure  wnl > CT in good position, lung up, pacemaker / leads in in appropriate position by my read  -tele monitoring  -reviewed arm restrictions with patient  -she does not want to go home today due to CT remaining in place   Occluded R Subclavian  Note venogram from RV lead revision attempt 1/24, axillary vein fills largely by the cephalic vein  -s/p device revision as above    Hypertension  -well controlled on current regimen   Iatrogenic PTX  Hx Cavitary Lesion RUL, 11mm  -per PCCM  -CT on water seal, lung appears up on 1120 film  -defer timing of removal to PCCM   Hx Esophageal CA with Gastric Pull-up 2012 -can not lie flat / has to have 15 degrees    For questions or updates, please contact [facility] Please consult [email or web address] for contact info under Cardiology/STEMI.  Signed, Canary Brim, NP-C, AGACNP-BC [facility] - Electrophysiology  12/01/2023, 12:09 PM  EP Attending  Patient seen and examined. Agree with the findings as noted above. The patient is a little sore but doing well after PPM insertion. No hematoma. CXR demonstrates stable lead position. PPM interrogation under my direction demonstrates normal DDD PM function. She can be discharged home after her chest tube is removed. Call for questions. Outpatient followup  is arranged.  Sharlot Gowda Ark Agrusa,MD

## 2023-12-01 NOTE — Progress Notes (Signed)
NAME:  Briana Craig, MRN:  [id number], DOB:  Dec 23, 1945, LOS: 5 ADMISSION DATE:  11/26/2023, CONSULTATION DATE:  11/27/23 REFERRING MD:  Dr. Nelly Laurence, CHIEF COMPLAINT:  pneumothorax   History of Present Illness:   65 yoF with PMH as below significant for complete heart block s/p PPM 09/24/23, HTN, HLD, GERD, former smoker with hx of esophgeal cancer w/ adjuvant therapies and surgical resection 20212 and prior DCIS of right breast admitted 11/26/23 for malfunctioning pacemaker due to suspected micro-dislodgement of RV lead with symptomatic bradycardia/ CHB w/HR in 40's and 3 week hx of intermittent SOB admitted by EP.   Adjustments made to high output with unipolar mode with better capturing.  Taken 1/24 for lead revision attempted but EP unable to pass wire.  Left upper extremity venogram therefore performed showing occlusion of axillary vein but still unable to pass wire.  Plans for RV lead extraction and replacement of RV lead TBD.  On follow up CXR, pt found to have moderate left sided pneumothorax with small associated left pleural effusion with left basilar collapse.  Pt asymptomatic other than slight pressure in lower neck/ upper chest area and remains hemodynamically stable, PCCM consulted for further management.   Of note, pt also followed by Dr. Delton Coombes in our clinic for RUL cavitary nodule seen on CT in 08/2023, suspected to be related to chronic aspiration/ reflux with inflammatory changes but can not rule out malignancy given hx.  Plans for repeat chest CT in April.    Pertinent  Medical History  Complete heart block s/p PPM, HTN, HLD, esophageal cancer s/p chemo/ XRT Past Medical History:  Diagnosis Date   Breast cancer (HCC) 12/2018   right DCIS   Dysphagia    Esophageal cancer (HCC)    GERD (gastroesophageal reflux disease)    Hyperlipemia    Hypertension    no meds now   Impaired fasting blood sugar    Personal history of radiation therapy    PONV (postoperative nausea and  vomiting)    Pre-diabetes    Significant Hospital Events: Including procedures, antibiotic start and stop dates in addition to other pertinent events   1/24 admitted, attempted PM lead revision 1/25 asymptomatic left PTX 1/27 CXR this am shows no pneumothorax.  Chest tube still to wall suction. 1/28 lead removal/revision today.  Interim History / Subjective:  No chest pain.  Denies nausea.  RV PPM lead replaced yesterday.  Chest tube clamped overnight.  No air leak noted today with deep inspiration or coughing.  CXR shows no pneumothorax.  Objective   Blood pressure 131/84, pulse 80, temperature 97.9 F (36.6 C), temperature source Oral, resp. rate 18, height 5\' 2"  (1.575 m), weight 50.8 kg, SpO2 92%.        Intake/Output Summary (Last 24 hours) at 12/01/2023 0739 Last data filed at 12/01/2023 0437 Gross per 24 hour  Intake 1234.6 ml  Output 616 ml  Net 618.6 ml   Filed Weights   11/26/23 1042 11/28/23 0352  Weight: 50.8 kg 50.8 kg   Examination: General:  Older adult female sitting in bed in NAD Neuro:  alert, oriented, appropriate, MAE CV: paced, dressing to left chest cdi PULM: No accessory muscle use, decreased breath sounds on left.  No air leak with deep breathing or coughing Extremities: warm/dry, no LE edema    Resolved Hospital Problem list    Assessment & Plan:   Left pneumothorax after attempted PPM lead revision Status post pigtail P:  Chest tube out today.  Final disposition at the discretion of the admitting service.  This patient will need to have some plan for monitoring and changing her chest tube dressing over the next 7 days.  Cavitary lesion right upper lobe 11 mm History of esophageal cancer with gastric pull-up 2012 Repeat CT chest planned in April 2025   CHB s/p PPM 09/2023 with RV lead dysfunction with symptomatic bradycardia Left axillary vein occlusion  P:  Per EP, plans for RV lead extraction and replacement of RV lead 1/28.  Programming  since changed to unipolar pacing with good capture thus far   Best Practice (right click and "Reselect all SmartList Selections" daily)  Per primary  Labs   CBC: Recent Labs  Lab 11/26/23 1058 11/28/23 1244 11/29/23 0357 11/30/23 0429 12/01/23 0521  WBC 6.2 9.0 6.5 7.0 7.4  NEUTROABS 4.5  --   --   --   --   HGB 11.7* 12.8 11.5* 12.6 12.2  HCT 34.7* 38.3 34.1* 36.8 34.5*  MCV 89.9 90.1 90.7 89.8 87.6  PLT 183 202 187 207 182    Basic Metabolic Panel: Recent Labs  Lab 11/26/23 1058 11/28/23 1244 11/29/23 0357 11/30/23 0429 12/01/23 0521  NA 140 136 134* 138 135  K 4.7 4.1 3.5 4.0 3.9  CL 107 100 102 104 102  CO2 23 23 24 24 24   GLUCOSE 161* 138* 127* 118* 126*  BUN 21 7* 7* 9 11  CREATININE 1.08* 0.84 0.76 0.76 0.70  CALCIUM 9.6 9.5 8.9 9.5 9.0  MG  --  1.9 2.0 2.0 1.8   GFR: Estimated Creatinine Clearance: 45.1 mL/min (by C-G formula based on SCr of 0.7 mg/dL). Recent Labs  Lab 11/28/23 1244 11/29/23 0357 11/30/23 0429 12/01/23 0521  WBC 9.0 6.5 7.0 7.4    Liver Function Tests: Recent Labs  Lab 11/26/23 1058  AST 70*  ALT 60*  ALKPHOS 65  BILITOT 0.8  PROT 7.1  ALBUMIN 3.9   No results for input(s): "LIPASE", "AMYLASE" in the last 168 hours. No results for input(s): "AMMONIA" in the last 168 hours.  ABG    Component Value Date/Time   TCO2 25 02/19/2011 0959     Coagulation Profile: No results for input(s): "INR", "PROTIME" in the last 168 hours.  Cardiac Enzymes: No results for input(s): "CKTOTAL", "CKMB", "CKMBINDEX", "TROPONINI" in the last 168 hours.  HbA1C: Hgb A1c MFr Bld  Date/Time Value Ref Range Status  07/16/2023 12:26 PM 7.1 (H) 4.6 - 6.5 % Final    Comment:    Glycemic Control Guidelines for People with Diabetes:Non Diabetic:  <6%Goal of Therapy: <7%Additional Action Suggested:  >8%   05/27/2023 08:05 AM 6.9 (H) 4.6 - 6.5 % Final    Comment:    Glycemic Control Guidelines for People with Diabetes:Non Diabetic:  <6%Goal  of Therapy: <7%Additional Action Suggested:  >8%     CBG: Recent Labs  Lab 11/30/23 0617 11/30/23 0737 11/30/23 1148 11/30/23 2055 12/01/23 0258  GLUCAP 104* 103* 106* 168* 133*   Marcelle Smiling, MD Board Certified by the ABIM, Pulmonary Diseases & Critical Care Medicine  If no response to pager , please call [phone] until 7 pm After 7:00 pm call Elink  [phone]   12/01/2023, 8:35 AM

## 2023-12-02 ENCOUNTER — Inpatient Hospital Stay (HOSPITAL_COMMUNITY): Payer: Medicare PPO

## 2023-12-02 DIAGNOSIS — Z950 Presence of cardiac pacemaker: Secondary | ICD-10-CM | POA: Diagnosis not present

## 2023-12-02 DIAGNOSIS — J90 Pleural effusion, not elsewhere classified: Secondary | ICD-10-CM | POA: Diagnosis not present

## 2023-12-02 DIAGNOSIS — Z4682 Encounter for fitting and adjustment of non-vascular catheter: Secondary | ICD-10-CM | POA: Diagnosis not present

## 2023-12-02 DIAGNOSIS — R918 Other nonspecific abnormal finding of lung field: Secondary | ICD-10-CM | POA: Diagnosis not present

## 2023-12-02 LAB — CBC
HCT: 32.5 % — ABNORMAL LOW (ref 36.0–46.0)
Hemoglobin: 11.2 g/dL — ABNORMAL LOW (ref 12.0–15.0)
MCH: 30.5 pg (ref 26.0–34.0)
MCHC: 34.5 g/dL (ref 30.0–36.0)
MCV: 88.6 fL (ref 80.0–100.0)
Platelets: 171 10*3/uL (ref 150–400)
RBC: 3.67 MIL/uL — ABNORMAL LOW (ref 3.87–5.11)
RDW: 14.3 % (ref 11.5–15.5)
WBC: 8.8 10*3/uL (ref 4.0–10.5)
nRBC: 0 % (ref 0.0–0.2)

## 2023-12-02 LAB — BASIC METABOLIC PANEL
Anion gap: 10 (ref 5–15)
BUN: 8 mg/dL (ref 8–23)
CO2: 24 mmol/L (ref 22–32)
Calcium: 9 mg/dL (ref 8.9–10.3)
Chloride: 102 mmol/L (ref 98–111)
Creatinine, Ser: 0.82 mg/dL (ref 0.44–1.00)
GFR, Estimated: >60 mL/min (ref >60)
Glucose, Bld: 125 mg/dL — ABNORMAL HIGH (ref 70–99)
Potassium: 3.8 mmol/L (ref 3.5–5.1)
Sodium: 136 mmol/L (ref 135–145)

## 2023-12-02 LAB — GLUCOSE, CAPILLARY
Glucose-Capillary: 112 mg/dL — ABNORMAL HIGH (ref 70–99)
Glucose-Capillary: 125 mg/dL — ABNORMAL HIGH (ref 70–99)

## 2023-12-02 LAB — MAGNESIUM: Magnesium: 1.8 mg/dL (ref 1.7–2.4)

## 2023-12-02 MED ORDER — NIFEDIPINE ER 30 MG PO TB24: 30.0000 mg | ORAL_TABLET | Freq: Every day | ORAL | 3 refills | Status: DC

## 2023-12-02 NOTE — Care Management Important Message (Signed)
Important Message  Patient Details  Name: Briana Craig MRN: [id number] Date of Birth: [birth year]   Important Message Given:  Yes - Medicare IM     Renie Ora 12/02/2023, 10:58 AM

## 2023-12-02 NOTE — Progress Notes (Signed)
Discharge instructions (including medications) discussed with and copy provided to patient/caregiver

## 2023-12-02 NOTE — TOC Transition Note (Signed)
Transition of Care [facility]) - Discharge Note   Patient Details  Name: Briana Craig MRN: [id number] Date of Birth: 24-Jan-1946  Transition of [facility]) CM/SW Contact:  Gala Lewandowsky, RN Phone Number: 12/02/2023, 10:37 AM   Clinical Narrative: Patient will transition home today and states she will not need any home health at this time. Patient states transportation is in the parking lot. No further needs identified at this time.   Final next level of care: Home/Self Care Barriers to Discharge: No Barriers Identified   Patient Goals and CMS Choice Patient states their goals for this hospitalization and ongoing recovery are:: plan for transition home.   Discharge Plan and Services Additional resources added to the After Visit Summary for   In-house Referral: NA Discharge Planning Services: CM Consult Post Acute Care Choice: Home Health               HH Arranged: NA    Social Drivers of Health (SDOH) Interventions SDOH Screenings   Food Insecurity: No Food Insecurity (11/26/2023)  Housing: Low Risk  (11/26/2023)  Transportation Needs: No Transportation Needs (11/26/2023)  Utilities: Not At Risk (11/26/2023)  Alcohol Screen: Low Risk  (01/11/2023)  Depression (PHQ2-9): Low Risk  (01/11/2023)  Financial Resource Strain: Low Risk  (01/06/2022)  Physical Activity: Sufficiently Active (01/06/2022)  Social Connections: Socially Isolated (11/26/2023)  Stress: No Stress Concern Present (12/19/2020)  Tobacco Use: Medium Risk (11/26/2023)   Readmission Risk Interventions     No data to display

## 2023-12-02 NOTE — Plan of Care (Signed)
Problem: Education: Goal: Knowledge of General Education information will improve Description: Including pain rating scale, medication(s)/side effects and non-pharmacologic comfort measures 12/02/2023 1108 by Coy Saunas, RN Outcome: Adequate for Discharge 12/02/2023 1108 by Coy Saunas, RN Outcome: Adequate for Discharge   Problem: Health Behavior/Discharge Planning: Goal: Ability to manage health-related needs will improve 12/02/2023 1108 by Coy Saunas, RN Outcome: Adequate for Discharge 12/02/2023 1108 by Coy Saunas, RN Outcome: Adequate for Discharge   Problem: Clinical Measurements: Goal: Ability to maintain clinical measurements within normal limits will improve 12/02/2023 1108 by Coy Saunas, RN Outcome: Adequate for Discharge 12/02/2023 1108 by Coy Saunas, RN Outcome: Adequate for Discharge Goal: Will remain free from infection 12/02/2023 1108 by Coy Saunas, RN Outcome: Adequate for Discharge 12/02/2023 1108 by Coy Saunas, RN Outcome: Adequate for Discharge Goal: Diagnostic test results will improve 12/02/2023 1108 by Coy Saunas, RN Outcome: Adequate for Discharge 12/02/2023 1108 by Coy Saunas, RN Outcome: Adequate for Discharge Goal: Respiratory complications will improve 12/02/2023 1108 by Coy Saunas, RN Outcome: Adequate for Discharge 12/02/2023 1108 by Coy Saunas, RN Outcome: Adequate for Discharge Goal: Cardiovascular complication will be avoided 12/02/2023 1108 by Coy Saunas, RN Outcome: Adequate for Discharge 12/02/2023 1108 by Coy Saunas, RN Outcome: Adequate for Discharge   Problem: Activity: Goal: Risk for activity intolerance will decrease 12/02/2023 1108 by Coy Saunas, RN Outcome: Adequate for Discharge 12/02/2023 1108 by Coy Saunas, RN Outcome: Adequate for Discharge   Problem: Nutrition: Goal: Adequate nutrition will be maintained 12/02/2023 1108 by Coy Saunas, RN Outcome: Adequate for  Discharge 12/02/2023 1108 by Coy Saunas, RN Outcome: Adequate for Discharge   Problem: Coping: Goal: Level of anxiety will decrease 12/02/2023 1108 by Coy Saunas, RN Outcome: Adequate for Discharge 12/02/2023 1108 by Coy Saunas, RN Outcome: Adequate for Discharge   Problem: Elimination: Goal: Will not experience complications related to bowel motility 12/02/2023 1108 by Coy Saunas, RN Outcome: Adequate for Discharge 12/02/2023 1108 by Coy Saunas, RN Outcome: Adequate for Discharge Goal: Will not experience complications related to urinary retention 12/02/2023 1108 by Coy Saunas, RN Outcome: Adequate for Discharge 12/02/2023 1108 by Coy Saunas, RN Outcome: Adequate for Discharge   Problem: Pain Managment: Goal: General experience of comfort will improve and/or be controlled 12/02/2023 1108 by Coy Saunas, RN Outcome: Adequate for Discharge 12/02/2023 1108 by Coy Saunas, RN Outcome: Adequate for Discharge   Problem: Safety: Goal: Ability to remain free from injury will improve 12/02/2023 1108 by Coy Saunas, RN Outcome: Adequate for Discharge 12/02/2023 1108 by Coy Saunas, RN Outcome: Adequate for Discharge   Problem: Skin Integrity: Goal: Risk for impaired skin integrity will decrease 12/02/2023 1108 by Coy Saunas, RN Outcome: Adequate for Discharge 12/02/2023 1108 by Coy Saunas, RN Outcome: Adequate for Discharge   Problem: Education: Goal: Knowledge of cardiac device and self-care will improve 12/02/2023 1108 by Coy Saunas, RN Outcome: Adequate for Discharge 12/02/2023 1108 by Coy Saunas, RN Outcome: Adequate for Discharge Goal: Ability to safely manage health related needs after discharge will improve 12/02/2023 1108 by Coy Saunas, RN Outcome: Adequate for Discharge 12/02/2023 1108 by Coy Saunas, RN Outcome: Adequate for Discharge Goal: Individualized Educational Video(s) 12/02/2023 1108 by Coy Saunas,  RN Outcome: Adequate for Discharge 12/02/2023 1108 by Coy Saunas, RN Outcome: Adequate for Discharge   Problem: Cardiac: Goal:  Ability to achieve and maintain adequate cardiopulmonary perfusion will improve 12/02/2023 1108 by Coy Saunas, RN Outcome: Adequate for Discharge 12/02/2023 1108 by Coy Saunas, RN Outcome: Adequate for Discharge

## 2023-12-03 ENCOUNTER — Telehealth: Payer: Self-pay

## 2023-12-03 ENCOUNTER — Encounter (HOSPITAL_COMMUNITY): Payer: Self-pay | Admitting: Cardiology

## 2023-12-03 LAB — TYPE AND SCREEN
ABO/RH(D): A POS
Antibody Screen: NEGATIVE
Unit division: 0
Unit division: 0

## 2023-12-03 LAB — BPAM RBC
Blood Product Expiration Date: 202502232359
Blood Product Expiration Date: 202502232359
Unit Type and Rh: 6200
Unit Type and Rh: 6200

## 2023-12-03 MED ORDER — IOHEXOL 350 MG/ML SOLN
INTRAVENOUS | Status: AC | PRN
  Administered 2023-11-26: 30 mL

## 2023-12-03 NOTE — Transitions of Care (Post Inpatient/ED Visit) (Signed)
   12/03/2023  Name: Briana Craig MRN: [id number] DOB: 09-Jan-1946  Today's TOC FU Call Status: Today's TOC FU Call Status:: Unsuccessful Call (1st Attempt) Unsuccessful Call (1st Attempt) Date: 12/03/23  Attempted to reach the patient regarding the most recent Inpatient/ED visit.  Follow Up Plan: Additional outreach attempts will be made to reach the patient to complete the Transitions of Care (Post Inpatient/ED visit) call.   Wyline Mood BSN, Programmer, systems   Transitions of Care  [place] [facility], [facility] Direct Dial Number: [phone]  Fax: [phone]

## 2023-12-06 ENCOUNTER — Inpatient Hospital Stay: Payer: Medicare PPO | Admitting: Family Medicine

## 2023-12-06 ENCOUNTER — Telehealth: Payer: Self-pay | Admitting: *Deleted

## 2023-12-06 ENCOUNTER — Ambulatory Visit: Payer: Medicare PPO | Admitting: Family Medicine

## 2023-12-06 NOTE — Telephone Encounter (Signed)
Device alert received from [employer]:  Device alert for new AF, longest duration 1hr , controlled rates Burden 2.2%, no OAC per EPIC Route to triage high alert per protocol  Patient with 2 recent lead revision procedures.  Will route to Dr. Nelly Laurence for review.

## 2023-12-06 NOTE — Transitions of Care (Post Inpatient/ED Visit) (Signed)
12/06/2023  Name: Briana Craig MRN: [id number] DOB: 12-12-1945  Today's TOC FU Call Status: Today's TOC FU Call Status:: Successful TOC FU Call Completed TOC FU Call Complete Date: 12/06/23 Patient's Name and Date of Birth confirmed.  Transition Care Management Follow-up Telephone Call Date of Discharge: 12/02/23 Discharge Facility: [facility]) Type of Discharge: Inpatient Admission Primary Inpatient Discharge Diagnosis:: Pacemaker failure, initial encounter How have you been since you were released from the hospital?: Better (a little tired) Any questions or concerns?: No  Items Reviewed: Did you receive and understand the discharge instructions provided?: Yes Medications obtained,verified, and reconciled?: Yes (Medications Reviewed) Any new allergies since your discharge?: No Dietary orders reviewed?: No Do you have support at home?: Yes People in Home: alone Name of Support/Comfort Primary Source: [name]  Medications Reviewed Today: Medications Reviewed Today     Reviewed by Luella Cook, RN (Case Manager) on 12/06/23 at 1549  Med List Status: <None>   Medication Order Taking? Sig Documenting Provider Last Dose Status Informant  Accu-Chek Softclix Lancets lancets [id number] Yes Use to check blood sugar once a day.  DX  E11.9 Donato Schultz, DO Taking Active Self, Pharmacy Records  Alcohol Swabs (ALCOHOL WIPES) 70 % PADS [id number] Yes To use to check sugars Zola Button, Grayling Congress, DO Taking Active Self, Pharmacy Records  Blood Glucose Calibration (ACCU-CHEK AVIVA) SOLN [id number] Yes Use to check control when opening a new bottle of strips.  DX E11.9 Donato Schultz, DO Taking Active Self, Pharmacy Records  Blood Glucose Monitoring Suppl (ACCU-CHEK AVIVA PLUS) w/Device KIT [id number] Yes Use to check blood sugar once a day.  DX E11.9 Donato Schultz, DO Taking Active Self, Pharmacy Records  Blood Glucose Monitoring Suppl (TRUE METRIX AIR GLUCOSE  METER) DEVI [id number] Yes Use as directed to check glucose twice daily. E11.9 Donato Schultz, DO Taking Active Self, Pharmacy Records  cetirizine (ZYRTEC) 10 MG tablet [id number] Yes Take 10 mg by mouth daily as needed for allergies. [provider] Taking Active Self, Pharmacy Records  Coenzyme Q10 (CO Q 10 PO) [id number] Yes Take 1 capsule by mouth at bedtime. Gel capsule [provider] Taking Active Self, Pharmacy Records  famotidine (PEPCID) 40 MG tablet [id number] Yes Take 1 tablet (40 mg total) by mouth at bedtime. Donato Schultz, DO Taking Active Self, Pharmacy Records  Glucos-Chondroit-Hyaluron-MSM [facility] CHONDROITIN JOINT PO) [id number] Yes Take 2,000 mg by mouth daily. [provider] Taking Active Self, Pharmacy Records  glucose blood (ACCU-CHEK AVIVA PLUS) test strip [id number] Yes Use to check blood sugar once daily.  DX  E 11.9 Zola Button, Yvonne R, DO Taking Active Self, Pharmacy Records  latanoprost (XALATAN) 0.005 % ophthalmic solution [id number] Yes Place 1 drop into both eyes at bedtime. [provider] Taking Active Self, Pharmacy Records  Multiple Vitamins-Minerals (MULTIVITAMIN PO) [id number] Yes Take 1 tablet by mouth daily. Centrum silver 50+ [provider] Taking Active Self, Pharmacy Records  Multiple Vitamins-Minerals (PRESERVISION AREDS) CAPS [id number] Yes Take 1 capsule by mouth 2 (two) times daily. [provider] Taking Active Self, Pharmacy Records  NIFEdipine (ADALAT CC) 30 MG 24 hr tablet [id number] Yes Take 1 tablet (30 mg total) by mouth daily. Jeanella Craze, NP Taking Active   Omega-3 Fatty Acids (FISH OIL PO) [id number] Yes Take 1,200 mg by mouth daily. [facility] [provider] Taking Active Self, Pharmacy Records  pantoprazole (PROTONIX) 40 MG tablet [id number] Yes Take 1  tablet (40 mg total) by mouth daily. Donato Schultz, DO Taking Active Self, Pharmacy Records  simvastatin (ZOCOR)  20 MG tablet [id number] Yes Take 1 tablet (20 mg total) by mouth daily at 6 PM. Zola Button, Grayling Congress, DO Taking Active Self, Pharmacy Records  TRUEplus Lancets 33G MISC [id number] Yes Use as directed. Check glucose up to twice daily. E11.9 Donato Schultz, DO Taking Active Self, Pharmacy Records            Home Care and Equipment/Supplies: Were Home Health Services Ordered?: NA Any new equipment or medical supplies ordered?: NA  Functional Questionnaire: Do you need assistance with bathing/showering or dressing?: No Do you need assistance with meal preparation?: No Do you need assistance with eating?: No Do you have difficulty maintaining continence: No Do you need assistance with getting out of bed/getting out of a chair/moving?: No Do you have difficulty managing or taking your medications?: No  Follow up appointments reviewed: PCP Follow-up appointment confirmed?: Yes Date of PCP follow-up appointment?: 12/07/23 Follow-up Provider: Florina Ou [facility] Follow-up appointment confirmed?: Yes Date of Specialist follow-up appointment?: 12/08/23 Follow-Up Specialty Provider:: Waneta Martins NP Do you need transportation to your follow-up appointment?: No Do you understand care options if your condition(s) worsen?: Yes-patient verbalized understanding  SDOH Interventions Today    Flowsheet Row Most Recent Value  SDOH Interventions   Food Insecurity Interventions Intervention Not Indicated  Housing Interventions Intervention Not Indicated  Transportation Interventions Intervention Not Indicated, Patient Resources (Friends/Family)  Utilities Interventions Intervention Not Indicated      Interventions Today    Flowsheet Row Most Recent Value  Chronic Disease   Chronic disease during today's visit Other  [Pacemaker failure, initial encounter]  General Interventions   General Interventions Discussed/Reviewed General Interventions Discussed, General  Interventions Reviewed, Doctor Visits  Doctor Visits Discussed/Reviewed Doctor Visits Discussed, Doctor Visits Reviewed  Pharmacy Interventions   Pharmacy Dicussed/Reviewed Pharmacy Topics Discussed, Pharmacy Topics Reviewed        Gean Maidens BSN RN [facility] Health Care Management Coordinator [email or web address] Direct Dial: [phone]  Fax: [phone] Website: [email or web address]

## 2023-12-07 ENCOUNTER — Encounter: Payer: Self-pay | Admitting: Family Medicine

## 2023-12-07 ENCOUNTER — Ambulatory Visit (INDEPENDENT_AMBULATORY_CARE_PROVIDER_SITE_OTHER): Payer: Medicare PPO | Admitting: Family Medicine

## 2023-12-07 VITALS — BP 130/84 | HR 68 | Temp 97.7°F | Resp 16 | Ht 62.0 in | Wt 114.4 lb

## 2023-12-07 DIAGNOSIS — E1169 Type 2 diabetes mellitus with other specified complication: Secondary | ICD-10-CM

## 2023-12-07 DIAGNOSIS — H353231 Exudative age-related macular degeneration, bilateral, with active choroidal neovascularization: Secondary | ICD-10-CM | POA: Diagnosis not present

## 2023-12-07 DIAGNOSIS — E1165 Type 2 diabetes mellitus with hyperglycemia: Secondary | ICD-10-CM | POA: Diagnosis not present

## 2023-12-07 DIAGNOSIS — E785 Hyperlipidemia, unspecified: Secondary | ICD-10-CM

## 2023-12-07 MED ORDER — BLOOD GLUCOSE TEST VI STRP: 1.0000 | ORAL_STRIP | Freq: Three times a day (TID) | 0 refills | Status: AC

## 2023-12-07 MED ORDER — LANCET DEVICE MISC: 1.0000 | Freq: Three times a day (TID) | 0 refills | Status: AC

## 2023-12-07 MED ORDER — LANCETS MISC. MISC: 1.0000 | Freq: Three times a day (TID) | 0 refills | Status: AC

## 2023-12-07 MED ORDER — BLOOD GLUCOSE MONITORING SUPPL DEVI: 1.0000 | Freq: Three times a day (TID) | 0 refills | Status: AC

## 2023-12-07 NOTE — Progress Notes (Signed)
 Established Patient Office Visit  Subjective   Patient ID: Briana Craig, female    DOB: Oct 06, 1946  Age: 78 y.o. MRN: [id number]  Chief Complaint  Patient presents with   Hospitalization Follow-up    Pacemaker failure    HPI Discussed the use of AI scribe software for clinical note transcription with the patient, who gave verbal consent to proceed.  History of Present Illness   Briana Craig is a 78 year old female who presents with complications following a recent pacemaker revision.  She underwent a pacemaker placement on November 22nd. Recently, she began feeling worse and experienced difficulty walking across a room. It was determined that the pacemaker lead had come out, necessitating a new pacemaker. During the procedure to replace the pacemaker, her lung was punctured, resulting in a pneumothorax. A chest tube was placed to drain the pneumothorax, which was a painful experience for her. She cried during the procedure due to inadequate pain management.  Following the procedure, she has been experiencing irritation from the SteriStrips used to secure the incision site. She is concerned about potential skin damage from prolonged use of the tape. She has a lack of appetite during her hospital stay, consuming very little food, which has contributed to her feeling weak. She mentions eating orange sherbet as one of the few things she could tolerate.  Regarding her diabetes management, she has not been checking her blood sugars regularly and has had difficulty using her glucose meter. Her last A1c was 7.1%. She continues to watch her diet, avoiding pastries.      Patient Active Problem List   Diagnosis Date Noted   Pacemaker failure, initial encounter 11/26/2023   Abnormal CT of the chest 10/12/2023   SOB (shortness of breath) 07/16/2023   Palpitation 07/16/2023   Wrist pain, acute, right 06/03/2023   Age-related osteoporosis without current pathological fracture 11/27/2021    Preventative health care 05/27/2021   Periorbital cellulitis of right eye 09/11/2019   Diet-controlled diabetes mellitus (HCC) 05/22/2019   Ductal carcinoma in situ (DCIS) of right breast 05/22/2019   Nipple lesion 11/21/2018   Controlled type 2 diabetes mellitus without complication, without long-term current use of insulin (HCC) 05/19/2018   Hyperlipidemia associated with type 2 diabetes mellitus (HCC) 05/19/2018   Vitamin D  deficiency 05/19/2018   History of esophageal cancer 05/19/2018   Hematuria 04/08/2015   UTI (urinary tract infection) 04/04/2015   DM (diabetes mellitus) type II uncontrolled, periph vascular disorder 12/06/2014   Dysuria 01/25/2014   IFG (impaired fasting glucose) 01/25/2014   Vitamin D  deficiency 05/30/2013   Encounter for therapeutic drug monitoring 05/30/2013   Impaired fasting glucose 05/30/2013   Other malaise and fatigue 05/30/2013   Hyperlipidemia 05/30/2013   GERD (gastroesophageal reflux disease) 05/30/2013   Need for prophylactic vaccination with combined diphtheria-tetanus-pertussis (DTP) vaccine 05/30/2013   Esophageal cancer (HCC)    Dysphagia 11/17/2011   Cancer of esophagus (HCC) 11/13/2011   Past Medical History:  Diagnosis Date   Breast cancer (HCC) 12/2018   right DCIS   Dysphagia    Esophageal cancer (HCC)    GERD (gastroesophageal reflux disease)    Hyperlipemia    Hypertension    no meds now   Impaired fasting blood sugar    Personal history of radiation therapy    PONV (postoperative nausea and vomiting)    Pre-diabetes    Past Surgical History:  Procedure Laterality Date   BREAST BIOPSY Right 12/23/2018   BREAST LUMPECTOMY Right 01/18/2019  BREAST LUMPECTOMY WITH RADIOACTIVE SEED LOCALIZATION Right 01/18/2019   Procedure: RIGHT BREAST CENTRAL LUMPECTOMY WITH RADIOACTIVE SEED LOCALIZATION;  Surgeon: Ebbie Cough, MD;  Location: [facility];  Service: General;  Laterality: Right;   DIRECT LARYNGOSCOPY  WITH BOTOX INJECTION     tx paralysed vocal cord   ESOPHAGOGASTRODUODENOSCOPY  11/17/2011   Procedure: ESOPHAGOGASTRODUODENOSCOPY (EGD);  Surgeon: Toribio Cedar, MD;  Location: [facility];  Service: Endoscopy;  Laterality: N/A;   ESOPHAGUS SURGERY     EYE SURGERY     both cataracts   GANGLION CYST EXCISION Left 01/26/2013   Procedure: LEFT FLEXOR CARPI RADIALIS  RELEASE AND SCAPHO TRAPEZIAL TRAPEZOID DEBRIDEMENT ;  Surgeon: Franky JONELLE Curia, MD;  Location: [facility];  Service: Orthopedics;  Laterality: Left;   LEAD REVISION/REPAIR N/A 11/30/2023   Procedure: LEAD REVISION/REPAIR;  Surgeon: Waddell Danelle ORN, MD;  Location: [facility];  Service: Cardiovascular;  Laterality: N/A;   LEAD REVISION/REPAIR N/A 11/26/2023   Procedure: LEAD REVISION/REPAIR;  Surgeon: Inocencio Soyla Lunger, MD;  Location: [facility];  Service: Cardiovascular;  Laterality: N/A;   PACEMAKER IMPLANT N/A 09/24/2023   Procedure: PACEMAKER IMPLANT;  Surgeon: Nancey Eulas BRAVO, MD;  Location: [facility];  Service: Cardiovascular;  Laterality: N/A;   Social History   Tobacco Use   Smoking status: Former    Current packs/day: 0.00    Types: Cigarettes    Quit date: 08/30/1987    Years since quitting: 36.2   Smokeless tobacco: Never  Substance Use Topics   Alcohol  use: No   Drug use: Never   Social History   Socioeconomic History   Marital status: Widowed    Spouse name: Christopher   Number of children: 0   Years of education: 18   Highest education level: Not on file  Occupational History   Occupation: RETIRED     Employer: OTHER  Tobacco Use   Smoking status: Former    Current packs/day: 0.00    Types: Cigarettes    Quit date: 08/30/1987    Years since quitting: 36.2   Smokeless tobacco: Never  Substance and Sexual Activity   Alcohol  use: No   Drug use: Never   Sexual activity: Yes    Partners: Male    Birth control/protection: Post-menopausal  Other Topics Concern    Not on file  Social History Narrative   Marital Status:  Married Astrid)   Children:  None    Pets: Dog (1)    Living Situation: Lives with husband.   Occupation:  Retired Loss Adjuster, Chart[employer])    Education: Master's Degree    Tobacco Use/Exposure:  Formal Smoker    Alcohol  Use:  Occasional   Drug Use:  None   Diet:  Regular   Exercise:  Walking 1 mile 5 times per week.    Hobbies:  Reading                Social Drivers of Health   Financial Resource Strain: Low Risk  (01/06/2022)   Overall Financial Resource Strain (CARDIA)    Difficulty of Paying Living Expenses: Not hard at all  Food Insecurity: No Food Insecurity (12/06/2023)   Hunger Vital Sign    Worried About Running Out of Food in the Last Year: Never true    Ran Out of Food in the Last Year: Never true  Transportation Needs: No Transportation Needs (12/06/2023)   PRAPARE - Administrator, Civil Service (Medical): No  Lack of Transportation (Non-Medical): No  Physical Activity: Sufficiently Active (01/06/2022)   Exercise Vital Sign    Days of Exercise per Week: 7 days    Minutes of Exercise per Session: 60 min  Stress: No Stress Concern Present (12/19/2020)   [employer] of Occupational Health - Occupational Stress Questionnaire    Feeling of Stress : Not at all  Social Connections: Socially Isolated (11/26/2023)   Social Connection and Isolation Panel [NHANES]    Frequency of Communication with Friends and Family: More than three times a week    Frequency of Social Gatherings with Friends and Family: More than three times a week    Attends Religious Services: Never    Database Administrator or Organizations: No    Attends Banker Meetings: Never    Marital Status: Widowed  Intimate Partner Violence: Not At Risk (12/06/2023)   Humiliation, Afraid, Rape, and Kick questionnaire    Fear of Current or Ex-Partner: No    Emotionally Abused: No    Physically Abused: No    Sexually Abused: No   Family  Status  Relation Name Status   Sister  Alive   Mother  Deceased   Father  Deceased   Neg Hx  (Not Specified)  No partnership data on file   Family History  Problem Relation Age of Onset   Alcohol  abuse Sister    AAA (abdominal aortic aneurysm) Mother    COPD Father    Colon cancer Neg Hx    Allergies  Allergen Reactions   Taxol [Paclitaxel] Hives and Itching   Tramadol  Other (See Comments)    Sweaty, shaky and faint   Carboplatin Hives and Itching   Codeine Other (See Comments)    Could not sleep, Shaky and Sweaty   Compazine  [Prochlorperazine Maleate] Other (See Comments)    Severe chest pain   Penicillins Other (See Comments)    Childhood   Prochlorperazine Edisylate Other (See Comments)    Severe chest pain      Review of Systems  Constitutional:  Negative for chills, fever and malaise/fatigue.  HENT:  Negative for congestion and hearing loss.   Eyes:  Negative for blurred vision and discharge.  Respiratory:  Negative for cough, sputum production and shortness of breath.   Cardiovascular:  Negative for chest pain, palpitations and leg swelling.  Gastrointestinal:  Negative for abdominal pain, blood in stool, constipation, diarrhea, heartburn, nausea and vomiting.  Genitourinary:  Negative for dysuria, frequency, hematuria and urgency.  Musculoskeletal:  Negative for back pain, falls and myalgias.  Skin:  Negative for rash.  Neurological:  Negative for dizziness, sensory change, loss of consciousness, weakness and headaches.  Endo/Heme/Allergies:  Negative for environmental allergies. Does not bruise/bleed easily.  Psychiatric/Behavioral:  Negative for depression and suicidal ideas. The patient is not nervous/anxious and does not have insomnia.       Objective:     BP 130/84 (BP Location: Left Arm, Patient Position: Sitting, Cuff Size: Normal)   Pulse 68   Temp 97.7 F (36.5 C) (Oral)   Resp 16   Ht 5' 2 (1.575 m)   Wt 114 lb 6.4 oz (51.9 kg)   BMI  20.92 kg/m  BP Readings from Last 3 Encounters:  12/07/23 130/84  12/02/23 122/87  10/12/23 (!) 150/88   Wt Readings from Last 3 Encounters:  12/07/23 114 lb 6.4 oz (51.9 kg)  11/28/23 112 lb 1.6 oz (50.8 kg)  10/12/23 116 lb (52.6 kg)  SpO2 Readings from Last 3 Encounters:  12/02/23 93%  10/12/23 93%  09/24/23 93%      Physical Exam Vitals and nursing note reviewed.  Constitutional:      General: She is not in acute distress.    Appearance: Normal appearance. She is well-developed.  HENT:     Head: Normocephalic and atraumatic.  Eyes:     General: No scleral icterus.       Right eye: No discharge.        Left eye: No discharge.  Cardiovascular:     Rate and Rhythm: Normal rate and regular rhythm.     Heart sounds: No murmur heard. Pulmonary:     Effort: Pulmonary effort is normal. No respiratory distress.     Breath sounds: Normal breath sounds.  Musculoskeletal:        General: Normal range of motion.     Cervical back: Normal range of motion and neck supple.     Right lower leg: No edema.     Left lower leg: No edema.  Skin:    General: Skin is warm and dry.  Neurological:     Mental Status: She is alert and oriented to person, place, and time.  Psychiatric:        Mood and Affect: Mood normal.        Behavior: Behavior normal.        Thought Content: Thought content normal.        Judgment: Judgment normal.      No results found for any visits on 12/07/23.  Last CBC Lab Results  Component Value Date   WBC 8.8 12/02/2023   HGB 11.2 (L) 12/02/2023   HCT 32.5 (L) 12/02/2023   MCV 88.6 12/02/2023   MCH 30.5 12/02/2023   RDW 14.3 12/02/2023   PLT 171 12/02/2023   Last metabolic panel Lab Results  Component Value Date   GLUCOSE 125 (H) 12/02/2023   NA 136 12/02/2023   K 3.8 12/02/2023   CL 102 12/02/2023   CO2 24 12/02/2023   BUN 8 12/02/2023   CREATININE 0.82 12/02/2023   GFRNONAA >60 12/02/2023   CALCIUM 9.0 12/02/2023   PROT 7.1  11/26/2023   ALBUMIN 3.9 11/26/2023   BILITOT 0.8 11/26/2023   ALKPHOS 65 11/26/2023   AST 70 (H) 11/26/2023   ALT 60 (H) 11/26/2023   ANIONGAP 10 12/02/2023   Last lipids Lab Results  Component Value Date   CHOL 193 05/27/2023   HDL 70.00 05/27/2023   LDLCALC 93 05/27/2023   TRIG 150.0 (H) 05/27/2023   CHOLHDL 3 05/27/2023   Last hemoglobin A1c Lab Results  Component Value Date   HGBA1C 7.1 (H) 07/16/2023   Last thyroid  functions Lab Results  Component Value Date   TSH 1.66 07/16/2023   Last vitamin D  Lab Results  Component Value Date   VD25OH 66.43 11/27/2021   Last vitamin B12 and Folate Lab Results  Component Value Date   VITAMINB12 1,342 (H) 07/16/2023      The 10-year ASCVD risk score (Arnett DK, et al., 2019) is: 54.1%    Assessment & Plan:   Problem List Items Addressed This Visit       Unprioritized   Hyperlipidemia associated with type 2 diabetes mellitus (HCC) - Primary   Relevant Orders   Lipid panel   CBC with Differential/Platelet   Comprehensive metabolic panel   Hemoglobin A1c   Microalbumin / creatinine urine ratio   Other Visit Diagnoses  Type 2 diabetes mellitus with hyperglycemia, without long-term current use of insulin (HCC)       Relevant Medications   Blood Glucose Monitoring Suppl DEVI   Glucose Blood (BLOOD GLUCOSE TEST STRIPS) STRP   Lancet Device MISC   Lancets Misc. MISC   Other Relevant Orders   Lipid panel   CBC with Differential/Platelet   Comprehensive metabolic panel   Hemoglobin A1c   Microalbumin / creatinine urine ratio     Assessment and Plan    Pacemaker Lead Displacement Post-pacemaker placement on November 22nd, symptoms worsened due to lead displacement, necessitating replacement by Dr. Buddie. A pneumothorax occurred during the procedure, requiring chest tube insertion. Significant pain and emotional distress were reported. Recovery is ongoing, with Dr. Buddie noting such complications are rare  (1%). Monitor pacemaker function, provide emotional support and counseling, and ensure proper wound care with follow-up for infection or complications.  Pneumothorax This complication arose from the pacemaker lead replacement and was managed with chest tube insertion. Significant pain and emotional distress were noted, with inadequate pain management. Monitor for recurrent pneumothorax and ensure proper wound care with follow-up for infection or complications.  Diabetes Mellitus Type 2 A recent A1c of 7.1% indicates suboptimal control. There is difficulty using the current glucometer, with a preference for a simpler device. Prescribe a simple glucometer from [facility], encourage regular blood glucose monitoring, schedule follow-up blood work in March, and provide dietary counseling.  General Health Maintenance Routine blood work is due. Proper wound care and showering post-procedure were advised. Schedule routine blood work in March, provide wound care and showering instructions, and encourage regular follow-up appointments.  Follow-up Schedule a follow-up appointment next month. Ensure blood work is done the week before the next appointment and advise to call if any issues arise.        No follow-ups on file.    Reilly Molchan R Lowne Chase, DO

## 2023-12-08 ENCOUNTER — Ambulatory Visit: Payer: Medicare PPO | Admitting: Pulmonary Disease

## 2023-12-09 ENCOUNTER — Ambulatory Visit: Payer: Medicare PPO | Attending: Internal Medicine

## 2023-12-09 ENCOUNTER — Telehealth: Payer: Self-pay

## 2023-12-09 DIAGNOSIS — I459 Conduction disorder, unspecified: Secondary | ICD-10-CM

## 2023-12-09 LAB — CUP PACEART INCLINIC DEVICE CHECK
Battery Remaining Longevity: 163 mo
Battery Voltage: 3.23 V
Brady Statistic AP VP Percent: 1.48 %
Brady Statistic AP VS Percent: 0.19 %
Brady Statistic AS VP Percent: 31.92 %
Brady Statistic AS VS Percent: 66.4 %
Brady Statistic RA Percent Paced: 1.76 %
Brady Statistic RV Percent Paced: 34.02 %
Date Time Interrogation Session: 20250206165919
Implantable Lead Connection Status: 753985
Implantable Lead Connection Status: 753985
Implantable Lead Implant Date: 20250128
Implantable Lead Implant Date: 20250128
Implantable Lead Location: 753859
Implantable Lead Location: 753860
Implantable Lead Model: 5076
Implantable Lead Model: 5076
Implantable Pulse Generator Implant Date: 20250128
Lead Channel Impedance Value: 342 Ohm
Lead Channel Impedance Value: 418 Ohm
Lead Channel Impedance Value: 494 Ohm
Lead Channel Impedance Value: 532 Ohm
Lead Channel Pacing Threshold Amplitude: 0.625 V
Lead Channel Pacing Threshold Amplitude: 0.875 V
Lead Channel Pacing Threshold Pulse Width: 0.4 ms
Lead Channel Pacing Threshold Pulse Width: 0.4 ms
Lead Channel Sensing Intrinsic Amplitude: 1 mV
Lead Channel Sensing Intrinsic Amplitude: 1.5 mV
Lead Channel Sensing Intrinsic Amplitude: 12.625 mV
Lead Channel Sensing Intrinsic Amplitude: 17.75 mV
Lead Channel Setting Pacing Amplitude: 3.5 V
Lead Channel Setting Pacing Amplitude: 3.5 V
Lead Channel Setting Pacing Pulse Width: 0.4 ms
Lead Channel Setting Sensing Sensitivity: 1.2 mV
Zone Setting Status: 755011

## 2023-12-09 NOTE — Progress Notes (Signed)
 Normal Pacemaker wound check. Wound well healed. Thresholds, sensing, and impedances consistent with implant measurements and at 3.5V safety margin/auto capture until 3 month visit. More episodes of atrial fibrillation since alert received on December 05, 2023. Reviewed arm restrictions to continue for 6 weeks total post op.  Pt enrolled in remote follow-up. Will forward to physician for review.  Pt not currently taking OAC.

## 2023-12-09 NOTE — Patient Instructions (Signed)

## 2023-12-09 NOTE — Telephone Encounter (Signed)
 Pt seen for wound check.  New afib episodes noted.  Pt upon presenting appeared to be in a slow atrial tachycardia.  By end of device check she was in NSR.  Will send to device triage and Dr. Arlester Ladd for review and advisement.

## 2023-12-14 ENCOUNTER — Ambulatory Visit: Payer: Medicare PPO | Attending: Cardiology

## 2023-12-14 ENCOUNTER — Encounter: Payer: Self-pay | Admitting: Cardiovascular Disease

## 2023-12-14 ENCOUNTER — Telehealth: Payer: Self-pay | Admitting: Cardiovascular Disease

## 2023-12-14 DIAGNOSIS — I459 Conduction disorder, unspecified: Secondary | ICD-10-CM | POA: Insufficient documentation

## 2023-12-14 NOTE — Telephone Encounter (Signed)
Pt called in stating that she thinks she has a stitch sticking out of wound and is unable to send a picture so pt is coming into office for a wound check

## 2023-12-14 NOTE — Progress Notes (Signed)
Saw pt in clinic. No stitch visible. Surgical site C/D/I approximated and well healed. Slightly edematous. Normal color. No signs of infection. Pt educated about pain control using PRN tylenol on a more regular schedule for a few days. No concerns at this time.

## 2023-12-14 NOTE — Telephone Encounter (Signed)
Pt called in asking to speak with Boneta Lucks, RN if he is available. She states her wound is still bothering her.

## 2023-12-14 NOTE — Telephone Encounter (Signed)
Patient in today to Device Clinic at 4pm to evaluate.

## 2023-12-15 ENCOUNTER — Ambulatory Visit: Payer: Medicare PPO

## 2023-12-15 NOTE — Telephone Encounter (Signed)
Reviewed with Dr. Nelly Laurence.  Continue to monitor.  No OAC indicated at this time d/t length of episodes.

## 2023-12-24 ENCOUNTER — Encounter: Payer: Medicare PPO | Admitting: Cardiovascular Disease

## 2023-12-27 ENCOUNTER — Ambulatory Visit: Payer: Medicare PPO | Admitting: Cardiovascular Disease

## 2024-01-04 ENCOUNTER — Other Ambulatory Visit (INDEPENDENT_AMBULATORY_CARE_PROVIDER_SITE_OTHER): Payer: Medicare PPO

## 2024-01-04 DIAGNOSIS — E785 Hyperlipidemia, unspecified: Secondary | ICD-10-CM | POA: Diagnosis not present

## 2024-01-04 DIAGNOSIS — E119 Type 2 diabetes mellitus without complications: Secondary | ICD-10-CM

## 2024-01-04 DIAGNOSIS — H353221 Exudative age-related macular degeneration, left eye, with active choroidal neovascularization: Secondary | ICD-10-CM | POA: Diagnosis not present

## 2024-01-04 DIAGNOSIS — E1169 Type 2 diabetes mellitus with other specified complication: Secondary | ICD-10-CM

## 2024-01-04 LAB — CBC WITH DIFFERENTIAL/PLATELET
Basophils Absolute: 0 10*3/uL (ref 0.0–0.1)
Basophils Relative: 0.3 % (ref 0.0–3.0)
Eosinophils Absolute: 0.3 10*3/uL (ref 0.0–0.7)
Eosinophils Relative: 4.9 % (ref 0.0–5.0)
HCT: 38.5 % (ref 36.0–46.0)
Hemoglobin: 12.5 g/dL (ref 12.0–15.0)
Lymphocytes Relative: 31.7 % (ref 12.0–46.0)
Lymphs Abs: 2.2 10*3/uL (ref 0.7–4.0)
MCHC: 32.5 g/dL (ref 30.0–36.0)
MCV: 93.2 fl (ref 78.0–100.0)
Monocytes Absolute: 0.8 10*3/uL (ref 0.1–1.0)
Monocytes Relative: 11.9 % (ref 3.0–12.0)
Neutro Abs: 3.6 10*3/uL (ref 1.4–7.7)
Neutrophils Relative %: 51.2 % (ref 43.0–77.0)
Platelets: 255 10*3/uL (ref 150.0–400.0)
RBC: 4.13 Mil/uL (ref 3.87–5.11)
RDW: 14.7 % (ref 11.5–15.5)
WBC: 7.1 10*3/uL (ref 4.0–10.5)

## 2024-01-04 LAB — COMPREHENSIVE METABOLIC PANEL
ALT: 14 U/L (ref 0–35)
AST: 21 U/L (ref 0–37)
Albumin: 4.2 g/dL (ref 3.5–5.2)
Alkaline Phosphatase: 62 U/L (ref 39–117)
BUN: 17 mg/dL (ref 6–23)
CO2: 27 meq/L (ref 19–32)
Calcium: 9.8 mg/dL (ref 8.4–10.5)
Chloride: 102 meq/L (ref 96–112)
Creatinine, Ser: 1 mg/dL (ref 0.40–1.20)
GFR: 53.69 mL/min — ABNORMAL LOW (ref >60.00)
Glucose, Bld: 156 mg/dL — ABNORMAL HIGH (ref 70–99)
Potassium: 4 meq/L (ref 3.5–5.1)
Sodium: 139 meq/L (ref 135–145)
Total Bilirubin: 0.6 mg/dL (ref 0.2–1.2)
Total Protein: 7.3 g/dL (ref 6.0–8.3)

## 2024-01-04 LAB — LIPID PANEL
Cholesterol: 178 mg/dL (ref 0–200)
HDL: 78.1 mg/dL (ref >39.00)
LDL Cholesterol: 77 mg/dL (ref 0–99)
NonHDL: 100.03
Total CHOL/HDL Ratio: 2
Triglycerides: 114 mg/dL (ref 0.0–149.0)
VLDL: 22.8 mg/dL (ref 0.0–40.0)

## 2024-01-04 LAB — HEMOGLOBIN A1C: Hgb A1c MFr Bld: 6.9 % — ABNORMAL HIGH (ref 4.6–6.5)

## 2024-01-04 NOTE — Progress Notes (Unsigned)
.  basi

## 2024-01-06 ENCOUNTER — Other Ambulatory Visit: Payer: Self-pay | Admitting: Family Medicine

## 2024-01-06 DIAGNOSIS — K219 Gastro-esophageal reflux disease without esophagitis: Secondary | ICD-10-CM

## 2024-01-10 ENCOUNTER — Ambulatory Visit: Attending: Cardiovascular Disease

## 2024-01-10 ENCOUNTER — Encounter: Payer: Self-pay | Admitting: Family Medicine

## 2024-01-10 ENCOUNTER — Telehealth: Payer: Self-pay

## 2024-01-10 DIAGNOSIS — R002 Palpitations: Secondary | ICD-10-CM

## 2024-01-10 DIAGNOSIS — Z950 Presence of cardiac pacemaker: Secondary | ICD-10-CM

## 2024-01-10 LAB — CUP PACEART INCLINIC DEVICE CHECK
Date Time Interrogation Session: 20250310193142
Implantable Lead Connection Status: 753985
Implantable Lead Connection Status: 753985
Implantable Lead Implant Date: 20250128
Implantable Lead Implant Date: 20250128
Implantable Lead Location: 753859
Implantable Lead Location: 753860
Implantable Lead Model: 5076
Implantable Lead Model: 5076
Implantable Pulse Generator Implant Date: 20250128

## 2024-01-10 MED ORDER — CARVEDILOL 12.5 MG PO TABS: 12.5000 mg | ORAL_TABLET | Freq: Two times a day (BID) | ORAL | 2 refills | Status: DC

## 2024-01-10 NOTE — Patient Instructions (Addendum)
 Your wound site is healing; however, we did remove 1 small stitch from the area today which left a superficial open area.   Wash the area twice daily with Dial antibacterial soap and clean wash cloth, rinsing well and leave open to air.    We will recheck in 1 week: on Tuesday 01/18/2024 at 11am.   If any signs of drainage or infection develop, call our device clinic at [phone].   Your heart rates are running elevated.  Stop the Nifedipine and start on Carvedilol 12.5mg  twice daily and keep a check on your blood pressures.  Call if any concerns.   Thank you.

## 2024-01-10 NOTE — Progress Notes (Signed)
 Patient in today after noting what felt like "a wire" at incision line.  Wound site does present with small stitch to proximal end of incision line.  Area cleansed and stitch removed, leaving 1 small superficial opening.  There are no signs of infection or drainage; otherwise wound healed well with wound edges well approximated.  She is to [facility], cleanse area twice daily with Dial soap/water and clean washcloth and come back in 1 week for recheck.  She has device clinic number if any concerns.   Before leaving, patient mentions that she has been feeling constant fast heart rates and has difficulty when going to sleep.  Feels like her "body is still going".  Device interrogation revealed elevated rates on histograms and frequent periods of AT with short runs of AF.  Overall AF burden is 0.6% and not clinically significant to start OAC at this point as noted by Dr. Nelly Laurence.   Reviewed in office today with Dr. Nelly Laurence and the following ordered: 1.  D/C Nifedipine 2.  Start Carvedilol 12.5mg  bid  3.  Patient to monitor bp's.  ( Today in office 148/80 ) states on average 120's/80s  Patient has follow up in April with Dr. Nelly Laurence for her 91 day appointment.  We will continue to monitor device remotes for episodes.

## 2024-01-10 NOTE — Telephone Encounter (Signed)
 Pt called stating she felt a wire sticking out from her device site. I asked her to come into the office at 2 pm to be seen by the nurse. The patient agreed.

## 2024-01-11 ENCOUNTER — Encounter: Payer: Self-pay | Admitting: Family Medicine

## 2024-01-11 ENCOUNTER — Ambulatory Visit (INDEPENDENT_AMBULATORY_CARE_PROVIDER_SITE_OTHER): Payer: Medicare PPO | Admitting: Family Medicine

## 2024-01-11 ENCOUNTER — Ambulatory Visit (HOSPITAL_BASED_OUTPATIENT_CLINIC_OR_DEPARTMENT_OTHER)
Admission: RE | Admit: 2024-01-11 | Discharge: 2024-01-11 | Disposition: A | Discharge status: Home / Self Care | Source: Ambulatory Visit | Attending: Family Medicine | Admitting: Family Medicine

## 2024-01-11 VITALS — BP 118/80 | HR 62 | Temp 98.0°F | Resp 16 | Ht 62.0 in | Wt 110.8 lb

## 2024-01-11 DIAGNOSIS — E1169 Type 2 diabetes mellitus with other specified complication: Secondary | ICD-10-CM

## 2024-01-11 DIAGNOSIS — E1165 Type 2 diabetes mellitus with hyperglycemia: Secondary | ICD-10-CM | POA: Diagnosis not present

## 2024-01-11 DIAGNOSIS — I700 Atherosclerosis of aorta: Secondary | ICD-10-CM | POA: Diagnosis not present

## 2024-01-11 DIAGNOSIS — K449 Diaphragmatic hernia without obstruction or gangrene: Secondary | ICD-10-CM | POA: Diagnosis not present

## 2024-01-11 DIAGNOSIS — R071 Chest pain on breathing: Secondary | ICD-10-CM

## 2024-01-11 DIAGNOSIS — E785 Hyperlipidemia, unspecified: Secondary | ICD-10-CM | POA: Diagnosis not present

## 2024-01-11 NOTE — Progress Notes (Unsigned)
 Established Patient Office Visit  Subjective   Patient ID: Briana Craig, female    DOB: 1946-07-18  Age: 78 y.o. MRN: [id number]  Chief Complaint  Patient presents with   Diabetes   Follow-up    HPI Discussed the use of AI scribe software for clinical note transcription with the patient, who gave verbal consent to proceed.  History of Present Illness   The patient presents with chest pain and recent medication change for heart rate management.  She experiences chest pain that is severe enough to make it difficult to take a deep breath. The pain is described as being across her shoulders and affecting her teeth. It was present for most of the day but improved overnight. The pain felt similar to when her lung was previously punctured. It is not present when sitting normally and relaxed but is exacerbated by deep breaths.  She recently had a pacemaker check and was switched from her previous heart medication to carvedilol due to a fast heart rate. Since the change, she feels better, although she can still feel some discomfort.  Her blood sugar levels have been slightly high, but this is typical for her. Her most recent A1c was 6.9, which she is satisfied with as it is under 7.  She mentions that her creatinine levels were high, which she did not understand, but she acknowledges that she might not have been drinking enough fluids recently. She has been having difficulty completing a urine test due to an inability to urinate at the time of the test. She was given a bottle to take home but has not been able to provide a sample yet.  She experiences gastrointestinal discomfort if she eats heavy or spicy foods after 5 PM. She finds relief with famotidine and by avoiding such foods. She sometimes chooses to eat these foods despite the discomfort.      Patient Active Problem List   Diagnosis Date Noted   Heart block 12/14/2023   Pacemaker failure, initial encounter 11/26/2023    Abnormal CT of the chest 10/12/2023   SOB (shortness of breath) 07/16/2023   Palpitation 07/16/2023   Wrist pain, acute, right 06/03/2023   Age-related osteoporosis without current pathological fracture 11/27/2021   Preventative health care 05/27/2021   Periorbital cellulitis of right eye 09/11/2019   Diet-controlled diabetes mellitus (HCC) 05/22/2019   Ductal carcinoma in situ (DCIS) of right breast 05/22/2019   Nipple lesion 11/21/2018   Controlled type 2 diabetes mellitus without complication, without long-term current use of insulin (HCC) 05/19/2018   Hyperlipidemia associated with type 2 diabetes mellitus (HCC) 05/19/2018   Vitamin D deficiency 05/19/2018   History of esophageal cancer 05/19/2018   Hematuria 04/08/2015   UTI (urinary tract infection) 04/04/2015   DM (diabetes mellitus) type II uncontrolled, periph vascular disorder 12/06/2014   Dysuria 01/25/2014   IFG (impaired fasting glucose) 01/25/2014   Vitamin D deficiency 05/30/2013   Encounter for therapeutic drug monitoring 05/30/2013   Impaired fasting glucose 05/30/2013   Other malaise and fatigue 05/30/2013   Hyperlipidemia 05/30/2013   GERD (gastroesophageal reflux disease) 05/30/2013   Need for prophylactic vaccination with combined diphtheria-tetanus-pertussis (DTP) vaccine 05/30/2013   Esophageal cancer (HCC)    Dysphagia 11/17/2011   Cancer of esophagus (HCC) 11/13/2011   Past Medical History:  Diagnosis Date   Breast cancer (HCC) 12/2018   right DCIS   Dysphagia    Esophageal cancer (HCC)    GERD (gastroesophageal reflux disease)    Hyperlipemia  Hypertension    no meds now   Impaired fasting blood sugar    Personal history of radiation therapy    PONV (postoperative nausea and vomiting)    Pre-diabetes    Past Surgical History:  Procedure Laterality Date   BREAST BIOPSY Right 12/23/2018   BREAST LUMPECTOMY Right 01/18/2019   BREAST LUMPECTOMY WITH RADIOACTIVE SEED LOCALIZATION Right 01/18/2019    Procedure: RIGHT BREAST CENTRAL LUMPECTOMY WITH RADIOACTIVE SEED LOCALIZATION;  Surgeon: Emelia Loron, MD;  Location:  SUR[facility]ERY C[facility];  Service: General;  Laterality: Right;   DIRECT LARYNGOSCOPY WITH BOTOX INJECTION     tx paralysed vocal cord   ESOPHAGOGASTRODUODENOSCOPY  11/17/2011   Procedure: ESOPHAGOGASTRODUODENOSCOPY (EGD);  Surgeon: Rob Bunting, MD;  Location: [facility];  Service: Endoscopy;  Laterality: N/A;   ESOPHAGUS SURGERY     EYE SURGERY     both cataracts   GANGLION CYST EXCISION Left 01/26/2013   Procedure: LEFT FLEXOR CARPI RADIALIS  RELEASE AND SCAPHO TRAPEZIAL TRAPEZOID DEBRIDEMENT ;  Surgeon: Tami Ribas, MD;  Location:  SURGERY CENTER;  Service: Orthopedics;  Laterality: Left;   LEAD REVISION/REPAIR N/A 11/30/2023   Procedure: LEAD REVISION/REPAIR;  Surgeon: Marinus Maw, MD;  Location: [facility];  Service: Cardiovascular;  Laterality: N/A;   LEAD REVISION/REPAIR N/A 11/26/2023   Procedure: LEAD REVISION/REPAIR;  Surgeon: Regan Lemming, MD;  Location: [facility];  Service: Cardiovascular;  Laterality: N/A;   PACEMAKER IMPLANT N/A 09/24/2023   Procedure: PACEMAKER IMPLANT;  Surgeon: Maurice Small, MD;  Location: [facility];  Service: Cardiovascular;  Laterality: N/A;   Social History   Tobacco Use   Smoking status: Former    Current packs/day: 0.00    Types: Cigarettes    Quit date: 08/30/1987    Years since quitting: 36.4   Smokeless tobacco: Never  Substance Use Topics   Alcohol use: No   Drug use: Never   Social History   Socioeconomic History   Marital status: Widowed    Spouse name: Dorene Sorrow   Number of children: 0   Years of education: 18   Highest education level: Not on file  Occupational History   Occupation: RETIRED     Employer: OTHER  Tobacco Use   Smoking status: Former    Current packs/day: 0.00    Types: Cigarettes    Quit date: 08/30/1987    Years since quitting:  36.4   Smokeless tobacco: Never  Substance and Sexual Activity   Alcohol use: No   Drug use: Never   Sexual activity: Yes    Partners: Male    Birth control/protection: Post-menopausal  Other Topics Concern   Not on file  Social History Narrative   Marital Status:  Married Dorene Sorrow)   Children:  None    Pets: Dog (1)    Living Situation: Lives with husband.   Occupation:  Retired Loss adjuster, chartered)    Education: Master's Degree    Tobacco Use/Exposure:  Formal Smoker    Alcohol Use:  Occasional   Drug Use:  None   Diet:  Regular   Exercise:  Walking 1 mile 5 times per week.    Hobbies:  Reading                Social Drivers of Health   Financial Resource Strain: Low Risk  (01/13/2024)   Overall Financial Resource Strain (CARDIA)    Difficulty of Paying Living Expenses: Not hard at all  Food Insecurity: No Food  Insecurity (01/13/2024)   Hunger Vital Sign    Worried About Running Out of Food in the Last Year: Never true    Ran Out of Food in the Last Year: Never true  Transportation Needs: No Transportation Needs (01/13/2024)   PRAPARE - Administrator, Civil Service (Medical): No    Lack of Transportation (Non-Medical): No  Physical Activity: Inactive (01/13/2024)   Exercise Vital Sign    Days of Exercise per Week: 0 days    Minutes of Exercise per Session: 0 min  Stress: No Stress Concern Present (01/13/2024)   [employer] of Occupational Health - Occupational Stress Questionnaire    Feeling of Stress : Not at all  Social Connections: Moderately Integrated (01/13/2024)   Social Connection and Isolation Panel [NHANES]    Frequency of Communication with Friends and Family: More than three times a week    Frequency of Social Gatherings with Friends and Family: More than three times a week    Attends Religious Services: More than 4 times per year    Active Member of [employer] or Organizations: Yes    Attends Banker Meetings: More than 4 times per year     Marital Status: Widowed  Recent Concern: Social Connections - Socially Isolated (11/26/2023)   Social Connection and Isolation Panel [NHANES]    Frequency of Communication with Friends and Family: More than three times a week    Frequency of Social Gatherings with Friends and Family: More than three times a week    Attends Religious Services: Never    Database administrator or Organizations: No    Attends Banker Meetings: Never    Marital Status: Widowed  Intimate Partner Violence: Not At Risk (01/13/2024)   Humiliation, Afraid, Rape, and Kick questionnaire    Fear of Current or Ex-Partner: No    Emotionally Abused: No    Physically Abused: No    Sexually Abused: No   Family Status  Relation Name Status   Sister  Alive   Mother  Deceased   Father  Deceased   Neg Hx  (Not Specified)  No partnership data on file   Family History  Problem Relation Age of Onset   Alcohol abuse Sister    AAA (abdominal aortic aneurysm) Mother    COPD Father    Colon cancer Neg Hx    Allergies  Allergen Reactions   Taxol [Paclitaxel] Hives and Itching   Tramadol Other (See Comments)    "Sweaty, shaky and faint"   Carboplatin Hives and Itching   Codeine Other (See Comments)    Could not sleep, Shaky and Sweaty   Compazine  [Prochlorperazine Maleate] Other (See Comments)    Severe chest pain   Penicillins Other (See Comments)    Childhood   Prochlorperazine Edisylate Other (See Comments)    Severe chest pain      ROS    Objective:     BP 118/80 (BP Location: Left Arm, Patient Position: Sitting, Cuff Size: Normal)   Pulse 62   Temp 98 F (36.7 C) (Oral)   Resp 16   Ht 5\' 2"  (1.575 m)   Wt 110 lb 12.8 oz (50.3 kg)   BMI 20.27 kg/m  BP Readings from Last 3 Encounters:  01/11/24 118/80  12/07/23 130/84  12/02/23 122/87   Wt Readings from Last 3 Encounters:  01/13/24 110 lb (49.9 kg)  01/11/24 110 lb 12.8 oz (50.3 kg)  12/07/23 114 lb 6.4 oz (  51.9 kg)   SpO2  Readings from Last 3 Encounters:  12/02/23 93%  10/12/23 93%  09/24/23 93%      Physical Exam   No results found for any visits on 01/11/24.  Last CBC Lab Results  Component Value Date   WBC 7.1 01/04/2024   HGB 12.5 01/04/2024   HCT 38.5 01/04/2024   MCV 93.2 01/04/2024   MCH 30.5 12/02/2023   RDW 14.7 01/04/2024   PLT 255.0 01/04/2024   Last metabolic panel Lab Results  Component Value Date   GLUCOSE 156 (H) 01/04/2024   NA 139 01/04/2024   K 4.0 01/04/2024   CL 102 01/04/2024   CO2 27 01/04/2024   BUN 17 01/04/2024   CREATININE 1.00 01/04/2024   GFR 53.69 (L) 01/04/2024   CALCIUM 9.8 01/04/2024   PROT 7.3 01/04/2024   ALBUMIN 4.2 01/04/2024   BILITOT 0.6 01/04/2024   ALKPHOS 62 01/04/2024   AST 21 01/04/2024   ALT 14 01/04/2024   ANIONGAP 10 12/02/2023   Last lipids Lab Results  Component Value Date   CHOL 178 01/04/2024   HDL 78.10 01/04/2024   LDLCALC 77 01/04/2024   TRIG 114.0 01/04/2024   CHOLHDL 2 01/04/2024   Last hemoglobin A1c Lab Results  Component Value Date   HGBA1C 6.9 (H) 01/04/2024   Last thyroid functions Lab Results  Component Value Date   TSH 1.66 07/16/2023   Last vitamin D Lab Results  Component Value Date   VD25OH 66.43 11/27/2021   Last vitamin B12 and Folate Lab Results  Component Value Date   VITAMINB12 1,342 (H) 07/16/2023      The ASCVD Risk score (Arnett DK, et al., 2019) failed to calculate for the following reasons:   The systolic blood pressure is missing    Assessment & Plan:   Problem List Items Addressed This Visit   None Visit Diagnoses       Chest pain varying with breathing    -  Primary   Relevant Orders   DG Chest 2 View     Assessment and Plan    Chest Pain   Intermittent severe chest pain, similar to previous lung puncture pain, occurs with deep breaths. Pain improved overnight but persists slightly. Pacemaker is functioning well, but rhythm was fast, necessitating medication change.  Differential includes musculoskeletal or cardiac-related pain. Order chest x-ray to evaluate lungs. Advise informing cardiology if symptoms persist.  Chronic Kidney Disease   Elevated creatinine levels, possibly due to dehydration or age-related changes. Discussed importance of hydration and potential lab error in microalbumin ratio calculation, which could lead to misinterpretation of protein levels. Provide urine sample for microalbumin ratio recheck.  Diabetes Mellitus Type 2   Blood sugars slightly elevated but consistent with usual levels. Recent A1c was 6.9%, meeting target of under 7%.  Gastroesophageal Reflux Disease (GERD)   Symptoms managed with famotidine and dietary modifications. She declines further GI evaluation and endoscopy due to fatigue from medical appointments. Famotidine is effective, and she avoids heavy or spicy foods after 5 PM to manage symptoms. Cancel GI appointment for endoscopy.  Follow-up   Scheduled follow-up with cardiology next month. Follow up with cardiology next month.        No follow-ups on file.    Donato Schultz, DO

## 2024-01-12 ENCOUNTER — Other Ambulatory Visit (INDEPENDENT_AMBULATORY_CARE_PROVIDER_SITE_OTHER)

## 2024-01-12 DIAGNOSIS — E119 Type 2 diabetes mellitus without complications: Secondary | ICD-10-CM | POA: Diagnosis not present

## 2024-01-12 LAB — MICROALBUMIN / CREATININE URINE RATIO
Creatinine,U: 107.1 mg/dL
Microalb Creat Ratio: 72.1 mg/g — ABNORMAL HIGH (ref 0.0–30.0)
Microalb, Ur: 7.7 mg/dL — ABNORMAL HIGH (ref 0.0–1.9)

## 2024-01-12 NOTE — Progress Notes (Signed)
 Specimen drop off

## 2024-01-13 ENCOUNTER — Ambulatory Visit (INDEPENDENT_AMBULATORY_CARE_PROVIDER_SITE_OTHER): Payer: Medicare PPO

## 2024-01-13 VITALS — Ht 62.0 in | Wt 110.0 lb

## 2024-01-13 DIAGNOSIS — Z0000 Encounter for general adult medical examination without abnormal findings: Secondary | ICD-10-CM | POA: Diagnosis not present

## 2024-01-13 NOTE — Patient Instructions (Addendum)
 Briana Craig , Thank you for taking time to come for your Medicare Wellness Visit. I appreciate your ongoing commitment to your health goals. Please review the following plan we discussed and let me know if I can assist you in the future.   Referrals/Orders/Follow-Ups/Clinician Recommendations:   This is a list of the screening recommended for you and due dates:  Health Maintenance  Topic Date Due   DTaP/Tdap/Td vaccine (2 - Td or Tdap) 05/02/2023   Mammogram  06/02/2024*   COVID-19 Vaccine (8 - Pfizer risk 2024-25 season) 02/11/2024   Eye exam for diabetics  04/25/2024   Complete foot exam   06/02/2024   Hemoglobin A1C  07/06/2024   Yearly kidney function blood test for diabetes  01/03/2025   Yearly kidney health urinalysis for diabetes  01/11/2025   Medicare Annual Wellness Visit  01/12/2025   Pneumonia Vaccine  Completed   Flu Shot  Completed   DEXA scan (bone density measurement)  Completed   Hepatitis C Screening  Completed   Zoster (Shingles) Vaccine  Completed   HPV Vaccine  Aged Out   Colon Cancer Screening  Discontinued  *Topic was postponed. The date shown is not the original due date.    Advanced directives: (Copy Requested) Please bring a copy of your health care power of attorney and living will to the office to be added to your chart at your convenience. You can mail to [facility] [address] [place], [place] [address] or email to [email or web address]  Next Medicare Annual Wellness Visit scheduled for next year: Yes

## 2024-01-13 NOTE — Progress Notes (Signed)
 Subjective:   Briana Craig is a 78 y.o. who presents for a Medicare Wellness preventive visit.  Visit Complete: Virtual I connected with  Briana Craig on 01/13/24 by a audio enabled telemedicine application and verified that I am speaking with the correct person using two identifiers.  Patient Location: Home  Provider Location: Home Office  I discussed the limitations of evaluation and management by telemedicine. The patient expressed understanding and agreed to proceed.  Vital Signs: Because this visit was a virtual/telehealth visit, some criteria may be missing or patient reported. Any vitals not documented were not able to be obtained and vitals that have been documented are patient reported.  VideoDeclined- This patient declined Librarian, academic. Therefore the visit was completed with audio only.  Persons Participating in Visit: Patient.  AWV Questionnaire: No: Patient Medicare AWV questionnaire was not completed prior to this visit.  Cardiac Risk Factors include: advanced age (>59men, >8 women);diabetes mellitus     Objective:    Today's Vitals   01/13/24 1125  Weight: 110 lb (49.9 kg)  Height: 5\' 2"  (1.575 m)   Body mass index is 20.12 kg/m.     01/13/2024   11:33 AM 11/26/2023    4:45 PM 09/24/2023    2:20 PM 05/22/2023   12:39 PM 01/11/2023    1:01 PM 01/06/2022   11:45 AM 03/25/2021   10:58 AM  Advanced Directives  Does Patient Have a Medical Advance Directive? Yes No Yes No Yes Yes Yes  Type of Estate agent of [place];Living will  Living will  Healthcare Power of [place];Living will;Out of facility DNR (pink MOST or yellow form) Healthcare Power of [place];Living will;Out of facility DNR (pink MOST or yellow form)   Does patient want to make changes to medical advance directive?   No - Patient declined  No - Patient declined    Copy of Healthcare Power of Attorney in Chart? No - copy requested    No  - copy requested No - copy requested   Would patient like information on creating a medical advance directive?  No - Patient declined  No - Patient declined       Current Medications (verified) Outpatient Encounter Medications as of 01/13/2024  Medication Sig   Accu-Chek Softclix Lancets lancets Use to check blood sugar once a day.  DX  E11.9   Alcohol Swabs (ALCOHOL WIPES) 70 % PADS To use to check sugars   Blood Glucose Calibration (ACCU-CHEK AVIVA) SOLN Use to check control when opening a new bottle of strips.  DX E11.9   Blood Glucose Monitoring Suppl (ACCU-CHEK AVIVA PLUS) w/Device KIT Use to check blood sugar once a day.  DX E11.9   Blood Glucose Monitoring Suppl (TRUE METRIX AIR GLUCOSE METER) DEVI Use as directed to check glucose twice daily. E11.9   Blood Glucose Monitoring Suppl DEVI 1 each by Does not apply route in the morning, at noon, and at bedtime. May substitute to any manufacturer covered by patient's insurance.   carvedilol (COREG) 12.5 MG tablet Take 1 tablet (12.5 mg total) by mouth 2 (two) times daily with a meal.   cetirizine (ZYRTEC) 10 MG tablet Take 10 mg by mouth daily as needed for allergies.   Coenzyme Q10 (CO Q 10 PO) Take 1 capsule by mouth at bedtime. Gel capsule   famotidine (PEPCID) 40 MG tablet Take 1 tablet (40 mg total) by mouth at bedtime.   Glucos-Chondroit-Hyaluron-MSM (GLUCOSAMINE CHONDROITIN JOINT PO) Take 2,000  mg by mouth daily.   glucose blood (ACCU-CHEK AVIVA PLUS) test strip Use to check blood sugar once daily.  DX  E 11.9   latanoprost (XALATAN) 0.005 % ophthalmic solution Place 1 drop into both eyes at bedtime.   Multiple Vitamins-Minerals (MULTIVITAMIN PO) Take 1 tablet by mouth daily. Centrum silver 50+   Multiple Vitamins-Minerals (PRESERVISION AREDS) CAPS Take 1 capsule by mouth 2 (two) times daily.   Omega-3 Fatty Acids (FISH OIL PO) Take 1,200 mg by mouth daily. Krill Oil   pantoprazole (PROTONIX) 40 MG tablet Take 1 tablet (40 mg total) by  mouth daily.   simvastatin (ZOCOR) 20 MG tablet Take 1 tablet (20 mg total) by mouth daily at 6 PM.   TRUEplus Lancets 33G MISC Use as directed. Check glucose up to twice daily. E11.9   Facility-Administered Encounter Medications as of 01/13/2024  Medication   iohexol (OMNIPAQUE) 350 MG/ML injection    Allergies (verified) Taxol [paclitaxel], Tramadol, Carboplatin, Codeine, Compazine  [prochlorperazine maleate], Penicillins, and Prochlorperazine edisylate   History: Past Medical History:  Diagnosis Date   Breast cancer (HCC) 12/2018   right DCIS   Dysphagia    Esophageal cancer (HCC)    GERD (gastroesophageal reflux disease)    Hyperlipemia    Hypertension    no meds now   Impaired fasting blood sugar    Personal history of radiation therapy    PONV (postoperative nausea and vomiting)    Pre-diabetes    Past Surgical History:  Procedure Laterality Date   BREAST BIOPSY Right 12/23/2018   BREAST LUMPECTOMY Right 01/18/2019   BREAST LUMPECTOMY WITH RADIOACTIVE SEED LOCALIZATION Right 01/18/2019   Procedure: RIGHT BREAST CENTRAL LUMPECTOMY WITH RADIOACTIVE SEED LOCALIZATION;  Surgeon: Emelia Loron, MD;  Location: [facility];  Service: General;  Laterality: Right;   DIRECT LARYNGOSCOPY WITH BOTOX INJECTION     tx paralysed vocal cord   ESOPHAGOGASTRODUODENOSCOPY  11/17/2011   Procedure: ESOPHAGOGASTRODUODENOSCOPY (EGD);  Surgeon: Rob Bunting, MD;  Location: [facility];  Service: Endoscopy;  Laterality: N/A;   ESOPHAGUS SURGERY     EYE SURGERY     both cataracts   GANGLION CYST EXCISION Left 01/26/2013   Procedure: LEFT FLEXOR CARPI RADIALIS  RELEASE AND SCAPHO TRAPEZIAL TRAPEZOID DEBRIDEMENT ;  Surgeon: Tami Ribas, MD;  Location: [facility];  Service: Orthopedics;  Laterality: Left;   LEAD REVISION/REPAIR N/A 11/30/2023   Procedure: LEAD REVISION/REPAIR;  Surgeon: Marinus Maw, MD;  Location: [facility];  Service: Cardiovascular;   Laterality: N/A;   LEAD REVISION/REPAIR N/A 11/26/2023   Procedure: LEAD REVISION/REPAIR;  Surgeon: Regan Lemming, MD;  Location: [facility];  Service: Cardiovascular;  Laterality: N/A;   PACEMAKER IMPLANT N/A 09/24/2023   Procedure: PACEMAKER IMPLANT;  Surgeon: Maurice Small, MD;  Location: [facility];  Service: Cardiovascular;  Laterality: N/A;   Family History  Problem Relation Age of Onset   Alcohol abuse Sister    AAA (abdominal aortic aneurysm) Mother    COPD Father    Colon cancer Neg Hx    Social History   Socioeconomic History   Marital status: Widowed    Spouse name: Dorene Sorrow   Number of children: 0   Years of education: 18   Highest education level: Not on file  Occupational History   Occupation: RETIRED     Employer: OTHER  Tobacco Use   Smoking status: Former    Current packs/day: 0.00    Types:  Cigarettes    Quit date: 08/30/1987    Years since quitting: 36.3   Smokeless tobacco: Never  Substance and Sexual Activity   Alcohol use: No   Drug use: Never   Sexual activity: Yes    Partners: Male    Birth control/protection: Post-menopausal  Other Topics Concern   Not on file  Social History Narrative   Marital Status:  Married Dorene Sorrow)   Children:  None    Pets: Dog (1)    Living Situation: Lives with husband.   Occupation:  Retired Loss adjuster, chartered)    Education: Master's Degree    Tobacco Use/Exposure:  Formal Smoker    Alcohol Use:  Occasional   Drug Use:  None   Diet:  Regular   Exercise:  Walking 1 mile 5 times per week.    Hobbies:  Reading                Social Drivers of Health   Financial Resource Strain: Low Risk  (01/13/2024)   Overall Financial Resource Strain (CARDIA)    Difficulty of Paying Living Expenses: Not hard at all  Food Insecurity: No Food Insecurity (01/13/2024)   Hunger Vital Sign    Worried About Running Out of Food in the Last Year: Never true    Ran Out of Food in the Last Year: Never true   Transportation Needs: No Transportation Needs (01/13/2024)   PRAPARE - Administrator, Civil Service (Medical): No    Lack of Transportation (Non-Medical): No  Physical Activity: Inactive (01/13/2024)   Exercise Vital Sign    Days of Exercise per Week: 0 days    Minutes of Exercise per Session: 0 min  Stress: No Stress Concern Present (01/13/2024)   [employer] of Occupational Health - Occupational Stress Questionnaire    Feeling of Stress : Not at all  Social Connections: Moderately Integrated (01/13/2024)   Social Connection and Isolation Panel [NHANES]    Frequency of Communication with Friends and Family: More than three times a week    Frequency of Social Gatherings with Friends and Family: More than three times a week    Attends Religious Services: More than 4 times per year    Active Member of [employer] or Organizations: Yes    Attends Banker Meetings: More than 4 times per year    Marital Status: Widowed  Recent Concern: Social Connections - Socially Isolated (11/26/2023)   Social Connection and Isolation Panel [NHANES]    Frequency of Communication with Friends and Family: More than three times a week    Frequency of Social Gatherings with Friends and Family: More than three times a week    Attends Religious Services: Never    Database administrator or Organizations: No    Attends Banker Meetings: Never    Marital Status: Widowed    Tobacco Counseling Counseling given: Not Answered    Clinical Intake:  Pre-visit preparation completed: Yes  Pain : No/denies pain     BMI - recorded: 20.12 Nutritional Status: BMI of 19-24  Normal Nutritional Risks: None Diabetes: Yes CBG done?: No Did pt. bring in CBG monitor from home?: No  How often do you need to have someone help you when you read instructions, pamphlets, or other written materials from your doctor or pharmacy?: 1 - Never  Interpreter Needed?: No  Information entered  by :: Theresa Mulligan LPN   Activities of Daily Living     01/13/2024   11:32  AM 11/26/2023    4:45 PM  In your present state of health, do you have any difficulty performing the following activities:  Hearing? 0 0  Vision? 0 0  Difficulty concentrating or making decisions? 0 0  Walking or climbing stairs? 0   Dressing or bathing? 0   Doing errands, shopping? 0 0  Preparing Food and eating ? N   Using the Toilet? N   In the past six months, have you accidently leaked urine? N   Do you have problems with loss of bowel control? N   Managing your Medications? N   Managing your Finances? N   Housekeeping or managing your Housekeeping? N     Patient Care Team: Zola Button, Grayling Congress, DO as PCP - General (Family Medicine) Nelson Chimes, MD as Consulting Physician (Ophthalmology) Lurline Hare, MD as Referring Physician (Radiation Oncology) Surgery, [facility] (General Surgery)  Indicate any recent Medical Services you may have received from other than [facility] providers in the past year (date may be approximate).     Assessment:   This is a routine wellness examination for Briana Craig.  Hearing/Vision screen Hearing Screening - Comments:: Denies hearing difficulties   Vision Screening - Comments:: Wears rx glasses - up to date with routine eye exams with  [facility]   Goals Addressed               This Visit's Progress     Increase physical activity (pt-stated)         Depression Screen     01/13/2024   11:31 AM 01/11/2023    1:03 PM 11/30/2022    8:31 AM 01/06/2022   11:57 AM 01/06/2022   11:56 AM 12/19/2020    3:23 PM 11/24/2019    2:13 PM  PHQ 2/9 Scores  PHQ - 2 Score 0 0 0 0 0 0 0    Fall Risk     01/13/2024   11:32 AM 10/12/2023    3:13 PM 01/11/2023    1:01 PM 11/30/2022    8:31 AM 01/06/2022   11:44 AM  Fall Risk   Falls in the past year? 0 0 0 0 0  Number falls in past yr: 0  0 0 0  Injury with Fall? 0  0 0 0  Risk for fall due to : No Fall Risks  No  Fall Risks  No Fall Risks  Follow up Falls prevention discussed;Falls evaluation completed  Falls evaluation completed Falls evaluation completed     MEDICARE RISK AT HOME:  Medicare Risk at Home Any stairs in or around the home?: No If so, are there any without handrails?: No Home free of loose throw rugs in walkways, pet beds, electrical cords, etc?: Yes Adequate lighting in your home to reduce risk of falls?: Yes Life alert?: No Use of a cane, walker or w/c?: No Grab bars in the bathroom?: Yes Shower chair or bench in shower?: Yes Elevated toilet seat or a handicapped toilet?: No  TIMED UP AND GO:  Was the test performed?  No  Cognitive Function: 6CIT completed        01/13/2024   11:33 AM 01/11/2023    1:07 PM 01/06/2022   11:49 AM  6CIT Screen  What Year? 0 points 0 points 0 points  What month? 0 points 0 points 0 points  What time? 0 points 0 points 0 points  Count back from 20 0 points 0 points 0 points  Months in reverse 0  points 0 points 0 points  Repeat phrase 0 points 0 points 0 points  Total Score 0 points 0 points 0 points    Immunizations Immunization History  Administered Date(s) Administered   Fluad Quad(high Dose 65+) 08/18/2022   Fluad Trivalent(High Dose 65+) 08/13/2023   Influenza, High Dose Seasonal PF 08/16/2014, 08/13/2015, 08/20/2016, 08/02/2017, 08/16/2018, 07/17/2019   Influenza-Unspecified 08/13/2015, 08/01/2020, 08/02/2021   PFIZER(Purple Top)SARS-COV-2 Vaccination 11/21/2019, 12/11/2019, 08/02/2020, 04/24/2021   Pfizer Covid-19 Vaccine Bivalent Booster 56yrs & up 08/12/2021   Pfizer(Comirnaty)Fall Seasonal Vaccine 12 years and older 08/18/2022, 08/13/2023   Pneumococcal Conjugate-13 09/03/2014   Pneumococcal Polysaccharide-23 12/03/2010, 11/16/2016   Tdap 05/01/2013   Zoster Recombinant(Shingrix) 06/04/2018, 10/01/2018    Screening Tests Health Maintenance  Topic Date Due   DTaP/Tdap/Td (2 - Td or Tdap) 05/02/2023   MAMMOGRAM   06/02/2024 (Originally 03/18/2022)   COVID-19 Vaccine (8 - Pfizer risk 2024-25 season) 02/11/2024   OPHTHALMOLOGY EXAM  04/25/2024   FOOT EXAM  06/02/2024   HEMOGLOBIN A1C  07/06/2024   Diabetic kidney evaluation - eGFR measurement  01/03/2025   Diabetic kidney evaluation - Urine ACR  01/11/2025   Medicare Annual Wellness (AWV)  01/12/2025   Pneumonia Vaccine 19+ Years old  Completed   INFLUENZA VACCINE  Completed   DEXA SCAN  Completed   Hepatitis C Screening  Completed   Zoster Vaccines- Shingrix  Completed   HPV VACCINES  Aged Out   Colonoscopy  Discontinued    Health Maintenance  Health Maintenance Due  Topic Date Due   DTaP/Tdap/Td (2 - Td or Tdap) 05/02/2023   Health Maintenance Items Addressed:    Additional Screening:  Vision Screening: Recommended annual ophthalmology exams for early detection of glaucoma and other disorders of the eye.  Dental Screening: Recommended annual dental exams for proper oral hygiene  Community Resource Referral / Chronic Care Management: CRR required this visit?  No   CCM required this visit?  No     Plan:     I have personally reviewed and noted the following in the patient's chart:   Medical and social history Use of alcohol, tobacco or illicit drugs  Current medications and supplements including opioid prescriptions. Patient is not currently taking opioid prescriptions. Functional ability and status Nutritional status Physical activity Advanced directives List of other physicians Hospitalizations, surgeries, and ER visits in previous 12 months Vitals Screenings to include cognitive, depression, and falls Referrals and appointments  In addition, I have reviewed and discussed with patient certain preventive protocols, quality metrics, and best practice recommendations. A written personalized care plan for preventive services as well as general preventive health recommendations were provided to patient.     Tillie Rung, LPN   02/08/8118   After Visit Summary: (MyChart) Due to this being a telephonic visit, the after visit summary with patients personalized plan was offered to patient via MyChart   Notes: Nothing significant to report at this time.

## 2024-01-14 ENCOUNTER — Ambulatory Visit: Attending: Cardiology

## 2024-01-14 ENCOUNTER — Telehealth: Payer: Self-pay

## 2024-01-14 DIAGNOSIS — I459 Conduction disorder, unspecified: Secondary | ICD-10-CM

## 2024-01-14 DIAGNOSIS — R071 Chest pain on breathing: Secondary | ICD-10-CM | POA: Insufficient documentation

## 2024-01-14 LAB — CUP PACEART INCLINIC DEVICE CHECK
Date Time Interrogation Session: 20250314122810
Implantable Lead Connection Status: 753985
Implantable Lead Connection Status: 753985
Implantable Lead Implant Date: 20250128
Implantable Lead Implant Date: 20250128
Implantable Lead Location: 753859
Implantable Lead Location: 753860
Implantable Lead Model: 5076
Implantable Lead Model: 5076
Implantable Pulse Generator Implant Date: 20250128

## 2024-01-14 NOTE — Progress Notes (Signed)
 Pt seen in device clinic to evaluate for atrial fibrillation burden and check incision for possible stitch. Pt presenting NSR.  Pt has had increased episodes of atrial fib/flutter.  Burden 3.7%.  Per Pt she has paroxysmal episodes of shortness of breath.    One stitch noted at distal end of incision.  This was removed.  No further stitches noted.  Incision site well healed.  Advised Pt that no further wound checks needed unless she notices anything further.  Will forward to Dr. Nelly Laurence for review of afib burden.  Pt started carvedilol on 01/10/2024.

## 2024-01-14 NOTE — Telephone Encounter (Addendum)
 Alert received from [employer]:  Alert remote transmission: AT/AF daily burden >threshold AF in progress from 3/13 @ 22:43, controlled rates, burden 1.8%, no OAC - route to triage high alert per protocol  Outreach to Pt.  Per Pt she was asleep when this alert was sent.  Denies noticing any symptoms last night or this morning.  Attempted to send a manual transmission, but she was unable to.  Pt thinks she has another stitch at her incision site.    Scheduled Pt to come to device clinic today for interrogation to assess afib burden and wound care.

## 2024-01-17 ENCOUNTER — Encounter: Payer: Self-pay | Admitting: Cardiovascular Disease

## 2024-01-17 ENCOUNTER — Ambulatory Visit (INDEPENDENT_AMBULATORY_CARE_PROVIDER_SITE_OTHER): Payer: Medicare PPO

## 2024-01-17 DIAGNOSIS — I459 Conduction disorder, unspecified: Secondary | ICD-10-CM

## 2024-01-18 ENCOUNTER — Ambulatory Visit

## 2024-01-21 ENCOUNTER — Encounter: Payer: Self-pay | Admitting: Family Medicine

## 2024-01-24 ENCOUNTER — Encounter: Payer: Self-pay | Admitting: Family Medicine

## 2024-01-24 ENCOUNTER — Encounter: Payer: Self-pay | Admitting: Family

## 2024-01-24 ENCOUNTER — Telehealth: Payer: Self-pay | Admitting: Cardiovascular Disease

## 2024-01-24 ENCOUNTER — Ambulatory Visit: Payer: Self-pay | Admitting: Family Medicine

## 2024-01-24 ENCOUNTER — Ambulatory Visit (INDEPENDENT_AMBULATORY_CARE_PROVIDER_SITE_OTHER): Admitting: Family Medicine

## 2024-01-24 VITALS — BP 122/78 | HR 65 | Ht 62.0 in | Wt 115.0 lb

## 2024-01-24 DIAGNOSIS — R0609 Other forms of dyspnea: Secondary | ICD-10-CM

## 2024-01-24 MED ORDER — ALBUTEROL SULFATE HFA 108 (90 BASE) MCG/ACT IN AERS: 2.0000 | INHALATION_SPRAY | Freq: Four times a day (QID) | RESPIRATORY_TRACT | 0 refills | Status: DC | PRN

## 2024-01-24 NOTE — Telephone Encounter (Signed)
   Chief Complaint: SOB Symptoms: SOB Frequency: since Feb   Disposition: [] ED /[] Urgent Care (no appt availability in office) / [x] Appointment(In office/virtual)/ []  [facility]/ [] Home Care/ [] Refused Recommended Disposition /[] [place] [facility]/ []  Follow-up with PCP Additional Notes: Pt called SOB since end of Feb 2025 when pacemaker was replaced. Pt states she feels the SOB is increasing while up moving around. Pt denies SOB while resting.  Pt didn't sound SOB while talking. Pt stated her color is good. Hands warm, feet cold. Pt stated she just feels tired. Pt stated HR was "lower and feels normal" but couldn't give heart rate. Pt was put on Beta Blocker last week and stated it helped. Pt could not get O2 sat monitor to work.  Pt has appt this morning at 10am. RN gave care advice and pt verbalized understanding.             Copied from CRM [id number]. Topic: Clinical - Red Word Triage >> Jan 24, 2024  8:28 AM Fredrich Romans wrote: Red Word that prompted transfer to Nurse Triage: shortness of breath,panting for air Reason for Disposition  [1] MILD difficulty breathing (e.g., minimal/no SOB at rest, SOB with walking, pulse <100) AND [2] NEW-onset or WORSE than normal  Answer Assessment - Initial Assessment Questions 1. RESPIRATORY STATUS: "Describe your breathing?" (e.g., wheezing, shortness of breath, unable to speak, severe coughing)      Can't move without of being SOB  2. ONSET: "When did this breathing problem begin?"      End of Feb  3. PATTERN "Does the difficult breathing come and go, or has it been constant since it started?"      Constant  4. SEVERITY: "How bad is your breathing?" (e.g., mild, moderate, severe)    - MILD: No SOB at rest, mild SOB with walking, speaks normally in sentences, can lie down, no retractions, pulse < 100.    - MODERATE: SOB at rest, SOB with minimal exertion and prefers to sit, cannot lie down flat, speaks in phrases, mild retractions,  audible wheezing, pulse 100-120.    - SEVERE: Very SOB at rest, speaks in single words, struggling to breathe, sitting hunched forward, retractions, pulse > 120      Moderate  5. RECURRENT SYMPTOM: "Have you had difficulty breathing before?" If Yes, ask: "When was the last time?" and "What happened that time?"      Yes before pacemaker was placed in 12/2023 6. CARDIAC HISTORY: "Do you have any history of heart disease?" (e.g., heart attack, angina, bypass surgery, angioplasty)      A Fib 7. LUNG HISTORY: "Do you have any history of lung disease?"  (e.g., pulmonary embolus, asthma, emphysema)     Denies  8. CAUSE: "What do you think is causing the breathing problem?"      Not  9. OTHER SYMPTOMS: "Do you have any other symptoms? (e.g., dizziness, runny nose, cough, chest pain, fever)     shaky 10. O2 SATURATION MONITOR:  "Do you use an oxygen saturation monitor (pulse oximeter) at home?" If Yes, ask: "What is your reading (oxygen level) today?" "What is your usual oxygen saturation reading?" (e.g., 95%)       No  Protocols used: Breathing Difficulty-A-AH

## 2024-01-24 NOTE — Telephone Encounter (Signed)
 Pt has an appt today.

## 2024-01-24 NOTE — Telephone Encounter (Signed)
 Pt c/o Shortness Of Breath: STAT if SOB developed within the last 24 hours or pt is noticeably SOB on the phone  1. Are you currently SOB (can you hear that pt is SOB on the phone)?  No   2. How long have you been experiencing SOB?  Has been ongoing since PPM implant  3. Are you SOB when sitting or when up moving around?  Occurs as soon as she stands up and doesn't stop until she sits down again   4. Are you currently experiencing any other symptoms?  No

## 2024-01-24 NOTE — Progress Notes (Signed)
 Chief Complaint  Patient presents with   Shortness of Breath    Patient presents today for SOB since her pacemaker replacement on 11/26/23.    Briana Craig here for SOB on exertion.  Duration: 2 months  Associated symptoms: wheezing and coughing Denies: sinus pain, myalgia, swelling, and fevers Treatment to date: none No SOB at rest. This is getting worse.  This started after her pacemaker was placed in late January. PCP ordered a chest x-ray on 3/11 which has not been read yet. Labs done at that visit were unremarkable for shortness of breath. CT chest ordered for next month with Dr. Delton Coombes of the pulmonology team.  Past Medical History:  Diagnosis Date   Breast cancer (HCC) 12/2018   right DCIS   Dysphagia    Esophageal cancer (HCC)    GERD (gastroesophageal reflux disease)    Hyperlipemia    Hypertension    no meds now   Impaired fasting blood sugar    Personal history of radiation therapy    PONV (postoperative nausea and vomiting)    Pre-diabetes     Objective BP 122/78   Pulse 65   Ht 5\' 2"  (1.575 m)   Wt 115 lb (52.2 kg)   SpO2 100%   BMI 21.03 kg/m  General: Awake, alert, appears stated age HEENT: AT, [place], ears patent b/l and TM's neg, nares patent w/o discharge, pharynx pink and without exudates, MMM Neck: No masses or asymmetry Heart: RRR, no lower extremity edema Lungs: CTAB, no accessory muscle use Psych: Age appropriate judgment and insight, normal mood and affect  DOE (dyspnea on exertion) - Plan: albuterol (VENTOLIN HFA) 108 (90 Base) MCG/ACT inhaler  Trial short acting beta agonist as needed.  Official read of x-ray shows no pulmonary edema or effusion.  No signs of infection.  Reach out to cardiology team to see if this is typical after pacemaker placement.  Appreciate pulmonology input.  If breathing worsens, she is to go to the emergency department for further evaluation. Pt voiced understanding and agreement to the plan.  I spent 31 minutes  with the patient discussing the above plan in addition to reviewing her chart on the same day of the visit.  Briana Roche [place], DO 01/24/24 12:04 PM

## 2024-01-24 NOTE — Patient Instructions (Signed)
 I do recommend reaching out to the cardiology team regarding this situation.   If the breathing gets worse, go to the ER.  Let us know if you need anything.

## 2024-01-25 ENCOUNTER — Telehealth: Payer: Self-pay

## 2024-01-25 ENCOUNTER — Ambulatory Visit (HOSPITAL_COMMUNITY)
Admission: RE | Admit: 2024-01-25 | Discharge: 2024-01-25 | Disposition: A | Discharge status: Home / Self Care | Source: Ambulatory Visit | Attending: Internal Medicine | Admitting: Internal Medicine

## 2024-01-25 VITALS — BP 150/84 | HR 142 | Ht 62.0 in | Wt 115.0 lb

## 2024-01-25 DIAGNOSIS — E785 Hyperlipidemia, unspecified: Secondary | ICD-10-CM | POA: Diagnosis not present

## 2024-01-25 DIAGNOSIS — R001 Bradycardia, unspecified: Secondary | ICD-10-CM | POA: Diagnosis not present

## 2024-01-25 DIAGNOSIS — I442 Atrioventricular block, complete: Secondary | ICD-10-CM | POA: Insufficient documentation

## 2024-01-25 DIAGNOSIS — Z950 Presence of cardiac pacemaker: Secondary | ICD-10-CM | POA: Insufficient documentation

## 2024-01-25 DIAGNOSIS — Z8501 Personal history of malignant neoplasm of esophagus: Secondary | ICD-10-CM | POA: Insufficient documentation

## 2024-01-25 DIAGNOSIS — I10 Essential (primary) hypertension: Secondary | ICD-10-CM | POA: Insufficient documentation

## 2024-01-25 DIAGNOSIS — D6869 Other thrombophilia: Secondary | ICD-10-CM | POA: Diagnosis not present

## 2024-01-25 DIAGNOSIS — E119 Type 2 diabetes mellitus without complications: Secondary | ICD-10-CM | POA: Diagnosis not present

## 2024-01-25 DIAGNOSIS — I480 Paroxysmal atrial fibrillation: Secondary | ICD-10-CM | POA: Insufficient documentation

## 2024-01-25 MED ORDER — CARVEDILOL 12.5 MG PO TABS: ORAL_TABLET | ORAL | 2 refills | Status: DC

## 2024-01-25 MED ORDER — APIXABAN 5 MG PO TABS: 5.0000 mg | ORAL_TABLET | Freq: Two times a day (BID) | ORAL | 3 refills | Status: DC

## 2024-01-25 NOTE — Telephone Encounter (Signed)
 Alert received from [employer].  Pt initiated transmission to follow up on shortness of breath.  Normal device function.  11 new AF events, controlled rates, longest duration 14hrs , no OAC per PA report Burden 23.2% - route to triage high alert for increased burden, no OAC.  Forwarding to Dr. Nelly Laurence for review/advisement.

## 2024-01-25 NOTE — Progress Notes (Addendum)
 Primary Care Physician: Zola Button, Grayling Congress, DO Primary Cardiologist: None Electrophysiologist: Dr. Nelly Laurence    Referring Physician: Device clinic     Briana Craig is a 78 y.o. female with a history of HLD, T2DM, HTN, esophageal cancer, symptomatic bradycardia with paroxysmal CHB s/p PPM 09/24/23, lead revision, and paroxysmal atrial fibrillation who presents for consultation in the [facility]. Device clinic alert today due to new Afib with controlled HR and longest duration almost 15 hours. Patient is not on OAC. She has a CHADS2VASC score of 5.  On evaluation today, she is currently in V paced rhythm. She notes to feel SOB that is ongoing and not necessarily better since PPM implantation. She does not necessarily note that SOB has been worse today. She is not sure if current carvedilol has been an improvement with her SOB.  Today, she denies symptoms of palpitations, chest pain, orthopnea, PND, lower extremity edema, dizziness, presyncope, syncope, snoring, daytime somnolence, bleeding, or neurologic sequela. The patient is tolerating medications without difficulties and is otherwise without complaint today.    she has a BMI of Body mass index is 21.03 kg/m.Marland Kitchen Filed Weights   01/25/24 1447  Weight: 52.2 kg    Current Outpatient Medications  Medication Sig Dispense Refill   Accu-Chek Softclix Lancets lancets Use to check blood sugar once a day.  DX  E11.9 100 each 1   albuterol (VENTOLIN HFA) 108 (90 Base) MCG/ACT inhaler Inhale 2 puffs into the lungs every 6 (six) hours as needed for wheezing or shortness of breath. 8 g 0   Alcohol Swabs (ALCOHOL WIPES) 70 % PADS To use to check sugars 100 each 1   apixaban (ELIQUIS) 5 MG TABS tablet Take 1 tablet (5 mg total) by mouth 2 (two) times daily. 60 tablet 3   Blood Glucose Calibration (ACCU-CHEK AVIVA) SOLN Use to check control when opening a new bottle of strips.  DX E11.9 1 each 1   Blood Glucose  Monitoring Suppl (ACCU-CHEK AVIVA PLUS) w/Device KIT Use to check blood sugar once a day.  DX E11.9 1 kit 0   Blood Glucose Monitoring Suppl (TRUE METRIX AIR GLUCOSE METER) DEVI Use as directed to check glucose twice daily. E11.9 1 each 0   Blood Glucose Monitoring Suppl DEVI 1 each by Does not apply route in the morning, at noon, and at bedtime. May substitute to any manufacturer covered by patient's insurance. 1 each 0   cetirizine (ZYRTEC) 10 MG tablet Take 10 mg by mouth daily as needed for allergies.     Coenzyme Q10 (CO Q 10 PO) Take 1 capsule by mouth at bedtime. Gel capsule     famotidine (PEPCID) 40 MG tablet Take 1 tablet (40 mg total) by mouth at bedtime. 90 tablet 1   Glucos-Chondroit-Hyaluron-MSM (GLUCOSAMINE CHONDROITIN JOINT PO) Take 2,000 mg by mouth daily.     glucose blood (ACCU-CHEK AVIVA PLUS) test strip Use to check blood sugar once daily.  DX  E 11.9 100 strip 1   latanoprost (XALATAN) 0.005 % ophthalmic solution Place 1 drop into both eyes at bedtime.     Multiple Vitamins-Minerals (MULTIVITAMIN PO) Take 1 tablet by mouth daily. Centrum silver 50+     Multiple Vitamins-Minerals (PRESERVISION AREDS) CAPS Take 1 capsule by mouth 2 (two) times daily.     Omega-3 Fatty Acids (FISH OIL PO) Take 1,200 mg by mouth daily. Krill Oil     pantoprazole (PROTONIX) 40 MG tablet Take 1 tablet (  40 mg total) by mouth daily. 90 tablet 1   simvastatin (ZOCOR) 20 MG tablet Take 1 tablet (20 mg total) by mouth daily at 6 PM. 90 tablet 1   TRUEplus Lancets 33G MISC Use as directed. Check glucose up to twice daily. E11.9 100 each 11   carvedilol (COREG) 12.5 MG tablet Take 25mg  (2 tablets) in the morning and 12.5mg  (1 tablet) in the evening 90 tablet 2   No current facility-administered medications for this encounter.   Facility-Administered Medications Ordered in Other Encounters  Medication Dose Route Frequency Provider Last Rate Last Admin   iohexol (OMNIPAQUE) 350 MG/ML injection    PRN  Camnitz, Will Daphine Deutscher, MD   30 mL at 11/26/23 1800    Atrial Fibrillation Management history:  Previous antiarrhythmic drugs: none Previous cardioversions: none Previous ablations: none Anticoagulation history: none   ROS- All systems are reviewed and negative except as per the HPI above.  Physical Exam: BP (!) 150/84   Pulse (!) 142   Ht 5\' 2"  (1.575 m)   Wt 52.2 kg   BMI 21.03 kg/m   GEN: Well nourished, well developed in no acute distress NECK: No JVD; No carotid bruits CARDIAC: Regular rate and rhythm with ectopy, no murmurs, rubs, gallops RESPIRATORY:  Clear to auscultation without rales, wheezing or rhonchi  ABDOMEN: Soft, non-tender, non-distended EXTREMITIES:  No edema; No deformity   EKG today demonstrates  Vent. rate 142 BPM - this rate does not appear accurate; physical exam is much slower PR interval * ms QRS duration 92 ms QT/QTcB 148/227 ms P-R-T axes * -74 95 Ventricular-paced rhythm with frequent supraventricular complexes Abnormal ECG When compared with ECG of 27-Nov-2023 04:35, PREVIOUS ECG IS PRESENT  Echo 08/17/23 demonstrated   1. Left ventricular ejection fraction, by estimation, is 60 to 65%. The  left ventricle has normal function. Left ventricular endocardial border  not optimally defined to evaluate regional wall motion. Wall motion  grossly norma. Left ventricular diastolic   parameters were normal.   2. Right ventricular systolic function is normal. The right ventricular  size is normal. There is normal pulmonary artery systolic pressure. The  estimated right ventricular systolic pressure is 33.5 mmHg.   3. The mitral valve is grossly normal. Trivial mitral valve  regurgitation. No evidence of mitral stenosis.   4. The aortic valve is tricuspid. Aortic valve regurgitation is mild. No  aortic stenosis is present.   5. The inferior vena cava is normal in size with greater than 50%  respiratory variability, suggesting right atrial pressure  of 3 mmHg.    ASSESSMENT & PLAN CHA2DS2-VASc Score = 5  The patient's score is based upon: CHF History: 0 HTN History: 1 Diabetes History: 1 Stroke History: 0 Vascular Disease History: 0 Age Score: 2 Gender Score: 1       ASSESSMENT AND PLAN: Paroxysmal Atrial Fibrillation (ICD10:  I48.0) The patient's CHA2DS2-VASc score is 5, indicating a 7.2% annual risk of stroke.    She is currently in V paced rhythm with ectopy noted. I do not believe HR noted on ECG is accurate - CMA performing ECG noted HR was 78 on machine. On physical exam I do not appreciate a tachycardic rhythm; I do note ectopy on exam. She notes ongoing SOB that is not better since PPM implantation. This seems to be a great limiting factor in her ability to do daily activity. After discussion, we will try to increase her coreg slightly to 25 mg AM 12.5 mg PM  and determine if there is any benefit for her / reduction in ectopy. She has an upcoming device visit with Dr. Nelly Laurence in 1 month so this can be used to assess Afib burden over the past 30 days.   Secondary Hypercoagulable State (ICD10:  D68.69) The patient is at significant risk for stroke/thromboembolism based upon her CHA2DS2-VASc Score of 5.  Start Apixaban (Eliquis).  We discussed the risks vs benefits of anticoagulation with regard to stroke prevention and bleeding risk. After discussion, patient wishes to begin [facility]. She qualifies for Eliquis 5 mg BID based on age of 26, weight below 60 kg, and creatinine < 1.5. Once she is 78 years of age, she would have to be transitioned to Eliquis 2.5 mg BID. Would recommend CBC at upcoming visit.    Follow up as scheduled with Dr. Nelly Laurence.    Lake Bells, PA-C  Afib Clinic [facility] [address] [place], [place] [address] [phone]

## 2024-01-25 NOTE — Patient Instructions (Signed)
 Increase coreg to 25mg  (2 tablets) in the morning and 12.5mg  (1 tablet) in the evening   Start Eliquis 5mg  twice a day  Have labs drawn at Dr Morrie Sheldon visit next month

## 2024-01-25 NOTE — Telephone Encounter (Signed)
 Discussed with Dr. Nelly Laurence.  Advised to send Pt to afib clinic for increased AF burden with symptoms.  Pt scheduled to see Afib clinic today at 3 pm.  Pt aware and in agreement.

## 2024-02-01 DIAGNOSIS — H43813 Vitreous degeneration, bilateral: Secondary | ICD-10-CM | POA: Diagnosis not present

## 2024-02-01 DIAGNOSIS — H353221 Exudative age-related macular degeneration, left eye, with active choroidal neovascularization: Secondary | ICD-10-CM | POA: Diagnosis not present

## 2024-02-01 DIAGNOSIS — H35373 Puckering of macula, bilateral: Secondary | ICD-10-CM | POA: Diagnosis not present

## 2024-02-01 DIAGNOSIS — Z961 Presence of intraocular lens: Secondary | ICD-10-CM | POA: Diagnosis not present

## 2024-02-10 DIAGNOSIS — H353231 Exudative age-related macular degeneration, bilateral, with active choroidal neovascularization: Secondary | ICD-10-CM | POA: Diagnosis not present

## 2024-02-10 DIAGNOSIS — H401131 Primary open-angle glaucoma, bilateral, mild stage: Secondary | ICD-10-CM | POA: Diagnosis not present

## 2024-02-10 DIAGNOSIS — Z961 Presence of intraocular lens: Secondary | ICD-10-CM | POA: Diagnosis not present

## 2024-02-10 DIAGNOSIS — H26492 Other secondary cataract, left eye: Secondary | ICD-10-CM | POA: Diagnosis not present

## 2024-02-29 ENCOUNTER — Ambulatory Visit
Admission: RE | Admit: 2024-02-29 | Discharge: 2024-02-29 | Disposition: A | Discharge status: Home / Self Care | Payer: Medicare PPO | Source: Ambulatory Visit | Attending: Emergency Medicine | Admitting: Emergency Medicine

## 2024-02-29 DIAGNOSIS — R911 Solitary pulmonary nodule: Secondary | ICD-10-CM | POA: Diagnosis not present

## 2024-02-29 DIAGNOSIS — R9389 Abnormal findings on diagnostic imaging of other specified body structures: Secondary | ICD-10-CM

## 2024-03-01 ENCOUNTER — Ambulatory Visit: Payer: Medicare PPO | Attending: Cardiovascular Disease | Admitting: Cardiovascular Disease

## 2024-03-01 ENCOUNTER — Encounter: Payer: Self-pay | Admitting: Cardiovascular Disease

## 2024-03-01 VITALS — BP 162/80 | HR 66 | Ht 62.0 in | Wt 107.0 lb

## 2024-03-01 DIAGNOSIS — I480 Paroxysmal atrial fibrillation: Secondary | ICD-10-CM

## 2024-03-01 LAB — CUP PACEART INCLINIC DEVICE CHECK
Date Time Interrogation Session: 20250430124837
Implantable Lead Connection Status: 753985
Implantable Lead Connection Status: 753985
Implantable Lead Implant Date: 20250128
Implantable Lead Implant Date: 20250128
Implantable Lead Location: 753859
Implantable Lead Location: 753860
Implantable Lead Model: 5076
Implantable Lead Model: 5076
Implantable Pulse Generator Implant Date: 20250128

## 2024-03-01 NOTE — Patient Instructions (Signed)
 Medication Instructions:  Your physician recommends that you continue on your current medications as directed. Please refer to the Current Medication list given to you today. *If you need a refill on your cardiac medications before your next appointment, please call your pharmacy*  Lab Work: CBC and BMET - please have pre-procedure lab work completed on Monday, June 30 . This can be done at ANY [facility] near you - no appointment required and this does not have to be fasting. If you have labs (blood work) drawn today and your tests are completely normal, you will receive your results only by: MyChart Message (if you have MyChart) OR A paper copy in the mail If you have any lab test that is abnormal or we need to change your treatment, we will call you to review the results.  Testing/Procedures: Cardiac CT - someone will contact you to schedule this  Your physician has requested that you have cardiac CT. Cardiac computed tomography (CT) is a painless test that uses an x-ray machine to take clear, detailed pictures of your heart. For further information please visit [email or web address] Please follow instruction sheet as given.   Atrial Fibrillation Ablation - scheduled on Monday, July 28.  We will be in contact closer to your ablation date with further instructions Your physician has recommended that you have an ablation. Catheter ablation is a medical procedure used to treat some cardiac arrhythmias (irregular heartbeats). During catheter ablation, a long, thin, flexible tube is put into a blood vessel in your groin (upper thigh), or neck. This tube is called an ablation catheter. It is then guided to your heart through the blood vessel. Radio frequency waves destroy small areas of heart tissue where abnormal heartbeats may cause an arrhythmia to start. Please see the instruction sheet given to you today.  Follow-Up: At [facility], you and your health needs are our priority.  As part of  our continuing mission to provide you with exceptional heart care, our providers are all part of one team.  This team includes your primary Cardiologist (physician) and Advanced Practice Providers or APPs (Physician Assistants and Nurse Practitioners) who all work together to provide you with the care you need, when you need it.  Your next appointment:   We will schedule follow up after your ablation  Provider:   Marlane Silver, MD   Cardiac Ablation Cardiac ablation is a procedure to destroy, or ablate, a small amount of heart tissue that is causing problems. The heart has many electrical connections. Sometimes, these connections are abnormal and can cause the heart to beat very fast or irregularly. Ablating the abnormal areas can improve the heart's rhythm or return it to normal. Ablation may be done for people who: Have irregular or rapid heartbeats (arrhythmias). Have Wolff-Parkinson-White syndrome. Have taken medicines for an arrhythmia that did not work or caused side effects. Have a high-risk heartbeat that may be life-threatening. Tell a health care provider about: Any allergies you have. All medicines you are taking, including vitamins, herbs, eye drops, creams, and over-the-counter medicines. Any problems you or family members have had with anesthesia. Any bleeding problems you have. Any surgeries you have had. Any medical conditions you have. Whether you are pregnant or may be pregnant. What are the risks? Your health care provider will talk with you about risks. These may include: Infection. Bruising and bleeding. Stroke or blood clots. Damage to nearby structures or organs. Allergic reaction to medicines or dyes. Needing a pacemaker if the heart gets  damaged. A pacemaker is a device that helps the heart beat normally. Failure of the procedure. A repeat procedure may be needed. What happens before the procedure? Medicines Ask your health care provider about: Changing  or stopping your regular medicines. These include any heart rhythm medicines, diabetes medicines, or blood thinners you take. Taking medicines such as aspirin and ibuprofen. These medicines can thin your blood. Do not take them unless your health care provider tells you to. Taking over-the-counter medicines, vitamins, herbs, and supplements. General instructions Follow instructions from your health care provider about what you may eat and drink. If you will be going home right after the procedure, plan to have a responsible adult: Take you home from the hospital or clinic. You will not be allowed to drive. Care for you for the time you are told. Ask your health care provider what steps will be taken to prevent infection. What happens during the procedure?  An IV will be inserted into one of your veins. You may be given: A sedative. This helps you relax. Anesthesia. This will: Numb certain areas of your body. An incision will be made in your neck or your groin. A needle will be inserted through the incision and into a large vein in your neck or groin. The small, thin tube (catheter) will be inserted through the needle and moved to your heart. A type of X-ray (fluoroscopy) will be used to help guide the catheter and provide images of the heart on a monitor. Dye may be injected through the catheter to help your surgeon see the area of the heart that needs treatment. Electrical currents will be sent from the catheter to destroy heart tissue in certain areas. There are three types of energy that may be used to do this: Heat (radiofrequency energy). Laser energy. Extreme cold (cryoablation). When the tissue has been destroyed, the catheter will be removed. Pressure will be held on the insertion area to prevent bleeding. A bandage (dressing) will be placed over the insertion area. The procedure may vary among health care providers and hospitals. What happens after the procedure? Your blood  pressure, heart rate and rhythm, breathing rate, and blood oxygen level will be monitored until you leave the hospital or clinic. Your insertion area will be checked for bleeding. You will need to lie still for a few hours. If your groin was used, you will need to keep your leg straight for a few hours after the catheter is removed. This information is not intended to replace advice given to you by your health care provider. Make sure you discuss any questions you have with your health care provider. Document Revised: 04/07/2022 Document Reviewed: 04/07/2022 Elsevier Patient Education  2024 [employer].

## 2024-03-01 NOTE — Progress Notes (Signed)
 Electrophysiology Office Note:    Date:  03/01/2024   ID:  Briana Craig, DOB [birth year], MRN [id number]  PCP:  Crecencio Dodge, Candida Chalk, DO    HeartCare Providers Cardiologist:  None Electrophysiologist:  Efraim Grange, MD     Referring MD: Crecencio Dodge, Candida Chalk, *   History of Present Illness:    Briana Craig is a 78 y.o. female with a medical history significant for esophageal cancer with chemotherapy and radiotherapy, referred for symptomatic bradycardia.     He presented to her PCP in September with complaints of "buzzing sensation" in her head associate with shortness of breath and lightheadedness.  She noticed that her pulse slows down during these episodes.  A monitor was placed with results detailed below.  In brief there were episodes of complete heart block and second-degree heart block associated with symptoms of dizziness  She presented for pacemaker placement in December 2024.  Unfortunately, this was complicated by a lead dislodgment requiring lead revision which was initially unsuccessful.  Dr. Carolynne Citron during a second lead revision attempt was able to place a RV lead.  After recovery from her pacemaker placement, she was diagnosed with atrial fibrillation detected by her device.  This correlated with severe fatigue and dyspnea.  Patient notes that she has a history of atrial fibrillation during esophageal surgery in the past.      Today, she reports that she is doing well.  She continues to have episodic fatigue.  EKGs/Labs/Other Studies Reviewed Today:     Echocardiogram:  Transthoracic August 17, 2023 EF 60 to 65%.   Monitors:  Zio monitor -14 days November 2024 Wear time: 14 days   Sinus rhythm predominates Sinus HR 44-113 bpm, avg 68   There were frequent (147 events) pauses > 3 seconds due to AV block. These events occurred during the daytime. Symptoms of dizziness and shortness of breath correlated with complete heart  block. The longest pause was 6.3 seconds and occurred at 7:25 AM on 10/25. There were a few brief episodes of ectopic atrial tachycardia. The longest was 4.9 seconds.    EKG:   EKG Interpretation Date/Time:  Wednesday March 01 2024 11:52:59 EDT Ventricular Rate:  66 PR Interval:  178 QRS Duration:  122 QT Interval:  444 QTC Calculation: 465 R Axis:   267  Text Interpretation: Atrial-sensed ventricular-paced rhythm When compared with ECG of 25-Jan-2024 15:04, No significant change since last tracing Confirmed by Marlane Silver [phone] on 03/01/2024 12:43:53 PM     Physical Exam:    VS:  BP (!) 162/80   Pulse 66   Ht 5\' 2"  (1.575 m)   Wt 107 lb (48.5 kg)   SpO2 97%   BMI 19.57 kg/m     Wt Readings from Last 3 Encounters:  03/01/24 107 lb (48.5 kg)  01/25/24 115 lb (52.2 kg)  01/24/24 115 lb (52.2 kg)     GEN:  Well nourished, well developed in no acute distress CARDIAC: RRR, no murmurs, rubs, gallops RESPIRATORY:  Normal work of breathing MUSCULOSKELETAL: no edema    ASSESSMENT & PLAN:     Paroxysmal complete heart block Also episodes of second-degree AV block and high-grade AV block Medtronic dual-chamber pacemaker in place functioning normally She is device dependent today, V paced 93%  Paroxysmal atrial fibrillation Now on carvedilol , with controlled rates She is symptomatic with episodes, very fatigued We discussed management options, and using a shared decision making technique have opted to proceed with ablation.  We discussed the indication, rationale, logistics, anticipated benefits, and potential risks of the ablation procedure including but not limited to -- bleed at the groin access site, chest pain, damage to nearby organs such as the diaphragm, lungs, or esophagus, need for a drainage tube, or prolonged hospitalization. I explained that the risk for stroke, heart attack, need for open chest surgery, or even death is very low but not zero. she   expressed understanding and wishes to proceed.   Secondary hypercoagulable state Continue apixaban  5 mg twice daily  Signed, Gennavieve Huq E Jasilyn Holderman, MD  03/01/2024 1:00 PM    [facility]

## 2024-03-03 NOTE — Progress Notes (Signed)
 Remote pacemaker transmission.

## 2024-03-14 DIAGNOSIS — H353231 Exudative age-related macular degeneration, bilateral, with active choroidal neovascularization: Secondary | ICD-10-CM | POA: Diagnosis not present

## 2024-03-30 ENCOUNTER — Other Ambulatory Visit: Payer: Self-pay | Admitting: Family Medicine

## 2024-03-30 DIAGNOSIS — E785 Hyperlipidemia, unspecified: Secondary | ICD-10-CM

## 2024-04-03 ENCOUNTER — Ambulatory Visit: Payer: Self-pay | Admitting: Emergency Medicine

## 2024-04-17 ENCOUNTER — Ambulatory Visit (INDEPENDENT_AMBULATORY_CARE_PROVIDER_SITE_OTHER): Payer: Medicare PPO

## 2024-04-17 DIAGNOSIS — I480 Paroxysmal atrial fibrillation: Secondary | ICD-10-CM

## 2024-04-17 LAB — CUP PACEART REMOTE DEVICE CHECK
Battery Remaining Longevity: 148 mo
Battery Voltage: 3.18 V
Brady Statistic AP VP Percent: 7.48 %
Brady Statistic AP VS Percent: 0.11 %
Brady Statistic AS VP Percent: 89.93 %
Brady Statistic AS VS Percent: 2.48 %
Brady Statistic RA Percent Paced: 7.65 %
Brady Statistic RV Percent Paced: 97.41 %
Date Time Interrogation Session: 20250615191613
Implantable Lead Connection Status: 753985
Implantable Lead Connection Status: 753985
Implantable Lead Implant Date: 20250128
Implantable Lead Implant Date: 20250128
Implantable Lead Location: 753859
Implantable Lead Location: 753860
Implantable Lead Model: 5076
Implantable Lead Model: 5076
Implantable Pulse Generator Implant Date: 20250128
Lead Channel Impedance Value: 285 Ohm
Lead Channel Impedance Value: 380 Ohm
Lead Channel Impedance Value: 437 Ohm
Lead Channel Impedance Value: 475 Ohm
Lead Channel Pacing Threshold Amplitude: 0.625 V
Lead Channel Pacing Threshold Amplitude: 0.875 V
Lead Channel Pacing Threshold Pulse Width: 0.4 ms
Lead Channel Pacing Threshold Pulse Width: 0.4 ms
Lead Channel Sensing Intrinsic Amplitude: 0.75 mV
Lead Channel Sensing Intrinsic Amplitude: 0.75 mV
Lead Channel Sensing Intrinsic Amplitude: 15.75 mV
Lead Channel Sensing Intrinsic Amplitude: 15.75 mV
Lead Channel Setting Pacing Amplitude: 1.5 V
Lead Channel Setting Pacing Amplitude: 2 V
Lead Channel Setting Pacing Pulse Width: 0.4 ms
Lead Channel Setting Sensing Sensitivity: 1.2 mV
Zone Setting Status: 755011

## 2024-04-18 ENCOUNTER — Ambulatory Visit: Payer: Self-pay | Admitting: Cardiovascular Disease

## 2024-04-20 ENCOUNTER — Other Ambulatory Visit: Payer: Self-pay | Admitting: Family Medicine

## 2024-04-20 DIAGNOSIS — C159 Malignant neoplasm of esophagus, unspecified: Secondary | ICD-10-CM

## 2024-05-04 ENCOUNTER — Telehealth: Payer: Self-pay

## 2024-05-04 ENCOUNTER — Other Ambulatory Visit: Payer: Self-pay

## 2024-05-04 DIAGNOSIS — I4819 Other persistent atrial fibrillation: Secondary | ICD-10-CM

## 2024-05-04 NOTE — Telephone Encounter (Signed)
 Called pt to inform her that she would need to go today to have labs drawn. Her CT is scheduled for Monday 7/7 and we will need lab results prior to her CT.   [facility] will [facility] to [facility] to have this done this morning.   I placed STAT orders so we will be sure to have the results in time.

## 2024-05-05 LAB — BASIC METABOLIC PANEL WITH GFR
BUN/Creatinine Ratio: 16 (ref 12–28)
BUN: 15 mg/dL (ref 8–27)
CO2: 22 mmol/L (ref 20–29)
Calcium: 9.8 mg/dL (ref 8.7–10.3)
Chloride: 100 mmol/L (ref 96–106)
Creatinine, Ser: 0.95 mg/dL (ref 0.57–1.00)
Glucose: 158 mg/dL — ABNORMAL HIGH (ref 70–99)
Potassium: 4.5 mmol/L (ref 3.5–5.2)
Sodium: 140 mmol/L (ref 134–144)
eGFR: 61 mL/min/1.73 (ref >59)

## 2024-05-05 LAB — CBC
Hematocrit: 40.9 % (ref 34.0–46.6)
Hemoglobin: 12.9 g/dL (ref 11.1–15.9)
MCH: 28.9 pg (ref 26.6–33.0)
MCHC: 31.5 g/dL (ref 31.5–35.7)
MCV: 92 fL (ref 79–97)
Platelets: 219 x10E3/uL (ref 150–450)
RBC: 4.47 x10E6/uL (ref 3.77–5.28)
RDW: 15.9 % — ABNORMAL HIGH (ref 11.7–15.4)
WBC: 5.4 x10E3/uL (ref 3.4–10.8)

## 2024-05-08 ENCOUNTER — Ambulatory Visit: Payer: Self-pay

## 2024-05-08 ENCOUNTER — Ambulatory Visit (HOSPITAL_COMMUNITY)
Admission: RE | Admit: 2024-05-08 | Discharge: 2024-05-08 | Disposition: A | Discharge status: Home / Self Care | Source: Ambulatory Visit | Attending: Cardiovascular Disease | Admitting: Cardiovascular Disease

## 2024-05-08 DIAGNOSIS — J90 Pleural effusion, not elsewhere classified: Secondary | ICD-10-CM | POA: Diagnosis not present

## 2024-05-08 DIAGNOSIS — I480 Paroxysmal atrial fibrillation: Secondary | ICD-10-CM

## 2024-05-08 DIAGNOSIS — R918 Other nonspecific abnormal finding of lung field: Secondary | ICD-10-CM | POA: Diagnosis not present

## 2024-05-08 MED ORDER — NITROGLYCERIN 0.4 MG SL SUBL: 0.8000 mg | SUBLINGUAL_TABLET | Freq: Once | SUBLINGUAL | Status: DC

## 2024-05-08 MED ORDER — IOHEXOL 350 MG/ML SOLN
100.0000 mL | Freq: Once | INTRAVENOUS | Status: AC | PRN
  Administered 2024-05-08: 100 mL via INTRAVENOUS

## 2024-05-09 ENCOUNTER — Ambulatory Visit: Payer: Self-pay | Admitting: Cardiovascular Disease

## 2024-05-09 DIAGNOSIS — H353231 Exudative age-related macular degeneration, bilateral, with active choroidal neovascularization: Secondary | ICD-10-CM | POA: Diagnosis not present

## 2024-05-19 ENCOUNTER — Other Ambulatory Visit (HOSPITAL_COMMUNITY): Payer: Self-pay | Admitting: Internal Medicine

## 2024-05-19 DIAGNOSIS — I480 Paroxysmal atrial fibrillation: Secondary | ICD-10-CM

## 2024-05-19 NOTE — Telephone Encounter (Signed)
 Eliquis  5mg  refill request received. Patient is 78 years old, weight-48.5kg, Crea-0.95 on 05/04/24, Diagnosis-Afib, and last seen by Dr. Nancey on 03/01/24. Dose is appropriate based on dosing criteria. Will send in refill to requested pharmacy.

## 2024-05-23 ENCOUNTER — Telehealth (HOSPITAL_COMMUNITY): Payer: Self-pay

## 2024-05-23 NOTE — Telephone Encounter (Signed)
 Spoke with patient to discuss upcoming procedure.   CT: completed.  Labs: completed.   Any recent signs of acute illness or been started on antibiotics? No Any new medications started? No Any medications to hold? No Any missed doses of blood thinner? No Advised patient to continue taking ANTICOAGULANT: Eliquis  (Apixaban ) twice daily without missing any doses.  Medication instructions:  On the morning of your procedure DO NOT take any medication., including Eliquis  or the procedure may be rescheduled. Nothing to eat or drink after midnight prior to your procedure.  Confirmed patient is scheduled for Atrial Fibrillation Ablation on Monday, July 28 with Dr. Eulas Furbish. Instructed patient to arrive at the Main Entrance A at [facility]: [address] [place], [place] [address] and check in at Admitting at 8:00 AM.  Advised of plan to go home the same day and will only stay overnight if medically necessary. You MUST have a responsible adult to drive you home and MUST be with you the first 24 hours after you arrive home or your procedure could be cancelled.  Patient verbalized understanding to all instructions provided and agreed to proceed with procedure.

## 2024-05-25 ENCOUNTER — Telehealth: Payer: Self-pay | Admitting: Cardiovascular Disease

## 2024-05-25 NOTE — Telephone Encounter (Signed)
 Pt calling to know how much her copay is for her upcoming ablation procedure

## 2024-05-25 NOTE — Progress Notes (Signed)
 Remote pacemaker transmission.

## 2024-05-26 NOTE — Pre-Procedure Instructions (Signed)
 Instructed patient on the following items: Arrival time 0800 Nothing to eat or drink after midnight No meds AM of procedure Responsible person to drive you home and stay with you for 24 hrs  Have you missed any doses of anti-coagulant Eliquis- takes twice a day, hasn't missed any doses in last 4 weeks.  Don't take dose morning of procedure.

## 2024-05-29 ENCOUNTER — Ambulatory Visit (HOSPITAL_COMMUNITY)
Admission: RE | Admit: 2024-05-29 | Discharge: 2024-05-29 | Disposition: A | Discharge status: Home / Self Care | Attending: Cardiovascular Disease | Admitting: Cardiovascular Disease

## 2024-05-29 ENCOUNTER — Ambulatory Visit (HOSPITAL_COMMUNITY): Admitting: Anesthesiology

## 2024-05-29 ENCOUNTER — Other Ambulatory Visit: Payer: Self-pay

## 2024-05-29 ENCOUNTER — Ambulatory Visit (HOSPITAL_COMMUNITY)
Admission: RE | Disposition: A | Discharge status: Home / Self Care | Payer: Self-pay | Source: Home / Self Care | Attending: Cardiovascular Disease

## 2024-05-29 ENCOUNTER — Encounter (HOSPITAL_COMMUNITY): Payer: Self-pay | Admitting: Cardiovascular Disease

## 2024-05-29 DIAGNOSIS — Z923 Personal history of irradiation: Secondary | ICD-10-CM | POA: Insufficient documentation

## 2024-05-29 DIAGNOSIS — Z9221 Personal history of antineoplastic chemotherapy: Secondary | ICD-10-CM | POA: Diagnosis not present

## 2024-05-29 DIAGNOSIS — D6869 Other thrombophilia: Secondary | ICD-10-CM | POA: Insufficient documentation

## 2024-05-29 DIAGNOSIS — K219 Gastro-esophageal reflux disease without esophagitis: Secondary | ICD-10-CM | POA: Insufficient documentation

## 2024-05-29 DIAGNOSIS — Z8501 Personal history of malignant neoplasm of esophagus: Secondary | ICD-10-CM | POA: Insufficient documentation

## 2024-05-29 DIAGNOSIS — Z87891 Personal history of nicotine dependence: Secondary | ICD-10-CM | POA: Insufficient documentation

## 2024-05-29 DIAGNOSIS — Z7901 Long term (current) use of anticoagulants: Secondary | ICD-10-CM | POA: Insufficient documentation

## 2024-05-29 DIAGNOSIS — I442 Atrioventricular block, complete: Secondary | ICD-10-CM | POA: Insufficient documentation

## 2024-05-29 DIAGNOSIS — I10 Essential (primary) hypertension: Secondary | ICD-10-CM | POA: Insufficient documentation

## 2024-05-29 DIAGNOSIS — I480 Paroxysmal atrial fibrillation: Secondary | ICD-10-CM

## 2024-05-29 DIAGNOSIS — Z79899 Other long term (current) drug therapy: Secondary | ICD-10-CM | POA: Insufficient documentation

## 2024-05-29 DIAGNOSIS — E1151 Type 2 diabetes mellitus with diabetic peripheral angiopathy without gangrene: Secondary | ICD-10-CM | POA: Diagnosis not present

## 2024-05-29 DIAGNOSIS — Z950 Presence of cardiac pacemaker: Secondary | ICD-10-CM | POA: Insufficient documentation

## 2024-05-29 LAB — GLUCOSE, CAPILLARY: Glucose-Capillary: 127 mg/dL — ABNORMAL HIGH (ref 70–99)

## 2024-05-29 LAB — POCT ACTIVATED CLOTTING TIME: Activated Clotting Time: 274 s

## 2024-05-29 MED ORDER — LIDOCAINE 2% (20 MG/ML) 5 ML SYRINGE
INTRAMUSCULAR | Status: DC | PRN
  Administered 2024-05-29: 60 mg via INTRAVENOUS

## 2024-05-29 MED ORDER — FENTANYL CITRATE (PF) 100 MCG/2ML IJ SOLN: 25.0000 ug | INTRAMUSCULAR | Status: DC | PRN

## 2024-05-29 MED ORDER — PROTAMINE SULFATE 10 MG/ML IV SOLN
INTRAVENOUS | Status: DC | PRN
  Administered 2024-05-29: 50 mg via INTRAVENOUS

## 2024-05-29 MED ORDER — ACETAMINOPHEN 325 MG PO TABS: 650.0000 mg | ORAL_TABLET | ORAL | Status: DC | PRN

## 2024-05-29 MED ORDER — FENTANYL CITRATE (PF) 250 MCG/5ML IJ SOLN
INTRAMUSCULAR | Status: DC | PRN
  Administered 2024-05-29: 100 ug via INTRAVENOUS

## 2024-05-29 MED ORDER — ROCURONIUM BROMIDE 10 MG/ML (PF) SYRINGE
PREFILLED_SYRINGE | INTRAVENOUS | Status: DC | PRN
  Administered 2024-05-29: 50 mg via INTRAVENOUS
  Administered 2024-05-29: 10 mg via INTRAVENOUS

## 2024-05-29 MED ORDER — FENTANYL CITRATE (PF) 100 MCG/2ML IJ SOLN
INTRAMUSCULAR | Status: AC
  Filled 2024-05-29: qty 2

## 2024-05-29 MED ORDER — CEFAZOLIN SODIUM-DEXTROSE 2-4 GM/100ML-% IV SOLN
INTRAVENOUS | Status: AC
  Filled 2024-05-29: qty 100

## 2024-05-29 MED ORDER — ONDANSETRON HCL 4 MG/2ML IJ SOLN
INTRAMUSCULAR | Status: DC | PRN
  Administered 2024-05-29: 4 mg via INTRAVENOUS

## 2024-05-29 MED ORDER — SODIUM CHLORIDE 0.9% FLUSH: 3.0000 mL | INTRAVENOUS | Status: DC | PRN

## 2024-05-29 MED ORDER — PHENYLEPHRINE 80 MCG/ML (10ML) SYRINGE FOR IV PUSH (FOR BLOOD PRESSURE SUPPORT)
PREFILLED_SYRINGE | INTRAVENOUS | Status: DC | PRN
Start: 2024-05-29 — End: 2024-05-29
  Administered 2024-05-29 (×2): 80 ug via INTRAVENOUS

## 2024-05-29 MED ORDER — HEPARIN (PORCINE) IN NACL 1000-0.9 UT/500ML-% IV SOLN
INTRAVENOUS | Status: DC | PRN
  Administered 2024-05-29 (×3): 500 mL

## 2024-05-29 MED ORDER — CEFAZOLIN SODIUM-DEXTROSE 2-3 GM-%(50ML) IV SOLR
INTRAVENOUS | Status: DC | PRN
Start: 2024-05-29 — End: 2024-05-29
  Administered 2024-05-29: 2 g via INTRAVENOUS

## 2024-05-29 MED ORDER — ONDANSETRON HCL 4 MG/2ML IJ SOLN: 4.0000 mg | Freq: Four times a day (QID) | INTRAMUSCULAR | Status: DC | PRN

## 2024-05-29 MED ORDER — SODIUM CHLORIDE 0.9 % IV SOLN: INTRAVENOUS | Status: DC

## 2024-05-29 MED ORDER — HEPARIN SODIUM (PORCINE) 1000 UNIT/ML IJ SOLN
INTRAMUSCULAR | Status: DC | PRN
  Administered 2024-05-29: 8000 [IU] via INTRAVENOUS

## 2024-05-29 MED ORDER — PROPOFOL 10 MG/ML IV BOLUS
INTRAVENOUS | Status: DC | PRN
  Administered 2024-05-29: 80 mg via INTRAVENOUS

## 2024-05-29 MED ORDER — ATROPINE SULFATE 1 MG/10ML IJ SOSY
PREFILLED_SYRINGE | INTRAMUSCULAR | Status: DC | PRN
  Administered 2024-05-29: 1 mg via INTRAVENOUS

## 2024-05-29 MED ORDER — SODIUM CHLORIDE 0.9 % IV SOLN: 250.0000 mL | INTRAVENOUS | Status: DC | PRN

## 2024-05-29 MED ORDER — SUGAMMADEX SODIUM 200 MG/2ML IV SOLN
INTRAVENOUS | Status: DC | PRN
  Administered 2024-05-29: 200 mg via INTRAVENOUS

## 2024-05-29 NOTE — Transfer of Care (Signed)
 Immediate Anesthesia Transfer of Care Note  Patient: Briana Craig  Procedure(s) Performed: ATRIAL FIBRILLATION ABLATION  Patient Location: Cath Lab  Anesthesia Type:General  Level of Consciousness: drowsy and patient cooperative  Airway & Oxygen Therapy: Patient Spontanous Breathing  Post-op Assessment: Report given to RN and Post -op Vital signs reviewed and stable  Post vital signs: Reviewed and stable  Last Vitals:  Vitals Value Taken Time  BP 120/81 05/29/24 11:22  Temp    Pulse 69 05/29/24 11:23  Resp 15 05/29/24 11:23  SpO2 92 % 05/29/24 11:23  Vitals shown include unfiled device data.  Last Pain: There were no vitals filed for this visit.       Complications: There were no known notable events for this encounter.

## 2024-05-29 NOTE — Progress Notes (Signed)
 Patient ambulated and dressed. Waiting on MD to come to bedside for patient to discharge.

## 2024-05-29 NOTE — Anesthesia Procedure Notes (Signed)
 Procedure Name: Intubation Date/Time: 05/29/2024 9:53 AM  Performed by: Marva Lonni PARAS, CRNAPre-anesthesia Checklist: Patient identified, Emergency Drugs available, Suction available and Patient being monitored Patient Re-evaluated:Patient Re-evaluated prior to induction Oxygen Delivery Method: Circle System Utilized Preoxygenation: Pre-oxygenation with 100% oxygen Induction Type: IV induction Ventilation: Mask ventilation without difficulty Laryngoscope Size: Mac and 3 Grade View: Grade I Tube type: Oral Tube size: 7.0 mm Number of attempts: 1 Airway Equipment and Method: Stylet Placement Confirmation: ETT inserted through vocal cords under direct vision, positive ETCO2 and breath sounds checked- equal and bilateral Secured at: 21 cm Tube secured with: Tape Dental Injury: Teeth and Oropharynx as per pre-operative assessment

## 2024-05-29 NOTE — Progress Notes (Signed)
Patient and sister was given discharge instructions. Both verbalized understanding. 

## 2024-05-29 NOTE — Anesthesia Postprocedure Evaluation (Signed)
 Anesthesia Post Note  Patient: Briana Craig  Procedure(s) Performed: ATRIAL FIBRILLATION ABLATION     Patient location during evaluation: PACU Anesthesia Type: General Level of consciousness: awake and alert Pain management: pain level controlled Vital Signs Assessment: post-procedure vital signs reviewed and stable Respiratory status: spontaneous breathing, nonlabored ventilation, respiratory function stable and patient connected to nasal cannula oxygen Cardiovascular status: blood pressure returned to baseline and stable Postop Assessment: no apparent nausea or vomiting Anesthetic complications: no   There were no known notable events for this encounter.  Last Vitals:  Vitals:   05/29/24 1215 05/29/24 1230  BP: 134/83 127/82  Pulse: 73 73  Resp: 18 20  Temp:    SpO2: 96% 94%    Last Pain:  Vitals:   05/29/24 1122  PainSc: 0-No pain   Pain Goal:                   Thom JONELLE Peoples

## 2024-05-29 NOTE — Anesthesia Preprocedure Evaluation (Addendum)
 Anesthesia Evaluation    Reviewed: Allergy & Precautions, Patient's Chart, lab work & pertinent test results, reviewed documented beta blocker date and time   History of Anesthesia Complications (+) PONV and history of anesthetic complications  Airway Mallampati: II  TM Distance: >3 FB Neck ROM: Full    Dental no notable dental hx.    Pulmonary former smoker   Pulmonary exam normal breath sounds clear to auscultation       Cardiovascular hypertension, Pt. on medications and Pt. on home beta blockers + Peripheral Vascular Disease  Normal cardiovascular exam+ dysrhythmias Atrial Fibrillation + pacemaker (3rd degree AVB)  Rhythm:Regular Rate:Normal   TTE 08/17/23: EF 60-65%, mild AR   Neuro/Psych negative neurological ROS     GI/Hepatic Neg liver ROS,GERD  Medicated,,  Endo/Other  diabetes    Renal/GU negative Renal ROS     Musculoskeletal negative musculoskeletal ROS (+)    Abdominal   Peds  Hematology negative hematology ROS (+)   Anesthesia Other Findings Day of surgery medications reviewed with patient.  Reproductive/Obstetrics                              Anesthesia Physical Anesthesia Plan  ASA: 2  Anesthesia Plan: General   Post-op Pain Management: Minimal or no pain anticipated   Induction: Intravenous  PONV Risk Score and Plan: 4 or greater and Treatment may vary due to age or medical condition, Ondansetron  and Dexamethasone   Airway Management Planned: Oral ETT  Additional Equipment: None  Intra-op Plan:   Post-operative Plan: Extubation in OR  Informed Consent:   Plan Discussed with:   Anesthesia Plan Comments:          Anesthesia Quick Evaluation

## 2024-05-29 NOTE — H&P (Signed)
 Electrophysiology Office Note:    Date:  05/29/2024   ID:  Briana Craig, DOB 01/04/1946, MRN [id number]  PCP:  Antonio Meth, Jamee SAUNDERS, DO   [facility] Providers Cardiologist:  None Electrophysiologist:  Eulas FORBES Furbish, MD     Referring MD: No ref. provider found   History of Present Illness:    Briana Craig is a 78 y.o. female with a medical history significant for esophageal cancer with chemotherapy and radiotherapy, referred for symptomatic bradycardia.     He presented to her PCP in September with complaints of buzzing sensation in her head associate with shortness of breath and lightheadedness.  She noticed that her pulse slows down during these episodes.  A monitor was placed with results detailed below.  In brief there were episodes of complete heart block and second-degree heart block associated with symptoms of dizziness  She presented for pacemaker placement in December 2024.  Unfortunately, this was complicated by a lead dislodgment requiring lead revision which was initially unsuccessful.  Dr. Waddell during a second lead revision attempt was able to place a RV lead.  After recovery from her pacemaker placement, she was diagnosed with atrial fibrillation detected by her device.  This correlated with severe fatigue and dyspnea.  Patient notes that she has a history of atrial fibrillation during esophageal surgery in the past.      Today, she reports that she is doing well.  She continues to have episodic fatigue.  I reviewed the patient's CT and labs. There was no LAA thrombus. she  has not missed any doses of anticoagulation, and she took her dose last night. There have been no changes in the patient's diagnoses, medications, or condition since our recent clinic visit.   EKGs/Labs/Other Studies Reviewed Today:     Echocardiogram:  Transthoracic August 17, 2023 EF 60 to 65%.   Monitors:  Zio monitor -14 days November 2024 Wear time: 14  days   Sinus rhythm predominates Sinus HR 44-113 bpm, avg 68   There were frequent (147 events) pauses > 3 seconds due to AV block. These events occurred during the daytime. Symptoms of dizziness and shortness of breath correlated with complete heart block. The longest pause was 6.3 seconds and occurred at 7:25 AM on 10/25. There were a few brief episodes of ectopic atrial tachycardia. The longest was 4.9 seconds.    EKG:         Physical Exam:    VS:  BP (!) 167/82   Pulse 66   Temp 98.2 F (36.8 C)   Resp 16   Ht 5' 0.2 (1.529 m)   Wt 48.5 kg   SpO2 97%   BMI 20.76 kg/m     Wt Readings from Last 3 Encounters:  05/29/24 48.5 kg  03/01/24 48.5 kg  01/25/24 52.2 kg     GEN:  Well nourished, well developed in no acute distress CARDIAC: RRR, no murmurs, rubs, gallops RESPIRATORY:  Normal work of breathing MUSCULOSKELETAL: no edema    ASSESSMENT & PLAN:     Paroxysmal complete heart block Also episodes of second-degree AV block and high-grade AV block Medtronic dual-chamber pacemaker in place functioning normally She is device dependent today, V paced 93%  Paroxysmal atrial fibrillation Now on carvedilol , with controlled rates She is symptomatic with episodes, very fatigued We discussed management options, and using a shared decision making technique have opted to proceed with ablation.  We discussed the indication, rationale, logistics, anticipated benefits, and potential  risks of the ablation procedure including but not limited to -- bleed at the groin access site, chest pain, damage to nearby organs such as the diaphragm, lungs, or esophagus, need for a drainage tube, or prolonged hospitalization. I explained that the risk for stroke, heart attack, need for open chest surgery, or even death is very low but not zero. she  expressed understanding and wishes to proceed.   Secondary hypercoagulable state Continue apixaban  5 mg twice daily  Signed, Eulas FORBES Furbish, MD  05/29/2024 9:11 AM    [facility]

## 2024-05-29 NOTE — Discharge Instructions (Signed)

## 2024-05-30 ENCOUNTER — Telehealth (HOSPITAL_COMMUNITY): Payer: Self-pay

## 2024-05-30 MED FILL — Fentanyl Citrate Preservative Free (PF) Inj 100 MCG/2ML: INTRAMUSCULAR | Qty: 2 | Status: AC

## 2024-05-30 NOTE — Telephone Encounter (Signed)
 Spoke with patient to complete post procedure follow up call.  Patient reports no complications with groin sites.   Instructions reviewed with patient:  Remove large bandage at puncture site after 24 hours. It is normal to have bruising, tenderness, mild swelling, and a pea or marble sized lump/knot at the groin site which can take up to three months to resolve.  Get help right away if you notice sudden swelling at the puncture site.  Check your puncture site every day for signs of infection: fever, redness, swelling, pus drainage, warmth, foul odor or excessive pain. If this occurs, please call the office at [phone], to speak with the nurse. Get help right away if your puncture site is bleeding and the bleeding does not stop after applying firm pressure to the area.  You may continue to have skipped beats/ atrial fibrillation during the first several months after your procedure.  It is very important not to miss any doses of your blood thinner Eliquis .    You will follow up with the Afib clinic on 06/26/24 and follow up with the APP on 08/28/24.   Patient verbalized understanding to all instructions provided.

## 2024-06-01 ENCOUNTER — Other Ambulatory Visit: Payer: Self-pay | Admitting: Family Medicine

## 2024-06-01 DIAGNOSIS — K219 Gastro-esophageal reflux disease without esophagitis: Secondary | ICD-10-CM

## 2024-06-05 ENCOUNTER — Telehealth: Payer: Self-pay | Admitting: Cardiovascular Disease

## 2024-06-05 NOTE — Telephone Encounter (Signed)
 Spoke with the patient who states that she is having discoloration at her groin site that has spread to around her pelvis bone. She states that it is a dark purple in some areas. She denies any pain, redness, swelling, or drainage. She states that she had a marble sized knot the day after but this is now gone. Advised patient that bruising around the site is normal. She will continue to monitor and call back with any further concerns.

## 2024-06-05 NOTE — Telephone Encounter (Signed)
 Patient called stating she had an ablation done a week ago, she has some discoloration at the site that she is concerned about.  She said the discoloration first started showing up on Thursday and it seams it be getting more. She is on eliquis .

## 2024-06-08 ENCOUNTER — Other Ambulatory Visit (HOSPITAL_COMMUNITY): Payer: Self-pay | Admitting: *Deleted

## 2024-06-08 MED ORDER — CARVEDILOL 12.5 MG PO TABS: ORAL_TABLET | ORAL | 6 refills | Status: AC

## 2024-06-12 ENCOUNTER — Encounter: Payer: Self-pay | Admitting: Emergency Medicine

## 2024-06-26 ENCOUNTER — Ambulatory Visit (HOSPITAL_COMMUNITY)
Admission: RE | Admit: 2024-06-26 | Discharge: 2024-06-26 | Disposition: A | Discharge status: Home / Self Care | Source: Ambulatory Visit | Attending: Internal Medicine | Admitting: Internal Medicine

## 2024-06-26 VITALS — BP 152/88 | HR 69 | Ht 60.2 in | Wt 111.8 lb

## 2024-06-26 DIAGNOSIS — D6869 Other thrombophilia: Secondary | ICD-10-CM

## 2024-06-26 DIAGNOSIS — I4891 Unspecified atrial fibrillation: Secondary | ICD-10-CM | POA: Diagnosis not present

## 2024-06-26 DIAGNOSIS — I480 Paroxysmal atrial fibrillation: Secondary | ICD-10-CM | POA: Diagnosis not present

## 2024-06-26 NOTE — Progress Notes (Signed)
 Primary Care Physician: Antonio Meth, Briana SAUNDERS, DO Primary Cardiologist: None Electrophysiologist: Dr. Nancey    Referring Physician: Device clinic     Briana Craig is a 78 y.o. female with a history of HLD, T2DM, HTN, esophageal cancer, symptomatic bradycardia with paroxysmal CHB s/p PPM 09/24/23, lead revision, and paroxysmal atrial fibrillation who presents for consultation in the [facility]. Device clinic alert today due to new Afib with controlled HR and longest duration almost 15 hours. Patient is not on OAC. She has a CHADS2VASC score of 5.  On follow up 06/26/24, patient is currently in AV paced rhythm. S/p Afib ablation on 05/29/24 by Dr. Nancey. No episodes of Afib since ablation. No chest pain or SOB. Leg sites healed without issue. No missed doses of anticoagulant.  Today, she denies symptoms of orthopnea, PND, lower extremity edema, dizziness, presyncope, syncope, snoring, daytime somnolence, bleeding, or neurologic sequela. The patient is tolerating medications without difficulties and is otherwise without complaint today.    she has a BMI of Body mass index is 21.69 kg/m.SABRA Filed Weights   06/26/24 1135  Weight: 50.7 kg     Current Outpatient Medications  Medication Sig Dispense Refill   Accu-Chek Softclix Lancets lancets Use to check blood sugar once a day.  DX  E11.9 100 each 1   Alcohol  Swabs  (ALCOHOL  WIPES) 70 % PADS To use to check sugars 100 each 1   apixaban  (ELIQUIS ) 5 MG TABS tablet TAKE 1 TABLET(5 MG) BY MOUTH TWICE DAILY 60 tablet 5   Blood Glucose Calibration (ACCU-CHEK AVIVA) SOLN Use to check control when opening a new bottle of strips.  DX E11.9 1 each 1   Blood Glucose Monitoring Suppl (ACCU-CHEK AVIVA PLUS) w/Device KIT Use to check blood sugar once a day.  DX E11.9 1 kit 0   Blood Glucose Monitoring Suppl (TRUE METRIX AIR GLUCOSE METER) DEVI Use as directed to check glucose twice daily. E11.9 1 each 0   Blood Glucose  Monitoring Suppl DEVI 1 each by Does not apply route in the morning, at noon, and at bedtime. May substitute to any manufacturer covered by patient's insurance. 1 each 0   carvedilol  (COREG ) 12.5 MG tablet Take 25mg  (2 tablets) in the morning and 12.5mg  (1 tablet) in the evening 90 tablet 6   cetirizine (ZYRTEC) 10 MG tablet Take 10 mg by mouth at bedtime as needed for allergies.     Coenzyme Q10 (CO Q 10 PO) Take 1 capsule by mouth at bedtime. Gel capsule     famotidine  (PEPCID ) 40 MG tablet TAKE 1 TABLET AT BEDTIME 90 tablet 1   Glucos-Chondroit-Hyaluron-MSM (GLUCOSAMINE CHONDROITIN JOINT PO) Take 1 tablet by mouth daily.     glucose blood (ACCU-CHEK AVIVA PLUS) test strip Use to check blood sugar once daily.  DX  E 11.9 100 strip 1   KRILL OIL PO Take 1 capsule by mouth daily.     latanoprost  (XALATAN ) 0.005 % ophthalmic solution Place 1 drop into both eyes at bedtime.     Multiple Vitamins-Minerals (MULTIVITAMIN PO) Take 1 tablet by mouth daily. Centrum silver 50+     Multiple Vitamins-Minerals (PRESERVISION AREDS 2 PO) Take 1 tablet by mouth in the morning and at bedtime.     pantoprazole  (PROTONIX ) 40 MG tablet Take 1 tablet (40 mg total) by mouth daily. 90 tablet 0   simvastatin  (ZOCOR ) 20 MG tablet Take 1 tablet (20 mg total) by mouth daily at 6 PM. 90  tablet 0   TRUEplus Lancets 33G MISC Use as directed. Check glucose up to twice daily. E11.9 100 each 11   No current facility-administered medications for this encounter.   Facility-Administered Medications Ordered in Other Encounters  Medication Dose Route Frequency Provider Last Rate Last Admin   iohexol  (OMNIPAQUE ) 350 MG/ML injection    PRN Camnitz, Will Gladis, MD   30 mL at 11/26/23 1800    Atrial Fibrillation Management history:  Previous antiarrhythmic drugs: none Previous cardioversions: none Previous ablations: 05/29/24 Anticoagulation history: none   ROS- All systems are reviewed and negative except as per the HPI  above.  Physical Exam: BP (!) 152/88   Pulse 69   Ht 5' 0.2 (1.529 m)   Wt 50.7 kg   BMI 21.69 kg/m   GEN- The patient is well appearing, alert and oriented x 3 today.   Neck - no JVD or carotid bruit noted Lungs- Clear to ausculation bilaterally, normal work of breathing Heart- Regular rate and rhythm, no murmurs, rubs or gallops, PMI not laterally displaced Extremities- no clubbing, cyanosis, or edema Skin - no rash or ecchymosis noted   EKG today demonstrates  Vent. rate 69 BPM PR interval 182 ms QRS duration 126 ms QT/QTcB 400/428 ms P-R-T axes 64 270 63 Atrial-sensed ventricular-paced rhythm Abnormal ECG When compared with ECG of 29-May-2024 11:26, No significant change was found  Echo 08/17/23 demonstrated   1. Left ventricular ejection fraction, by estimation, is 60 to 65%. The  left ventricle has normal function. Left ventricular endocardial border  not optimally defined to evaluate regional wall motion. Wall motion  grossly norma. Left ventricular diastolic   parameters were normal.   2. Right ventricular systolic function is normal. The right ventricular  size is normal. There is normal pulmonary artery systolic pressure. The  estimated right ventricular systolic pressure is 33.5 mmHg.   3. The mitral valve is grossly normal. Trivial mitral valve  regurgitation. No evidence of mitral stenosis.   4. The aortic valve is tricuspid. Aortic valve regurgitation is mild. No  aortic stenosis is present.   5. The inferior vena cava is normal in size with greater than 50%  respiratory variability, suggesting right atrial pressure of 3 mmHg.    ASSESSMENT & PLAN CHA2DS2-VASc Score = 5  The patient's score is based upon: CHF History: 0 HTN History: 1 Diabetes History: 1 Stroke History: 0 Vascular Disease History: 0 Age Score: 2 Gender Score: 1       ASSESSMENT AND PLAN: Paroxysmal Atrial Fibrillation (ICD10:  I48.0) The patient's CHA2DS2-VASc score is 5,  indicating a 7.2% annual risk of stroke.   S/p Afib ablation on 05/29/24 by Dr. Nancey.  Patient is currently in AV paced rhythm. Continue coreg  25 mg AM 12.5 mg PM.   Secondary Hypercoagulable State (ICD10:  D68.69) The patient is at significant risk for stroke/thromboembolism based upon her CHA2DS2-VASc Score of 5.  Apixaban  (Eliquis ).  Continue Eliquis .     Follow up with EP as scheduled.    Terra Pac, PA-C  Afib Clinic [facility] [address] [place], [place] [address] [phone]

## 2024-07-07 ENCOUNTER — Other Ambulatory Visit: Payer: Self-pay | Admitting: Family Medicine

## 2024-07-07 DIAGNOSIS — E785 Hyperlipidemia, unspecified: Secondary | ICD-10-CM

## 2024-07-11 DIAGNOSIS — H43813 Vitreous degeneration, bilateral: Secondary | ICD-10-CM | POA: Diagnosis not present

## 2024-07-11 DIAGNOSIS — Z961 Presence of intraocular lens: Secondary | ICD-10-CM | POA: Diagnosis not present

## 2024-07-11 DIAGNOSIS — H353231 Exudative age-related macular degeneration, bilateral, with active choroidal neovascularization: Secondary | ICD-10-CM | POA: Diagnosis not present

## 2024-07-11 DIAGNOSIS — H353211 Exudative age-related macular degeneration, right eye, with active choroidal neovascularization: Secondary | ICD-10-CM | POA: Diagnosis not present

## 2024-07-11 DIAGNOSIS — H35373 Puckering of macula, bilateral: Secondary | ICD-10-CM | POA: Diagnosis not present

## 2024-07-11 DIAGNOSIS — H401132 Primary open-angle glaucoma, bilateral, moderate stage: Secondary | ICD-10-CM | POA: Diagnosis not present

## 2024-07-11 DIAGNOSIS — H353221 Exudative age-related macular degeneration, left eye, with active choroidal neovascularization: Secondary | ICD-10-CM | POA: Diagnosis not present

## 2024-07-12 ENCOUNTER — Other Ambulatory Visit: Payer: Self-pay

## 2024-07-12 ENCOUNTER — Telehealth: Payer: Self-pay | Admitting: Family Medicine

## 2024-07-12 DIAGNOSIS — E1169 Type 2 diabetes mellitus with other specified complication: Secondary | ICD-10-CM

## 2024-07-12 DIAGNOSIS — E119 Type 2 diabetes mellitus without complications: Secondary | ICD-10-CM

## 2024-07-12 NOTE — Telephone Encounter (Signed)
 Does this pt need lab orders

## 2024-07-13 ENCOUNTER — Other Ambulatory Visit (INDEPENDENT_AMBULATORY_CARE_PROVIDER_SITE_OTHER)

## 2024-07-13 DIAGNOSIS — E119 Type 2 diabetes mellitus without complications: Secondary | ICD-10-CM

## 2024-07-13 DIAGNOSIS — E1169 Type 2 diabetes mellitus with other specified complication: Secondary | ICD-10-CM | POA: Diagnosis not present

## 2024-07-13 DIAGNOSIS — E785 Hyperlipidemia, unspecified: Secondary | ICD-10-CM

## 2024-07-13 LAB — MICROALBUMIN / CREATININE URINE RATIO
Creatinine,U: 77.1 mg/dL
Microalb Creat Ratio: 15 mg/g (ref 0.0–30.0)
Microalb, Ur: 1.2 mg/dL (ref 0.0–1.9)

## 2024-07-13 LAB — CBC WITH DIFFERENTIAL/PLATELET
Basophils Absolute: 0 K/uL (ref 0.0–0.1)
Basophils Relative: 0.4 % (ref 0.0–3.0)
Eosinophils Absolute: 0.2 K/uL (ref 0.0–0.7)
Eosinophils Relative: 4.7 % (ref 0.0–5.0)
HCT: 35.5 % — ABNORMAL LOW (ref 36.0–46.0)
Hemoglobin: 11.8 g/dL — ABNORMAL LOW (ref 12.0–15.0)
Lymphocytes Relative: 23.6 % (ref 12.0–46.0)
Lymphs Abs: 1.2 K/uL (ref 0.7–4.0)
MCHC: 33.1 g/dL (ref 30.0–36.0)
MCV: 91.2 fl (ref 78.0–100.0)
Monocytes Absolute: 0.7 K/uL (ref 0.1–1.0)
Monocytes Relative: 13.8 % — ABNORMAL HIGH (ref 3.0–12.0)
Neutro Abs: 3 K/uL (ref 1.4–7.7)
Neutrophils Relative %: 57.5 % (ref 43.0–77.0)
Platelets: 219 K/uL (ref 150.0–400.0)
RBC: 3.9 Mil/uL (ref 3.87–5.11)
RDW: 15.5 % (ref 11.5–15.5)
WBC: 5.3 K/uL (ref 4.0–10.5)

## 2024-07-13 LAB — COMPREHENSIVE METABOLIC PANEL WITH GFR
ALT: 17 U/L (ref 0–35)
AST: 22 U/L (ref 0–37)
Albumin: 4.1 g/dL (ref 3.5–5.2)
Alkaline Phosphatase: 52 U/L (ref 39–117)
BUN: 15 mg/dL (ref 6–23)
CO2: 28 meq/L (ref 19–32)
Calcium: 9.6 mg/dL (ref 8.4–10.5)
Chloride: 104 meq/L (ref 96–112)
Creatinine, Ser: 0.94 mg/dL (ref 0.40–1.20)
GFR: 57.61 mL/min — ABNORMAL LOW (ref >60.00)
Glucose, Bld: 121 mg/dL — ABNORMAL HIGH (ref 70–99)
Potassium: 4.4 meq/L (ref 3.5–5.1)
Sodium: 140 meq/L (ref 135–145)
Total Bilirubin: 0.6 mg/dL (ref 0.2–1.2)
Total Protein: 7.2 g/dL (ref 6.0–8.3)

## 2024-07-13 LAB — LIPID PANEL
Cholesterol: 181 mg/dL (ref <200)
HDL: 78 mg/dL (ref >=50)
LDL Cholesterol (Calc): 83 mg/dL
Non-HDL Cholesterol (Calc): 103 mg/dL (ref <130)
Total CHOL/HDL Ratio: 2.3 (calc) (ref <5.0)
Triglycerides: 108 mg/dL (ref <150)

## 2024-07-13 LAB — HEMOGLOBIN A1C: Hgb A1c MFr Bld: 7.2 % — ABNORMAL HIGH (ref 4.6–6.5)

## 2024-07-16 ENCOUNTER — Other Ambulatory Visit: Payer: Self-pay | Admitting: Family Medicine

## 2024-07-16 DIAGNOSIS — C159 Malignant neoplasm of esophagus, unspecified: Secondary | ICD-10-CM

## 2024-07-17 ENCOUNTER — Ambulatory Visit (INDEPENDENT_AMBULATORY_CARE_PROVIDER_SITE_OTHER): Payer: Medicare PPO

## 2024-07-17 DIAGNOSIS — I480 Paroxysmal atrial fibrillation: Secondary | ICD-10-CM

## 2024-07-18 LAB — CUP PACEART REMOTE DEVICE CHECK
Battery Remaining Longevity: 145 mo
Battery Voltage: 3.14 V
Brady Statistic AP VP Percent: 4.01 %
Brady Statistic AP VS Percent: 0 %
Brady Statistic AS VP Percent: 95.97 %
Brady Statistic AS VS Percent: 0.02 %
Brady Statistic RA Percent Paced: 3.99 %
Brady Statistic RV Percent Paced: 99.98 %
Date Time Interrogation Session: 20250914205336
Implantable Lead Connection Status: 753985
Implantable Lead Connection Status: 753985
Implantable Lead Implant Date: 20250128
Implantable Lead Implant Date: 20250128
Implantable Lead Location: 753859
Implantable Lead Location: 753860
Implantable Lead Model: 5076
Implantable Lead Model: 5076
Implantable Pulse Generator Implant Date: 20250128
Lead Channel Impedance Value: 304 Ohm
Lead Channel Impedance Value: 399 Ohm
Lead Channel Impedance Value: 399 Ohm
Lead Channel Impedance Value: 456 Ohm
Lead Channel Pacing Threshold Amplitude: 0.5 V
Lead Channel Pacing Threshold Amplitude: 0.75 V
Lead Channel Pacing Threshold Pulse Width: 0.4 ms
Lead Channel Pacing Threshold Pulse Width: 0.4 ms
Lead Channel Sensing Intrinsic Amplitude: 1.25 mV
Lead Channel Sensing Intrinsic Amplitude: 1.25 mV
Lead Channel Sensing Intrinsic Amplitude: 15.75 mV
Lead Channel Sensing Intrinsic Amplitude: 15.75 mV
Lead Channel Setting Pacing Amplitude: 1.5 V
Lead Channel Setting Pacing Amplitude: 2 V
Lead Channel Setting Pacing Pulse Width: 0.4 ms
Lead Channel Setting Sensing Sensitivity: 1.2 mV
Zone Setting Status: 755011

## 2024-07-20 ENCOUNTER — Ambulatory Visit: Payer: Self-pay | Admitting: Family Medicine

## 2024-07-20 ENCOUNTER — Ambulatory Visit: Admitting: Family Medicine

## 2024-07-20 ENCOUNTER — Encounter: Payer: Self-pay | Admitting: Family Medicine

## 2024-07-20 VITALS — BP 140/88 | HR 68 | Temp 98.0°F | Resp 18 | Ht 60.2 in | Wt 113.4 lb

## 2024-07-20 DIAGNOSIS — R002 Palpitations: Secondary | ICD-10-CM

## 2024-07-20 DIAGNOSIS — Z95811 Presence of heart assist device: Secondary | ICD-10-CM

## 2024-07-20 DIAGNOSIS — I480 Paroxysmal atrial fibrillation: Secondary | ICD-10-CM

## 2024-07-20 DIAGNOSIS — C50911 Malignant neoplasm of unspecified site of right female breast: Secondary | ICD-10-CM | POA: Diagnosis not present

## 2024-07-20 DIAGNOSIS — E785 Hyperlipidemia, unspecified: Secondary | ICD-10-CM

## 2024-07-20 DIAGNOSIS — D0511 Intraductal carcinoma in situ of right breast: Secondary | ICD-10-CM

## 2024-07-20 DIAGNOSIS — Z23 Encounter for immunization: Secondary | ICD-10-CM

## 2024-07-20 DIAGNOSIS — K219 Gastro-esophageal reflux disease without esophagitis: Secondary | ICD-10-CM

## 2024-07-20 DIAGNOSIS — E119 Type 2 diabetes mellitus without complications: Secondary | ICD-10-CM

## 2024-07-20 DIAGNOSIS — E1169 Type 2 diabetes mellitus with other specified complication: Secondary | ICD-10-CM

## 2024-07-20 DIAGNOSIS — C50011 Malignant neoplasm of nipple and areola, right female breast: Secondary | ICD-10-CM | POA: Insufficient documentation

## 2024-07-20 MED ORDER — SIMVASTATIN 20 MG PO TABS: 20.0000 mg | ORAL_TABLET | Freq: Every day | ORAL | 1 refills | Status: DC

## 2024-07-20 NOTE — Assessment & Plan Note (Signed)
 hgba1c to be checked, minimize simple carbs. Increase exercise as tolerated. Continue current meds

## 2024-07-20 NOTE — Assessment & Plan Note (Signed)
 resolved

## 2024-07-20 NOTE — Assessment & Plan Note (Addendum)
 Per cardiology--- pt has pacemaker

## 2024-07-20 NOTE — Progress Notes (Addendum)
 Subjective:    Patient ID: Briana Craig, female    DOB: Jul 24, 1946, 78 y.o.   MRN: [id number]  Chief Complaint  Patient presents with   Hyperlipidemia   Follow-up    HPI Patient is in today for f/u chol, dm , htn.   Discussed the use of AI scribe software for clinical note transcription with the patient, who gave verbal consent to proceed.  History of Present Illness Briana Craig is a 78 year old female with atrial fibrillation who presents for follow-up after cardiac ablation.  She recently underwent a cardiac ablation procedure, and her initial follow-up showed no signs of atrial fibrillation. A comprehensive check is scheduled for October 27th to confirm the absence of atrial fibrillation, which may allow discontinuation of Eliquis . She recalls experiencing atrial fibrillation during her esophageal cancer surgery, which resolved spontaneously.  She has a history of pacemaker insertion, initially performed by Dr. Cleotilde. The first pacemaker had complications as the lead fell out and caused a lung injury, requiring a two to three-hour procedure and a ten-day hospital stay in July. Dr. Waddell subsequently replaced the pacemaker, which took about two hours to complete. The current pacemaker is functioning well.  Her medication regimen includes simvastatin  and pantoprazole . She does not regularly check her blood sugars, although they were elevated during a hospital stay, requiring treatment once.  In terms of her ophthalmologic history, she is receiving injections in both eyes for macular issues, with the left eye experiencing bleeding. Dr. Josh manages her macular condition, while Dr. Marcey monitors her glaucoma. She is currently maintaining her vision well enough to drive.  She recently lost her 73 year old dog, which has been emotionally challenging. She is considering getting another dog, especially for companionship during her travels to [place] . She is considering traveling  to [place]  for two weeks and prefers driving over flying.    Past Medical History:  Diagnosis Date   Breast cancer (HCC) 12/2018   right DCIS   Dysphagia    Esophageal cancer (HCC)    GERD (gastroesophageal reflux disease)    Hyperlipemia    Hypertension    no meds now   Impaired fasting blood sugar    Personal history of radiation therapy    PONV (postoperative nausea and vomiting)    Pre-diabetes     Past Surgical History:  Procedure Laterality Date   ATRIAL FIBRILLATION ABLATION N/A 05/29/2024   Procedure: ATRIAL FIBRILLATION ABLATION;  Surgeon: Nancey Eulas BRAVO, MD;  [facility]: [facility];  Service: Cardiovascular;  Laterality: N/A;   BREAST BIOPSY Right 12/23/2018   BREAST LUMPECTOMY Right 01/18/2019   BREAST LUMPECTOMY WITH RADIOACTIVE SEED LOCALIZATION Right 01/18/2019   Procedure: RIGHT BREAST CENTRAL LUMPECTOMY WITH RADIOACTIVE SEED LOCALIZATION;  Surgeon: Ebbie Cough, MD;  Location: [facility];  Service: General;  Laterality: Right;   DIRECT LARYNGOSCOPY WITH BOTOX INJECTION     tx paralysed vocal cord   ESOPHAGOGASTRODUODENOSCOPY  11/17/2011   Procedure: ESOPHAGOGASTRODUODENOSCOPY (EGD);  Surgeon: Toribio Cedar, MD;  Location: [facility];  Service: Endoscopy;  Laterality: N/A;   ESOPHAGUS SURGERY     EYE SURGERY     both cataracts   GANGLION CYST EXCISION Left 01/26/2013   Procedure: LEFT FLEXOR CARPI RADIALIS  RELEASE AND SCAPHO TRAPEZIAL TRAPEZOID DEBRIDEMENT ;  Surgeon: Franky JONELLE Curia, MD;  Location: [facility];  Service: Orthopedics;  Laterality: Left;   LEAD REVISION/REPAIR N/A 11/30/2023   Procedure: LEAD REVISION/REPAIR;  Surgeon: Waddell Danelle ORN,  MD;  Location: [facility];  Service: Cardiovascular;  Laterality: N/A;   LEAD REVISION/REPAIR N/A 11/26/2023   Procedure: LEAD REVISION/REPAIR;  Surgeon: Inocencio Soyla Lunger, MD;  Location: [facility];  Service: Cardiovascular;  Laterality: N/A;   PACEMAKER  IMPLANT N/A 09/24/2023   Procedure: PACEMAKER IMPLANT;  Surgeon: Nancey Eulas BRAVO, MD;  Location: [facility];  Service: Cardiovascular;  Laterality: N/A;    Family History  Problem Relation Age of Onset   Alcohol  abuse Sister    AAA (abdominal aortic aneurysm) Mother    COPD Father    Colon cancer Neg Hx     Social History   Socioeconomic History   Marital status: Widowed    Spouse name: Christopher   Number of children: 0   Years of education: 18   Highest education level: Not on file  Occupational History   Occupation: RETIRED     Employer: OTHER  Tobacco Use   Smoking status: Former    Current packs/day: 0.00    Types: Cigarettes    Quit date: 08/30/1987    Years since quitting: 36.9   Smokeless tobacco: Never  Substance and Sexual Activity   Alcohol  use: No   Drug use: Never   Sexual activity: Yes    Partners: Male    Birth control/protection: Post-menopausal  Other Topics Concern   Not on file  Social History Narrative   Marital Status:  Married Astrid)   Children:  None    Pets: Dog (1)    Living Situation: Lives with husband.   Occupation:  Retired Loss adjuster, chartered)    Education: Master's Degree    Tobacco Use/Exposure:  Formal Smoker    Alcohol  Use:  Occasional   Drug Use:  None   Diet:  Regular   Exercise:  Walking 1 mile 5 times per week.    Hobbies:  Reading                Social Drivers of Health   Financial Resource Strain: Low Risk  (01/13/2024)   Overall Financial Resource Strain (CARDIA)    Difficulty of Paying Living Expenses: Not hard at all  Food Insecurity: No Food Insecurity (01/13/2024)   Hunger Vital Sign    Worried About Running Out of Food in the Last Year: Never true    Ran Out of Food in the Last Year: Never true  Transportation Needs: No Transportation Needs (01/13/2024)   PRAPARE - Administrator, Civil Service (Medical): No    Lack of Transportation (Non-Medical): No  Physical Activity: Inactive (01/13/2024)    Exercise Vital Sign    Days of Exercise per Week: 0 days    Minutes of Exercise per Session: 0 min  Stress: No Stress Concern Present (01/13/2024)   [employer] of Occupational Health - Occupational Stress Questionnaire    Feeling of Stress : Not at all  Social Connections: Moderately Integrated (01/13/2024)   Social Connection and Isolation Panel    Frequency of Communication with Friends and Family: More than three times a week    Frequency of Social Gatherings with Friends and Family: More than three times a week    Attends Religious Services: More than 4 times per year    Active Member of [employer] or Organizations: Yes    Attends Banker Meetings: More than 4 times per year    Marital Status: Widowed  Recent Concern: Social Connections - Socially Isolated (11/26/2023)   Social Connection and  Isolation Panel    Frequency of Communication with Friends and Family: More than three times a week    Frequency of Social Gatherings with Friends and Family: More than three times a week    Attends Religious Services: Never    Database administrator or Organizations: No    Attends Banker Meetings: Never    Marital Status: Widowed  Intimate Partner Violence: Not At Risk (01/13/2024)   Humiliation, Afraid, Rape, and Kick questionnaire    Fear of Current or Ex-Partner: No    Emotionally Abused: No    Physically Abused: No    Sexually Abused: No    Outpatient Medications Prior to Visit  Medication Sig Dispense Refill   Accu-Chek Softclix Lancets lancets Use to check blood sugar once a day.  DX  E11.9 100 each 1   Alcohol  Swabs  (ALCOHOL  WIPES) 70 % PADS To use to check sugars 100 each 1   apixaban  (ELIQUIS ) 5 MG TABS tablet TAKE 1 TABLET(5 MG) BY MOUTH TWICE DAILY 60 tablet 5   apixaban  (ELIQUIS ) 5 MG TABS tablet Take 5 mg by mouth in the morning, at noon, and at bedtime. 2 tablets in the morning, 1 tablet in the evening     Blood Glucose Calibration (ACCU-CHEK AVIVA)  SOLN Use to check control when opening a new bottle of strips.  DX E11.9 1 each 1   Blood Glucose Monitoring Suppl (ACCU-CHEK AVIVA PLUS) w/Device KIT Use to check blood sugar once a day.  DX E11.9 1 kit 0   Blood Glucose Monitoring Suppl (TRUE METRIX AIR GLUCOSE METER) DEVI Use as directed to check glucose twice daily. E11.9 1 each 0   Blood Glucose Monitoring Suppl DEVI 1 each by Does not apply route in the morning, at noon, and at bedtime. May substitute to any manufacturer covered by patient's insurance. 1 each 0   carvedilol  (COREG ) 12.5 MG tablet Take 25mg  (2 tablets) in the morning and 12.5mg  (1 tablet) in the evening 90 tablet 6   cetirizine (ZYRTEC) 10 MG tablet Take 10 mg by mouth at bedtime as needed for allergies.     Coenzyme Q10 (CO Q 10 PO) Take 1 capsule by mouth at bedtime. Gel capsule     famotidine  (PEPCID ) 40 MG tablet TAKE 1 TABLET AT BEDTIME 90 tablet 1   Glucos-Chondroit-Hyaluron-MSM (GLUCOSAMINE CHONDROITIN JOINT PO) Take 1 tablet by mouth daily.     glucose blood (ACCU-CHEK AVIVA PLUS) test strip Use to check blood sugar once daily.  DX  E 11.9 100 strip 1   KRILL OIL PO Take 1 capsule by mouth daily.     latanoprost  (XALATAN ) 0.005 % ophthalmic solution Place 1 drop into both eyes at bedtime.     Multiple Vitamins-Minerals (MULTIVITAMIN PO) Take 1 tablet by mouth daily. Centrum silver 50+     Multiple Vitamins-Minerals (PRESERVISION AREDS 2 PO) Take 1 tablet by mouth in the morning and at bedtime.     pantoprazole  (PROTONIX ) 40 MG tablet Take 1 tablet (40 mg total) by mouth daily. 90 tablet 0   TRUEplus Lancets 33G MISC Use as directed. Check glucose up to twice daily. E11.9 100 each 11   simvastatin  (ZOCOR ) 20 MG tablet Take 1 tablet (20 mg total) by mouth daily at 6 PM. 90 tablet 0   Facility-Administered Medications Prior to Visit  Medication Dose Route Frequency Provider Last Rate Last Admin   iohexol  (OMNIPAQUE ) 350 MG/ML injection    PRN Camnitz, Soyla Lunger, MD  30 mL at 11/26/23 1800    Allergies  Allergen Reactions   Taxol [Paclitaxel] Hives and Itching   Tramadol  Other (See Comments)    Sweaty, shaky and faint   Carboplatin Hives and Itching   Codeine Other (See Comments)    Could not sleep, Shaky and Sweaty   Compazine  [Prochlorperazine Maleate] Other (See Comments)    Severe chest pain   Penicillins Other (See Comments)    Childhood    Review of Systems  Constitutional:  Negative for fever and malaise/fatigue.  HENT:  Negative for congestion.   Eyes:  Negative for blurred vision.  Respiratory:  Negative for cough and shortness of breath.   Cardiovascular:  Negative for chest pain, palpitations and leg swelling.  Gastrointestinal:  Negative for vomiting.  Musculoskeletal:  Negative for back pain.  Skin:  Negative for rash.  Neurological:  Negative for loss of consciousness and headaches.       Objective:    Physical Exam Vitals and nursing note reviewed.  Constitutional:      General: She is not in acute distress.    Appearance: Normal appearance. She is well-developed.  HENT:     Head: Normocephalic and atraumatic.  Eyes:     General: No scleral icterus.       Right eye: No discharge.        Left eye: No discharge.  Cardiovascular:     Rate and Rhythm: Normal rate and regular rhythm.     Heart sounds: No murmur heard. Pulmonary:     Effort: Pulmonary effort is normal. No respiratory distress.     Breath sounds: Normal breath sounds.  Musculoskeletal:        General: Normal range of motion.     Cervical back: Normal range of motion and neck supple.     Right lower leg: No edema.     Left lower leg: No edema.  Skin:    General: Skin is warm and dry.  Neurological:     Mental Status: She is alert and oriented to person, place, and time.  Psychiatric:        Mood and Affect: Mood normal.        Behavior: Behavior normal.        Thought Content: Thought content normal.        Judgment: Judgment normal.      BP (!) 140/88 (BP Location: Left Arm, Patient Position: Sitting, Cuff Size: Normal)   Pulse 68   Temp 98 F (36.7 C) (Oral)   Resp 18   Ht 5' 0.2 (1.529 m)   Wt 113 lb 6.4 oz (51.4 kg)   SpO2 94%   BMI 22.00 kg/m  Wt Readings from Last 3 Encounters:  07/20/24 113 lb 6.4 oz (51.4 kg)  06/26/24 111 lb 12.8 oz (50.7 kg)  05/29/24 107 lb (48.5 kg)    Diabetic Foot Exam - Simple   No data filed    Lab Results  Component Value Date   WBC 5.3 07/13/2024   HGB 11.8 (L) 07/13/2024   HCT 35.5 (L) 07/13/2024   PLT 219.0 07/13/2024   GLUCOSE 121 (H) 07/13/2024   CHOL 181 07/13/2024   TRIG 108 07/13/2024   HDL 78 07/13/2024   LDLCALC 83 07/13/2024   ALT 17 07/13/2024   AST 22 07/13/2024   NA 140 07/13/2024   K 4.4 07/13/2024   CL 104 07/13/2024   CREATININE 0.94 07/13/2024   BUN 15 07/13/2024   CO2 28  07/13/2024   TSH 1.66 07/16/2023   HGBA1C 7.2 (H) 07/13/2024   MICROALBUR 1.2 07/13/2024    Lab Results  Component Value Date   TSH 1.66 07/16/2023   Lab Results  Component Value Date   WBC 5.3 07/13/2024   HGB 11.8 (L) 07/13/2024   HCT 35.5 (L) 07/13/2024   MCV 91.2 07/13/2024   PLT 219.0 07/13/2024   Lab Results  Component Value Date   NA 140 07/13/2024   K 4.4 07/13/2024   CO2 28 07/13/2024   GLUCOSE 121 (H) 07/13/2024   BUN 15 07/13/2024   CREATININE 0.94 07/13/2024   BILITOT 0.6 07/13/2024   ALKPHOS 52 07/13/2024   AST 22 07/13/2024   ALT 17 07/13/2024   PROT 7.2 07/13/2024   ALBUMIN 4.1 07/13/2024   CALCIUM 9.6 07/13/2024   ANIONGAP 10 12/02/2023   EGFR 61 05/04/2024   GFR 57.61 (L) 07/13/2024   Lab Results  Component Value Date   CHOL 181 07/13/2024   Lab Results  Component Value Date   HDL 78 07/13/2024   Lab Results  Component Value Date   LDLCALC 83 07/13/2024   Lab Results  Component Value Date   TRIG 108 07/13/2024   Lab Results  Component Value Date   CHOLHDL 2.3 07/13/2024   Lab Results  Component Value Date    HGBA1C 7.2 (H) 07/13/2024       Assessment & Plan:  Presence of heart assist device (HCC)  Paroxysmal atrial fibrillation (HCC) Assessment & Plan: Per cardiology--- pt has pacemaker   Hyperlipidemia Assessment & Plan: Encourage heart healthy diet such as MIND or DASH diet, increase exercise, avoid trans fats, simple carbohydrates and processed foods, consider a krill or fish or flaxseed oil cap daily.    Orders: -     Simvastatin ; Take 1 tablet (20 mg total) by mouth daily at 6 PM.  Dispense: 90 tablet; Refill: 1  Hyperlipidemia associated with type 2 diabetes mellitus (HCC) Assessment & Plan: Tolerating statin, encouraged heart healthy diet, avoid trans fats, minimize simple carbs and saturated fats. Increase exercise as tolerated    Diet-controlled diabetes mellitus (HCC) Assessment & Plan: hgba1c to be checked, minimize simple carbs. Increase exercise as tolerated. Continue current meds    Gastroesophageal reflux disease, unspecified whether esophagitis present  Need for influenza vaccination -     Flu vaccine HIGH DOSE PF(Fluzone  Trivalent)  Paget's disease of right breast [facility]) Assessment & Plan: Stable    Palpitation Assessment & Plan: resolved   Ductal carcinoma in situ (DCIS) of right breast Assessment & Plan: Pt prefers to no longer do screenings    Assessment and Plan Assessment & Plan Paroxysmal atrial fibrillation, post-ablation, under surveillance with cardiac pacemaker   Paroxysmal atrial fibrillation status post-ablation, with no AFib detected at last follow-up. She is under surveillance with a cardiac pacemaker. Further evaluation on October 27th will determine if AFib is resolved and if discontinuation of Eliquis  is possible. Continue surveillance and evaluate AFib status on October 27th.  Age-related macular degeneration, bilateral, with intravitreal injections   Bilateral age-related macular degeneration is being managed with intravitreal  injections. The left eye has progressed to wet AMD and requires ongoing injections, while the right eye remains stable without bleeding. She is under the care of Dr. May Atkinson for retinal monitoring and treatment. Monitor vision changes and follow up with the ophthalmologist as needed.  Glaucoma, under ophthalmologic care   Glaucoma is managed under the care of Dr. Marcey. Continue regular  ophthalmologic follow-up with Dr. Marcey.  Hyperlipidemia   Hyperlipidemia is managed with simvastatin . Refill simvastatin  prescription as needed.  Esophageal cancer, status post-surgery   Esophageal cancer is status post-surgery.  General Health Maintenance   She received the flu shot today after discussing flu and RSV vaccinations. RSV and COVID vaccinations are available at the pharmacy for consideration.    Sanav Remer R Lowne Chase, DO

## 2024-07-20 NOTE — Assessment & Plan Note (Signed)
 Tolerating statin, encouraged heart healthy diet, avoid trans fats, minimize simple carbs and saturated fats. Increase exercise as tolerated

## 2024-07-20 NOTE — Assessment & Plan Note (Signed)
 Encourage heart healthy diet such as MIND or DASH diet, increase exercise, avoid trans fats, simple carbohydrates and processed foods, consider a krill or fish or flaxseed oil cap daily.

## 2024-07-22 ENCOUNTER — Ambulatory Visit: Payer: Self-pay | Admitting: Cardiovascular Disease

## 2024-07-24 NOTE — Assessment & Plan Note (Signed)
 Pt prefers to no longer do screenings

## 2024-07-24 NOTE — Progress Notes (Signed)
 Remote PPM Transmission

## 2024-07-24 NOTE — Assessment & Plan Note (Signed)
 Stable

## 2024-08-18 DIAGNOSIS — H26492 Other secondary cataract, left eye: Secondary | ICD-10-CM | POA: Diagnosis not present

## 2024-08-18 DIAGNOSIS — H401131 Primary open-angle glaucoma, bilateral, mild stage: Secondary | ICD-10-CM | POA: Diagnosis not present

## 2024-08-18 DIAGNOSIS — H353231 Exudative age-related macular degeneration, bilateral, with active choroidal neovascularization: Secondary | ICD-10-CM | POA: Diagnosis not present

## 2024-08-24 NOTE — Progress Notes (Signed)
 Electrophysiology Office Note:   Date:  08/28/2024  ID:  Briana Craig, DOB [birth year], MRN [id number]  Primary Cardiologist: None Primary Heart Failure: None Electrophysiologist: Eulas FORBES Furbish, MD       History of Present Illness:   Briana Craig is a 78 y.o. female with h/o AF, CHB s/p PPM, HTN, HLD, esophageal CA s/p esophagectomy, R breast CA seen today for routine electrophysiology follow-up s/p Ablation.  Since last being seen in our clinic the patient reports she feels she is doing much better. She asks about getting off anticoagulation and LAAO/Watchman.  Reports no AF burden    She denies chest pain, palpitations, dyspnea, PND, orthopnea, nausea, vomiting, dizziness, syncope, edema, weight gain, or early satiety.    Review of systems complete and found to be negative unless listed in HPI.   EP Information / Studies Reviewed:    EKG is ordered today. Personal review as below.  EKG Interpretation Date/Time:  Monday August 28 2024 14:07:13 EDT Ventricular Rate:  63 PR Interval:  178 QRS Duration:  122 QT Interval:  462 QTC Calculation: 472 R Axis:   269  Text Interpretation: Atrial-sensed ventricular-paced rhythm Confirmed by Aniceto Jarvis ([id number]) on 08/28/2024 2:29:29 PM   PPM Interrogation-  reviewed in detail today,  See PACEART report.  Device History: Medtronic Dual Chamber PPM implanted 11/30/23 for CHB   Arrhythmia / AAD / Pertinent EP Studies AF     Risk Assessment/Calculations:    CHA2DS2-VASc Score = 5   This indicates a 7.2% annual risk of stroke. The patient's score is based upon: CHF History: 0 HTN History: 1 Diabetes History: 1 Stroke History: 0 Vascular Disease History: 0 Age Score: 2 Gender Score: 1             Physical Exam:   VS:  BP 128/62   Pulse 63   Ht 5' 2 (1.575 m)   Wt 115 lb 12.8 oz (52.5 kg)   SpO2 93%   BMI 21.18 kg/m    Wt Readings from Last 3 Encounters:  08/28/24 115 lb 12.8 oz (52.5 kg)  07/20/24  113 lb 6.4 oz (51.4 kg)  06/26/24 111 lb 12.8 oz (50.7 kg)     GEN: Well nourished, well developed in no acute distress NECK: No JVD; No carotid bruits CARDIAC: Regular rate and rhythm, no murmurs, rubs, gallops RESPIRATORY:  Clear to auscultation without rales, wheezing or rhonchi  ABDOMEN: Soft, non-tender, non-distended EXTREMITIES:  No edema; No deformity   ASSESSMENT AND PLAN:    CHB s/p Medtronic PPM  -Normal PPM function -See Pace Art report -No changes today  Paroxysmal Atrial Fibrillation  CHA2DS2-VASc 5, s/p PVI + PW ablation 05/2024 -OAC for stroke prophylaxis  -continue coreg  12.5 mg BID  -no AF burden post ablation   -no AF burden on device post ablation   Secondary Hypercoagulable State  -continue Eliquis  5mg  BID, dose reviewed and appropriate by age / Cr -reviewed risk score with patient and need for OAC > LAAO procedure discussed and information provided  Elevated CAC  HTN HLD -CAC 584 / 84th percentile for matched controls   Tree-in-Bud & Centrilobular Nodules  Trace Left Pleural Effusion  Hx Esophageal CA s/p Esophagectomy with Gastric Pull Through, DCIS -follows with Pulmonary > Dr. Shelah -encouraged pt to call and schedule follow up      Disposition:   Follow up with Dr. Furbish / EP APP in 6 months post ablation   Signed, Jarvis Aniceto, NP-C, AGACNP-BC  [facility] - Electrophysiology  08/28/2024, 5:31 PM

## 2024-08-28 ENCOUNTER — Encounter: Payer: Self-pay | Admitting: Pulmonary Disease

## 2024-08-28 ENCOUNTER — Ambulatory Visit: Attending: Pulmonary Disease | Admitting: Pulmonary Disease

## 2024-08-28 VITALS — BP 128/62 | HR 63 | Ht 62.0 in | Wt 115.8 lb

## 2024-08-28 DIAGNOSIS — D6869 Other thrombophilia: Secondary | ICD-10-CM | POA: Diagnosis not present

## 2024-08-28 DIAGNOSIS — I480 Paroxysmal atrial fibrillation: Secondary | ICD-10-CM

## 2024-08-28 DIAGNOSIS — I442 Atrioventricular block, complete: Secondary | ICD-10-CM

## 2024-08-28 DIAGNOSIS — Z950 Presence of cardiac pacemaker: Secondary | ICD-10-CM

## 2024-08-28 LAB — CUP PACEART INCLINIC DEVICE CHECK
Date Time Interrogation Session: 20251027172924
Implantable Lead Connection Status: 753985
Implantable Lead Connection Status: 753985
Implantable Lead Implant Date: 20250128
Implantable Lead Implant Date: 20250128
Implantable Lead Location: 753859
Implantable Lead Location: 753860
Implantable Lead Model: 5076
Implantable Lead Model: 5076
Implantable Pulse Generator Implant Date: 20250128

## 2024-08-28 NOTE — Patient Instructions (Signed)
 Medication Instructions:  Your physician recommends that you continue on your current medications as directed. Please refer to the Current Medication list given to you today.  *If you need a refill on your cardiac medications before your next appointment, please call your pharmacy*  Lab Work: None ordered  If you have labs (blood work) drawn today and your tests are completely normal, you will receive your results only by: MyChart Message (if you have MyChart) OR A paper copy in the mail If you have any lab test that is abnormal or we need to change your treatment, we will call you to review the results.  Follow-Up: At [facility], you and your health needs are our priority.  As part of our continuing mission to provide you with exceptional heart care, our providers are all part of one team.  This team includes your primary Cardiologist (physician) and Advanced Practice Providers or APPs (Physician Assistants and Nurse Practitioners) who all work together to provide you with the care you need, when you need it.  Your next appointment:   6 month(s)  Provider:   Creighton Doffing, NP

## 2024-09-01 ENCOUNTER — Other Ambulatory Visit (HOSPITAL_BASED_OUTPATIENT_CLINIC_OR_DEPARTMENT_OTHER): Payer: Self-pay

## 2024-09-01 MED ORDER — COMIRNATY 30 MCG/0.3ML IM SUSY
0.3000 mL | PREFILLED_SYRINGE | Freq: Once | INTRAMUSCULAR | 0 refills | Status: AC
  Filled 2024-09-01: qty 0.3, 1d supply, fill #0

## 2024-09-05 DIAGNOSIS — H401132 Primary open-angle glaucoma, bilateral, moderate stage: Secondary | ICD-10-CM | POA: Diagnosis not present

## 2024-09-05 DIAGNOSIS — H43813 Vitreous degeneration, bilateral: Secondary | ICD-10-CM | POA: Diagnosis not present

## 2024-09-05 DIAGNOSIS — H353231 Exudative age-related macular degeneration, bilateral, with active choroidal neovascularization: Secondary | ICD-10-CM | POA: Diagnosis not present

## 2024-09-05 DIAGNOSIS — Z961 Presence of intraocular lens: Secondary | ICD-10-CM | POA: Diagnosis not present

## 2024-09-05 DIAGNOSIS — H35373 Puckering of macula, bilateral: Secondary | ICD-10-CM | POA: Diagnosis not present

## 2024-09-07 ENCOUNTER — Ambulatory Visit: Payer: Self-pay | Admitting: Cardiovascular Disease

## 2024-09-25 ENCOUNTER — Other Ambulatory Visit: Payer: Self-pay | Admitting: Family Medicine

## 2024-09-25 DIAGNOSIS — E785 Hyperlipidemia, unspecified: Secondary | ICD-10-CM

## 2024-09-29 ENCOUNTER — Other Ambulatory Visit: Payer: Self-pay | Admitting: Family Medicine

## 2024-09-29 DIAGNOSIS — C159 Malignant neoplasm of esophagus, unspecified: Secondary | ICD-10-CM

## 2024-10-16 ENCOUNTER — Ambulatory Visit: Payer: Medicare PPO

## 2024-10-16 DIAGNOSIS — I442 Atrioventricular block, complete: Secondary | ICD-10-CM | POA: Diagnosis not present

## 2024-10-18 LAB — CUP PACEART REMOTE DEVICE CHECK
Battery Remaining Longevity: 142 mo
Battery Voltage: 3.09 V
Brady Statistic AP VP Percent: 10.5 %
Brady Statistic AP VS Percent: 0.14 %
Brady Statistic AS VP Percent: 89.28 %
Brady Statistic AS VS Percent: 0.08 %
Brady Statistic RA Percent Paced: 10.6 %
Brady Statistic RV Percent Paced: 99.78 %
Date Time Interrogation Session: 20251214205707
Implantable Lead Connection Status: 753985
Implantable Lead Connection Status: 753985
Implantable Lead Implant Date: 20250128
Implantable Lead Implant Date: 20250128
Implantable Lead Location: 753859
Implantable Lead Location: 753860
Implantable Lead Model: 5076
Implantable Lead Model: 5076
Implantable Pulse Generator Implant Date: 20250128
Lead Channel Impedance Value: 285 Ohm
Lead Channel Impedance Value: 399 Ohm
Lead Channel Impedance Value: 418 Ohm
Lead Channel Impedance Value: 456 Ohm
Lead Channel Pacing Threshold Amplitude: 0.5 V
Lead Channel Pacing Threshold Amplitude: 0.875 V
Lead Channel Pacing Threshold Pulse Width: 0.4 ms
Lead Channel Pacing Threshold Pulse Width: 0.4 ms
Lead Channel Sensing Intrinsic Amplitude: 1.25 mV
Lead Channel Sensing Intrinsic Amplitude: 1.25 mV
Lead Channel Sensing Intrinsic Amplitude: 14.25 mV
Lead Channel Sensing Intrinsic Amplitude: 14.25 mV
Lead Channel Setting Pacing Amplitude: 1.5 V
Lead Channel Setting Pacing Amplitude: 2 V
Lead Channel Setting Pacing Pulse Width: 0.4 ms
Lead Channel Setting Sensing Sensitivity: 1.2 mV
Zone Setting Status: 755011

## 2024-10-20 NOTE — Progress Notes (Signed)
 Remote PPM Transmission

## 2024-10-25 ENCOUNTER — Other Ambulatory Visit: Payer: Self-pay | Admitting: Family Medicine

## 2024-10-25 DIAGNOSIS — K219 Gastro-esophageal reflux disease without esophagitis: Secondary | ICD-10-CM

## 2024-11-01 ENCOUNTER — Ambulatory Visit: Payer: Self-pay | Admitting: Cardiovascular Disease

## 2024-11-06 ENCOUNTER — Telehealth: Payer: Self-pay

## 2024-11-06 NOTE — Telephone Encounter (Signed)
 Initial Comment Caller states she is experiencing a runny nose and headache. Caller states she was exposed to the Flu. Translation No Nurse Assessment Nurse: Nerissa, RN, Charleen Date/Time (Eastern Time): 11/04/2024 8:32:42 AM Confirm and document reason for call. If symptomatic, describe symptoms. ---Caller states she was exposed to the flu on new year's eve and is having runny nose and headache. does not have a thermometer but has been having chills. Does the patient have any new or worsening symptoms? ---Yes Will a triage be completed? ---Yes Related visit to physician within the last 2 weeks? ---No Does the PT have any chronic conditions? (i.e. diabetes, asthma, this includes High risk factors for pregnancy, etc.) ---No Is this a behavioral health or substance abuse call? ---No Guidelines Guideline Title Affirmed Question Affirmed Notes Nurse Date/Time (Eastern Time) Influenza (Flu) Exposure [1] Influenza EXPOSURE (Close Contact) within last 7 days AND [2] exposed person is HIGH RISK (e.g., 65 years and older, pregnant, HIV+, chronic medical condition) [place], RN, Charleen 11/04/2024 8:34:54 AM PLEASE NOTE: All timestamps contained within this report are represented as Eastern Standard Time. CONFIDENTIALTY NOTICE: This fax transmission is intended only for the addressee. It contains information that is legally privileged, confidential or otherwise protected from use or disclosure. If you are not the intended recipient, you are strictly prohibited from reviewing, disclosing, copying using or disseminating any of this information or taking any action in reliance on or regarding this information. If you have received this fax in error, please notify us  immediately by telephone so that we can arrange for its return to us . Phone: [phone], Toll-Free: [phone], Fax: [phone] DEANNA_[email or web address] April 13, 1946 Page: 1 of2 CallId: [id number] Disp. Time Titus Time) Disposition Final  User 11/04/2024 8:17:42 AM Attempt made - line busy Nerissa, RN, Charleen 11/04/2024 8:38:07 AM Call PCP when Office is Open Yes Nerissa, RN, Charleen Final Disposition 11/04/2024 8:38:07 AM Call PCP when Office is Open Yes Nerissa, RN, Charleen [place] Disagree/Comply Comply Caller Understands Yes PreDisposition Home Care Care Advice Given Per Guideline CALL PCP WHEN OFFICE IS OPEN: * You need to discuss this with your doctor (or NP/PA) within the next few days. * Call the office when it is open. REASSURANCE AND EDUCATION - EXPOSED TO INFLUENZA AND NO SYMPTOMS: * Although you were exposed to flu (influenza), you do not have any symptoms. * Symptoms usually develop within 1 to 4 days of exposure to another person with flu (7 days is an outer limit). * There are some things that you can do to help prevent getting flu. * Here is some care advice that should help. * Influenza is a respiratory illness that easily spreads from person-to-person. * Influenza is commonly known as the 'flu'. INFLUENZA - KEY POINTS: CALL BACK IF: * You have more questions CARE ADVICE given per Influenza (Flu) Exposure (Adult) guideline. INFLUENZA - HOW IT IS SPREAD: * Influenza is very contagious. That means it can spread very easily from person to person. * The flu virus is spread by AIRBORNE DROPLETS, from sneezing, coughing, and even talking. A person catches the flu when droplets with the flu virus get in the mouth, nose or eyes. This happens by breathing air with the flu virus or touching the face after touching a surface dirty with flu virus. * It is possible to spread the virus from 1 day before up to 7 days after symptoms first appear. * Symptoms usually start within 1 to 4 days after being exposed to a person with the flu. If more  than 7 days pass from exposure without developing symptoms, you should be safe and not get the flu.

## 2024-11-08 NOTE — Telephone Encounter (Unsigned)
 Copied from CRM [id number]. Topic: General - Other >> Nov 08, 2024 11:31 AM Jasmin G wrote: Reason for CRM: Pt called regarding recent missed call from Ms. Elouise Powell HERO, CMA, I relayed info and pt states that she took otc flu tests twice and both came out negative.

## 2024-11-08 NOTE — Telephone Encounter (Signed)
 Pt called. LVM to see how patient was feeling and if she has taken otc flu test or needed an appointment

## 2024-11-09 ENCOUNTER — Other Ambulatory Visit: Payer: Self-pay | Admitting: Family Medicine

## 2024-11-09 DIAGNOSIS — Z20828 Contact with and (suspected) exposure to other viral communicable diseases: Secondary | ICD-10-CM

## 2024-11-09 MED ORDER — OSELTAMIVIR PHOSPHATE 75 MG PO CAPS: 75.0000 mg | ORAL_CAPSULE | Freq: Two times a day (BID) | ORAL | 0 refills | Status: AC

## 2024-11-10 NOTE — Telephone Encounter (Signed)
 [place], [place] A   11/10/2024 11:50 AM  Patient returning call from office. Relayed note that Tamiflu  was sent in. Patient would still like a call back as she would like to know the effect this medication would have on her if she is taking it but not symptomatic, patient stated she only has a runny nose.

## 2024-11-10 NOTE — Telephone Encounter (Signed)
 Left detailed message on machine about note below and advised to to call back with any questions.

## 2024-11-10 NOTE — Telephone Encounter (Signed)
 Spoke w/ Pt- states she doesn't understand why the Tamiflu  was sent in, that she called 2 weeks ago regarding this. Informed Pt that we received an after hours call on 11/04/24 at 8:32am stating she was having a headache and runny nose and that she had been exposed to the flu, Pt still adamant that she called before then, but informed her that if she prefers not to take Tamiflu  she doesn't have to. Pt is going to opt not to take it.

## 2024-11-12 ENCOUNTER — Other Ambulatory Visit: Payer: Self-pay | Admitting: Cardiovascular Disease

## 2024-11-12 DIAGNOSIS — I480 Paroxysmal atrial fibrillation: Secondary | ICD-10-CM

## 2024-12-05 ENCOUNTER — Other Ambulatory Visit: Payer: Self-pay

## 2024-12-05 ENCOUNTER — Emergency Department (HOSPITAL_BASED_OUTPATIENT_CLINIC_OR_DEPARTMENT_OTHER)

## 2024-12-05 ENCOUNTER — Emergency Department (HOSPITAL_BASED_OUTPATIENT_CLINIC_OR_DEPARTMENT_OTHER)
Admission: EM | Admit: 2024-12-05 | Discharge: 2024-12-05 | Disposition: A | Discharge status: Home / Self Care | Attending: Emergency Medicine | Admitting: Emergency Medicine

## 2024-12-05 ENCOUNTER — Encounter (HOSPITAL_BASED_OUTPATIENT_CLINIC_OR_DEPARTMENT_OTHER): Payer: Self-pay

## 2024-12-05 DIAGNOSIS — K8020 Calculus of gallbladder without cholecystitis without obstruction: Secondary | ICD-10-CM | POA: Insufficient documentation

## 2024-12-05 DIAGNOSIS — R030 Elevated blood-pressure reading, without diagnosis of hypertension: Secondary | ICD-10-CM | POA: Insufficient documentation

## 2024-12-05 DIAGNOSIS — Z8501 Personal history of malignant neoplasm of esophagus: Secondary | ICD-10-CM | POA: Insufficient documentation

## 2024-12-05 DIAGNOSIS — B029 Zoster without complications: Secondary | ICD-10-CM | POA: Insufficient documentation

## 2024-12-05 DIAGNOSIS — Z7901 Long term (current) use of anticoagulants: Secondary | ICD-10-CM | POA: Insufficient documentation

## 2024-12-05 LAB — COMPREHENSIVE METABOLIC PANEL WITH GFR
ALT: 16 U/L (ref 0–44)
AST: 23 U/L (ref 15–41)
Albumin: 4.4 g/dL (ref 3.5–5.0)
Alkaline Phosphatase: 65 U/L (ref 38–126)
Anion gap: 11 (ref 5–15)
BUN: 11 mg/dL (ref 8–23)
CO2: 28 mmol/L (ref 22–32)
Calcium: 9.9 mg/dL (ref 8.9–10.3)
Chloride: 101 mmol/L (ref 98–111)
Creatinine, Ser: 0.92 mg/dL (ref 0.44–1.00)
GFR, Estimated: >60 mL/min
Glucose, Bld: 153 mg/dL — ABNORMAL HIGH (ref 70–99)
Potassium: 4.3 mmol/L (ref 3.5–5.1)
Sodium: 140 mmol/L (ref 135–145)
Total Bilirubin: 0.6 mg/dL (ref 0.0–1.2)
Total Protein: 8 g/dL (ref 6.5–8.1)

## 2024-12-05 LAB — CBC
HCT: 38.7 % (ref 36.0–46.0)
Hemoglobin: 13.1 g/dL (ref 12.0–15.0)
MCH: 30.5 pg (ref 26.0–34.0)
MCHC: 33.9 g/dL (ref 30.0–36.0)
MCV: 90 fL (ref 80.0–100.0)
Platelets: 210 10*3/uL (ref 150–400)
RBC: 4.3 MIL/uL (ref 3.87–5.11)
RDW: 14.3 % (ref 11.5–15.5)
WBC: 6.4 10*3/uL (ref 4.0–10.5)
nRBC: 0 % (ref 0.0–0.2)

## 2024-12-05 LAB — LIPASE, BLOOD: Lipase: 37 U/L (ref 11–51)

## 2024-12-05 MED ORDER — ACETAMINOPHEN 325 MG PO TABS
650.0000 mg | ORAL_TABLET | Freq: Once | ORAL | Status: AC
  Administered 2024-12-05: 650 mg via ORAL
  Filled 2024-12-05: qty 2

## 2024-12-05 MED ORDER — HYDROCODONE-ACETAMINOPHEN 5-325 MG PO TABS: 0.5000 | ORAL_TABLET | Freq: Four times a day (QID) | ORAL | 0 refills | Status: AC | PRN

## 2024-12-05 MED ORDER — VALACYCLOVIR HCL 500 MG PO TABS
1000.0000 mg | ORAL_TABLET | Freq: Once | ORAL | Status: AC
  Administered 2024-12-05: 1000 mg via ORAL
  Filled 2024-12-05: qty 2

## 2024-12-05 MED ORDER — VALACYCLOVIR HCL 1 G PO TABS: 1000.0000 mg | ORAL_TABLET | Freq: Two times a day (BID) | ORAL | 0 refills | Status: AC

## 2024-12-06 ENCOUNTER — Telehealth: Payer: Self-pay

## 2024-12-06 NOTE — Telephone Encounter (Signed)
 Copied from CRM [id number]. Topic: Clinical - Medication Question >> Dec 06, 2024  9:14 AM Macario HERO wrote: Reason for CRM: Patient called said she went to urgent care regarding shingles and she wants to go over previous medication and new medication to make sure she is taking them correctly. Advise she will receive a call before the end of the day.

## 2024-12-06 NOTE — Telephone Encounter (Signed)
 Spoke with patient. Pt would like to know if she needs to take both Protonix  and famotidine ? Pt states she doesn't like that she's taking so much medication. Pt advises taking famotidine  in the morning and the Protonix  in the evening. Pt would like to know if there is combo pill or if the Protonix  is more beneficial. Please advise

## 2024-12-07 NOTE — Telephone Encounter (Signed)
Pt made aware. Pt verbalized understanding.

## 2025-01-12 ENCOUNTER — Other Ambulatory Visit

## 2025-01-18 ENCOUNTER — Ambulatory Visit

## 2025-01-19 ENCOUNTER — Ambulatory Visit: Admitting: Family Medicine

## 2025-02-26 ENCOUNTER — Ambulatory Visit: Admitting: Pulmonary Disease
